# Patient Record
Sex: Female | Born: 1950 | Race: White | Hispanic: No | Smoking: Never smoker
Health system: Southern US, Community
[De-identification: ages and names within clinical notes are randomized; demographics above are authoritative.]

## PROBLEM LIST (undated history)

## (undated) DIAGNOSIS — I10 Essential (primary) hypertension: Secondary | ICD-10-CM

## (undated) DIAGNOSIS — E119 Type 2 diabetes mellitus without complications: Secondary | ICD-10-CM

## (undated) DIAGNOSIS — I639 Cerebral infarction, unspecified: Secondary | ICD-10-CM

## (undated) DIAGNOSIS — I499 Cardiac arrhythmia, unspecified: Secondary | ICD-10-CM

## (undated) DIAGNOSIS — D649 Anemia, unspecified: Secondary | ICD-10-CM

## (undated) DIAGNOSIS — N183 Chronic kidney disease, stage 3 unspecified: Secondary | ICD-10-CM

## (undated) DIAGNOSIS — F32A Depression, unspecified: Secondary | ICD-10-CM

## (undated) DIAGNOSIS — I4891 Unspecified atrial fibrillation: Secondary | ICD-10-CM

## (undated) DIAGNOSIS — K2270 Barrett's esophagus without dysplasia: Secondary | ICD-10-CM

## (undated) DIAGNOSIS — F419 Anxiety disorder, unspecified: Secondary | ICD-10-CM

## (undated) DIAGNOSIS — K7460 Unspecified cirrhosis of liver: Secondary | ICD-10-CM

## (undated) HISTORY — PX: TONSILLECTOMY: SUR1361

## (undated) HISTORY — PX: INTRAOCULAR LENS INSERTION: SHX110

## (undated) HISTORY — PX: TUBAL LIGATION: SHX77

## (undated) HISTORY — PX: EYE SURGERY: SHX253

## (undated) HISTORY — DX: Cerebral infarction, unspecified: I63.9

---

## 2008-01-08 ENCOUNTER — Other Ambulatory Visit: Admission: RE | Admit: 2008-01-08 | Discharge: 2008-01-08 | Payer: Self-pay | Admitting: Family Medicine

## 2008-05-03 ENCOUNTER — Other Ambulatory Visit: Admission: RE | Admit: 2008-05-03 | Discharge: 2008-05-03 | Payer: Self-pay | Admitting: Gynecology

## 2008-08-09 ENCOUNTER — Other Ambulatory Visit: Admission: RE | Admit: 2008-08-09 | Discharge: 2008-08-09 | Payer: Self-pay | Admitting: Gynecology

## 2008-08-09 ENCOUNTER — Encounter: Payer: Self-pay | Admitting: Gynecology

## 2008-08-09 ENCOUNTER — Ambulatory Visit: Payer: Self-pay | Admitting: Gynecology

## 2009-09-29 ENCOUNTER — Other Ambulatory Visit: Admission: RE | Admit: 2009-09-29 | Discharge: 2009-09-29 | Payer: Self-pay | Admitting: Family Medicine

## 2010-03-16 ENCOUNTER — Other Ambulatory Visit: Admission: RE | Admit: 2010-03-16 | Discharge: 2010-03-16 | Payer: Self-pay | Admitting: Family Medicine

## 2010-10-06 ENCOUNTER — Other Ambulatory Visit
Admission: RE | Admit: 2010-10-06 | Discharge: 2010-10-06 | Payer: Self-pay | Source: Home / Self Care | Admitting: Family Medicine

## 2013-05-15 ENCOUNTER — Other Ambulatory Visit (HOSPITAL_COMMUNITY)
Admission: RE | Admit: 2013-05-15 | Discharge: 2013-05-15 | Disposition: A | Discharge status: Home / Self Care | Payer: BC Managed Care – PPO | Source: Ambulatory Visit | Attending: Family Medicine | Admitting: Family Medicine

## 2013-05-15 ENCOUNTER — Other Ambulatory Visit: Payer: Self-pay | Admitting: Family Medicine

## 2013-05-15 DIAGNOSIS — Z1151 Encounter for screening for human papillomavirus (HPV): Secondary | ICD-10-CM | POA: Insufficient documentation

## 2013-05-15 DIAGNOSIS — Z124 Encounter for screening for malignant neoplasm of cervix: Secondary | ICD-10-CM | POA: Insufficient documentation

## 2014-10-11 ENCOUNTER — Other Ambulatory Visit: Payer: Self-pay | Admitting: Gastroenterology

## 2018-01-23 DIAGNOSIS — M1A9XX1 Chronic gout, unspecified, with tophus (tophi): Secondary | ICD-10-CM | POA: Insufficient documentation

## 2018-01-23 DIAGNOSIS — IMO0002 Reserved for concepts with insufficient information to code with codable children: Secondary | ICD-10-CM | POA: Insufficient documentation

## 2018-01-25 ENCOUNTER — Encounter (HOSPITAL_COMMUNITY): Payer: Self-pay | Admitting: Family Medicine

## 2018-01-25 ENCOUNTER — Inpatient Hospital Stay (HOSPITAL_COMMUNITY)
Admission: EM | Admit: 2018-01-25 | Discharge: 2018-01-28 | DRG: 065 | Disposition: A | Discharge status: Home / Self Care | Payer: Medicare Other | Attending: Family Medicine | Admitting: Family Medicine

## 2018-01-25 ENCOUNTER — Other Ambulatory Visit: Payer: Self-pay

## 2018-01-25 ENCOUNTER — Inpatient Hospital Stay (HOSPITAL_COMMUNITY): Payer: Medicare Other

## 2018-01-25 ENCOUNTER — Emergency Department (HOSPITAL_COMMUNITY): Payer: Medicare Other

## 2018-01-25 DIAGNOSIS — I639 Cerebral infarction, unspecified: Secondary | ICD-10-CM | POA: Diagnosis present

## 2018-01-25 DIAGNOSIS — E7439 Other disorders of intestinal carbohydrate absorption: Secondary | ICD-10-CM | POA: Diagnosis present

## 2018-01-25 DIAGNOSIS — H5340 Unspecified visual field defects: Secondary | ICD-10-CM | POA: Diagnosis present

## 2018-01-25 DIAGNOSIS — R29703 NIHSS score 3: Secondary | ICD-10-CM | POA: Diagnosis present

## 2018-01-25 DIAGNOSIS — H538 Other visual disturbances: Secondary | ICD-10-CM | POA: Diagnosis present

## 2018-01-25 DIAGNOSIS — R402142 Coma scale, eyes open, spontaneous, at arrival to emergency department: Secondary | ICD-10-CM | POA: Diagnosis present

## 2018-01-25 DIAGNOSIS — I4891 Unspecified atrial fibrillation: Secondary | ICD-10-CM | POA: Diagnosis not present

## 2018-01-25 DIAGNOSIS — I480 Paroxysmal atrial fibrillation: Secondary | ICD-10-CM | POA: Diagnosis present

## 2018-01-25 DIAGNOSIS — R531 Weakness: Secondary | ICD-10-CM

## 2018-01-25 DIAGNOSIS — G8192 Hemiplegia, unspecified affecting left dominant side: Secondary | ICD-10-CM | POA: Diagnosis present

## 2018-01-25 DIAGNOSIS — I119 Hypertensive heart disease without heart failure: Secondary | ICD-10-CM | POA: Diagnosis present

## 2018-01-25 DIAGNOSIS — R402362 Coma scale, best motor response, obeys commands, at arrival to emergency department: Secondary | ICD-10-CM | POA: Diagnosis present

## 2018-01-25 DIAGNOSIS — R402252 Coma scale, best verbal response, oriented, at arrival to emergency department: Secondary | ICD-10-CM | POA: Diagnosis present

## 2018-01-25 DIAGNOSIS — I10 Essential (primary) hypertension: Secondary | ICD-10-CM | POA: Diagnosis present

## 2018-01-25 DIAGNOSIS — I672 Cerebral atherosclerosis: Secondary | ICD-10-CM | POA: Diagnosis present

## 2018-01-25 DIAGNOSIS — E876 Hypokalemia: Secondary | ICD-10-CM | POA: Diagnosis not present

## 2018-01-25 DIAGNOSIS — D649 Anemia, unspecified: Secondary | ICD-10-CM | POA: Diagnosis present

## 2018-01-25 DIAGNOSIS — E785 Hyperlipidemia, unspecified: Secondary | ICD-10-CM | POA: Diagnosis present

## 2018-01-25 DIAGNOSIS — I63431 Cerebral infarction due to embolism of right posterior cerebral artery: Principal | ICD-10-CM | POA: Diagnosis present

## 2018-01-25 DIAGNOSIS — I340 Nonrheumatic mitral (valve) insufficiency: Secondary | ICD-10-CM | POA: Diagnosis not present

## 2018-01-25 DIAGNOSIS — Z6834 Body mass index (BMI) 34.0-34.9, adult: Secondary | ICD-10-CM | POA: Diagnosis not present

## 2018-01-25 HISTORY — DX: Essential (primary) hypertension: I10

## 2018-01-25 HISTORY — DX: Cerebral infarction, unspecified: I63.9

## 2018-01-25 LAB — COMPREHENSIVE METABOLIC PANEL
ALBUMIN: 3.6 g/dL (ref 3.5–5.0)
ALT: 11 U/L — ABNORMAL LOW (ref 14–54)
ANION GAP: 12 (ref 5–15)
AST: 24 U/L (ref 15–41)
Alkaline Phosphatase: 46 U/L (ref 38–126)
BILIRUBIN TOTAL: 0.8 mg/dL (ref 0.3–1.2)
BUN: 12 mg/dL (ref 6–20)
CO2: 21 mmol/L — AB (ref 22–32)
Calcium: 9.5 mg/dL (ref 8.9–10.3)
Chloride: 109 mmol/L (ref 101–111)
Creatinine, Ser: 1.03 mg/dL — ABNORMAL HIGH (ref 0.44–1.00)
GFR calc Af Amer: >60 mL/min (ref >60)
GFR calc non Af Amer: 55 mL/min — ABNORMAL LOW (ref >60)
Glucose, Bld: 114 mg/dL — ABNORMAL HIGH (ref 65–99)
POTASSIUM: 3.6 mmol/L (ref 3.5–5.1)
SODIUM: 142 mmol/L (ref 135–145)
Total Protein: 6.8 g/dL (ref 6.5–8.1)

## 2018-01-25 LAB — DIFFERENTIAL
BASOS ABS: 0 10*3/uL (ref 0.0–0.1)
Basophils Relative: 0 %
EOS ABS: 0 10*3/uL (ref 0.0–0.7)
EOS PCT: 0 %
LYMPHS ABS: 1.8 10*3/uL (ref 0.7–4.0)
LYMPHS PCT: 17 %
Monocytes Absolute: 0.5 10*3/uL (ref 0.1–1.0)
Monocytes Relative: 4 %
Neutro Abs: 8.4 10*3/uL — ABNORMAL HIGH (ref 1.7–7.7)
Neutrophils Relative %: 79 %

## 2018-01-25 LAB — URINALYSIS, ROUTINE W REFLEX MICROSCOPIC
BILIRUBIN URINE: NEGATIVE
GLUCOSE, UA: NEGATIVE mg/dL
HGB URINE DIPSTICK: NEGATIVE
KETONES UR: NEGATIVE mg/dL
NITRITE: NEGATIVE
PH: 6 (ref 5.0–8.0)
PROTEIN: NEGATIVE mg/dL
Specific Gravity, Urine: 1.01 (ref 1.005–1.030)

## 2018-01-25 LAB — PROTIME-INR
INR: 1.1
PROTHROMBIN TIME: 14.1 s (ref 11.4–15.2)

## 2018-01-25 LAB — CBC
HCT: 40 % (ref 36.0–46.0)
HEMOGLOBIN: 13 g/dL (ref 12.0–15.0)
MCH: 32.1 pg (ref 26.0–34.0)
MCHC: 32.5 g/dL (ref 30.0–36.0)
MCV: 98.8 fL (ref 78.0–100.0)
PLATELETS: 237 10*3/uL (ref 150–400)
RBC: 4.05 MIL/uL (ref 3.87–5.11)
RDW: 12.7 % (ref 11.5–15.5)
WBC: 10.7 10*3/uL — ABNORMAL HIGH (ref 4.0–10.5)

## 2018-01-25 LAB — ETHANOL: Alcohol, Ethyl (B): <10 mg/dL (ref <10)

## 2018-01-25 LAB — I-STAT TROPONIN, ED: Troponin i, poc: 0.02 ng/mL (ref 0.00–0.08)

## 2018-01-25 LAB — APTT: APTT: 29 s (ref 24–36)

## 2018-01-25 MED ORDER — ATORVASTATIN CALCIUM 40 MG PO TABS
40.0000 mg | ORAL_TABLET | Freq: Every day | ORAL | Status: DC
  Administered 2018-01-26: 40 mg via ORAL
  Filled 2018-01-25: qty 1

## 2018-01-25 MED ORDER — ASPIRIN 300 MG RE SUPP: 300.0000 mg | Freq: Every day | RECTAL | Status: DC

## 2018-01-25 MED ORDER — ENOXAPARIN SODIUM 40 MG/0.4ML ~~LOC~~ SOLN
40.0000 mg | Freq: Every day | SUBCUTANEOUS | Status: DC
  Filled 2018-01-25: qty 0.4

## 2018-01-25 MED ORDER — SODIUM CHLORIDE 0.9 % IV SOLN
INTRAVENOUS | Status: AC
  Administered 2018-01-26: 01:00:00 via INTRAVENOUS

## 2018-01-25 MED ORDER — ACETAMINOPHEN 650 MG RE SUPP: 650.0000 mg | RECTAL | Status: DC | PRN

## 2018-01-25 MED ORDER — SODIUM CHLORIDE 0.9 % IV BOLUS
1000.0000 mL | Freq: Once | INTRAVENOUS | Status: AC
  Administered 2018-01-25: 1000 mL via INTRAVENOUS

## 2018-01-25 MED ORDER — ACETAMINOPHEN 325 MG PO TABS: 650.0000 mg | ORAL_TABLET | ORAL | Status: DC | PRN

## 2018-01-25 MED ORDER — ACETAMINOPHEN 160 MG/5ML PO SOLN: 650.0000 mg | ORAL | Status: DC | PRN

## 2018-01-25 MED ORDER — DILTIAZEM HCL 25 MG/5ML IV SOLN
5.0000 mg | Freq: Once | INTRAVENOUS | Status: AC
  Administered 2018-01-25: 5 mg via INTRAVENOUS
  Filled 2018-01-25: qty 5

## 2018-01-25 MED ORDER — SENNOSIDES-DOCUSATE SODIUM 8.6-50 MG PO TABS: 1.0000 | ORAL_TABLET | Freq: Every evening | ORAL | Status: DC | PRN

## 2018-01-25 MED ORDER — ADULT MULTIVITAMIN W/MINERALS CH
1.0000 | ORAL_TABLET | Freq: Every day | ORAL | Status: DC
  Administered 2018-01-26 – 2018-01-28 (×3): 1 via ORAL
  Filled 2018-01-25 (×3): qty 1

## 2018-01-25 MED ORDER — DILTIAZEM HCL 25 MG/5ML IV SOLN: 5.0000 mg | Freq: Once | INTRAVENOUS | Status: DC

## 2018-01-25 MED ORDER — ASPIRIN 325 MG PO TABS
325.0000 mg | ORAL_TABLET | Freq: Every day | ORAL | Status: DC
  Administered 2018-01-26: 325 mg via ORAL
  Filled 2018-01-25: qty 1

## 2018-01-25 MED ORDER — DILTIAZEM HCL-DEXTROSE 100-5 MG/100ML-% IV SOLN (PREMIX)
5.0000 mg/h | INTRAVENOUS | Status: DC
  Administered 2018-01-26: 10 mg/h via INTRAVENOUS
  Administered 2018-01-26: 7.5 mg/h via INTRAVENOUS
  Administered 2018-01-26: 5 mg/h via INTRAVENOUS
  Administered 2018-01-27 (×2): 10 mg/h via INTRAVENOUS
  Filled 2018-01-25 (×7): qty 100

## 2018-01-25 MED ORDER — STROKE: EARLY STAGES OF RECOVERY BOOK
Freq: Once | Status: DC
  Filled 2018-01-25: qty 1

## 2018-01-25 NOTE — ED Notes (Signed)
Pt in MRI at this time 

## 2018-01-25 NOTE — H&P (Signed)
History and Physical    Diane Pruitt ZOX:[id number] DOB: [birth year] DOA: 01/25/2018  PCP: [name], MD   Patient coming from: Home  Chief Complaint: Left-sided weakness   HPI: Diane Pruitt is a 67 y.o. female with medical history significant for hypertension and hyperlipidemia, now presenting to the emergency department for evaluation of left-sided weakness.  Patient reports that she went to bed in her usual state of health, but woke at approximately 2 AM with a headache which is unusual for her.  She noted some mild blurring of her vision at that time as well.  Patient then noted left-sided weakness beginning at approximately 4 AM.  She reports history of intermittent palpitations going back months, but less than a year.  She denies any recent fall or trauma and denies experiencing these type of symptoms previously.  She denies history of arrhythmia.  Denies chest pain, fevers, chills, shortness of breath, or cough.  ED Course: Upon arrival to the ED, patient is found to be afebrile, saturating well on room air, tachycardic with rate 140, and blood pressure stable.  EKG features atrial fibrillation with RVR, rate 139, and right axis deviation.  Chest x-ray is notable for cardiomegaly without overt edema.  Noncontrast head CT reveals asymmetric hypoattenuation within the right PCA territory concerning for acute subacute CVA without hemorrhage or mass-effect.  Chemistry panel is notable for a creatinine of 1.03 and CBC features a slight leukocytosis.  Patient was given a liter of normal saline, 5 mg IV push of diltiazem, and neurology was consulted by the ED physician.  Neurology recommends a medical admission.  Patient heart rate remains rapid, diltiazem infusion will be started, and she will be admitted to the stepdown unit for ongoing evaluation and management of acute to subacute ischemic stroke with new onset atrial fibrillation with RVR.   Review of Systems:  All other systems  reviewed and apart from HPI, are negative.  History reviewed. No pertinent past medical history.  History reviewed. No pertinent surgical history.   has no tobacco, alcohol, and drug history on file.  No Known Allergies  History reviewed. No pertinent family history.   Prior to Admission medications   Medication Sig Start Date End Date Taking? Authorizing Provider  acetaminophen (TYLENOL) 500 MG tablet Take 1,000 mg by mouth every 6 (six) hours as needed for mild pain or headache.   Yes [provider]  atorvastatin (LIPITOR) 40 MG tablet Take 40 mg by mouth daily.   Yes [provider]  lisinopril-hydrochlorothiazide (PRINZIDE,ZESTORETIC) 20-25 MG tablet Take 1 tablet by mouth daily.   Yes [provider]  pediatric multivitamin-iron (POLY-VI-SOL WITH IRON) 15 MG chewable tablet Chew 2 tablets by mouth daily.   Yes [provider]    Physical Exam: Vitals:   01/25/18 2030 01/25/18 2100 01/25/18 2130 01/25/18 2200  BP: 120/83 (!) 125/107 (!) 127/95 (!) 120/100  Pulse: (!) 107 66 (!) 56 91  Resp: 18 (!) 26 20 (!) 28  Temp:      TempSrc:      SpO2: 97% 95% 99% 97%  Weight:      Height:          Constitutional: NAD, calm  Eyes: PERTLA, lids and conjunctivae normal ENMT: Mucous membranes are moist. Posterior pharynx clear of any exudate or lesions.   Neck: normal, supple, no masses, no thyromegaly Respiratory: clear to auscultation bilaterally, no wheezing, no crackles. Normal respiratory effort.    Cardiovascular: Rate ~120 and irregular. Trace pretibial  edema. Abdomen: No distension, no tenderness, soft. Bowel sounds normal.  Musculoskeletal: no clubbing / cyanosis. No joint deformity upper and lower extremities.   Skin: no significant rashes, lesions, ulcers. Warm, dry, well-perfused. Neurologic: No facial asymetry. PERRL, EOMI. Loss of sensation at left cheek. Pronator drift on left.  Psychiatric: Alert and oriented x 3. Pleasant,  cooperative.     Labs on Admission: I have personally reviewed following labs and imaging studies  CBC: Recent Labs  Lab 01/25/18 1938  WBC 10.7*  NEUTROABS 8.4*  HGB 13.0  HCT 40.0  MCV 98.8  PLT 468   Basic Metabolic Panel: Recent Labs  Lab 01/25/18 1938  NA 142  K 3.6  CL 109  CO2 21*  GLUCOSE 114*  BUN 12  CREATININE 1.03*  CALCIUM 9.5   GFR: Estimated Creatinine Clearance: 54.7 mL/min (A) (by C-G formula based on SCr of 1.03 mg/dL (H)). Liver Function Tests: Recent Labs  Lab 01/25/18 1938  AST 24  ALT 11*  ALKPHOS 46  BILITOT 0.8  PROT 6.8  ALBUMIN 3.6   No results for input(s): LIPASE, AMYLASE in the last 168 hours. No results for input(s): AMMONIA in the last 168 hours. Coagulation Profile: Recent Labs  Lab 01/25/18 1938  INR 1.10   Cardiac Enzymes: No results for input(s): CKTOTAL, CKMB, CKMBINDEX, TROPONINI in the last 168 hours. BNP (last 3 results) No results for input(s): PROBNP in the last 8760 hours. HbA1C: No results for input(s): HGBA1C in the last 72 hours. CBG: No results for input(s): GLUCAP in the last 168 hours. Lipid Profile: No results for input(s): CHOL, HDL, LDLCALC, TRIG, CHOLHDL, LDLDIRECT in the last 72 hours. Thyroid Function Tests: No results for input(s): TSH, T4TOTAL, FREET4, T3FREE, THYROIDAB in the last 72 hours. Anemia Panel: No results for input(s): VITAMINB12, FOLATE, FERRITIN, TIBC, IRON, RETICCTPCT in the last 72 hours. Urine analysis: No results found for: COLORURINE, APPEARANCEUR, LABSPEC, PHURINE, GLUCOSEU, HGBUR, BILIRUBINUR, KETONESUR, PROTEINUR, UROBILINOGEN, NITRITE, LEUKOCYTESUR Sepsis Labs: @LABRCNTIP (procalcitonin:4,lacticidven:4) )No results found for this or any previous visit (from the past 240 hour(s)).   Radiological Exams on Admission: Dg Chest 1 View  Result Date: 01/25/2018 CLINICAL DATA:  Headache and left-sided weakness. Atrial fibrillation. EXAM: CHEST  1 VIEW COMPARISON:  None.  FINDINGS: Cardiomegaly without overt pulmonary edema. No pleural effusion or pneumothorax. No focal airspace consolidation. IMPRESSION: Cardiomegaly without overt pulmonary edema. Electronically Signed   By: Ulyses Jarred M.D.   On: 01/25/2018 19:45   Ct Head Wo Contrast  Result Date: 01/25/2018 CLINICAL DATA:  Headache and left-sided weakness EXAM: CT HEAD WITHOUT CONTRAST TECHNIQUE: Contiguous axial images were obtained from the base of the skull through the vertex without intravenous contrast. COMPARISON:  None. FINDINGS: Brain: Asymmetric hypoattenuation within the right PCA distribution, involving the right occipital and temporal lobes. No acute hemorrhage. No midline shift or mass effect. Normal appearance of the brain parenchyma and extra axial spaces for age. Vascular: No hyperdense vessel or unexpected vascular calcification. Skull: Normal visualized skull base, calvarium and extracranial soft tissues. Sinuses/Orbits: No sinus fluid levels or advanced mucosal thickening. No mastoid effusion. Normal orbits. IMPRESSION: Asymmetric hypoattenuation within the right posterior cerebral artery territory suggest acute to subacute infarct. MRI is recommended for confirmation. No hemorrhage or mass effect. Electronically Signed   By: Ulyses Jarred M.D.   On: 01/25/2018 19:49    EKG: Independently reviewed. Atrial fibrillation with RVR (139).   Assessment/Plan   1. Ischemic CVA  - Presents after a headache at ~  2 am last night, then left-sided weakness noted ~4 am  - She is found to be in atrial fibrillation with RVR and reports months of intermittent palpitations  - Headache resolved, but continues to have some left-sided weakness and perioral numbness on left  - Head CT reveals asymmetric hypoattenuation in left PCA territory without hemorrhage or mass-effect  - Neurology is consulting and much appreciated - Check MRI brain, MRA head, carotid dopplers, echocardiogram, fasting lipids, and A1c  - She  passed swallow eval in ED   - Continue cardiac monitoring, frequent neuro checks, PT/OT/SLP consultation, high-intensity statin   2. New-onset atrial fibrillation with RVR  - Presents with acute neurologic deficits, found to be in atrial fibrillation with RVR  - CHADS-VASc at least 6 (age, gender, HTN, HLD, CVA x2)  - Rate still 110-150 following 5 mg IVP diltiazem  - Continue cardiac monitoring, start diltiazem infusion and titrate for rate 60-110  - May need to delay anticoagulation to prevent hemorrhagic conversion, will defer to neurology    3. Hypertension  - BP at goal  - Hold lisinopril-HCTZ for now in acute-phase ichemic CVA, may need to be replaced by rate-control agent     DVT prophylaxis: Lovenox  Code Status: Full  Family Communication: Discussed with patient  Consults called: Neurology Admission status: Inpatient    Vianne Bulls, MD [facility] Pager [phone]  If 7PM-7AM, please contact night-coverage [email or web address] Password [facility]  01/25/2018, 10:22 PM

## 2018-01-25 NOTE — ED Notes (Signed)
Patient transported to MRI 

## 2018-01-25 NOTE — ED Provider Notes (Addendum)
[facility] EMERGENCY DEPARTMENT Provider Note   CSN: [id number] Arrival date & time: 01/25/18  1828     History   Chief Complaint Chief Complaint  Patient presents with  . Weakness    HPI Diane Pruitt is a 67 y.o. female with a history of hypertension and hyperlipidemia who arrives to the emergency department via EMS for left-sided weakness which started at 0400 this morning.  Patient states that she went to bed normal last night.  States that she woke up around 0200 to use the restroom and noted that she had a headache with some trouble seeing in her left visual field.  She states she took some Tylenol was able to go back to bed. Headaches are atypical for her.  States she woke up again at 0400 and the headache and visual problems have resolved.  However at this time she did note some left upper and lower extremity weakness as well as some left-sided facial numbness/abnormal sensation.  She further describes the weakness on her left side as trouble ambulating as well as dropping of things I her left hand.  Patient states that the numbness and weakness have been persistent since onset.  States that they feel somewhat improved, however she is not completely sure.  She called EMS due to her symptoms.   Upon speaking with the EMS-patient negative with her stroke screen due to being outside the window, no focal deficits noted by EMS- they did however note some LLE lag with attempted ambulation. Patient noted to be in afib with RVR with heart rate ranging 110-180, she has no history of atrial fibrillation.  She was given 10 mg of metoprolol prior to arrival without significant change.  Patient states that she was unaware of that her heart rate.  She does state however she has had intermittent palpitations described as her heart racing for the past 6 months-she did not however noticed this today.  Denies chest pain or dyspnea.  HPI  History reviewed. No pertinent past medical  history.  There are no active problems to display for this patient.   History reviewed. No pertinent surgical history.   OB History   None     Home Medications    Prior to Admission medications   Medication Sig Start Date End Date Taking? Authorizing Provider  acetaminophen (TYLENOL) 500 MG tablet Take 1,000 mg by mouth every 6 (six) hours as needed for mild pain or headache.   Yes [provider]  atorvastatin (LIPITOR) 40 MG tablet Take 40 mg by mouth daily.   Yes [provider]  lisinopril-hydrochlorothiazide (PRINZIDE,ZESTORETIC) 20-25 MG tablet Take 1 tablet by mouth daily.   Yes [provider]  pediatric multivitamin-iron (POLY-VI-SOL WITH IRON) 15 MG chewable tablet Chew 2 tablets by mouth daily.   Yes [provider]    Family History History reviewed. No pertinent family history.  Social History Social History   Tobacco Use  . Smoking status: Not on file  Substance Use Topics  . Alcohol use: Not on file  . Drug use: Not on file     Allergies   Patient has no known allergies.   Review of Systems Review of Systems  Constitutional: Negative for chills and fever.  Eyes: Positive for visual disturbance (resolved at present).  Respiratory: Negative for cough and shortness of breath.   Cardiovascular: Negative for chest pain and palpitations.  Gastrointestinal: Negative for abdominal pain, diarrhea, nausea and vomiting.  Neurological: Positive for weakness, numbness  and headaches (resolved at present).  All other systems reviewed and are negative.   Physical Exam Updated Vital Signs BP (!) 118/93   Pulse (!) 137   Temp 98.2 F (36.8 C) (Oral)   Resp 17   SpO2 99%   Physical Exam  Constitutional: She appears well-developed and well-nourished. No distress.  HENT:  Head: Normocephalic and atraumatic.  Eyes: Conjunctivae are normal. Right eye exhibits no discharge. Left eye exhibits no discharge.  Cardiovascular: An  irregularly irregular rhythm present. Tachycardia present.  Pulmonary/Chest: Breath sounds normal. No respiratory distress. She has no wheezes. She has no rales.  Abdominal: Soft. She exhibits no distension. There is no tenderness.  Neurological:  Alert. Clear speech. No facial droop. CNIII-XII are grossly intact. Bilateral upper and lower extremities' sensation intact to sharp and dull touch. 5/5 grip strength bilaterally. 5/5 plantar and dorsi flexion bilaterally. Patient without obvious deficit with finger to nose however states this is more challenging with the LUE. Normal heel to shin bilaterally. Negative pronator drift.   Skin: Skin is warm and dry. No rash noted.  Psychiatric: She has a normal mood and affect. Her behavior is normal.  Nursing note and vitals reviewed.   ED Treatments / Results  Labs Results for orders placed or performed during the hospital encounter of 01/25/18  Ethanol  Result Value Ref Range   Alcohol, Ethyl (B) <10 <10 mg/dL  Protime-INR  Result Value Ref Range   Prothrombin Time 14.1 11.4 - 15.2 seconds   INR 1.10   APTT  Result Value Ref Range   aPTT 29 24 - 36 seconds  CBC  Result Value Ref Range   WBC 10.7 (H) 4.0 - 10.5 K/uL   RBC 4.05 3.87 - 5.11 MIL/uL   Hemoglobin 13.0 12.0 - 15.0 g/dL   HCT 40.0 36.0 - 46.0 %   MCV 98.8 78.0 - 100.0 fL   MCH 32.1 26.0 - 34.0 pg   MCHC 32.5 30.0 - 36.0 g/dL   RDW 12.7 11.5 - 15.5 %   Platelets 237 150 - 400 K/uL  Differential  Result Value Ref Range   Neutrophils Relative % 79 %   Neutro Abs 8.4 (H) 1.7 - 7.7 K/uL   Lymphocytes Relative 17 %   Lymphs Abs 1.8 0.7 - 4.0 K/uL   Monocytes Relative 4 %   Monocytes Absolute 0.5 0.1 - 1.0 K/uL   Eosinophils Relative 0 %   Eosinophils Absolute 0.0 0.0 - 0.7 K/uL   Basophils Relative 0 %   Basophils Absolute 0.0 0.0 - 0.1 K/uL  Comprehensive metabolic panel  Result Value Ref Range   Sodium 142 135 - 145 mmol/L   Potassium 3.6 3.5 - 5.1 mmol/L   Chloride  109 101 - 111 mmol/L   CO2 21 (L) 22 - 32 mmol/L   Glucose, Bld 114 (H) 65 - 99 mg/dL   BUN 12 6 - 20 mg/dL   Creatinine, Ser 1.03 (H) 0.44 - 1.00 mg/dL   Calcium 9.5 8.9 - 10.3 mg/dL   Total Protein 6.8 6.5 - 8.1 g/dL   Albumin 3.6 3.5 - 5.0 g/dL   AST 24 15 - 41 U/L   ALT 11 (L) 14 - 54 U/L   Alkaline Phosphatase 46 38 - 126 U/L   Total Bilirubin 0.8 0.3 - 1.2 mg/dL   GFR calc non Af Amer 55 (L) >60 mL/min   GFR calc Af Amer >60 >60 mL/min   Anion gap 12 5 - 15  I-stat troponin, ED  Result Value Ref Range   Troponin i, poc 0.02 0.00 - 0.08 ng/mL   Comment 3            EKG EKG Interpretation  Date/Time:  Saturday January 25 2018 18:46:25 EDT Ventricular Rate:  139 PR Interval:    QRS Duration: 67 QT Interval:  315 QTC Calculation: 479 R Axis:   98 Text Interpretation:  Atrial fibrillation Right axis deviation Low voltage, precordial leads Anteroseptal infarct, old Confirmed by Dene Gentry [phone]) on 01/25/2018 6:53:46 PM   Radiology Dg Chest 1 View  Result Date: 01/25/2018 CLINICAL DATA:  Headache and left-sided weakness. Atrial fibrillation. EXAM: CHEST  1 VIEW COMPARISON:  None. FINDINGS: Cardiomegaly without overt pulmonary edema. No pleural effusion or pneumothorax. No focal airspace consolidation. IMPRESSION: Cardiomegaly without overt pulmonary edema. Electronically Signed   By: Ulyses Jarred M.D.   On: 01/25/2018 19:45   Ct Head Wo Contrast  Result Date: 01/25/2018 CLINICAL DATA:  Headache and left-sided weakness EXAM: CT HEAD WITHOUT CONTRAST TECHNIQUE: Contiguous axial images were obtained from the base of the skull through the vertex without intravenous contrast. COMPARISON:  None. FINDINGS: Brain: Asymmetric hypoattenuation within the right PCA distribution, involving the right occipital and temporal lobes. No acute hemorrhage. No midline shift or mass effect. Normal appearance of the brain parenchyma and extra axial spaces for age. Vascular: No hyperdense vessel  or unexpected vascular calcification. Skull: Normal visualized skull base, calvarium and extracranial soft tissues. Sinuses/Orbits: No sinus fluid levels or advanced mucosal thickening. No mastoid effusion. Normal orbits. IMPRESSION: Asymmetric hypoattenuation within the right posterior cerebral artery territory suggest acute to subacute infarct. MRI is recommended for confirmation. No hemorrhage or mass effect. Electronically Signed   By: Ulyses Jarred M.D.   On: 01/25/2018 19:49    Procedures Procedures (including critical care time)  Medications Ordered in ED Medications  diltiazem (CARDIZEM) injection 5 mg (has no administration in time range)  diltiazem (CARDIZEM) injection 5 mg (5 mg Intravenous Given 01/25/18 2020)  sodium chloride 0.9 % bolus 1,000 mL (1,000 mLs Intravenous New Bag/Given 01/25/18 2019)    Initial Impression / Assessment and Plan / ED Course  I have reviewed the triage vital signs and the nursing notes.  Pertinent labs & imaging results that were available during my care of the patient were reviewed by me and considered in my medical decision making (see chart for details).   Patient presents with L facial numbness and LUE/LLE weakness. Patient is nontoxic appearing, noted to be tachycardic upon arrival. Patient placed on cardiac monitor appears to be in afib with RVR- this is new for the patient. On initial exam she is without obvious focal neurologic deficits, did not get patient up to ambulate during initial evaluation- EMS reports some difficulty with ambulating due to LLE weakness. Given onset of sxs > 12 hours ago Code stroke was not activated upon arrival. Stroke order set utilized to initiate evaluation. Will give 5mg  of Cardizem and fluids for rate control.   19:10: CONSULT: Discussed case with neurologist Dr. Cheral Marker, discussed medicine admission, neurology will consult.   Labs reviewed and fairly unremarkable. Nonspecific leukocytosis noted. Troponin WNL. No  significant electrolyte abnormalities. EKG consistent with afib with RVR. CXR revealed cardiomegaly without overt pulmonary edema.  CT head findings suggestive for acute to subacute infarct with recommended MRI. MRI has been ordered.   21:50: RE-EVAL: Patient HR remaining >100 while I am in the room, HR increases as high as 130,  will give additional dose of cardizem and plan for admission.   22:08: CONSULT: Discussed case with hospitalist Dr. Myna Hidalgo who accepts admission.    Findings and plan of care discussed with supervising physician Dr.Messick who is in agreement with plan.   Final Clinical Impressions(s) / ED Diagnoses   Final diagnoses:  Weakness  Atrial fibrillation, unspecified type [facility])    ED Discharge Orders    None       Leafy Kindle 01/25/18 2227    Valarie Merino, MD 01/26/18 1145  CRITICAL CARE Performed by: Kennith Maes   Total critical care time: 38 minutes  Critical care time was exclusive of separately billable procedures and treating other patients.  Critical care was necessary to treat or prevent imminent or life-threatening deterioration.  Critical care was time spent personally by me on the following activities: development of treatment plan with patient and/or surrogate as well as nursing, discussions with consultants, evaluation of patient's response to treatment, examination of patient, obtaining history from patient or surrogate, ordering and performing treatments and interventions, ordering and review of laboratory studies, ordering and review of radiographic studies, pulse oximetry and re-evaluation of patient's condition.    Leafy Kindle 02/10/18 1344    Valarie Merino, MD 02/11/18 [id number]

## 2018-01-25 NOTE — Progress Notes (Signed)
Tried to call and get RN for Patient to find out information.  Kept getting transferred to wrong RN.  Will just send for patient when scanner is available.

## 2018-01-25 NOTE — Consult Note (Signed)
Referring Physician: Dr. Francia Greaves    Chief Complaint: Left sided weakness  HPI: Diane Pruitt is an 67 y.o. female who presented to the ED via EMS for assessment of left arm and leg weakness. She states that she woke up in the middle of the night on Friday with a severe headache and noticed she was dragging her left leg when walking. She went back to sleep and woke up at about 5 PM on Saturday. She then called EMS. Per EMS their stroke screen was negative; the patient was of window for tPA. EMS noted that the patient was in atrial fibrillation with RVR. She has no prior history of MI or stroke.   CT head obtained in the ED showed asymmetric hypoattenuation within the right posterior cerebral artery territory suggestive of acute to subacute infarct; no hemorrhage or mass effect. MRI head was subsequently obtained, confirming new stroke.  MRI brain: Acute nonhemorrhagic RIGHT PCA territory infarcts (RIGHT temporooccipital lobe and RIGHT thalamus).  MRA head:  1. Acute RIGHT posterior cerebral artery occlusion. 2. Mild intracranial atherosclerosis.  No past medical history on file.  No family history on file. Social History:  has no tobacco, alcohol, and drug history on file.  Allergies: No Known Allergies  Home Medications: Tylenol  Lipitor Prinizide MVI with iron  Inpatient Medications:  Scheduled: .  stroke: mapping our early stages of recovery book   Does not apply Once  . aspirin  300 mg Rectal Daily   Or  . aspirin  325 mg Oral Daily  . atorvastatin  40 mg Oral q1800  . enoxaparin (LOVENOX) injection  40 mg Subcutaneous Daily  . multivitamin with minerals  1 tablet Oral Daily   Continuous: . sodium chloride 75 mL/hr at 01/26/18 0101  . diltiazem (CARDIZEM) infusion 7.5 mg/hr (01/26/18 0200)    ROS: Not currently with headache. No confusion, speech deficit, dysphagia, CP, N/V, abdominal pain or limb pain.   Physical Examination: Blood pressure (!) 118/93, pulse (!) 137,  temperature 98.2 F (36.8 C), temperature source Oral, resp. rate 17, SpO2 99 %.  HEENT: [place]/AT Lungs: Mild wheezing is grossly audible Ext: No edema  Neurologic Examination: Mental Status:  Alert and fully oriented, thought content appropriate. Speech fluent without evidence of aphasia.  Able to follow all commands without difficulty. Cranial Nerves: II:  Crescentic visual field cut upper and lower quadrants on the left OU. PERRL.  III,IV, VI: EOMI without nystagmus. No ptosis.  V,VII: No facial droop. Facial temp sensation equal bilaterally VIII: hearing intact to conversation IX,X: No hypophonia or hoarseness XI: Symmetric XII: midline tongue extension  Motor: Right : Upper extremity   5/5    Left:     Upper extremity   5/5  Lower extremity   5/5     Lower extremity   5/5 Normal tone throughout; no atrophy noted No pronator drift Sensory: Temp and light touch intact bilaterally. No extinction.  Deep Tendon Reflexes:  Right brachioradialis and biceps: 1+ Left brachioradialis and biceps: 2+ Patellae 1+ on right, 2+ on left Achilles 2+ on right, 0 on left Toes downgoing bilaterally Cerebellar: No ataxia with FNF bilaterally  Gait: Deferred  No results found for this or any previous visit (from the past 48 hour(s)). No results found.  Assessment: 67 y.o. female with subacute right PCA territory ischemic infarction.  1. Exam reveals crescentic left visual field cut OU.  2. MRI reveals right temporo-occipital lobe infarction and small right thalamic infarction.   Recommendations: 1.  HgbA1c, fasting lipid panel 2. TTE 3. Carotid ultrasound 4. PT consult, OT consult, Speech consult 5. Agree with starting ASA for now. Will likely need to be switched to an oral anticoagulant within the next few days. Stroke is relatively large in size, so at risk for hemorrhagic transformation over the next several days. Also at some risk for complications due to mass effect if significant edema  develops.  6. Gentle IV hydration. Encourage PO.  7. Continue atorvastatin 8. Telemetry monitoring 9. Frequent neuro checks 10. Management of HR in the setting of acute atrial fibrillation with RVR.  11. Will need driving assessment prior to resuming driving: At risk for MVA given left crescentic visual field cut. Discussed with patient.   @Electronically  signed: Dr. Kerney Elbe  01/25/2018, 7:19 PM

## 2018-01-25 NOTE — ED Notes (Signed)
This RN ambulated pt to restroom with difficulty.  Pt used side rail and had altered gait, reported difficulty with L foot.

## 2018-01-25 NOTE — ED Triage Notes (Signed)
Pt brought in by [facility] from home for left sided weakness. Pt states she woke up in the middle of the night with a headache and noticed she was dragging her left walk in order to walk. Per EMS their stroke screen was negative and pt was outside of window. EMS noted pt in afib w/ rvr, hr 110-180 with no hx. No hx of MI or stroke in the past. Pt given 10mg  metoprolol PTA with no change. EDP at bedside. Pt A+Ox4 at this time and in no apparent distress.

## 2018-01-26 ENCOUNTER — Inpatient Hospital Stay (HOSPITAL_COMMUNITY): Payer: Medicare Other

## 2018-01-26 DIAGNOSIS — I639 Cerebral infarction, unspecified: Secondary | ICD-10-CM

## 2018-01-26 DIAGNOSIS — I340 Nonrheumatic mitral (valve) insufficiency: Secondary | ICD-10-CM

## 2018-01-26 LAB — BASIC METABOLIC PANEL
Anion gap: 10 (ref 5–15)
BUN: 11 mg/dL (ref 6–20)
CALCIUM: 9 mg/dL (ref 8.9–10.3)
CO2: 19 mmol/L — ABNORMAL LOW (ref 22–32)
Chloride: 112 mmol/L — ABNORMAL HIGH (ref 101–111)
Creatinine, Ser: 0.99 mg/dL (ref 0.44–1.00)
GFR calc Af Amer: >60 mL/min (ref >60)
GFR, EST NON AFRICAN AMERICAN: 58 mL/min — AB (ref >60)
GLUCOSE: 105 mg/dL — AB (ref 65–99)
POTASSIUM: 3 mmol/L — AB (ref 3.5–5.1)
SODIUM: 141 mmol/L (ref 135–145)

## 2018-01-26 LAB — HEMOGLOBIN A1C
HEMOGLOBIN A1C: 5 % (ref 4.8–5.6)
Mean Plasma Glucose: 96.8 mg/dL

## 2018-01-26 LAB — ECHOCARDIOGRAM COMPLETE
HEIGHTINCHES: 62 in
Weight: 3040 oz

## 2018-01-26 LAB — LIPID PANEL
Cholesterol: 175 mg/dL (ref 0–200)
HDL: 36 mg/dL — ABNORMAL LOW (ref >40)
LDL Cholesterol: 97 mg/dL (ref 0–99)
TRIGLYCERIDES: 212 mg/dL — AB (ref <150)
Total CHOL/HDL Ratio: 4.9 RATIO
VLDL: 42 mg/dL — AB (ref 0–40)

## 2018-01-26 LAB — CBC
HEMATOCRIT: 36.9 % (ref 36.0–46.0)
Hemoglobin: 11.9 g/dL — ABNORMAL LOW (ref 12.0–15.0)
MCH: 31.4 pg (ref 26.0–34.0)
MCHC: 32.2 g/dL (ref 30.0–36.0)
MCV: 97.4 fL (ref 78.0–100.0)
PLATELETS: 231 10*3/uL (ref 150–400)
RBC: 3.79 MIL/uL — ABNORMAL LOW (ref 3.87–5.11)
RDW: 12.8 % (ref 11.5–15.5)
WBC: 9.4 10*3/uL (ref 4.0–10.5)

## 2018-01-26 LAB — HEPARIN LEVEL (UNFRACTIONATED): HEPARIN UNFRACTIONATED: 0.32 [IU]/mL (ref 0.30–0.70)

## 2018-01-26 LAB — MRSA PCR SCREENING: MRSA BY PCR: NEGATIVE

## 2018-01-26 MED ORDER — HEPARIN (PORCINE) IN NACL 100-0.45 UNIT/ML-% IJ SOLN
950.0000 [IU]/h | INTRAMUSCULAR | Status: AC
  Administered 2018-01-26: 950 [IU]/h via INTRAVENOUS
  Filled 2018-01-26: qty 250

## 2018-01-26 MED ORDER — POTASSIUM CHLORIDE CRYS ER 20 MEQ PO TBCR
20.0000 meq | EXTENDED_RELEASE_TABLET | Freq: Three times a day (TID) | ORAL | Status: DC
  Administered 2018-01-26 (×3): 20 meq via ORAL
  Filled 2018-01-26 (×3): qty 1

## 2018-01-26 NOTE — Progress Notes (Signed)
*  PRELIMINARY RESULTS* Echocardiogram 2D Echocardiogram has been performed.  Leavy Cella 01/26/2018, 3:34 PM

## 2018-01-26 NOTE — ED Notes (Addendum)
Patient transported to Ultrasound (ECHO)

## 2018-01-26 NOTE — ED Notes (Signed)
Attempted report x1. 

## 2018-01-26 NOTE — Consult Note (Signed)
Reason for Consult:new-onset A. Fib with RVR Referring Physician: [facility] hospitalist  Diane Pruitt is an 67 y.o. female.  HPI: patient is 67 year old female with past medical history significant for hypertension, hyperlipidemia, morbid obesity, was admitted yesterday because of left-sided facial numbness associated with left arm and leg weakness which started Friday night associated with headache did not seek any medical attention had similar symptoms yesterday afternoon with mild blurring of vision so decided to come to ED. Patient complains of palpitations off and on but did not seek any medical attention states she has been feeling tired fatigued for almost last 1 month. Patient was noted to be in A. Fib with RVR. Patient denies any cardiac workup in the past. CT and MRI of the brain showed acute right PCA infarct and was noted to have occlusion of the right posterior cerebral artery. Patient was started on Cardizem IV with control of her heart rate and low-dose heparin which she is tolerating it well. Her left arm weakness and left leg weakness has almost resolved.  History reviewed. No pertinent past medical history.  History reviewed. No pertinent surgical history.  History reviewed. No pertinent family history.  Social History:  has no tobacco, alcohol, and drug history on file.  Allergies: No Known Allergies  Medications: I have reviewed the patient's current medications.  Results for orders placed or performed during the hospital encounter of 01/25/18 (from the past 48 hour(s))  Urinalysis, Routine w reflex microscopic     Status: Abnormal   Collection Time: 01/25/18  6:56 PM  Result Value Ref Range   Color, Urine YELLOW YELLOW   APPearance CLEAR CLEAR   Specific Gravity, Urine 1.010 1.005 - 1.030   pH 6.0 5.0 - 8.0   Glucose, UA NEGATIVE NEGATIVE mg/dL   Hgb urine dipstick NEGATIVE NEGATIVE   Bilirubin Urine NEGATIVE NEGATIVE   Ketones, ur NEGATIVE NEGATIVE mg/dL   Protein,  ur NEGATIVE NEGATIVE mg/dL   Nitrite NEGATIVE NEGATIVE   Leukocytes, UA TRACE (A) NEGATIVE   RBC / HPF 0-5 0 - 5 RBC/hpf   WBC, UA 0-5 0 - 5 WBC/hpf   Bacteria, UA RARE (A) NONE SEEN   Squamous Epithelial / LPF 0-5 (A) NONE SEEN   Mucus PRESENT    Hyaline Casts, UA PRESENT     Comment: Performed at [facility], [address], [place], [place] [address]  Ethanol     Status: None   Collection Time: 01/25/18  7:38 PM  Result Value Ref Range   Alcohol, Ethyl (B) <10 <10 mg/dL    Comment:        LOWEST DETECTABLE LIMIT FOR SERUM ALCOHOL IS 10 mg/dL FOR MEDICAL PURPOSES ONLY Performed at [facility], [place] [address], [place], [place] [address]   Protime-INR     Status: None   Collection Time: 01/25/18  7:38 PM  Result Value Ref Range   Prothrombin Time 14.1 11.4 - 15.2 seconds   INR 1.10     Comment: Performed at [facility] [address] [place], [place] [address]  APTT     Status: None   Collection Time: 01/25/18  7:38 PM  Result Value Ref Range   aPTT 29 24 - 36 seconds    Comment: Performed at [facility] [address], [place], [place] [address]  CBC     Status: Abnormal   Collection Time: 01/25/18  7:38 PM  Result Value Ref Range   WBC 10.7 (  H) 4.0 - 10.5 K/uL   RBC 4.05 3.87 - 5.11 MIL/uL   Hemoglobin 13.0 12.0 - 15.0 g/dL   HCT 40.0 36.0 - 46.0 %   MCV 98.8 78.0 - 100.0 fL   MCH 32.1 26.0 - 34.0 pg   MCHC 32.5 30.0 - 36.0 g/dL   RDW 12.7 11.5 - 15.5 %   Platelets 237 150 - 400 K/uL    Comment: Performed at [facility], [place] [address], [place], [place] [address]  Differential     Status: Abnormal   Collection Time: 01/25/18  7:38 PM  Result Value Ref Range   Neutrophils Relative % 79 %   Neutro Abs 8.4 (H) 1.7 - 7.7 K/uL   Lymphocytes Relative 17 %   Lymphs Abs 1.8 0.7 - 4.0 K/uL   Monocytes Relative 4 %   Monocytes Absolute 0.5 0.1 - 1.0 K/uL   Eosinophils Relative 0 %   Eosinophils Absolute 0.0 0.0 - 0.7 K/uL    Basophils Relative 0 %   Basophils Absolute 0.0 0.0 - 0.1 K/uL    Comment: Performed at [facility] [address], [place], [place] [address]  Comprehensive metabolic panel     Status: Abnormal   Collection Time: 01/25/18  7:38 PM  Result Value Ref Range   Sodium 142 135 - 145 mmol/L   Potassium 3.6 3.5 - 5.1 mmol/L   Chloride 109 101 - 111 mmol/L   CO2 21 (L) 22 - 32 mmol/L   Glucose, Bld 114 (H) 65 - 99 mg/dL   BUN 12 6 - 20 mg/dL   Creatinine, Ser 1.03 (H) 0.44 - 1.00 mg/dL   Calcium 9.5 8.9 - 10.3 mg/dL   Total Protein 6.8 6.5 - 8.1 g/dL   Albumin 3.6 3.5 - 5.0 g/dL   AST 24 15 - 41 U/L   ALT 11 (L) 14 - 54 U/L   Alkaline Phosphatase 46 38 - 126 U/L   Total Bilirubin 0.8 0.3 - 1.2 mg/dL   GFR calc non Af Amer 55 (L) >60 mL/min   GFR calc Af Amer >60 >60 mL/min    Comment: (NOTE) The eGFR has been calculated using the CKD EPI equation. This calculation has not been validated in all clinical situations. eGFR's persistently <60 mL/min signify possible Chronic Kidney Disease.    Anion gap 12 5 - 15    Comment: Performed at [facility] [address] [place], [place] [address]  I-stat troponin, ED     Status: None   Collection Time: 01/25/18  7:51 PM  Result Value Ref Range   Troponin i, poc 0.02 0.00 - 0.08 ng/mL   Comment 3            Comment: Due to the release kinetics of cTnI, a negative result within the first hours of the onset of symptoms does not rule out myocardial infarction with certainty. If myocardial infarction is still suspected, repeat the test at appropriate intervals.   Hemoglobin A1c     Status: None   Collection Time: 01/26/18  3:12 AM  Result Value Ref Range   Hgb A1c MFr Bld 5.0 4.8 - 5.6 %    Comment: (NOTE) Pre diabetes:          5.7%-6.4% Diabetes:              >6.4% Glycemic control for   <7.0% adults with diabetes    Mean Plasma Glucose 96.8 mg/dL  Comment: Performed at [facility], [place] [address]  [place], [place] [address]  Lipid panel     Status: Abnormal   Collection Time: 01/26/18  3:12 AM  Result Value Ref Range   Cholesterol 175 0 - 200 mg/dL   Triglycerides 212 (H) <150 mg/dL   HDL 36 (L) >40 mg/dL   Total CHOL/HDL Ratio 4.9 RATIO   VLDL 42 (H) 0 - 40 mg/dL   LDL Cholesterol 97 0 - 99 mg/dL    Comment:        Total Cholesterol/HDL:CHD Risk Coronary Heart Disease Risk Table                     Men   Women  1/2 Average Risk   3.4   3.3  Average Risk       5.0   4.4  2 X Average Risk   9.6   7.1  3 X Average Risk  23.4   11.0        Use the calculated Patient Ratio above and the CHD Risk Table to determine the patient's CHD Risk.        ATP III CLASSIFICATION (LDL):  <100     mg/dL   Optimal  100-129  mg/dL   Near or Above                    Optimal  130-159  mg/dL   Borderline  160-189  mg/dL   High  >190     mg/dL   Very High Performed at [facility] [address] [place], [place] [address]   Basic metabolic panel     Status: Abnormal   Collection Time: 01/26/18  3:12 AM  Result Value Ref Range   Sodium 141 135 - 145 mmol/L   Potassium 3.0 (L) 3.5 - 5.1 mmol/L   Chloride 112 (H) 101 - 111 mmol/L   CO2 19 (L) 22 - 32 mmol/L   Glucose, Bld 105 (H) 65 - 99 mg/dL   BUN 11 6 - 20 mg/dL   Creatinine, Ser 0.99 0.44 - 1.00 mg/dL   Calcium 9.0 8.9 - 10.3 mg/dL   GFR calc non Af Amer 58 (L) >60 mL/min   GFR calc Af Amer >60 >60 mL/min    Comment: (NOTE) The eGFR has been calculated using the CKD EPI equation. This calculation has not been validated in all clinical situations. eGFR's persistently <60 mL/min signify possible Chronic Kidney Disease.    Anion gap 10 5 - 15    Comment: Performed at [facility] [address], [place],  [address]  CBC     Status: Abnormal   Collection Time: 01/26/18  3:12 AM  Result Value Ref Range   WBC 9.4 4.0 - 10.5 K/uL   RBC 3.79 (L) 3.87 - 5.11 MIL/uL   Hemoglobin 11.9 (L) 12.0 - 15.0 g/dL   HCT 36.9  36.0 - 46.0 %   MCV 97.4 78.0 - 100.0 fL   MCH 31.4 26.0 - 34.0 pg   MCHC 32.2 30.0 - 36.0 g/dL   RDW 12.8 11.5 - 15.5 %   Platelets 231 150 - 400 K/uL    Comment: Performed at [facility], [place] [address], [place],  [address]    Dg Chest 1 View  Result Date: 01/25/2018 CLINICAL DATA:  Headache and left-sided weakness. Atrial fibrillation. EXAM: CHEST  1 VIEW COMPARISON:  None. FINDINGS: Cardiomegaly  without overt pulmonary edema. No pleural effusion or pneumothorax. No focal airspace consolidation. IMPRESSION: Cardiomegaly without overt pulmonary edema. Electronically Signed   By: Ulyses Jarred M.D.   On: 01/25/2018 19:45   Ct Head Wo Contrast  Result Date: 01/25/2018 CLINICAL DATA:  Headache and left-sided weakness EXAM: CT HEAD WITHOUT CONTRAST TECHNIQUE: Contiguous axial images were obtained from the base of the skull through the vertex without intravenous contrast. COMPARISON:  None. FINDINGS: Brain: Asymmetric hypoattenuation within the right PCA distribution, involving the right occipital and temporal lobes. No acute hemorrhage. No midline shift or mass effect. Normal appearance of the brain parenchyma and extra axial spaces for age. Vascular: No hyperdense vessel or unexpected vascular calcification. Skull: Normal visualized skull base, calvarium and extracranial soft tissues. Sinuses/Orbits: No sinus fluid levels or advanced mucosal thickening. No mastoid effusion. Normal orbits. IMPRESSION: Asymmetric hypoattenuation within the right posterior cerebral artery territory suggest acute to subacute infarct. MRI is recommended for confirmation. No hemorrhage or mass effect. Electronically Signed   By: Ulyses Jarred M.D.   On: 01/25/2018 19:49   Mr Brain Wo Contrast (neuro Protocol)  Result Date: 01/26/2018 CLINICAL DATA:  Ataxia and LEFT-sided weakness since yesterday. Suspect stroke. In atrial fibrillation. EXAM: MRI HEAD WITHOUT CONTRAST MRA HEAD WITHOUT CONTRAST TECHNIQUE:  Multiplanar, multiecho pulse sequences of the brain and surrounding structures were obtained without intravenous contrast. Angiographic images of the head were obtained using MRA technique without contrast. COMPARISON:  CT HEAD January 25, 2018 FINDINGS: MRI HEAD FINDINGS INTRACRANIAL CONTENTS: Confluent juice diffusion mesial RIGHT temporooccipital lobe. Patchy faint reduced diffusion RIGHT thalamus. Areas of reduced diffusion demonstrate low ADC values. No susceptibility artifact to suggest hemorrhage. No parenchymal brain volume loss for age. No midline shift, significant mass effect or masses. A few scattered subcentimeter supratentorial white matter FLAIR T2 hyperintensities compatible with mild chronic small vessel ischemic disease, less than expected for age. No abnormal extra-axial fluid collections. VASCULAR: Normal major intracranial vascular flow voids present at skull base. SKULL AND UPPER CERVICAL SPINE: No abnormal sellar expansion. No suspicious calvarial bone marrow signal. Craniocervical junction maintained. SINUSES/ORBITS: The mastoid air-cells and included paranasal sinuses are well-aerated.The included ocular globes and orbital contents are non-suspicious. Status post bilateral ocular lens implants. OTHER: None. MRA HEAD FINDINGS ANTERIOR CIRCULATION: Normal flow related enhancement of the included cervical, petrous, cavernous and supraclinoid internal carotid arteries. Patent anterior communicating artery. Patent anterior and middle cerebral arteries, mild luminal irregularity compatible with atherosclerosis. No large vessel occlusion, flow limiting stenosis, aneurysm. POSTERIOR CIRCULATION: Codominant vertebral arteries. Basilar artery is patent, with normal flow related enhancement of the main branch vessels. Occluded RIGHT P2 segment without significant reconstitution. Mild luminal irregularity LEFT posterior cerebral artery compatible with atherosclerosis. Patent LEFT posterior cerebral  artery. Small bilateral posterior communicating arteries present. No  flow limiting stenosis,  aneurysm. ANATOMIC VARIANTS: None. Source images and MIP images were reviewed. IMPRESSION: MRI HEAD: 1. Acute nonhemorrhagic RIGHT PCA territory infarcts (RIGHT temporooccipital lobe and RIGHT thalamus). 2. Otherwise negative noncontrast MRI head for age. MRA HEAD: 1. Acute RIGHT posterior cerebral artery occlusion. 2. Mild intracranial atherosclerosis. These results will be called to the ordering clinician or representative by the Radiologist Assistant, and communication documented in the PACS or zVision Dashboard. Electronically Signed   By: Elon Alas M.D.   On: 01/26/2018 00:02   Mr Jodene Nam Head Wo Contrast  Result Date: 01/26/2018 CLINICAL DATA:  Ataxia and LEFT-sided weakness since yesterday. Suspect stroke. In atrial fibrillation. EXAM: MRI HEAD WITHOUT CONTRAST  MRA HEAD WITHOUT CONTRAST TECHNIQUE: Multiplanar, multiecho pulse sequences of the brain and surrounding structures were obtained without intravenous contrast. Angiographic images of the head were obtained using MRA technique without contrast. COMPARISON:  CT HEAD January 25, 2018 FINDINGS: MRI HEAD FINDINGS INTRACRANIAL CONTENTS: Confluent juice diffusion mesial RIGHT temporooccipital lobe. Patchy faint reduced diffusion RIGHT thalamus. Areas of reduced diffusion demonstrate low ADC values. No susceptibility artifact to suggest hemorrhage. No parenchymal brain volume loss for age. No midline shift, significant mass effect or masses. A few scattered subcentimeter supratentorial white matter FLAIR T2 hyperintensities compatible with mild chronic small vessel ischemic disease, less than expected for age. No abnormal extra-axial fluid collections. VASCULAR: Normal major intracranial vascular flow voids present at skull base. SKULL AND UPPER CERVICAL SPINE: No abnormal sellar expansion. No suspicious calvarial bone marrow signal. Craniocervical junction  maintained. SINUSES/ORBITS: The mastoid air-cells and included paranasal sinuses are well-aerated.The included ocular globes and orbital contents are non-suspicious. Status post bilateral ocular lens implants. OTHER: None. MRA HEAD FINDINGS ANTERIOR CIRCULATION: Normal flow related enhancement of the included cervical, petrous, cavernous and supraclinoid internal carotid arteries. Patent anterior communicating artery. Patent anterior and middle cerebral arteries, mild luminal irregularity compatible with atherosclerosis. No large vessel occlusion, flow limiting stenosis, aneurysm. POSTERIOR CIRCULATION: Codominant vertebral arteries. Basilar artery is patent, with normal flow related enhancement of the main branch vessels. Occluded RIGHT P2 segment without significant reconstitution. Mild luminal irregularity LEFT posterior cerebral artery compatible with atherosclerosis. Patent LEFT posterior cerebral artery. Small bilateral posterior communicating arteries present. No  flow limiting stenosis,  aneurysm. ANATOMIC VARIANTS: None. Source images and MIP images were reviewed. IMPRESSION: MRI HEAD: 1. Acute nonhemorrhagic RIGHT PCA territory infarcts (RIGHT temporooccipital lobe and RIGHT thalamus). 2. Otherwise negative noncontrast MRI head for age. MRA HEAD: 1. Acute RIGHT posterior cerebral artery occlusion. 2. Mild intracranial atherosclerosis. These results will be called to the ordering clinician or representative by the Radiologist Assistant, and communication documented in the PACS or zVision Dashboard. Electronically Signed   By: Elon Alas M.D.   On: 01/26/2018 00:02    Review of Systems  Constitutional: Positive for malaise/fatigue. Negative for chills, diaphoresis, fever and weight loss.  HENT: Negative for hearing loss.   Respiratory: Negative for hemoptysis, sputum production and shortness of breath.   Cardiovascular: Positive for palpitations. Negative for chest pain, orthopnea, claudication  and leg swelling.  Gastrointestinal: Negative for abdominal pain, nausea and vomiting.  Genitourinary: Negative for dysuria.  Neurological: Positive for weakness and headaches.   Blood pressure 106/87, pulse 64, temperature 98.2 F (36.8 C), temperature source Oral, resp. rate 19, height '5\' 2"'  (1.575 m), weight 86.2 kg (190 lb), SpO2 97 %. Physical Exam  Constitutional: She is oriented to person, place, and time.  HENT:  Head: Normocephalic and atraumatic.  Eyes: Pupils are equal, round, and reactive to light. Conjunctivae are normal. Left eye exhibits no discharge. No scleral icterus.  Neck: Normal range of motion. Neck supple. No JVD present. No tracheal deviation present. No thyromegaly present.  Cardiovascular:  Irregularly irregular heart rate between 90-110. A. Fib on the monitor S1 and S2 soft  Respiratory: Effort normal and breath sounds normal. No respiratory distress. She has no wheezes. She has no rales.  GI: Soft. Bowel sounds are normal.  Musculoskeletal: She exhibits no edema, tenderness or deformity.  Neurological: She is alert and oriented to person, place, and time.    Assessment/Plan: Probably new onset chronic A. Fib with RVR Chadsvasc score of 5 Acute right  PCA infarct (temporo-occipital lobe and right thalamic infarct) probably cardioembolic Hypertension Glucose intolerance Hyperlipidemia Morbid obesity Plan Agree with present management of rate control and chronic anticoagulation. Will not attempt cardioversion in view of recent stroke Check 2-D echo, TSH, lipid panel, hemoglobin A1c   Arieh Bogue 01/26/2018, 3:12 PM

## 2018-01-26 NOTE — Progress Notes (Addendum)
PROGRESS NOTE    Diane Pruitt  IRJ:[id number] DOB: 12-25-1950 DOA: 01/25/2018 PCP: [name], MD   Brief Narrative: Diane Pruitt is a 67 y.o. female with a history of hypertension, hyperlipidemia. She presented with left sided weakness and found to have an acute PCA territory stroke.   Assessment & Plan:   Principal Problem:   Ischemic stroke [facility]) Active Problems:   Atrial fibrillation with RVR ([place])   Hypertension   Ischemic stroke Right PCA territory with Right PCA artery occlusion. ?Related to atrial fibrillation. LDL of 97 -Neurology recommendations -Carotid dopplers, Transthoracic Echocardiogram pending -Continue aspirin and Lipitor (goal LDL <70) -PT/OT eval  Atrial fibrillation with RVR Currently rate controlled on diltiazem. CHA2DS2-VASc Score is 5. -Continue diltiazem IV -Consult cardiology -repeat EKG  Hyperlipidemia On Lipitor as an outpatient. LDL of 97. -Continue Lipitor  Essential hypertension Holding lisinopril-hydrochlorothiazide in setting of acute stroke.  Obesity Body mass index is 34.75 kg/m.   DVT prophylaxis: Lovenox Code Status:   Code Status: Full Code Family Communication: Daughter and son at bedside Disposition Plan: Discharge likely in 24-48 hours pending neuro/cardiology workup   Consultants:   Neurology  Cardiology  Procedures:   None  Antimicrobials:  None    Subjective: Feels better today with regard to weakness  Objective: Vitals:   01/26/18 0930 01/26/18 1000 01/26/18 1015 01/26/18 1045  BP: 103/75 115/69 100/72 117/90  Pulse: 81 100 (!) 54 (!) 117  Resp: 16 (!) 24 15 18   Temp:      TempSrc:      SpO2: 95% 97% 96% 96%  Weight:      Height:        Intake/Output Summary (Last 24 hours) at 01/26/2018 1226 Last data filed at 01/26/2018 0901 Gross per 24 hour  Intake 1593.75 ml  Output 550 ml  Net 1043.75 ml   Filed Weights   01/25/18 1937  Weight: 86.2 kg (190 lb)     Examination:  General exam: Appears calm and comfortable Respiratory system: Clear to auscultation. Respiratory effort normal. Cardiovascular system: S1 & S2 heard, regular rate. No irregular rhythm. Gastrointestinal system: Abdomen is nondistended, soft and nontender. No organomegaly or masses felt. Normal bowel sounds heard. Central nervous system: Alert and oriented. No focal neurological deficits. Extremities: No edema. No calf tenderness Skin: No cyanosis. No rashes Psychiatry: Judgement and insight appear normal. Mood & affect appropriate.     Data Reviewed: I have personally reviewed following labs and imaging studies  CBC: Recent Labs  Lab 01/25/18 1938 01/26/18 0312  WBC 10.7* 9.4  NEUTROABS 8.4*  --   HGB 13.0 11.9*  HCT 40.0 36.9  MCV 98.8 97.4  PLT 237 301   Basic Metabolic Panel: Recent Labs  Lab 01/25/18 1938 01/26/18 0312  NA 142 141  K 3.6 3.0*  CL 109 112*  CO2 21* 19*  GLUCOSE 114* 105*  BUN 12 11  CREATININE 1.03* 0.99  CALCIUM 9.5 9.0   GFR: Estimated Creatinine Clearance: 56.9 mL/min (by C-G formula based on SCr of 0.99 mg/dL). Liver Function Tests: Recent Labs  Lab 01/25/18 1938  AST 24  ALT 11*  ALKPHOS 46  BILITOT 0.8  PROT 6.8  ALBUMIN 3.6   No results for input(s): LIPASE, AMYLASE in the last 168 hours. No results for input(s): AMMONIA in the last 168 hours. Coagulation Profile: Recent Labs  Lab 01/25/18 1938  INR 1.10   Cardiac Enzymes: No results for input(s): CKTOTAL, CKMB, CKMBINDEX, TROPONINI in the last 168 hours.  BNP (last 3 results) No results for input(s): PROBNP in the last 8760 hours. HbA1C: Recent Labs    01/26/18 0312  HGBA1C 5.0   CBG: No results for input(s): GLUCAP in the last 168 hours. Lipid Profile: Recent Labs    01/26/18 0312  CHOL 175  HDL 36*  LDLCALC 97  TRIG 212*  CHOLHDL 4.9   Thyroid Function Tests: No results for input(s): TSH, T4TOTAL, FREET4, T3FREE, THYROIDAB in the last  72 hours. Anemia Panel: No results for input(s): VITAMINB12, FOLATE, FERRITIN, TIBC, IRON, RETICCTPCT in the last 72 hours. Sepsis Labs: No results for input(s): PROCALCITON, LATICACIDVEN in the last 168 hours.  No results found for this or any previous visit (from the past 240 hour(s)).       Radiology Studies: Dg Chest 1 View  Result Date: 01/25/2018 CLINICAL DATA:  Headache and left-sided weakness. Atrial fibrillation. EXAM: CHEST  1 VIEW COMPARISON:  None. FINDINGS: Cardiomegaly without overt pulmonary edema. No pleural effusion or pneumothorax. No focal airspace consolidation. IMPRESSION: Cardiomegaly without overt pulmonary edema. Electronically Signed   By: Ulyses Jarred M.D.   On: 01/25/2018 19:45   Ct Head Wo Contrast  Result Date: 01/25/2018 CLINICAL DATA:  Headache and left-sided weakness EXAM: CT HEAD WITHOUT CONTRAST TECHNIQUE: Contiguous axial images were obtained from the base of the skull through the vertex without intravenous contrast. COMPARISON:  None. FINDINGS: Brain: Asymmetric hypoattenuation within the right PCA distribution, involving the right occipital and temporal lobes. No acute hemorrhage. No midline shift or mass effect. Normal appearance of the brain parenchyma and extra axial spaces for age. Vascular: No hyperdense vessel or unexpected vascular calcification. Skull: Normal visualized skull base, calvarium and extracranial soft tissues. Sinuses/Orbits: No sinus fluid levels or advanced mucosal thickening. No mastoid effusion. Normal orbits. IMPRESSION: Asymmetric hypoattenuation within the right posterior cerebral artery territory suggest acute to subacute infarct. MRI is recommended for confirmation. No hemorrhage or mass effect. Electronically Signed   By: Ulyses Jarred M.D.   On: 01/25/2018 19:49   Mr Brain Wo Contrast (neuro Protocol)  Result Date: 01/26/2018 CLINICAL DATA:  Ataxia and LEFT-sided weakness since yesterday. Suspect stroke. In atrial  fibrillation. EXAM: MRI HEAD WITHOUT CONTRAST MRA HEAD WITHOUT CONTRAST TECHNIQUE: Multiplanar, multiecho pulse sequences of the brain and surrounding structures were obtained without intravenous contrast. Angiographic images of the head were obtained using MRA technique without contrast. COMPARISON:  CT HEAD January 25, 2018 FINDINGS: MRI HEAD FINDINGS INTRACRANIAL CONTENTS: Confluent juice diffusion mesial RIGHT temporooccipital lobe. Patchy faint reduced diffusion RIGHT thalamus. Areas of reduced diffusion demonstrate low ADC values. No susceptibility artifact to suggest hemorrhage. No parenchymal brain volume loss for age. No midline shift, significant mass effect or masses. A few scattered subcentimeter supratentorial white matter FLAIR T2 hyperintensities compatible with mild chronic small vessel ischemic disease, less than expected for age. No abnormal extra-axial fluid collections. VASCULAR: Normal major intracranial vascular flow voids present at skull base. SKULL AND UPPER CERVICAL SPINE: No abnormal sellar expansion. No suspicious calvarial bone marrow signal. Craniocervical junction maintained. SINUSES/ORBITS: The mastoid air-cells and included paranasal sinuses are well-aerated.The included ocular globes and orbital contents are non-suspicious. Status post bilateral ocular lens implants. OTHER: None. MRA HEAD FINDINGS ANTERIOR CIRCULATION: Normal flow related enhancement of the included cervical, petrous, cavernous and supraclinoid internal carotid arteries. Patent anterior communicating artery. Patent anterior and middle cerebral arteries, mild luminal irregularity compatible with atherosclerosis. No large vessel occlusion, flow limiting stenosis, aneurysm. POSTERIOR CIRCULATION: Codominant vertebral arteries. Basilar artery is  patent, with normal flow related enhancement of the main branch vessels. Occluded RIGHT P2 segment without significant reconstitution. Mild luminal irregularity LEFT posterior  cerebral artery compatible with atherosclerosis. Patent LEFT posterior cerebral artery. Small bilateral posterior communicating arteries present. No  flow limiting stenosis,  aneurysm. ANATOMIC VARIANTS: None. Source images and MIP images were reviewed. IMPRESSION: MRI HEAD: 1. Acute nonhemorrhagic RIGHT PCA territory infarcts (RIGHT temporooccipital lobe and RIGHT thalamus). 2. Otherwise negative noncontrast MRI head for age. MRA HEAD: 1. Acute RIGHT posterior cerebral artery occlusion. 2. Mild intracranial atherosclerosis. These results will be called to the ordering clinician or representative by the Radiologist Assistant, and communication documented in the PACS or zVision Dashboard. Electronically Signed   By: Elon Alas M.D.   On: 01/26/2018 00:02   Mr Jodene Nam Head Wo Contrast  Result Date: 01/26/2018 CLINICAL DATA:  Ataxia and LEFT-sided weakness since yesterday. Suspect stroke. In atrial fibrillation. EXAM: MRI HEAD WITHOUT CONTRAST MRA HEAD WITHOUT CONTRAST TECHNIQUE: Multiplanar, multiecho pulse sequences of the brain and surrounding structures were obtained without intravenous contrast. Angiographic images of the head were obtained using MRA technique without contrast. COMPARISON:  CT HEAD January 25, 2018 FINDINGS: MRI HEAD FINDINGS INTRACRANIAL CONTENTS: Confluent juice diffusion mesial RIGHT temporooccipital lobe. Patchy faint reduced diffusion RIGHT thalamus. Areas of reduced diffusion demonstrate low ADC values. No susceptibility artifact to suggest hemorrhage. No parenchymal brain volume loss for age. No midline shift, significant mass effect or masses. A few scattered subcentimeter supratentorial white matter FLAIR T2 hyperintensities compatible with mild chronic small vessel ischemic disease, less than expected for age. No abnormal extra-axial fluid collections. VASCULAR: Normal major intracranial vascular flow voids present at skull base. SKULL AND UPPER CERVICAL SPINE: No abnormal sellar  expansion. No suspicious calvarial bone marrow signal. Craniocervical junction maintained. SINUSES/ORBITS: The mastoid air-cells and included paranasal sinuses are well-aerated.The included ocular globes and orbital contents are non-suspicious. Status post bilateral ocular lens implants. OTHER: None. MRA HEAD FINDINGS ANTERIOR CIRCULATION: Normal flow related enhancement of the included cervical, petrous, cavernous and supraclinoid internal carotid arteries. Patent anterior communicating artery. Patent anterior and middle cerebral arteries, mild luminal irregularity compatible with atherosclerosis. No large vessel occlusion, flow limiting stenosis, aneurysm. POSTERIOR CIRCULATION: Codominant vertebral arteries. Basilar artery is patent, with normal flow related enhancement of the main branch vessels. Occluded RIGHT P2 segment without significant reconstitution. Mild luminal irregularity LEFT posterior cerebral artery compatible with atherosclerosis. Patent LEFT posterior cerebral artery. Small bilateral posterior communicating arteries present. No  flow limiting stenosis,  aneurysm. ANATOMIC VARIANTS: None. Source images and MIP images were reviewed. IMPRESSION: MRI HEAD: 1. Acute nonhemorrhagic RIGHT PCA territory infarcts (RIGHT temporooccipital lobe and RIGHT thalamus). 2. Otherwise negative noncontrast MRI head for age. MRA HEAD: 1. Acute RIGHT posterior cerebral artery occlusion. 2. Mild intracranial atherosclerosis. These results will be called to the ordering clinician or representative by the Radiologist Assistant, and communication documented in the PACS or zVision Dashboard. Electronically Signed   By: Elon Alas M.D.   On: 01/26/2018 00:02        Scheduled Meds: .  stroke: mapping our early stages of recovery book   Does not apply Once  . aspirin  300 mg Rectal Daily   Or  . aspirin  325 mg Oral Daily  . atorvastatin  40 mg Oral q1800  . enoxaparin (LOVENOX) injection  40 mg  Subcutaneous Daily  . multivitamin with minerals  1 tablet Oral Daily  . potassium chloride  20 mEq Oral TID  Continuous Infusions: . diltiazem (CARDIZEM) infusion 7.5 mg/hr (01/26/18 0200)     LOS: 1 day     Cordelia Poche, MD [facility] 01/26/2018, 12:26 PM Pager: [phone]  If 7PM-7AM, please contact night-coverage [email or web address] Password [facility] 01/26/2018, 12:26 PM

## 2018-01-26 NOTE — ED Notes (Signed)
Neuro at bedside.

## 2018-01-26 NOTE — Progress Notes (Signed)
ANTICOAGULATION CONSULT NOTE - Initial Consult  Pharmacy Consult for heparin Indication: atrial fibrillation  No Known Allergies  Patient Measurements: Height: 5\' 2"  (157.5 cm) Weight: 190 lb (86.2 kg) IBW/kg (Calculated) : 50.1 Heparin Dosing Weight: 69.7 kg  Vital Signs: BP: 100/76 (03/31 1300) Pulse Rate: 98 (03/31 1300)  Labs: Recent Labs    01/25/18 1938 01/26/18 0312  HGB 13.0 11.9*  HCT 40.0 36.9  PLT 237 231  APTT 29  --   LABPROT 14.1  --   INR 1.10  --   CREATININE 1.03* 0.99    Estimated Creatinine Clearance: 56.9 mL/min (by C-G formula based on SCr of 0.99 mg/dL).  Medical History: History reviewed. No pertinent past medical history.  Assessment: 66 yo F presents on 3/30 with left sided weakness. Had ischemic stroke  Hx of Afib but not on any anticoag. CHADsVASC = 5. Hgb 11.9, plts wnl.  Goal of Therapy:  Heparin level 0.3-0.5 units/mL Monitor platelets by anticoagulation protocol: Yes   Plan:  No heparin bolus Start heparin gtt at 950 units/hr Monitor daily heparin level, CBC, s/s of bleed  Elenor Quinones, PharmD, BCPS Clinical Pharmacist Pager [phone] 01/26/2018 1:29 PM

## 2018-01-26 NOTE — Progress Notes (Signed)
*  PRELIMINARY RESULTS* Vascular Ultrasound Carotid Duplex (Doppler) has been completed.  Findings suggest 1-39% internal carotid artery stenosis bilaterally. Vertebral arteries are patent with antegrade flow.  01/26/2018 3:29 PM Maudry Mayhew, BS, RVT, RDCS, RDMS

## 2018-01-26 NOTE — ED Notes (Signed)
Heart Healthy Meal Tray ordered.

## 2018-01-26 NOTE — Progress Notes (Signed)
ANTICOAGULATION CONSULT NOTE  Pharmacy Consult for heparin Indication: atrial fibrillation  No Known Allergies  Patient Measurements: Height: 5\' 2"  (157.5 cm) Weight: 190 lb (86.2 kg) IBW/kg (Calculated) : 50.1 Heparin Dosing Weight: 69.7 kg  Vital Signs: Temp: 98.1 F (36.7 C) (03/31 2043) Temp Source: Oral (03/31 2043) BP: 127/63 (03/31 2043) Pulse Rate: 63 (03/31 2043)  Labs: Recent Labs    01/25/18 1938 01/26/18 0312 01/26/18 2050  HGB 13.0 11.9*  --   HCT 40.0 36.9  --   PLT 237 231  --   APTT 29  --   --   LABPROT 14.1  --   --   INR 1.10  --   --   HEPARINUNFRC  --   --  0.32  CREATININE 1.03* 0.99  --     Estimated Creatinine Clearance: 56.9 mL/min (by C-G formula based on SCr of 0.99 mg/dL).  Medical History: History reviewed. No pertinent past medical history.  Assessment: 67 yo F presents on 3/30 with left sided weakness, found to have ischemic stroke. Hx of Afib but not on any anticoag. CHADsVASC = 5. Initial heparin level therapeutic. Hgb 11.9, plts wnl. No bleed documented.  Goal of Therapy:  Heparin level 0.3-0.5 units/mL, no bolus Monitor platelets by anticoagulation protocol: Yes   Plan:  Continue heparin gtt at 950 units/hr Confirmatory heparin level with AM labs Monitor daily heparin level, CBC, s/s of bleed  Elicia Lamp, PharmD, BCPS Clinical Pharmacist 01/26/2018 9:08 PM

## 2018-01-27 ENCOUNTER — Encounter (HOSPITAL_COMMUNITY): Payer: Self-pay | Admitting: *Deleted

## 2018-01-27 DIAGNOSIS — E876 Hypokalemia: Secondary | ICD-10-CM

## 2018-01-27 LAB — BASIC METABOLIC PANEL
Anion gap: 8 (ref 5–15)
BUN: 11 mg/dL (ref 6–20)
CALCIUM: 9.8 mg/dL (ref 8.9–10.3)
CO2: 21 mmol/L — AB (ref 22–32)
CREATININE: 0.9 mg/dL (ref 0.44–1.00)
Chloride: 114 mmol/L — ABNORMAL HIGH (ref 101–111)
GFR calc non Af Amer: >60 mL/min (ref >60)
GLUCOSE: 106 mg/dL — AB (ref 65–99)
Potassium: 3.5 mmol/L (ref 3.5–5.1)
Sodium: 143 mmol/L (ref 135–145)

## 2018-01-27 LAB — CBC
HCT: 37.1 % (ref 36.0–46.0)
Hemoglobin: 11.8 g/dL — ABNORMAL LOW (ref 12.0–15.0)
MCH: 31.6 pg (ref 26.0–34.0)
MCHC: 31.8 g/dL (ref 30.0–36.0)
MCV: 99.2 fL (ref 78.0–100.0)
PLATELETS: 199 10*3/uL (ref 150–400)
RBC: 3.74 MIL/uL — ABNORMAL LOW (ref 3.87–5.11)
RDW: 13.3 % (ref 11.5–15.5)
WBC: 9.7 10*3/uL (ref 4.0–10.5)

## 2018-01-27 LAB — HEPARIN LEVEL (UNFRACTIONATED): HEPARIN UNFRACTIONATED: 0.32 [IU]/mL (ref 0.30–0.70)

## 2018-01-27 MED ORDER — TRAZODONE HCL 50 MG PO TABS
50.0000 mg | ORAL_TABLET | Freq: Every evening | ORAL | Status: DC | PRN
  Administered 2018-01-27 (×2): 50 mg via ORAL
  Filled 2018-01-27 (×2): qty 1

## 2018-01-27 MED ORDER — APIXABAN 5 MG PO TABS
5.0000 mg | ORAL_TABLET | Freq: Two times a day (BID) | ORAL | Status: DC
  Administered 2018-01-27 – 2018-01-28 (×3): 5 mg via ORAL
  Filled 2018-01-27 (×3): qty 1

## 2018-01-27 MED ORDER — ATORVASTATIN CALCIUM 80 MG PO TABS
80.0000 mg | ORAL_TABLET | Freq: Every day | ORAL | Status: DC
  Administered 2018-01-27: 80 mg via ORAL
  Filled 2018-01-27: qty 1

## 2018-01-27 MED ORDER — METOPROLOL TARTRATE 25 MG PO TABS
25.0000 mg | ORAL_TABLET | Freq: Two times a day (BID) | ORAL | Status: DC
  Administered 2018-01-27 – 2018-01-28 (×3): 25 mg via ORAL
  Filled 2018-01-27 (×3): qty 1

## 2018-01-27 MED ORDER — POTASSIUM CHLORIDE CRYS ER 20 MEQ PO TBCR
20.0000 meq | EXTENDED_RELEASE_TABLET | Freq: Every day | ORAL | Status: DC
  Administered 2018-01-27 – 2018-01-28 (×2): 20 meq via ORAL
  Filled 2018-01-27 (×2): qty 1

## 2018-01-27 NOTE — Progress Notes (Signed)
PROGRESS NOTE    Diane Pruitt  PYP:[id number] DOB: 01/24/1951 DOA: 01/25/2018 PCP: [name], MD      Brief Narrative:  Ms. Homer is a 67 year old female who presented to the ED via EMS for left arm and leg weakness. She awoke to use the restroom on Friday night at 2 AM and noticed she had a headache and blurry vision which is atypical for her, she took Tylenol and went back to sleep and awoke at 4 PM on Saturday. At this time she noticed left arm and leg weakness and called EMS. Per EMS stroke screen was negative, patient was outside window for tPA. EMS noted her to have atrial fibrillation with RVR. History of HTN previously on Lisinopril-HCTZ (being held while admitted) and hyperlipidemia on atorvastatin.   Assessment & Plan: Ischemic stroke -CT, MRI and MRA of the head are consistent with PCA occlusion and infarct. Carotid dopplers show 1-39% internal carotid artery stenosis bilaterally. TTE illustrates LVEF 60-65%, moderate LVH, mild MR, moderate biatrial enlargement with dilated IVC. -Source of emboli most likely cardiogenic in origin, could consider ordering TEE for further evaluation. -Continue Eliquis tab 5 mg PO BID.  Atrial Fibrillation -Continue diltiazem 5-15 mg/hr titrated. -Continue Potassium Chloride 20 mEq tab QD PO.  Hypertension -Continue Metoprolol 25 mg PO BID.  Hyperlipidemia -Continue atorvastatin 80 mg tab q1800.  Obesity  -BMI 34.75 kg/m   .  stroke: mapping our early stages of recovery book   Does not apply Once  . apixaban  5 mg Oral BID  . atorvastatin  80 mg Oral q1800  . metoprolol tartrate  25 mg Oral BID  . multivitamin with minerals  1 tablet Oral Daily  . potassium chloride  20 mEq Oral Daily   @CPDBMP @   DVT prophylaxis: Eliquis 5mg  PO BID. Code Status: FULL Family Communication: Daughter at bedside. MDM and disposition Plan: The below labs and imaging reports were reviewed. Echocardiogram, Carotid duplex, CT head, MRI/MRA of  brain reviewed. The patient's status is clinically stable.   Consultants:   Cardiology  Neurology  Procedures:   Echocardiogram (TTE) 03/30 IMPRESSION: 1. LVEF 60-65%, moderate LVH, mild MR, moderate biatrial enlargement,  dilated IVC.  Carotid Duplex (Doppler) 1. Carotid Duplex (Doppler) has been completed.  Findings suggest 1-39% internal carotid artery stenosis bilaterally.  Vertebral arteries are patent with antegrade flow.  CT head 03/30 IMPRESSION:  1.Asymmetric hypoattenuation within the right posterior cerebral artery territory suggest acute to subacute infarct. MRI is recommended for confirmation. No hemorrhage or mass effect.  MRI brain 03/30 IMPRESSION: 1. Acute nonhemorrhagic RIGHT PCA territory infarcts (RIGHT temporooccipital lobe and RIGHT thalamus). 2. Otherwise negative noncontrast MRI head for age.  MRA head 03/30 IMPRESSION: 1. Acute RIGHT posterior cerebral artery occlusion. 2. Mild intracranial atherosclerosis.  CXR 03/30 IMPRESSION: 1.Cardiomegaly without overt pulmonary edema. No pleural effusion or pneumothorax. No focal airspace consolidation.   Subjective: Patient is awake and resting comfortably in bed, alert and oriented. Ambulating with assistance due to gait difficulty and still having blurred vision in temporal vision fields. Positive for dyspnea with exertion. Denies headache, lightheadedness, dizziness, confusion, numbness or tingling of extremities, chest pain, palpitations or orthopnea.  Objective: Vitals:   01/27/18 0752 01/27/18 0914 01/27/18 0921 01/27/18 1203  BP: 121/80   103/74  Pulse: 96 (!) 56 97 (!) 115  Resp: 20   18  Temp: 98.7 F (37.1 C)   98.6 F (37 C)  TempSrc: Oral   Oral  SpO2: 95%  99%  Weight:      Height:        Intake/Output Summary (Last 24 hours) at 01/27/2018 1357 Last data filed at 01/27/2018 1210 Gross per 24 hour  Intake 744.47 ml  Output -  Net 744.47 ml   Filed Weights   01/25/18 1937    Weight: 86.2 kg (190 lb)    Examination: General appearance: adult female, alert and in no distress.   HEENT: Anicteric, conjunctiva pink, lids and lashes normal. No nasal deformity, discharge, epistaxis.  Lips moist.  Skin: Warm and dry.  No jaundice.  No suspicious rashes or lesions. Cardiac: Irregularly irregular, nl S1-S2, no murmurs appreciated.  Capillary refill is brisk.  JVP normal and not visible.  No LE edema.  Radial pulses 2+ and symmetric. Respiratory: Normal respiratory rate and rhythm.  CTAB without rales or wheezes. Abdomen: Abdomen soft. No ascites, distension, hepatosplenomegaly.   MSK: No deformities or effusions. Neuro: Awake and alert.  EOMI, moves all extremities. Speech fluent. Cranial nerves intact. Psych: Sensorium intact and responding to questions, attention normal. Affect normal.  Judgment and insight appear normal.    Data Reviewed: I have personally reviewed following labs and imaging studies:  CBC: Recent Labs  Lab 01/25/18 1938 01/26/18 0312 01/27/18 0418  WBC 10.7* 9.4 9.7  NEUTROABS 8.4*  --   --   HGB 13.0 11.9* 11.8*  HCT 40.0 36.9 37.1  MCV 98.8 97.4 99.2  PLT 237 231 053   Basic Metabolic Panel: Recent Labs  Lab 01/25/18 1938 01/26/18 0312 01/27/18 0418  NA 142 141 143  K 3.6 3.0* 3.5  CL 109 112* 114*  CO2 21* 19* 21*  GLUCOSE 114* 105* 106*  BUN 12 11 11   CREATININE 1.03* 0.99 0.90  CALCIUM 9.5 9.0 9.8   GFR: Estimated Creatinine Clearance: 62.6 mL/min (by C-G formula based on SCr of 0.9 mg/dL). Liver Function Tests: Recent Labs  Lab 01/25/18 1938  AST 24  ALT 11*  ALKPHOS 46  BILITOT 0.8  PROT 6.8  ALBUMIN 3.6   No results for input(s): LIPASE, AMYLASE in the last 168 hours. No results for input(s): AMMONIA in the last 168 hours. Coagulation Profile: Recent Labs  Lab 01/25/18 1938  INR 1.10   Cardiac Enzymes: No results for input(s): CKTOTAL, CKMB, CKMBINDEX, TROPONINI in the last 168 hours. BNP (last 3  results) No results for input(s): PROBNP in the last 8760 hours. HbA1C: Recent Labs    01/26/18 0312  HGBA1C 5.0   CBG: No results for input(s): GLUCAP in the last 168 hours. Lipid Profile: Recent Labs    01/26/18 0312  CHOL 175  HDL 36*  LDLCALC 97  TRIG 212*  CHOLHDL 4.9   Thyroid Function Tests: No results for input(s): TSH, T4TOTAL, FREET4, T3FREE, THYROIDAB in the last 72 hours. Anemia Panel: No results for input(s): VITAMINB12, FOLATE, FERRITIN, TIBC, IRON, RETICCTPCT in the last 72 hours. Urine analysis:    Component Value Date/Time   COLORURINE YELLOW 01/25/2018 1856   APPEARANCEUR CLEAR 01/25/2018 1856   LABSPEC 1.010 01/25/2018 1856   PHURINE 6.0 01/25/2018 1856   GLUCOSEU NEGATIVE 01/25/2018 1856   HGBUR NEGATIVE 01/25/2018 1856   BILIRUBINUR NEGATIVE 01/25/2018 1856   KETONESUR NEGATIVE 01/25/2018 1856   PROTEINUR NEGATIVE 01/25/2018 1856   NITRITE NEGATIVE 01/25/2018 1856   LEUKOCYTESUR TRACE (A) 01/25/2018 1856   Sepsis Labs: @LABRCNTIP (procalcitonin:4,lacticacidven:4)  ) Recent Results (from the past 240 hour(s))  MRSA PCR Screening     Status: None  Collection Time: 01/26/18  4:53 PM  Result Value Ref Range Status   MRSA by PCR NEGATIVE NEGATIVE Final    Comment:        The GeneXpert MRSA Assay (FDA approved for NASAL specimens only), is one component of a comprehensive MRSA colonization surveillance program. It is not intended to diagnose MRSA infection nor to guide or monitor treatment for MRSA infections. Performed at [facility], [place] [address] [place], [place] [address]          Radiology Studies: Dg Chest 1 View  Result Date: 01/25/2018 CLINICAL DATA:  Headache and left-sided weakness. Atrial fibrillation. EXAM: CHEST  1 VIEW COMPARISON:  None. FINDINGS: Cardiomegaly without overt pulmonary edema. No pleural effusion or pneumothorax. No focal airspace consolidation. IMPRESSION: Cardiomegaly without overt pulmonary  edema. Electronically Signed   By: Ulyses Jarred M.D.   On: 01/25/2018 19:45   Ct Head Wo Contrast  Result Date: 01/25/2018 CLINICAL DATA:  Headache and left-sided weakness EXAM: CT HEAD WITHOUT CONTRAST TECHNIQUE: Contiguous axial images were obtained from the base of the skull through the vertex without intravenous contrast. COMPARISON:  None. FINDINGS: Brain: Asymmetric hypoattenuation within the right PCA distribution, involving the right occipital and temporal lobes. No acute hemorrhage. No midline shift or mass effect. Normal appearance of the brain parenchyma and extra axial spaces for age. Vascular: No hyperdense vessel or unexpected vascular calcification. Skull: Normal visualized skull base, calvarium and extracranial soft tissues. Sinuses/Orbits: No sinus fluid levels or advanced mucosal thickening. No mastoid effusion. Normal orbits. IMPRESSION: Asymmetric hypoattenuation within the right posterior cerebral artery territory suggest acute to subacute infarct. MRI is recommended for confirmation. No hemorrhage or mass effect. Electronically Signed   By: Ulyses Jarred M.D.   On: 01/25/2018 19:49   Mr Brain Wo Contrast (neuro Protocol)  Result Date: 01/26/2018 CLINICAL DATA:  Ataxia and LEFT-sided weakness since yesterday. Suspect stroke. In atrial fibrillation. EXAM: MRI HEAD WITHOUT CONTRAST MRA HEAD WITHOUT CONTRAST TECHNIQUE: Multiplanar, multiecho pulse sequences of the brain and surrounding structures were obtained without intravenous contrast. Angiographic images of the head were obtained using MRA technique without contrast. COMPARISON:  CT HEAD January 25, 2018 FINDINGS: MRI HEAD FINDINGS INTRACRANIAL CONTENTS: Confluent juice diffusion mesial RIGHT temporooccipital lobe. Patchy faint reduced diffusion RIGHT thalamus. Areas of reduced diffusion demonstrate low ADC values. No susceptibility artifact to suggest hemorrhage. No parenchymal brain volume loss for age. No midline shift, significant  mass effect or masses. A few scattered subcentimeter supratentorial white matter FLAIR T2 hyperintensities compatible with mild chronic small vessel ischemic disease, less than expected for age. No abnormal extra-axial fluid collections. VASCULAR: Normal major intracranial vascular flow voids present at skull base. SKULL AND UPPER CERVICAL SPINE: No abnormal sellar expansion. No suspicious calvarial bone marrow signal. Craniocervical junction maintained. SINUSES/ORBITS: The mastoid air-cells and included paranasal sinuses are well-aerated.The included ocular globes and orbital contents are non-suspicious. Status post bilateral ocular lens implants. OTHER: None. MRA HEAD FINDINGS ANTERIOR CIRCULATION: Normal flow related enhancement of the included cervical, petrous, cavernous and supraclinoid internal carotid arteries. Patent anterior communicating artery. Patent anterior and middle cerebral arteries, mild luminal irregularity compatible with atherosclerosis. No large vessel occlusion, flow limiting stenosis, aneurysm. POSTERIOR CIRCULATION: Codominant vertebral arteries. Basilar artery is patent, with normal flow related enhancement of the main branch vessels. Occluded RIGHT P2 segment without significant reconstitution. Mild luminal irregularity LEFT posterior cerebral artery compatible with atherosclerosis. Patent LEFT posterior cerebral artery. Small bilateral posterior communicating arteries present. No  flow  limiting stenosis,  aneurysm. ANATOMIC VARIANTS: None. Source images and MIP images were reviewed. IMPRESSION: MRI HEAD: 1. Acute nonhemorrhagic RIGHT PCA territory infarcts (RIGHT temporooccipital lobe and RIGHT thalamus). 2. Otherwise negative noncontrast MRI head for age. MRA HEAD: 1. Acute RIGHT posterior cerebral artery occlusion. 2. Mild intracranial atherosclerosis. These results will be called to the ordering clinician or representative by the Radiologist Assistant, and communication documented in  the PACS or zVision Dashboard. Electronically Signed   By: Elon Alas M.D.   On: 01/26/2018 00:02   Mr Jodene Nam Head Wo Contrast  Result Date: 01/26/2018 CLINICAL DATA:  Ataxia and LEFT-sided weakness since yesterday. Suspect stroke. In atrial fibrillation. EXAM: MRI HEAD WITHOUT CONTRAST MRA HEAD WITHOUT CONTRAST TECHNIQUE: Multiplanar, multiecho pulse sequences of the brain and surrounding structures were obtained without intravenous contrast. Angiographic images of the head were obtained using MRA technique without contrast. COMPARISON:  CT HEAD January 25, 2018 FINDINGS: MRI HEAD FINDINGS INTRACRANIAL CONTENTS: Confluent juice diffusion mesial RIGHT temporooccipital lobe. Patchy faint reduced diffusion RIGHT thalamus. Areas of reduced diffusion demonstrate low ADC values. No susceptibility artifact to suggest hemorrhage. No parenchymal brain volume loss for age. No midline shift, significant mass effect or masses. A few scattered subcentimeter supratentorial white matter FLAIR T2 hyperintensities compatible with mild chronic small vessel ischemic disease, less than expected for age. No abnormal extra-axial fluid collections. VASCULAR: Normal major intracranial vascular flow voids present at skull base. SKULL AND UPPER CERVICAL SPINE: No abnormal sellar expansion. No suspicious calvarial bone marrow signal. Craniocervical junction maintained. SINUSES/ORBITS: The mastoid air-cells and included paranasal sinuses are well-aerated.The included ocular globes and orbital contents are non-suspicious. Status post bilateral ocular lens implants. OTHER: None. MRA HEAD FINDINGS ANTERIOR CIRCULATION: Normal flow related enhancement of the included cervical, petrous, cavernous and supraclinoid internal carotid arteries. Patent anterior communicating artery. Patent anterior and middle cerebral arteries, mild luminal irregularity compatible with atherosclerosis. No large vessel occlusion, flow limiting stenosis, aneurysm.  POSTERIOR CIRCULATION: Codominant vertebral arteries. Basilar artery is patent, with normal flow related enhancement of the main branch vessels. Occluded RIGHT P2 segment without significant reconstitution. Mild luminal irregularity LEFT posterior cerebral artery compatible with atherosclerosis. Patent LEFT posterior cerebral artery. Small bilateral posterior communicating arteries present. No  flow limiting stenosis,  aneurysm. ANATOMIC VARIANTS: None. Source images and MIP images were reviewed. IMPRESSION: MRI HEAD: 1. Acute nonhemorrhagic RIGHT PCA territory infarcts (RIGHT temporooccipital lobe and RIGHT thalamus). 2. Otherwise negative noncontrast MRI head for age. MRA HEAD: 1. Acute RIGHT posterior cerebral artery occlusion. 2. Mild intracranial atherosclerosis. These results will be called to the ordering clinician or representative by the Radiologist Assistant, and communication documented in the PACS or zVision Dashboard. Electronically Signed   By: Elon Alas M.D.   On: 01/26/2018 00:02        Scheduled Meds: .  stroke: mapping our early stages of recovery book   Does not apply Once  . apixaban  5 mg Oral BID  . atorvastatin  80 mg Oral q1800  . metoprolol tartrate  25 mg Oral BID  . multivitamin with minerals  1 tablet Oral Daily  . potassium chloride  20 mEq Oral Daily   Continuous Infusions: . diltiazem (CARDIZEM) infusion 10 mg/hr (01/27/18 1151)     LOS: 2 days    Time spent: 25 minutes.    Eloy End PA-S [facility] 01/27/2018, 1:57 PM     Pager [phone] --- please page though AMION:  [email or web address] If 7PM-7AM, please contact night-coverage

## 2018-01-27 NOTE — Progress Notes (Signed)
Subjective:  Patient denies any chest pain or shortness of breath remains in A. Fib with moderate to rapid ventricular response.  Objective:  Vital Signs in the last 24 hours: Temp:  [98.1 F (36.7 C)-98.7 F (37.1 C)] 98.7 F (37.1 C) (04/01 0752) Pulse Rate:  [56-151] 97 (04/01 0921) Resp:  [15-31] 20 (04/01 0752) BP: (99-127)/(59-97) 121/80 (04/01 0752) SpO2:  [91 %-98 %] 95 % (04/01 0752)  Intake/Output from previous day: 03/31 0701 - 04/01 0700 In: 919.5 [I.V.:919.5] Out: 200 [Urine:200] Intake/Output from this shift: No intake/output data recorded.  Physical Exam: Neck: no adenopathy, no carotid bruit, no JVD, supple, symmetrical, trachea midline and  Lungs: clear to auscultation bilaterally Heart: irregularly irregular rhythm, S1, S2 normal and Soft systolic murmur noted Abdomen: soft, non-tender; bowel sounds normal; no masses,  no organomegaly Extremities: extremities normal, atraumatic, no cyanosis or edema  Lab Results: Recent Labs    01/26/18 0312 01/27/18 0418  WBC 9.4 9.7  HGB 11.9* 11.8*  PLT 231 199   Recent Labs    01/26/18 0312 01/27/18 0418  NA 141 143  K 3.0* 3.5  CL 112* 114*  CO2 19* 21*  GLUCOSE 105* 106*  BUN 11 11  CREATININE 0.99 0.90   No results for input(s): TROPONINI in the last 72 hours.  Invalid input(s): CK, MB Hepatic Function Panel Recent Labs    01/25/18 1938  PROT 6.8  ALBUMIN 3.6  AST 24  ALT 11*  ALKPHOS 46  BILITOT 0.8   Recent Labs    01/26/18 0312  CHOL 175   No results for input(s): PROTIME in the last 72 hours.  Imaging: Imaging results have been reviewed and Dg Chest 1 View  Result Date: 01/25/2018 CLINICAL DATA:  Headache and left-sided weakness. Atrial fibrillation. EXAM: CHEST  1 VIEW COMPARISON:  None. FINDINGS: Cardiomegaly without overt pulmonary edema. No pleural effusion or pneumothorax. No focal airspace consolidation. IMPRESSION: Cardiomegaly without overt pulmonary edema. Electronically  Signed   By: Ulyses Jarred M.D.   On: 01/25/2018 19:45   Ct Head Wo Contrast  Result Date: 01/25/2018 CLINICAL DATA:  Headache and left-sided weakness EXAM: CT HEAD WITHOUT CONTRAST TECHNIQUE: Contiguous axial images were obtained from the base of the skull through the vertex without intravenous contrast. COMPARISON:  None. FINDINGS: Brain: Asymmetric hypoattenuation within the right PCA distribution, involving the right occipital and temporal lobes. No acute hemorrhage. No midline shift or mass effect. Normal appearance of the brain parenchyma and extra axial spaces for age. Vascular: No hyperdense vessel or unexpected vascular calcification. Skull: Normal visualized skull base, calvarium and extracranial soft tissues. Sinuses/Orbits: No sinus fluid levels or advanced mucosal thickening. No mastoid effusion. Normal orbits. IMPRESSION: Asymmetric hypoattenuation within the right posterior cerebral artery territory suggest acute to subacute infarct. MRI is recommended for confirmation. No hemorrhage or mass effect. Electronically Signed   By: Ulyses Jarred M.D.   On: 01/25/2018 19:49   Mr Brain Wo Contrast (neuro Protocol)  Result Date: 01/26/2018 CLINICAL DATA:  Ataxia and LEFT-sided weakness since yesterday. Suspect stroke. In atrial fibrillation. EXAM: MRI HEAD WITHOUT CONTRAST MRA HEAD WITHOUT CONTRAST TECHNIQUE: Multiplanar, multiecho pulse sequences of the brain and surrounding structures were obtained without intravenous contrast. Angiographic images of the head were obtained using MRA technique without contrast. COMPARISON:  CT HEAD January 25, 2018 FINDINGS: MRI HEAD FINDINGS INTRACRANIAL CONTENTS: Confluent juice diffusion mesial RIGHT temporooccipital lobe. Patchy faint reduced diffusion RIGHT thalamus. Areas of reduced diffusion demonstrate low ADC values. No  susceptibility artifact to suggest hemorrhage. No parenchymal brain volume loss for age. No midline shift, significant mass effect or masses.  A few scattered subcentimeter supratentorial white matter FLAIR T2 hyperintensities compatible with mild chronic small vessel ischemic disease, less than expected for age. No abnormal extra-axial fluid collections. VASCULAR: Normal major intracranial vascular flow voids present at skull base. SKULL AND UPPER CERVICAL SPINE: No abnormal sellar expansion. No suspicious calvarial bone marrow signal. Craniocervical junction maintained. SINUSES/ORBITS: The mastoid air-cells and included paranasal sinuses are well-aerated.The included ocular globes and orbital contents are non-suspicious. Status post bilateral ocular lens implants. OTHER: None. MRA HEAD FINDINGS ANTERIOR CIRCULATION: Normal flow related enhancement of the included cervical, petrous, cavernous and supraclinoid internal carotid arteries. Patent anterior communicating artery. Patent anterior and middle cerebral arteries, mild luminal irregularity compatible with atherosclerosis. No large vessel occlusion, flow limiting stenosis, aneurysm. POSTERIOR CIRCULATION: Codominant vertebral arteries. Basilar artery is patent, with normal flow related enhancement of the main branch vessels. Occluded RIGHT P2 segment without significant reconstitution. Mild luminal irregularity LEFT posterior cerebral artery compatible with atherosclerosis. Patent LEFT posterior cerebral artery. Small bilateral posterior communicating arteries present. No  flow limiting stenosis,  aneurysm. ANATOMIC VARIANTS: None. Source images and MIP images were reviewed. IMPRESSION: MRI HEAD: 1. Acute nonhemorrhagic RIGHT PCA territory infarcts (RIGHT temporooccipital lobe and RIGHT thalamus). 2. Otherwise negative noncontrast MRI head for age. MRA HEAD: 1. Acute RIGHT posterior cerebral artery occlusion. 2. Mild intracranial atherosclerosis. These results will be called to the ordering clinician or representative by the Radiologist Assistant, and communication documented in the PACS or zVision  Dashboard. Electronically Signed   By: Elon Alas M.D.   On: 01/26/2018 00:02   Mr Jodene Nam Head Wo Contrast  Result Date: 01/26/2018 CLINICAL DATA:  Ataxia and LEFT-sided weakness since yesterday. Suspect stroke. In atrial fibrillation. EXAM: MRI HEAD WITHOUT CONTRAST MRA HEAD WITHOUT CONTRAST TECHNIQUE: Multiplanar, multiecho pulse sequences of the brain and surrounding structures were obtained without intravenous contrast. Angiographic images of the head were obtained using MRA technique without contrast. COMPARISON:  CT HEAD January 25, 2018 FINDINGS: MRI HEAD FINDINGS INTRACRANIAL CONTENTS: Confluent juice diffusion mesial RIGHT temporooccipital lobe. Patchy faint reduced diffusion RIGHT thalamus. Areas of reduced diffusion demonstrate low ADC values. No susceptibility artifact to suggest hemorrhage. No parenchymal brain volume loss for age. No midline shift, significant mass effect or masses. A few scattered subcentimeter supratentorial white matter FLAIR T2 hyperintensities compatible with mild chronic small vessel ischemic disease, less than expected for age. No abnormal extra-axial fluid collections. VASCULAR: Normal major intracranial vascular flow voids present at skull base. SKULL AND UPPER CERVICAL SPINE: No abnormal sellar expansion. No suspicious calvarial bone marrow signal. Craniocervical junction maintained. SINUSES/ORBITS: The mastoid air-cells and included paranasal sinuses are well-aerated.The included ocular globes and orbital contents are non-suspicious. Status post bilateral ocular lens implants. OTHER: None. MRA HEAD FINDINGS ANTERIOR CIRCULATION: Normal flow related enhancement of the included cervical, petrous, cavernous and supraclinoid internal carotid arteries. Patent anterior communicating artery. Patent anterior and middle cerebral arteries, mild luminal irregularity compatible with atherosclerosis. No large vessel occlusion, flow limiting stenosis, aneurysm. POSTERIOR  CIRCULATION: Codominant vertebral arteries. Basilar artery is patent, with normal flow related enhancement of the main branch vessels. Occluded RIGHT P2 segment without significant reconstitution. Mild luminal irregularity LEFT posterior cerebral artery compatible with atherosclerosis. Patent LEFT posterior cerebral artery. Small bilateral posterior communicating arteries present. No  flow limiting stenosis,  aneurysm. ANATOMIC VARIANTS: None. Source images and MIP images were reviewed. IMPRESSION: MRI HEAD:  1. Acute nonhemorrhagic RIGHT PCA territory infarcts (RIGHT temporooccipital lobe and RIGHT thalamus). 2. Otherwise negative noncontrast MRI head for age. MRA HEAD: 1. Acute RIGHT posterior cerebral artery occlusion. 2. Mild intracranial atherosclerosis. These results will be called to the ordering clinician or representative by the Radiologist Assistant, and communication documented in the PACS or zVision Dashboard. Electronically Signed   By: Elon Alas M.D.   On: 01/26/2018 00:02    Cardiac Studies:  Assessment/Plan:  Probably new onset chronic A. 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

## 2018-01-27 NOTE — Evaluation (Signed)
Physical Therapy Evaluation Patient Details Name: Diane Pruitt MRN: [id number] DOB: [birth year] Today's Date: 01/27/2018   History of Present Illness  Patient is a 67 y/o female who presents with left sided weakness and HA. Found to have A-fib with RVR. Brain MRI-Rt PCA infarcts in right temporoccipital/right thalamus. MRA- Rt PCA occlusion. PMH includes HTN.  Clinical Impression  Patient presents with DOE, A-fib with RVR, impaired balance and impaired mobility s/p above. Tolerated gait training with Min guard for safety. HR ranged from 115-161 bpm A-fib with RVR. Limited activity tolerance and endurance due to above. Reports mild weakness from not moving around. Education re: signs/symptoms of stroke. Pt independent PTA and working. Will follow acutely to maximize independence and mobility prior to return home.     Follow Up Recommendations No PT follow up;Supervision - Intermittent    Equipment Recommendations  None recommended by PT    Recommendations for Other Services       Precautions / Restrictions Precautions Precautions: Other (comment) Precaution Comments: watch HR, A-fib Restrictions Weight Bearing Restrictions: No      Mobility  Bed Mobility               General bed mobility comments: Up in chair upon PT arrival.   Transfers Overall transfer level: Needs assistance Equipment used: None Transfers: Sit to/from Stand Sit to Stand: Supervision         General transfer comment: Supervision for safety.   Ambulation/Gait Ambulation/Gait assistance: Min guard Ambulation Distance (Feet): 150 Feet Assistive device: None Gait Pattern/deviations: Step-through pattern;Decreased stride length Gait velocity: decreased   General Gait Details: Slow, mildly unsteady gait with reaching for counter at times. HR ranged from 115-161 bpm A-fib. reports some DOE.  Stairs            Wheelchair Mobility    Modified Rankin (Stroke Patients Only) Modified  Rankin (Stroke Patients Only) Pre-Morbid Rankin Score: No symptoms Modified Rankin: No significant disability     Balance Overall balance assessment: Needs assistance Sitting-balance support: Feet supported;No upper extremity supported Sitting balance-Leahy Scale: Good Sitting balance - Comments: Can reach outside BoS and adjust socks without difficulty.    Standing balance support: During functional activity Standing balance-Leahy Scale: Fair                               Pertinent Vitals/Pain Pain Assessment: No/denies pain    Home Living Family/patient expects to be discharged to:: Private residence Living Arrangements: Alone   Type of Home: House Home Access: Level entry     Home Layout: One level Home Equipment: Shower seat      Prior Function Level of Independence: Independent         Comments: Works as a Scientist, research (medical), drives.      Hand Dominance   Dominant Hand: Left    Extremity/Trunk Assessment   Upper Extremity Assessment Upper Extremity Assessment: Defer to OT evaluation    Lower Extremity Assessment Lower Extremity Assessment: Generalized weakness       Communication   Communication: No difficulties  Cognition Arousal/Alertness: Awake/alert Behavior During Therapy: WFL for tasks assessed/performed Overall Cognitive Status: Within Functional Limits for tasks assessed                                        General Comments      Exercises  Assessment/Plan    PT Assessment Patient needs continued PT services  PT Problem List Decreased strength;Cardiopulmonary status limiting activity;Decreased balance;Decreased activity tolerance       PT Treatment Interventions Balance training;Patient/family education;Gait training;Therapeutic exercise;Therapeutic activities;Neuromuscular re-education    PT Goals (Current goals can be found in the Care Plan section)  Acute Rehab PT Goals Patient Stated Goal: return to  PLOF PT Goal Formulation: With patient Time For Goal Achievement: 02/10/18 Potential to Achieve Goals: Good    Frequency Min 3X/week   Barriers to discharge Decreased caregiver support lives alone    Co-evaluation               AM-PAC PT "6 Clicks" Daily Activity  Outcome Measure Difficulty turning over in bed (including adjusting bedclothes, sheets and blankets)?: None Difficulty moving from lying on back to sitting on the side of the bed? : None Difficulty sitting down on and standing up from a chair with arms (e.g., wheelchair, bedside commode, etc,.)?: None Help needed moving to and from a bed to chair (including a wheelchair)?: None Help needed walking in hospital room?: A Little Help needed climbing 3-5 steps with a railing? : A Little 6 Click Score: 22    End of Session   Activity Tolerance: Treatment limited secondary to medical complications (Comment)(A-fib with RVR) Patient left: in chair;with call bell/phone within reach Nurse Communication: Mobility status PT Visit Diagnosis: Unsteadiness on feet (R26.81);Other abnormalities of gait and mobility (R26.89)    Time: 1751-0258 PT Time Calculation (min) (ACUTE ONLY): 18 min   Charges:   PT Evaluation $PT Eval Moderate Complexity: 1 Mod     PT G CodesWray Kearns, PT, DPT [phone]    Marguarite Arbour A Forney Kleinpeter 01/27/2018, 10:09 AM

## 2018-01-27 NOTE — Evaluation (Signed)
Occupational Therapy Evaluation Patient Details Name: Diane Pruitt MRN: [id number] DOB: 1951/02/09 Today's Date: 01/27/2018    History of Present Illness Patient is a 67 y/o female who presents with left sided weakness and HA. Found to have A-fib with RVR. Brain MRI-Rt PCA infarcts in right temporoccipital/right thalamus. MRA- Rt PCA occlusion. PMH includes HTN.   Clinical Impression   PTA, pt was living alone and was independent with ADLs, IADLs, driving, and working. Pt currently performing ADLs and functional mobility at supervision-Min guard level. Pt presenting with visual deficits in left superior visual field and decreased cognition as seen during higher functioning tasks. During activity, pt HR elevating to 137. Pt would benefit from further acute OT to facilitate safe dc. Recommend dc to home with initial 24 hour supervision and follow up at neuro outpatient for further OT to optimize independence with ADLs/IADLs and return to PLOF.      Follow Up Recommendations  Outpatient OT;Supervision/Assistance - 24 hour(Neuro OP OT)    Equipment Recommendations  None recommended by OT    Recommendations for Other Services       Precautions / Restrictions Precautions Precautions: Other (comment) Precaution Comments: watch HR, A-fib Restrictions Weight Bearing Restrictions: No      Mobility Bed Mobility Overal bed mobility: Needs Assistance Bed Mobility: Supine to Sit     Supine to sit: Supervision     General bed mobility comments: supervision for safety  Transfers Overall transfer level: Needs assistance Equipment used: None Transfers: Sit to/from Stand Sit to Stand: Supervision         General transfer comment: Supervision for safety.     Balance Overall balance assessment: Needs assistance Sitting-balance support: Feet supported;No upper extremity supported Sitting balance-Leahy Scale: Good Sitting balance - Comments: Can reach outside BoS and adjust socks  without difficulty.    Standing balance support: During functional activity Standing balance-Leahy Scale: Fair                             ADL either performed or assessed with clinical judgement   ADL Overall ADL's : Needs assistance/impaired Eating/Feeding: Set up;Sitting   Grooming: Oral care;Set up;Supervision/safety;Standing   Upper Body Bathing: Set up;Sitting;Supervision/ safety   Lower Body Bathing: Min guard;Sit to/from stand   Upper Body Dressing : Set up;Sitting Upper Body Dressing Details (indicate cue type and reason): donning gown like jacket with MIn VCs Lower Body Dressing: Min guard;Sit to/from stand Lower Body Dressing Details (indicate cue type and reason): Able to adjust socks Toilet Transfer: Min guard;Ambulation(Simulated to recliner)           Functional mobility during ADLs: Min guard;Supervision/safety General ADL Comments: Pt demonstrating decreased higher cognition during trail making task and money management task. Discussed with pt that she should not drive until cleared by MD. Pt's daughter present and in agreement. 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 Seen during testing when pt unable to see objects coming through left supervision  field till closer to midline. Also requiring cues to see objest  in left sueprvision field during trail making task. Pt reading navigational signs and not noticing sign on left until cues by OT     Perception     Praxis      Pertinent Vitals/Pain Pain Assessment: No/denies pain     Hand Dominance Left   Extremity/Trunk Assessment Upper Extremity Assessment Upper Extremity Assessment: Generalized weakness   Lower Extremity Assessment Lower Extremity Assessment: Generalized weakness   Cervical / Trunk Assessment Cervical / Trunk Assessment: Normal   Communication Communication Communication: No difficulties   Cognition Arousal/Alertness: Awake/alert Behavior During Therapy: WFL for tasks assessed/performed Overall Cognitive Status: Impaired/Different from baseline Area of Impairment: Memory;Problem solving                     Memory: Decreased short-term memory       Problem Solving: Slow processing;Requires verbal cues General Comments: Pt demonstrating decreased higher functioning for cognition. Including ST memory and problem solving. Pt requiring increased time for money management question with MIn VCs. Requiring Mod-Max cues for trail making task with pt requiring cues to locate navigational signs, correctly reading sign, and then looking for room numbers during mobility   General Comments  Daughter present durign session. HR elevating to 137 during activity    Exercises     Shoulder Instructions      Home Living Family/patient expects to be discharged to:: Private residence Living Arrangements: Alone Available Help at Discharge: Available PRN/intermittently Type of Home: House Home Access: Level entry     Home Layout: One level     Bathroom Shower/Tub: Tub/shower unit;Walk-in Psychologist, prison and probation services: Standard     Home Equipment: Shower seat      Lives With: Alone    Prior Functioning/Environment Level of Independence: Independent        Comments: ADLs, IADLs, drives and works as a Astronomer Problem List: Impaired vision/perception;Impaired balance (sitting and/or standing);Decreased cognition;Decreased knowledge of precautions      OT Treatment/Interventions: Self-care/ADL training;Therapeutic exercise;Energy conservation;DME and/or AE instruction;Therapeutic activities;Patient/family education    OT Goals(Current goals can be found in the care plan section) Acute Rehab OT Goals Patient Stated Goal: return to PLOF OT Goal Formulation: With patient/family Time For Goal Achievement: 02/10/18 Potential to Achieve Goals: Good ADL Goals Additional ADL Goal #1: Pt will perform three ADL tasks at Mod I level Additional ADL Goal #2: Pt will perform four step trail making task with 1-2 VCs Additional ADL Goal #3: Pt will verbalize understanding of three compensatory techniques for visual deficits.  OT Frequency: Min 3X/week   Barriers to D/C:            Co-evaluation              AM-PAC PT "6 Clicks" Daily Activity     Outcome Measure Help from another person eating meals?: None Help from another person taking care of personal grooming?: None Help from another person toileting, which includes using toliet, bedpan, or urinal?: A Little Help from another person bathing (including washing, rinsing, drying)?: A Little Help from another person to put on and taking off regular upper body clothing?: None Help from another person to put on and taking off regular lower body clothing?: A Little 6 Click Score: 21   End of Session Equipment Utilized During Treatment: Gait belt Nurse Communication: Mobility status  Activity Tolerance: Patient tolerated treatment  well Patient left: in bed;with call bell/phone within reach;with bed alarm set;with family/visitor present  OT Visit Diagnosis: Other abnormalities of gait and mobility (R26.89);Low vision, both eyes (H54.2);Other symptoms and signs involving cognitive function                Time: 3790-2409 OT Time Calculation  (min): 32 min Charges:  OT General Charges $OT Visit: 1 Visit OT Evaluation $OT Eval Moderate Complexity: 1 Mod OT Treatments $Self Care/Home Management : 8-22 mins G-Codes:     Jewels Langone MSOT, OTR/L Acute Rehab Pager: [phone] Office: [place] 01/27/2018, 1:37 PM

## 2018-01-27 NOTE — Progress Notes (Signed)
Met pt on iv Cardizem running at 10cc/hr with heart rate of 90s-110s, Rapid Response RN called to clarify pt's orders, advised to continue to monitor at this current rate. Pt also on iv Heparin infusing at 9.5cc, pt denies any pain but requested  for something to sleep, NP Blount (on call) paged and notified, ordered tab Trazodone 77m , same given at 0045, was reassured and will continue to monitor. Diane Pruitt, Diane Pruitt

## 2018-01-27 NOTE — Evaluation (Signed)
Speech Language Pathology Evaluation Patient Details Name: Idella Lamontagne MRN: [id number] DOB: 10-May-1951 Today's Date: 01/27/2018 Time: 1000-1025 SLP Time Calculation (min) (ACUTE ONLY): 25 min  Problem List:  Patient Active Problem List   Diagnosis Date Noted  . Atrial fibrillation with RVR ([place]) 01/25/2018  . Ischemic stroke ([place]) 01/25/2018  . Hypertension 01/25/2018   Past Medical History:  Past Medical History:  Diagnosis Date  . Hypertension    Past Surgical History: History reviewed. No pertinent surgical history. HPI:  67 yo female who lives alone adm to [facility] with HA, blurred vision, left sided weakness/ataxia.  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  Pt is LEFT handed and works part time as a Nutritional therapist.     Assessment / Plan / Recommendation Clinical Impression  MOCA 7.1 administered with pt scoring 27/30 points- WFL.  Areas of challenges included word recall - pt recalled 2 independently, 2 with category cue and did not recognize 3rd word despite choice of 3.  Pt is fluent without dysarthria/aphasia noted.  She also had no focal CN deficits impacting speech/language and reports no deficits in these areas with this event. No SLP follow up indicated - pt and daughter agreeble to plan.      SLP Assessment  SLP Recommendation/Assessment: Patient does not need any further Speech [place] [facility] SLP Visit Diagnosis: Cognitive communication deficit (R41.841)    Follow Up Recommendations  None    Frequency and Duration   n/a        SLP Evaluation Cognition  Overall Cognitive Status: Within Functional Limits for tasks assessed Arousal/Alertness: Awake/alert Orientation Level: Oriented X4 Attention: Sustained;Selective Sustained Attention: Appears intact Selective Attention: Appears intact Memory: Impaired Memory Impairment: Retrieval deficit(2/5 words I, 2/5 with category cue, 1/5 she did not recall) Awareness: Appears  intact Problem Solving: Appears intact Safety/Judgment: Appears intact       Comprehension  Auditory Comprehension Overall Auditory Comprehension: Appears within functional limits for tasks assessed Yes/No Questions: Not tested Commands: Within Functional Limits Conversation: Complex Visual Recognition/Discrimination Discrimination: Not tested Reading Comprehension Reading Status: Within funtional limits    Expression Expression Primary Mode of Expression: Verbal Verbal Expression Overall Verbal Expression: Appears within functional limits for tasks assessed Initiation: No impairment Repetition: No impairment Naming: No impairment Pragmatics: No impairment Non-Verbal Means of Communication: Not applicable Written Expression Dominant Hand: Left Written Expression: Within Functional Limits   Oral / Motor  Oral Motor/Sensory Function Overall Oral Motor/Sensory Function: Within functional limits Motor Speech Overall Motor Speech: Appears within functional limits for tasks assessed Respiration: Within functional limits Resonance: Within functional limits Articulation: Within functional limitis Intelligibility: Intelligible Motor Planning: Witnin functional limits   GO                    Macario Golds 01/27/2018, 10:58 AM  Luanna Salk, [facility] SLP [phone]

## 2018-01-27 NOTE — Progress Notes (Addendum)
STROKE TEAM PROGRESS NOTE   SUBJECTIVE (INTERVAL HISTORY) Her daughter is at the bedside.  She is up in the chair. Reports weak right. No new deficits. Patient with no formal A. Fib. Diagnosis prior to admission though has been suspicious of diagnosis.   OBJECTIVE Vitals:   01/27/18 0431 01/27/18 0752 01/27/18 0914 01/27/18 0921  BP: 119/80 121/80    Pulse: 88 96 (!) 56 97  Resp: (!) 22 20    Temp: 98.5 F (36.9 C) 98.7 F (37.1 C)    TempSrc: Oral Oral    SpO2: 96% 95%    Weight:      Height:        CBC:  Recent Labs  Lab 01/25/18 1938 01/26/18 0312 01/27/18 0418  WBC 10.7* 9.4 9.7  NEUTROABS 8.4*  --   --   HGB 13.0 11.9* 11.8*  HCT 40.0 36.9 37.1  MCV 98.8 97.4 99.2  PLT 237 231 199   CMP     Component Value Date/Time   NA 143 01/27/2018 0418   K 3.5 01/27/2018 0418   CL 114 (H) 01/27/2018 0418   CO2 21 (L) 01/27/2018 0418   GLUCOSE 106 (H) 01/27/2018 0418   BUN 11 01/27/2018 0418   CREATININE 0.90 01/27/2018 0418   CALCIUM 9.8 01/27/2018 0418   PROT 6.8 01/25/2018 1938   ALBUMIN 3.6 01/25/2018 1938   AST 24 01/25/2018 1938   ALT 11 (L) 01/25/2018 1938   ALKPHOS 46 01/25/2018 1938   BILITOT 0.8 01/25/2018 1938   GFRNONAA >60 01/27/2018 0418   GFRAA >60 01/27/2018 0418     PHYSICAL EXAM Pleasant elderly Caucasian lady not in distress. HEENT: [place]/AT Lungs: CLEAR Ext: No edema  Neurologic Examination: Mental Status:  Alert and fully oriented, thought content appropriate. Speech fluent without evidence of aphasia.  Able to follow all commands without difficulty. Cranial Nerves: II:  paritial L upper field cut. Can count fingers. Cannot see movement distal LU field. PERL  III,IV, VI: EOMI without nystagmus. No ptosis.  V,VII: No facial droop. Facial temp sensation equal bilaterally VIII: hearing intact to conversation IX,X: No hypophonia or hoarseness XI: Symmetric XII: midline tongue extension  Motor: Symmetric strength in all 4 extremities  except limited fine finger movements on the left and orbits right over left upper extremity.     Gait: Deferred   ASSESSMENT/PLAN Ms. Laurencia Roma is a 67 y.o. female with history of HTN presenting with L sided weakness.   Stroke:   R PCA infarct embolic secondary to new onset atrial fibrillation   CT head  Hypoattenuation R PCA territory. No hemorrhage  MRI head R PCA territory infarcts  MRA head R PCA occlusion. Mild intracranial atherosclerosis  Carotid Doppler  B ICA 1-39% stenosis, VAs antegrade   2D Echo  - LVEF 60-65%, moderate LVH, mild MR, moderate biatrial enlargment,  dilated IVC.  HgbA1c 5.0  IV heparin for VTE prophylaxis  Fall precautions  Diet Heart Room service appropriate? Yes; Fluid consistency: Thin  aspirin prior to admission, now on heparin IV. As patient able to swallow, will change to eliquis and stop IV heparin. [facility] consulted  Patient counseled to be compliant with her antithrombotic medications  Ongoing aggressive stroke risk factor management  Therapy recommendations:  Outpatient OT  Disposition:  Return home with outpatient OT  Sign off  Atrial Fibrillation with RVR, new  diagnosis  Home anticoagulation:  No antithrombotic   Seen by cardiology (harwani) -  now on IV heparin  and IV Cardizem.  Asked pharmacy to changed to eliquis 5 mg twice a day, discontinue heparin.   Notified Dr. Loleta Books  CHA2DS2-VASc Score = 5 Stroke risk was 7.2% per year in >90,000 patients (the [place] Atrial Fibrillation Cohort Study) and 10.0% risk of stroke/TIA/systemic embolism.  HAS-BLED Score  4    (0-1 low risk, 2 moderate risk, 3+ high risk)  Hypertension History (uncontrolled, SBP>160)   0 Renal Disease (dialysis, transplant, Cr>2.26)   0 Liver Disease (cirrhosis or bilirubin >2x normal or AST/ALT/AP >3x normal)   0 Stroke History   +1 Prior Major Bleeding or Predisposition to Bleeding   0 Labile INR (unstable/high INRs), time in  therapeutic range <60%   0 Age > 65   +1 Medication Usage Predisposing to Bleeding (antiplatelets, NSAIDs)   +1 Alcohol or Drug Usage History (>/= 8 drinks/week)   +1   Estimates risk of major bleeding for pts on anticoagulation for atrial fibrillation HAS BLED SCORE NUMBER OF PATIENTS NUMBER OF BLEEDING BLEEDS PER 100 PATIENT YEARS  4 46 4 8.70  Adapted from Pisters et al.    Hypertension  Stable . Permissive hypertension (OK if < 220/120) but gradually normalize in 5-7 days . Long-term BP goal normotensive  Hyperlipidemia  Home meds:  lipitor 40 mg daily x several years, resumed in hospital  LDL 97, goal < 70  Increased lipitor to 80 mg daily  Continue statin at discharge  Other Stroke Risk Factors  Advanced age  UDS, not performed  ETOH use, level less than 10, advised to drink no more than 1 drink(s) a day  Obesity, Body mass index is 34.75 kg/m., recommend weight loss, diet and exercise as appropriate    NOTHING FURTHER TO ADD FROM THE STROKE STANDPOINT  Patient has a 10-15% risk of having another stroke over the next year, the highest risk is within 2 weeks of the most recent stroke/TIA (risk of having a stroke following a stroke or TIA is the same).  Ongoing risk factor control by Primary Care Physician  Stroke Service will sign off. Please call should any needs arise.  Follow-up Stroke Clinic at [facility] in 4 weeks, order placed.   Hospital day # 2  Burnetta Sabin, MSN, APRN, ANVP-BC, AGPCNP-BC Advanced Practice Stroke Nurse [place] for Schedule & Pager information 01/27/2018 3:05 PM  I have personally examined this patient, reviewed notes, independently viewed imaging studies, participated in medical decision making and plan of care.ROS completed by me personally and pertinent positives fully documented  I have made any additions or clarifications directly to the above note. Agree with note above.  She  has presented with mild left face droop and hemiparesis secondary to large right basal ganglia infarct likely of embolic etiology. Recommend ongoing workup checking TEE and loop recorder. Long discussion the patient and HER-2 daughters at the bedside regarding need for stroke evaluation and answered questions. Continue antiplatelet therapy for now. Discussed with Dr. Loleta Books. Greater than 50% time during this 35 minute visit was spent on counseling and coordination of care about embolic stroke and need for evaluation and discussion about treatment options and answering questions. Stroke team will sign off. Kindly call for questions. Follow-up in the stroke clinic in 6 weeks Antony Contras, MD Medical Director [facility] Pager: [phone] 01/27/2018 5:14 PM To contact Stroke Continuity provider, please refer to [email or web address] After hours, contact General Neurology

## 2018-01-27 NOTE — Progress Notes (Addendum)
ANTICOAGULATION CONSULT NOTE  Pharmacy Consult:  Heparin Indication: atrial fibrillation  No Known Allergies  Patient Measurements: Height: 5\' 2"  (157.5 cm) Weight: 190 lb (86.2 kg) IBW/kg (Calculated) : 50.1 Heparin Dosing Weight: 70 kg  Vital Signs: Temp: 98.7 F (37.1 C) (04/01 0752) Temp Source: Oral (04/01 0752) BP: 121/80 (04/01 0752) Pulse Rate: 97 (04/01 0921)  Labs: Recent Labs    01/25/18 1938 01/26/18 0312 01/26/18 2050 01/27/18 0418  HGB 13.0 11.9*  --  11.8*  HCT 40.0 36.9  --  37.1  PLT 237 231  --  199  APTT 29  --   --   --   LABPROT 14.1  --   --   --   INR 1.10  --   --   --   HEPARINUNFRC  --   --  0.32 0.32  CREATININE 1.03* 0.99  --  0.90    Estimated Creatinine Clearance: 62.6 mL/min (by C-G formula based on SCr of 0.9 mg/dL).   Assessment: 26 YOF presents on 01/25/18 with left sided weakness, found to have ischemic stroke.  Patient has a history of Afib but not on any anticoagulation (CHADsVASC = 5).  Pharmacy has been consulted for IV heparin dosing.  Heparin level remains therapeutic.  No bleeding reported.   Goal of Therapy:  Heparin level 0.3-0.5 units/mL, no bolus Monitor platelets by anticoagulation protocol: Yes    Plan:  Continue heparin gtt at 950 units/hr Daily heparin level and CBC F/U with long-term anticoagulation plan   Diane Pruitt D. Mina Marble, PharmD, BCPS Pager:  [phone] 01/27/2018, 10:40 AM    ==========================   Addendum: Transition from IV heparin to Eliquis Age < 80, weight > 60 kg, SCr < 1.5 Eliquis 5mg  PO BID.  Stop heparin when Eliquis is administered. Pharmacy will sign off and follow peripherally.  Thank you for the consult!   Dajuan Turnley D. Mina Marble, PharmD, BCPS Pager:  [phone] 01/27/2018, 11:36 AM

## 2018-01-27 NOTE — Progress Notes (Signed)
PROGRESS NOTE    Sharmon Pruitt  KNL:[id number] DOB: 10/29/1951 DOA: 01/25/2018 PCP: [name], MD      Brief Narrative:  [name] is a 67 y.o. F with HTN who presents with acute onset left hemiparesis.  The night of admission, patient had been in her usual health until 2AM woke with headache and vision blurring, then short while later noticed left sided weakness and so presented to the ER.    On arrival, ECG showed new onset Afib rate 140s as well as subacute infarct on CT head.  She was outside the tPA window and so admitted to the hospitalist service for stroke and new AFib with Neurology and Cardiology consulting.      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   -Change to Eliquis -Continue diltiazem gtt -Add metoprolol PO  Hypertension -Hold home ACEi, HCTZ for now  Normocytic anemia Mild, appears chronic.        DVT prophylaxis: N/A Code Status: FULL Family Communication: Daughter at bedside MDM and disposition Plan: The below labs and imaging reports were reviewed.  The patient's status is clinically improving. She was admitted with Afib with RVR new and acute stroke.  We have adjusted her anticoagulation to oral format, continued this and added IV medicine for rate control.    PT and OT recommend outpatient OT therapy only.   Consultants:   Neurology  Cardiology  Procedures:   CT head  MRI brain MRA head   Echo 3/31 Study Conclusions  - Left ventricle: The cavity size was normal. There was moderate   concentric hypertrophy. Systolic function was normal. The   estimated ejection fraction was in the range of 60% to 65%. The   study is not technically sufficient to allow evaluation of LV   diastolic function. - Mitral valve: Calcified annulus. Mildly thickened leaflets .   There was mild regurgitation. - Left atrium: Moderately  dilated. - Right atrium: Moderately dilated. - Inferior vena cava: The vessel was dilated. The respirophasic   diameter changes were blunted (< 50%), consistent with elevated   central venous pressure. Impressions: - LVEF 60-65%, moderate LVH, mild MR, moderate biatrial enlargment,   dilated IVC.    Carotid US 4/1   Antimicrobials:   None    Subjective: Feels well.  Hemiparesis is resolved.  Vision deficits remain.  No chset pain, palpiations.  Mild dyspnea with exertion noted.  No orthopnea or leg swelling.  Objective: Vitals:   01/27/18 0914 01/27/18 0921 01/27/18 1203 01/27/18 1630  BP:   103/74 107/68  Pulse: (!) 56 97 (!) 115 83  Resp:   18 16  Temp:   98.6 F (37 C) 98.2 F (36.8 C)  TempSrc:   Oral Oral  SpO2:   99% 98%  Weight:      Height:        Intake/Output Summary (Last 24 hours) at 01/27/2018 1820 Last data filed at 01/27/2018 1210 Gross per 24 hour  Intake 744.47 ml  Output -  Net 744.47 ml   Filed Weights   01/25/18 1937  Weight: 86.2 kg (190 lb)    Examination: General appearance: Well-nourished adult female, alert and in noacute distress.   HEENT: Anicteric, conjunctiva pink, lids and lashes normal. No nasal deformity, discharge, epistaxis.  Lips moist.   Skin: Warm and dry.  No jaundice.  No suspicious rashes or lesions. Cardiac: Irregular, tachcyardic,  nl S1-S2, no murmurs appreciated.  Capillary refill is brisk.  No LE edema. Respiratory: Normal respiratory rate and rhythm.  CTAB without rales or wheezes. Abdomen: Abdomen soft.  No TTP. No ascites, distension, hepatosplenomegaly.   MSK: No deformities or effusions. Neuro: Awake and alert.  EOMI, moves all extremities. Speech fluent.    Psych: Sensorium intact and responding to questions, attention normal. Affect normal.  Judgment and insight appear normal.    Data Reviewed: I have personally reviewed following labs and imaging studies:  CBC: Recent Labs  Lab 01/25/18 1938  01/26/18 0312 01/27/18 0418  WBC 10.7* 9.4 9.7  NEUTROABS 8.4*  --   --   HGB 13.0 11.9* 11.8*  HCT 40.0 36.9 37.1  MCV 98.8 97.4 99.2  PLT 237 231 009   Basic Metabolic Panel: Recent Labs  Lab 01/25/18 1938 01/26/18 0312 01/27/18 0418  NA 142 141 143  K 3.6 3.0* 3.5  CL 109 112* 114*  CO2 21* 19* 21*  GLUCOSE 114* 105* 106*  BUN 12 11 11   CREATININE 1.03* 0.99 0.90  CALCIUM 9.5 9.0 9.8   GFR: Estimated Creatinine Clearance: 62.6 mL/min (by C-G formula based on SCr of 0.9 mg/dL). Liver Function Tests: Recent Labs  Lab 01/25/18 1938  AST 24  ALT 11*  ALKPHOS 46  BILITOT 0.8  PROT 6.8  ALBUMIN 3.6   No results for input(s): LIPASE, AMYLASE in the last 168 hours. No results for input(s): AMMONIA in the last 168 hours. Coagulation Profile: Recent Labs  Lab 01/25/18 1938  INR 1.10   Cardiac Enzymes: No results for input(s): CKTOTAL, CKMB, CKMBINDEX, TROPONINI in the last 168 hours. BNP (last 3 results) No results for input(s): PROBNP in the last 8760 hours. HbA1C: Recent Labs    01/26/18 0312  HGBA1C 5.0   CBG: No results for input(s): GLUCAP in the last 168 hours. Lipid Profile: Recent Labs    01/26/18 0312  CHOL 175  HDL 36*  LDLCALC 97  TRIG 212*  CHOLHDL 4.9   Thyroid Function Tests: No results for input(s): TSH, T4TOTAL, FREET4, T3FREE, THYROIDAB in the last 72 hours. Anemia Panel: No results for input(s): VITAMINB12, FOLATE, FERRITIN, TIBC, IRON, RETICCTPCT in the last 72 hours. Urine analysis:    Component Value Date/Time   COLORURINE YELLOW 01/25/2018 1856   APPEARANCEUR CLEAR 01/25/2018 1856   LABSPEC 1.010 01/25/2018 1856   PHURINE 6.0 01/25/2018 1856   GLUCOSEU NEGATIVE 01/25/2018 1856   HGBUR NEGATIVE 01/25/2018 1856   BILIRUBINUR NEGATIVE 01/25/2018 1856   KETONESUR NEGATIVE 01/25/2018 1856   PROTEINUR NEGATIVE 01/25/2018 1856   NITRITE NEGATIVE 01/25/2018 1856   LEUKOCYTESUR TRACE (A) 01/25/2018 1856   Sepsis  Labs: @LABRCNTIP (procalcitonin:4,lacticacidven:4)  ) Recent Results (from the past 240 hour(s))  MRSA PCR Screening     Status: None   Collection Time: 01/26/18  4:53 PM  Result Value Ref Range Status   MRSA by PCR NEGATIVE NEGATIVE Final    Comment:        The GeneXpert MRSA Assay (FDA approved for NASAL specimens only), is one component of a comprehensive MRSA colonization surveillance program. It is not intended to diagnose MRSA infection nor to guide or monitor treatment for MRSA infections. Performed at [facility], [place] [address], [place], [place] [address]          Radiology Studies: Dg Chest 1 View  Result Date: 01/25/2018 CLINICAL DATA:  Headache and left-sided weakness. Atrial fibrillation. EXAM: CHEST  1 VIEW COMPARISON:  None. FINDINGS: Cardiomegaly without overt pulmonary edema. No pleural effusion or pneumothorax. No focal airspace consolidation. IMPRESSION: Cardiomegaly without overt pulmonary edema. Electronically Signed   By: Ulyses Jarred M.D.   On: 01/25/2018 19:45   Ct Head Wo Contrast  Result Date: 01/25/2018 CLINICAL DATA:  Headache and left-sided weakness EXAM: CT HEAD WITHOUT CONTRAST TECHNIQUE: Contiguous axial images were obtained from the base of the skull through the vertex without intravenous contrast. COMPARISON:  None. FINDINGS: Brain: Asymmetric hypoattenuation within the right PCA distribution, involving the right occipital and temporal lobes. No acute hemorrhage. No midline shift or mass effect. Normal appearance of the brain parenchyma and extra axial spaces for age. Vascular: No hyperdense vessel or unexpected vascular calcification. Skull: Normal visualized skull base, calvarium and extracranial soft tissues. Sinuses/Orbits: No sinus fluid levels or advanced mucosal thickening. No mastoid effusion. Normal orbits. IMPRESSION: Asymmetric hypoattenuation within the right posterior cerebral artery territory suggest acute to subacute infarct.  MRI is recommended for confirmation. No hemorrhage or mass effect. Electronically Signed   By: Ulyses Jarred M.D.   On: 01/25/2018 19:49   Mr Brain Wo Contrast (neuro Protocol)  Result Date: 01/26/2018 CLINICAL DATA:  Ataxia and LEFT-sided weakness since yesterday. Suspect stroke. In atrial fibrillation. EXAM: MRI HEAD WITHOUT CONTRAST MRA HEAD WITHOUT CONTRAST TECHNIQUE: Multiplanar, multiecho pulse sequences of the brain and surrounding structures were obtained without intravenous contrast. Angiographic images of the head were obtained using MRA technique without contrast. COMPARISON:  CT HEAD January 25, 2018 FINDINGS: MRI HEAD FINDINGS INTRACRANIAL CONTENTS: Confluent juice diffusion mesial RIGHT temporooccipital lobe. Patchy faint reduced diffusion RIGHT thalamus. Areas of reduced diffusion demonstrate low ADC values. No susceptibility artifact to suggest hemorrhage. No parenchymal brain volume loss for age. No midline shift, significant mass effect or masses. A few scattered subcentimeter supratentorial white matter FLAIR T2 hyperintensities compatible with mild chronic small vessel ischemic disease, less than expected for age. No abnormal extra-axial fluid collections. VASCULAR: Normal major intracranial vascular flow voids present at skull base. SKULL AND UPPER CERVICAL SPINE: No abnormal sellar expansion. No suspicious calvarial bone marrow signal. Craniocervical junction maintained. SINUSES/ORBITS: The mastoid air-cells and included paranasal sinuses are well-aerated.The included ocular globes and orbital contents are non-suspicious. Status post bilateral ocular lens implants. OTHER: None. MRA HEAD FINDINGS ANTERIOR CIRCULATION: Normal flow related enhancement of the included cervical, petrous, cavernous and supraclinoid internal carotid arteries. Patent anterior communicating artery. Patent anterior and middle cerebral arteries, mild luminal irregularity compatible with atherosclerosis. No large vessel  occlusion, flow limiting stenosis, aneurysm. POSTERIOR CIRCULATION: Codominant vertebral arteries. Basilar artery is patent, with normal flow related enhancement of the main branch vessels. Occluded RIGHT P2 segment without significant reconstitution. Mild luminal irregularity LEFT posterior cerebral artery compatible with atherosclerosis. Patent LEFT posterior cerebral artery. Small bilateral posterior communicating arteries present. No  flow limiting stenosis,  aneurysm. ANATOMIC VARIANTS: None. Source images and MIP images were reviewed. IMPRESSION: MRI HEAD: 1. Acute nonhemorrhagic RIGHT PCA territory infarcts (RIGHT temporooccipital lobe and RIGHT thalamus). 2. Otherwise negative noncontrast MRI head for age. MRA HEAD: 1. Acute RIGHT posterior cerebral artery occlusion. 2. Mild intracranial atherosclerosis. These results will be called to the ordering clinician or representative by the Radiologist Assistant, and communication documented in the PACS or zVision Dashboard. Electronically Signed   By: Elon Alas M.D.   On: 01/26/2018 00:02   Mr Jodene Nam Head Wo Contrast  Result Date: 01/26/2018 CLINICAL DATA:  Ataxia and LEFT-sided weakness since yesterday. Suspect stroke. In atrial fibrillation. EXAM: MRI  HEAD WITHOUT CONTRAST MRA HEAD WITHOUT CONTRAST TECHNIQUE: Multiplanar, multiecho pulse sequences of the brain and surrounding structures were obtained without intravenous contrast. Angiographic images of the head were obtained using MRA technique without contrast. COMPARISON:  CT HEAD January 25, 2018 FINDINGS: MRI HEAD FINDINGS INTRACRANIAL CONTENTS: Confluent juice diffusion mesial RIGHT temporooccipital lobe. Patchy faint reduced diffusion RIGHT thalamus. Areas of reduced diffusion demonstrate low ADC values. No susceptibility artifact to suggest hemorrhage. No parenchymal brain volume loss for age. No midline shift, significant mass effect or masses. A few scattered subcentimeter supratentorial white  matter FLAIR T2 hyperintensities compatible with mild chronic small vessel ischemic disease, less than expected for age. No abnormal extra-axial fluid collections. VASCULAR: Normal major intracranial vascular flow voids present at skull base. SKULL AND UPPER CERVICAL SPINE: No abnormal sellar expansion. No suspicious calvarial bone marrow signal. Craniocervical junction maintained. SINUSES/ORBITS: The mastoid air-cells and included paranasal sinuses are well-aerated.The included ocular globes and orbital contents are non-suspicious. Status post bilateral ocular lens implants. OTHER: None. MRA HEAD FINDINGS ANTERIOR CIRCULATION: Normal flow related enhancement of the included cervical, petrous, cavernous and supraclinoid internal carotid arteries. Patent anterior communicating artery. Patent anterior and middle cerebral arteries, mild luminal irregularity compatible with atherosclerosis. No large vessel occlusion, flow limiting stenosis, aneurysm. POSTERIOR CIRCULATION: Codominant vertebral arteries. Basilar artery is patent, with normal flow related enhancement of the main branch vessels. Occluded RIGHT P2 segment without significant reconstitution. Mild luminal irregularity LEFT posterior cerebral artery compatible with atherosclerosis. Patent LEFT posterior cerebral artery. Small bilateral posterior communicating arteries present. No  flow limiting stenosis,  aneurysm. ANATOMIC VARIANTS: None. Source images and MIP images were reviewed. IMPRESSION: MRI HEAD: 1. Acute nonhemorrhagic RIGHT PCA territory infarcts (RIGHT temporooccipital lobe and RIGHT thalamus). 2. Otherwise negative noncontrast MRI head for age. MRA HEAD: 1. Acute RIGHT posterior cerebral artery occlusion. 2. Mild intracranial atherosclerosis. These results will be called to the ordering clinician or representative by the Radiologist Assistant, and communication documented in the PACS or zVision Dashboard. Electronically Signed   By: Elon Alas M.D.   On: 01/26/2018 00:02        Scheduled Meds: .  stroke: mapping our early stages of recovery book   Does not apply Once  . apixaban  5 mg Oral BID  . atorvastatin  80 mg Oral q1800  . metoprolol tartrate  25 mg Oral BID  . multivitamin with minerals  1 tablet Oral Daily  . potassium chloride  20 mEq Oral Daily   Continuous Infusions: . diltiazem (CARDIZEM) infusion 10 mg/hr (01/27/18 1151)     LOS: 2 days    Time spent: 25 minutes    Edwin Dada, MD [facility] 01/27/2018, 1:19 PM     Pager [phone] --- please page though AMION:  [email or web address] Password [email or web address] If 7PM-7AM, please contact night-coverage

## 2018-01-27 NOTE — Discharge Instructions (Signed)

## 2018-01-28 MED ORDER — METOPROLOL TARTRATE 50 MG PO TABS: 50.0000 mg | ORAL_TABLET | Freq: Two times a day (BID) | ORAL | 6 refills | Status: DC

## 2018-01-28 MED ORDER — ATORVASTATIN CALCIUM 80 MG PO TABS: 80.0000 mg | ORAL_TABLET | Freq: Every day | ORAL | 6 refills | Status: DC

## 2018-01-28 MED ORDER — METOPROLOL TARTRATE 50 MG PO TABS: 50.0000 mg | ORAL_TABLET | Freq: Two times a day (BID) | ORAL | Status: DC

## 2018-01-28 MED ORDER — APIXABAN 5 MG PO TABS: 5.0000 mg | ORAL_TABLET | Freq: Two times a day (BID) | ORAL | 6 refills | Status: DC

## 2018-01-28 NOTE — Progress Notes (Signed)
Discharge instructions reviewed with patient/family. All questions answered at this time. Transport home by family.   Natanya Holecek, RN 

## 2018-01-28 NOTE — Progress Notes (Signed)
Pt's HR observed to be in the 60s - 80s, with BP of 93/58, Cardizem drip infusing at 10cc, NP Forrest Moron (on call) paged and notified, ordered to reduce rate to 5cc/hr and observe v/s for about one to two hours, if stable, then Cardizem drip can be turned off, drip rate changed at 0539, will continue to monitor. Obasogie-Asidi, Freman Lapage Efe

## 2018-01-28 NOTE — Progress Notes (Addendum)
Pt started on Eliquis 5 mg BID. CM did a benefits check to see cost for the patient:    S/W LOLA @ [employer] # [phone]    ELIQUIS  5 MG BID  COVER: NOT COVER ( [place] )  CO-PAY- $ 140.00  TIER- 3 DRUG  PRIOR APPROVAL- NO   PREFERRED PHARMACY : [facility]   CM spoke to [employer] and asked for Tier exception ([id number]) for the medication. Will hear results within 3 days. They will notify the patient.

## 2018-01-28 NOTE — Progress Notes (Signed)
Subjective:  Inguinal denies any complaints.  No further weakness in the arms or legs.  Remains in A. Fib with moderate ventricular response  Objective:  Vital Signs in the last 24 hours: Temp:  [98 F (36.7 C)-98.5 F (36.9 C)] 98.3 F (36.8 C) (04/02 0800) Pulse Rate:  [53-109] 87 (04/02 1144) Resp:  [16-18] 18 (04/02 1144) BP: (91-113)/(58-83) 107/83 (04/02 1144) SpO2:  [94 %-99 %] 95 % (04/02 1144)  Intake/Output from previous day: 04/01 0701 - 04/02 0700 In: 807.1 [P.O.:480; I.V.:327.1] Out: -  Intake/Output from this shift: Total I/O In: 240 [P.O.:240] Out: -   Physical Exam: Neck: no adenopathy, no carotid bruit, no JVD and supple, symmetrical, trachea midline Lungs: clear to auscultation bilaterally Heart: irregularly irregular rhythm, S1, S2 normal and soft systolic murmur noted Abdomen: soft, non-tender; bowel sounds normal; no masses,  no organomegaly Extremities: extremities normal, atraumatic, no cyanosis or edema  Lab Results: Recent Labs    01/26/18 0312 01/27/18 0418  WBC 9.4 9.7  HGB 11.9* 11.8*  PLT 231 199   Recent Labs    01/26/18 0312 01/27/18 0418  NA 141 143  K 3.0* 3.5  CL 112* 114*  CO2 19* 21*  GLUCOSE 105* 106*  BUN 11 11  CREATININE 0.99 0.90   No results for input(s): TROPONINI in the last 72 hours.  Invalid input(s): CK, MB Hepatic Function Panel Recent Labs    01/25/18 1938  PROT 6.8  ALBUMIN 3.6  AST 24  ALT 11*  ALKPHOS 46  BILITOT 0.8   Recent Labs    01/26/18 0312  CHOL 175   No results for input(s): PROTIME in the last 72 hours.  Imaging: Imaging results have been reviewed and No results found.  Cardiac Studies:  Assessment/Plan:  Probably new onset chronic A. Fib with RVR Chadsvasc score of 5 Acute right PCA infarct (temporo-occipital lobe and right thalamic infarct) probably cardioembolic Hypertension Glucose intolerance Hyperlipidemia Morbid obesity Plan Increase Lopressor as per orders. Wean  off Cardizem. Okay to discharge from cardiac point of view. Follow-up with me in one to 2 weeks   LOS: 3 days    Charolette Forward 01/28/2018, 1:13 PM

## 2018-01-28 NOTE — Progress Notes (Signed)
Physical Therapy Treatment Patient Details Name: Diane Pruitt MRN: [id number] DOB: September 04, 1951 Today's Date: 01/28/2018    History of Present Illness Patient is a 67 y/o female who presents with left sided weakness and HA. Found to have A-fib with RVR. Brain MRI-Rt PCA infarcts in right temporoccipital/right thalamus. MRA- Rt PCA occlusion. PMH includes HTN.    PT Comments    Patient progressing with mobility and demonstrates low risk for falls with static balance test with score 51/56 on Berg balance assessment.  Does drift to sides with ambulation, though no LOB.  May benefit from follow up outpatient PT for dynamic gait and assisting with progressing activity safely for risk factor modification if cleared by MD due to a-fib with rate to 151 today.    Follow Up Recommendations  Outpatient PT(if doing outpatient OT, could benefit from PT for progression of activity and dynamic gait tasks)     Equipment Recommendations  None recommended by PT    Recommendations for Other Services       Precautions / Restrictions Precautions Precautions: Other (comment) Precaution Comments: watch HR, A-fib    Mobility  Bed Mobility         Supine to sit: Modified independent (Device/Increase time)        Transfers Overall transfer level: Modified independent                  Ambulation/Gait Ambulation/Gait assistance: Supervision Ambulation Distance (Feet): 300 Feet Assistive device: None Gait Pattern/deviations: Step-through pattern;Drifts right/left;Decreased stride length     General Gait Details: HR max 151   Stairs            Wheelchair Mobility    Modified Rankin (Stroke Patients Only) Modified Rankin (Stroke Patients Only) Pre-Morbid Rankin Score: No symptoms Modified Rankin: Slight disability     Balance Overall balance assessment: Needs assistance   Sitting balance-Leahy Scale: Good       Standing balance-Leahy Scale: Good                    Standardized Balance Assessment Standardized Balance Assessment : Berg Balance Test Berg Balance Test Sit to Stand: Able to stand without using hands and stabilize independently Standing Unsupported: Able to stand safely 2 minutes Sitting with Back Unsupported but Feet Supported on Floor or Stool: Able to sit safely and securely 2 minutes Stand to Sit: Sits safely with minimal use of hands Transfers: Able to transfer safely, minor use of hands Standing Unsupported with Eyes Closed: Able to stand 10 seconds safely Standing Ubsupported with Feet Together: Able to place feet together independently and stand 1 minute safely From Standing, Reach Forward with Outstretched Arm: Can reach confidently >25 cm (10") From Standing Position, Pick up Object from Floor: Able to pick up shoe safely and easily From Standing Position, Turn to Look Behind Over each Shoulder: Looks behind from both sides and weight shifts well Turn 360 Degrees: Able to turn 360 degrees safely one side only in 4 seconds or less Standing Unsupported, Alternately Place Feet on Step/Stool: Able to stand independently and safely and complete 8 steps in 20 seconds Standing Unsupported, One Foot in Front: Able to plae foot ahead of the other independently and hold 30 seconds Standing on One Leg: Tries to lift leg/unable to hold 3 seconds but remains standing independently Total Score: 51        Cognition Arousal/Alertness: Awake/alert Behavior During Therapy: WFL for tasks assessed/performed Overall Cognitive Status: Impaired/Different from baseline  Memory: Decreased short-term memory       Problem Solving: Requires verbal cues;Slow processing        Exercises      General Comments        Pertinent Vitals/Pain Pain Assessment: No/denies pain    Home Living                      Prior Function            PT Goals (current goals can now be found in the care  plan section) Progress towards PT goals: Progressing toward goals    Frequency    Min 3X/week      PT Plan Current plan remains appropriate    Co-evaluation              AM-PAC PT "6 Clicks" Daily Activity  Outcome Measure  Difficulty turning over in bed (including adjusting bedclothes, sheets and blankets)?: None Difficulty moving from lying on back to sitting on the side of the bed? : None Difficulty sitting down on and standing up from a chair with arms (e.g., wheelchair, bedside commode, etc,.)?: None Help needed moving to and from a bed to chair (including a wheelchair)?: None Help needed walking in hospital room?: A Little Help needed climbing 3-5 steps with a railing? : A Little 6 Click Score: 22    End of Session Equipment Utilized During Treatment: Gait belt Activity Tolerance: Patient tolerated treatment well Patient left: in bed;with call bell/phone within reach   PT Visit Diagnosis: Unsteadiness on feet (R26.81);Other abnormalities of gait and mobility (R26.89)     Time: 0712-1975 PT Time Calculation (min) (ACUTE ONLY): 27 min  Charges:  $Gait Training: 8-22 mins $Neuromuscular Re-education: 8-22 mins                    G CodesMagda Kiel, [place] [phone] 01/28/2018    [name] 01/28/2018, 4:53 PM

## 2018-01-28 NOTE — Progress Notes (Signed)
PROGRESS NOTE    Diane Pruitt          NFA:[id number] DOB: 18-May-1951 DOA: 01/25/2018 PCP: [name], MD   Subjective: CC: Left sided hemiparesis with blurry vision.  HPI: Diane Pruitt is a 67 year old female who presented to the ED via EMS for left arm and leg weakness. She awoke to use the restroom on Friday night at 2 AM and noticed she had a headache and blurry vision which is atypical for her, she took Tylenol and went back to sleep and awoke at 4 PM on Saturday. At this time she noticed left arm and leg weakness and called EMS. Per EMS stroke screen was negative, patient was outside window for tPA. EMS noted her to have atrial fibrillation with RVR. History of HTN previously on Lisinopril-HCTZ (being held while admitted) and hyperlipidemia on atorvastatin.  MEDS:  Diltiazem drip 100 mg in dextrose 5%, 5 mg/hr titrated  Eliquis 36m PO BID  Atorvastatin 80 mg PO QD  Metoprolol Tartrate 50 mg PO BID  Multivitamin 1 tab daily  Senna-docusate tablet 1 tab QHS PRN  Trazodone 50 mg QHS PRN  ALLERGIES: NKDA.  ROS: Constitutional: Denies fever, chills, fatigue. Head: Denies headache, dizziness, lightheadedness, change in vision and syncope. Cardiology: Denies chest pain, palpitations, orthopnea and leg edema. Pulmonology: Positive mild dyspnea with exertion. Denies cough, shortness of breath, wheezing. Abdomen: Denies diarrhea, constipation, abdominal pain, hematochezia and hematemesis. GU: Denies polyuria, polydipsia, dysuria and hematuria. MSK: Denies arthralgias or myalgias. Psych: Denies anxiety or depression.  Objective: Vital Signs in the last 24 hours: Temp:  [98 F (36.7 C)-98.5 F (36.9 C)] 98.3 F (36.8 C) (04/02 0800) Pulse Rate:  [53-109] 87 (04/02 1144) Resp:  [16-18] 18 (04/02 1144) BP: (91-113)/(58-83) 107/83 (04/02 1144) SpO2:  [94 %-99 %] 95 % (04/02 1144)  Physical Exam: General appearance: Well-appearing adult female, alert and in no  distress. No gross abnormalities. HEENT: Anicteric, conjunctiva pink, lids and lashes normal. No nasal deformity, discharge, epistaxis.  Lips moist.  Skin: Warm and dry.  No jaundice.  No suspicious rashes or lesions. Cardiac: Irregularly irregular, nl S1-S2, no murmurs appreciated.  Capillary refill is brisk.  JVP normal and not visible.  No LE edema.  Radial pulses 2+ and symmetric. Respiratory: Normal respiratory rate and rhythm.  CTAB without rales or wheezes. Abdomen: Abdomen soft and non-tender to palpation. No ascites, distension, hepatosplenomegaly.   MSK: No deformities or effusions. Neuro: Awake and alert.  EOMI, moves all extremities. Speech fluent. Cranial nerves intact. Psych: Sensorium intact and responding to questions, attention normal. Affect normal.  Judgment and insight appear normal.   Assessment: Patient is awake and resting comfortably in bed, alert and oriented. Hemiparesis resolved. Ambulating with some assistance due to gait difficulty, still having mild bilateral hemianopsia although slowly improving. Continues to be experiencing mild dyspnea with exertion. She is cleared to discharge home with self care as per PT although OT did recommend further acute OT, CM met with the patient and [facility] discharge from cardiology and neurology with follow up by PCP in one week and [facility]. I agree with the above and patient is clear for discharge.   Plan: I have encouraged the patient to follow up with her PCP in the following week for further evaluation and treatment of the conditions below.  Ischemic Stroke -Discontinue Diltiazem -Continue Eliquis 5 mg PO BID -Patient to follow up  with [facility] in 4 weeks  Atrial Fibrillation -Discontinue Diltiazem -Continue Metoprolol tartrate 50 mg PO BID -Encouraged patient to establish care with a  cardiologist  Hypertension -Advised against resuming Lisinopril-HCTZ while on Metoprolol for atrial fibrillation as metoprolol will also help with hypertension. -Patient to monitor blood pressure at home. -PCP will continue to evaluate and treat BP.  Hyperlipidemia -Continue Atorvastatin 80 mg PO QD  Eloy End PA-S 01/28/2018

## 2018-01-28 NOTE — Care Management Note (Signed)
Case Management Note  Patient Details  Name: Shagun Wordell MRN: [id number] Date of Birth: 1951-10-24  Subjective/Objective:    Pt admitted with CVA. She is from home alone. Pt had supportive daughter.                 Action/Plan: Plan is for pt to d/c home with self care. CM consulted for outpatient OT. CM met with the patient and she would like to attend [facility]. Orders in Epic and information on the AVS.  Pt discharging home on Eliquis. CM did a benefits check with patients co pay being $140/ month. CM called her RX insurance and requested a tier exception for the medication. Eliquis is a Tier 3 currently. [employer] will contact the patient within the next 3 days with the decision. Patient to call Dr Sabra Heck or Dr Terrence Dupont if they deny the Tier exception. She and daughter are aware.  Daughter to provide transportation home and supervision at home.     Expected Discharge Date:                  Expected Discharge Plan:  OP Rehab  In-House Referral:     Discharge planning Services  CM Consult  Post Acute Care Choice:    Choice offered to:     DME Arranged:    DME Agency:     HH Arranged:    [place] Agency:     Status of Service:  Completed, signed off  If discussed at [employer] of Stay Meetings, dates discussed:    Additional Comments:  Pollie Friar, RN 01/28/2018, 3:03 PM

## 2018-02-05 NOTE — Discharge Summary (Addendum)
Physician Discharge Summary  Diane Pruitt KYH:[id number] DOB: 16-Feb-1951 DOA: 01/25/2018  PCP: [name], MD  Admit date: 01/25/2018 Discharge date: 02/05/2018  Admitted From: Home  Disposition:  Home  Recommendations for Outpatient Follow-up:  1. Follow up with PCP in 1-2 weeks 2. Please follow up with Cardiology in 1 week 3. Please obtain BMP/CBC in one week   Home Health: None  Equipment/Devices: None  Discharge Condition: Good  CODE STATUS: Full Diet recommendation: Cardiac  Brief/Interim Summary: Diane Pruitt is a 67 y.o. F with HTN who presents with acute onset left hemiparesis.  The night of admission, patient had been in her usual health until 2AM woke with headache and vision blurring, then short while later noticed left sided weakness and so presented to the ER.    On arrival, ECG showed new onset Afib rate 140s as well as subacute infarct on CT head.  She was outside the tPA window and so admitted to the hospitalist service for stroke and new AFib with Neurology and Cardiology consulting.    Acute right PCA likely embolic stroke Neurology were consulted.  MR showed acute right PCA stroke.  Echo and carotids were unremarkable.  Physical therapy evaluated patient, who had no residual deficits requiring PT, but will get outpatient OT.  Her aspirin was stopped.  She was started on Eliquis for Afib.  She was scheduled for outpatient Neurology follow up.  New onset paroxysmal atrial fibrillation CHADS2Vasc 6.  Started on Eliquis.  HR initially controlled with diltiazem drip, transitioned to oral metoprolol.  HR improved at discharge, cleared by Cardiology with outpatient Cardiology follow up.  Hypertension ACEi and HCTZ held.  Can be restarted as outpaitent.  Normocytic anemia Mild, stable from previous.  Uncclear cause.   Discharge Diagnoses:  Principal Problem:   Ischemic stroke [facility]) Active Problems:   Atrial fibrillation with RVR [facility])    Hypertension    Discharge Instructions  Discharge Instructions    Ambulatory referral to Neurology   Complete by:  As directed    Follow up with stroke clinic NP (Jessica Vanschaick or Cecille Rubin, if both not available, consider Dr. Antony Contras, Dr. Bess Harvest, or Dr. Sarina Ill) at [facility] in about 4 weeks.   Ambulatory referral to Occupational Therapy   Complete by:  As directed    Ambulatory referral to Physical Therapy   Complete by:  As directed    Diet - low sodium heart healthy   Complete by:  As directed    Discharge instructions   Complete by:  As directed    From Dr. Loleta Books: You were admitted with a new stroke and also newly discovered atrial fibrillation.  For the stroke, you seem to be doing extremely well. Follow up with occupational therapy to continue rehabbing from this event. Also, your cholesterol is not quite at goal, so increase your cholesterol medicine to 80 mg nightly Have your primary care doctor check your cholesterol and liver tests in 3 months. Goal LDL is less than 70  For the atrial fibrillation or "Afib": Research this on the internet to become more familiar with the condition Start taking apixaban/Eliquis 5 mg twice daily every day Watch for signs of bleeding, particularly blood in your bowel movements or black and sticky bowel movements.  If you were to see this, call your doctor immediately.  Also, take metoprolol 50 mg twice daily Stop taking lisinopril-HCTZ for now Call Dr. Terrence Dupont with questions and schedule a follow up appointment with him  to adjust this medicine:  [phone]   Also, call your primary care doctor to schedule a follow up appointment, check blood pressure and decide whether to restart lisinopril or HCTZ.   I have sent your new prescriptions electronically to the [facility] at [place].   Increase activity slowly   Complete by:  As directed      Allergies as of  01/28/2018   No Known Allergies     Medication List    STOP taking these medications   lisinopril-hydrochlorothiazide 20-25 MG tablet Commonly known as:  PRINZIDE,ZESTORETIC     TAKE these medications   acetaminophen 500 MG tablet Commonly known as:  TYLENOL Take 1,000 mg by mouth every 6 (six) hours as needed for mild pain or headache.   apixaban 5 MG Tabs tablet Commonly known as:  ELIQUIS Take 1 tablet (5 mg total) by mouth 2 (two) times daily.   atorvastatin 80 MG tablet Commonly known as:  LIPITOR Take 1 tablet (80 mg total) by mouth daily at 6 PM. What changed:    medication strength  how much to take  when to take this   metoprolol tartrate 50 MG tablet Commonly known as:  LOPRESSOR Take 1 tablet (50 mg total) by mouth 2 (two) times daily.   pediatric multivitamin-iron 15 MG chewable tablet Chew 2 tablets by mouth daily.      Follow-up Information    [facility] Follow up in 4 week(s).   Specialty:  Neurology Why:  stroke clinic. office will call with appt date and time Contact information: [place] Follow up.   Specialty:  Rehabilitation Why:  They will contact you for the first visit.  Contact information: [address] [place] [id number] [place] [address] [phone]         No Known Allergies  Consultations:  Neurology  Cardiology, Dr. Terrence Dupont   Procedures/Studies: Dg Chest 1 View  Result Date: 01/25/2018 CLINICAL DATA:  Headache and left-sided weakness. Atrial fibrillation. EXAM: CHEST  1 VIEW COMPARISON:  None. FINDINGS: Cardiomegaly without overt pulmonary edema. No pleural effusion or pneumothorax. No focal airspace consolidation. IMPRESSION: Cardiomegaly without overt pulmonary edema. Electronically Signed   By: Ulyses Jarred M.D.   On: 01/25/2018 19:45   Ct Head Wo  Contrast  Result Date: 01/25/2018 CLINICAL DATA:  Headache and left-sided weakness EXAM: CT HEAD WITHOUT CONTRAST TECHNIQUE: Contiguous axial images were obtained from the base of the skull through the vertex without intravenous contrast. COMPARISON:  None. FINDINGS: Brain: Asymmetric hypoattenuation within the right PCA distribution, involving the right occipital and temporal lobes. No acute hemorrhage. No midline shift or mass effect. Normal appearance of the brain parenchyma and extra axial spaces for age. Vascular: No hyperdense vessel or unexpected vascular calcification. Skull: Normal visualized skull base, calvarium and extracranial soft tissues. Sinuses/Orbits: No sinus fluid levels or advanced mucosal thickening. No mastoid effusion. Normal orbits. IMPRESSION: Asymmetric hypoattenuation within the right posterior cerebral artery territory suggest acute to subacute infarct. MRI is recommended for confirmation. No hemorrhage or mass effect. Electronically Signed   By: Ulyses Jarred M.D.   On: 01/25/2018 19:49   Mr Brain Wo Contrast (neuro Protocol)  Result Date: 01/26/2018 CLINICAL DATA:  Ataxia and LEFT-sided weakness since yesterday. Suspect stroke. In atrial fibrillation. EXAM: MRI HEAD WITHOUT CONTRAST MRA HEAD WITHOUT CONTRAST TECHNIQUE: Multiplanar, multiecho pulse sequences  of the brain and surrounding structures were obtained without intravenous contrast. Angiographic images of the head were obtained using MRA technique without contrast. COMPARISON:  CT HEAD January 25, 2018 FINDINGS: MRI HEAD FINDINGS INTRACRANIAL CONTENTS: Confluent juice diffusion mesial RIGHT temporooccipital lobe. Patchy faint reduced diffusion RIGHT thalamus. Areas of reduced diffusion demonstrate low ADC values. No susceptibility artifact to suggest hemorrhage. No parenchymal brain volume loss for age. No midline shift, significant mass effect or masses. A few scattered subcentimeter supratentorial white matter FLAIR T2  hyperintensities compatible with mild chronic small vessel ischemic disease, less than expected for age. No abnormal extra-axial fluid collections. VASCULAR: Normal major intracranial vascular flow voids present at skull base. SKULL AND UPPER CERVICAL SPINE: No abnormal sellar expansion. No suspicious calvarial bone marrow signal. Craniocervical junction maintained. SINUSES/ORBITS: The mastoid air-cells and included paranasal sinuses are well-aerated.The included ocular globes and orbital contents are non-suspicious. Status post bilateral ocular lens implants. OTHER: None. MRA HEAD FINDINGS ANTERIOR CIRCULATION: Normal flow related enhancement of the included cervical, petrous, cavernous and supraclinoid internal carotid arteries. Patent anterior communicating artery. Patent anterior and middle cerebral arteries, mild luminal irregularity compatible with atherosclerosis. No large vessel occlusion, flow limiting stenosis, aneurysm. POSTERIOR CIRCULATION: Codominant vertebral arteries. Basilar artery is patent, with normal flow related enhancement of the main branch vessels. Occluded RIGHT P2 segment without significant reconstitution. Mild luminal irregularity LEFT posterior cerebral artery compatible with atherosclerosis. Patent LEFT posterior cerebral artery. Small bilateral posterior communicating arteries present. No  flow limiting stenosis,  aneurysm. ANATOMIC VARIANTS: None. Source images and MIP images were reviewed. IMPRESSION: MRI HEAD: 1. Acute nonhemorrhagic RIGHT PCA territory infarcts (RIGHT temporooccipital lobe and RIGHT thalamus). 2. Otherwise negative noncontrast MRI head for age. MRA HEAD: 1. Acute RIGHT posterior cerebral artery occlusion. 2. Mild intracranial atherosclerosis. These results will be called to the ordering clinician or representative by the Radiologist Assistant, and communication documented in the PACS or zVision Dashboard. Electronically Signed   By: Elon Alas M.D.   On:  01/26/2018 00:02   Mr Jodene Nam Head Wo Contrast  Result Date: 01/26/2018 CLINICAL DATA:  Ataxia and LEFT-sided weakness since yesterday. Suspect stroke. In atrial fibrillation. EXAM: MRI HEAD WITHOUT CONTRAST MRA HEAD WITHOUT CONTRAST TECHNIQUE: Multiplanar, multiecho pulse sequences of the brain and surrounding structures were obtained without intravenous contrast. Angiographic images of the head were obtained using MRA technique without contrast. COMPARISON:  CT HEAD January 25, 2018 FINDINGS: MRI HEAD FINDINGS INTRACRANIAL CONTENTS: Confluent juice diffusion mesial RIGHT temporooccipital lobe. Patchy faint reduced diffusion RIGHT thalamus. Areas of reduced diffusion demonstrate low ADC values. No susceptibility artifact to suggest hemorrhage. No parenchymal brain volume loss for age. No midline shift, significant mass effect or masses. A few scattered subcentimeter supratentorial white matter FLAIR T2 hyperintensities compatible with mild chronic small vessel ischemic disease, less than expected for age. No abnormal extra-axial fluid collections. VASCULAR: Normal major intracranial vascular flow voids present at skull base. SKULL AND UPPER CERVICAL SPINE: No abnormal sellar expansion. No suspicious calvarial bone marrow signal. Craniocervical junction maintained. SINUSES/ORBITS: The mastoid air-cells and included paranasal sinuses are well-aerated.The included ocular globes and orbital contents are non-suspicious. Status post bilateral ocular lens implants. OTHER: None. MRA HEAD FINDINGS ANTERIOR CIRCULATION: Normal flow related enhancement of the included cervical, petrous, cavernous and supraclinoid internal carotid arteries. Patent anterior communicating artery. Patent anterior and middle cerebral arteries, mild luminal irregularity compatible with atherosclerosis. No large vessel occlusion, flow limiting stenosis, aneurysm. POSTERIOR CIRCULATION: Codominant vertebral arteries. Basilar artery is patent, with  normal flow related enhancement of the main branch vessels. Occluded RIGHT P2 segment without significant reconstitution. Mild luminal irregularity LEFT posterior cerebral artery compatible with atherosclerosis. Patent LEFT posterior cerebral artery. Small bilateral posterior communicating arteries present. No  flow limiting stenosis,  aneurysm. ANATOMIC VARIANTS: None. Source images and MIP images were reviewed. IMPRESSION: MRI HEAD: 1. Acute nonhemorrhagic RIGHT PCA territory infarcts (RIGHT temporooccipital lobe and RIGHT thalamus). 2. Otherwise negative noncontrast MRI head for age. MRA HEAD: 1. Acute RIGHT posterior cerebral artery occlusion. 2. Mild intracranial atherosclerosis. These results will be called to the ordering clinician or representative by the Radiologist Assistant, and communication documented in the PACS or zVision Dashboard. Electronically Signed   By: Elon Alas M.D.   On: 01/26/2018 00:02  Echo Study Conclusions  - Left ventricle: The cavity size was normal. There was moderate concentric hypertrophy. Systolic function was normal. The estimated ejection fraction was in the range of 60% to 65%. The study is not technically sufficient to allow evaluation of LV diastolic function. - Mitral valve: Calcified annulus. Mildly thickened leaflets . There was mild regurgitation. - Left atrium: Moderately dilated. - Right atrium: Moderately dilated. - Inferior vena cava: The vessel was dilated. The respirophasic diameter changes were blunted (<50%), consistent with elevated central venous pressure. Impressions: - LVEF 60-65%, moderate LVH, mild MR, moderate biatrial enlargment, dilated IVC.   Ultrasound carotid Final Interpretation: Right Carotid: Velocities in the right ICA are consistent with a 1-39% stenosis.  Left Carotid: Velocities in the left ICA are consistent with a 1-39% stenosis.    Subjective: Feels a little winded with ambulation, no  chest pain, no palpitaitons, no hemiparesis.  Feels ready to go.  Discharge Exam: Vitals:   01/28/18 1144 01/28/18 1637  BP: 107/83 121/73  Pulse: 87 (!) 110  Resp: 18   Temp:  98.6 F (37 C)  SpO2: 95% 97%   Vitals:   01/28/18 0800 01/28/18 0828 01/28/18 1144 01/28/18 1637  BP: 104/73  107/83 121/73  Pulse: (!) 53 95 87 (!) 110  Resp:   18   Temp: 98.3 F (36.8 C)   98.6 F (37 C)  TempSrc: Oral   Oral  SpO2: 99%  95% 97%  Weight:      Height:        General: Pt is alert, awake, not in acute distress Cardiovascular: Regular rate, irregularly irregular, S1/S2 +, no rubs, no gallops Respiratory: CTA bilaterally, no wheezing, no rhonchi Abdominal: Soft, NT, ND, bowel sounds + Extremities: no edema, no cyanosis    The results of significant diagnostics from this hospitalization (including imaging, microbiology, ancillary and laboratory) are listed below for reference.     Microbiology: Recent Results (from the past 240 hour(s))  MRSA PCR Screening     Status: None   Collection Time: 01/26/18  4:53 PM  Result Value Ref Range Status   MRSA by PCR NEGATIVE NEGATIVE Final    Comment:        The GeneXpert MRSA Assay (FDA approved for NASAL specimens only), is one component of a comprehensive MRSA colonization surveillance program. It is not intended to diagnose MRSA infection nor to guide or monitor treatment for MRSA infections. Performed at [facility], [place] [address] [place], [place] [address]      Labs: BNP (last 3 results) No results for input(s): BNP in the last 8760 hours. Basic Metabolic Panel: No results for input(s): NA, K, CL, CO2, GLUCOSE, BUN, CREATININE, CALCIUM, MG, PHOS in the  last 168 hours. Liver Function Tests: No results for input(s): AST, ALT, ALKPHOS, BILITOT, PROT, ALBUMIN in the last 168 hours. No results for input(s): LIPASE, AMYLASE in the last 168 hours. No results for input(s): AMMONIA in the last 168 hours. CBC: No  results for input(s): WBC, NEUTROABS, HGB, HCT, MCV, PLT in the last 168 hours. Cardiac Enzymes: No results for input(s): CKTOTAL, CKMB, CKMBINDEX, TROPONINI in the last 168 hours. BNP: Invalid input(s): POCBNP CBG: No results for input(s): GLUCAP in the last 168 hours. D-Dimer No results for input(s): DDIMER in the last 72 hours. Hgb A1c No results for input(s): HGBA1C in the last 72 hours. Lipid Profile No results for input(s): CHOL, HDL, LDLCALC, TRIG, CHOLHDL, LDLDIRECT in the last 72 hours. Thyroid function studies No results for input(s): TSH, T4TOTAL, T3FREE, THYROIDAB in the last 72 hours.  Invalid input(s): FREET3 Anemia work up No results for input(s): VITAMINB12, FOLATE, FERRITIN, TIBC, IRON, RETICCTPCT in the last 72 hours. Urinalysis    Component Value Date/Time   COLORURINE YELLOW 01/25/2018 1856   APPEARANCEUR CLEAR 01/25/2018 1856   LABSPEC 1.010 01/25/2018 1856   PHURINE 6.0 01/25/2018 1856   GLUCOSEU NEGATIVE 01/25/2018 1856   HGBUR NEGATIVE 01/25/2018 1856   BILIRUBINUR NEGATIVE 01/25/2018 1856   KETONESUR NEGATIVE 01/25/2018 1856   PROTEINUR NEGATIVE 01/25/2018 1856   NITRITE NEGATIVE 01/25/2018 1856   LEUKOCYTESUR TRACE (A) 01/25/2018 1856   Sepsis Labs Invalid input(s): PROCALCITONIN,  WBC,  LACTICIDVEN Microbiology Recent Results (from the past 240 hour(s))  MRSA PCR Screening     Status: None   Collection Time: 01/26/18  4:53 PM  Result Value Ref Range Status   MRSA by PCR NEGATIVE NEGATIVE Final    Comment:        The GeneXpert MRSA Assay (FDA approved for NASAL specimens only), is one component of a comprehensive MRSA colonization surveillance program. It is not intended to diagnose MRSA infection nor to guide or monitor treatment for MRSA infections. Performed at [facility], [place] [address], [place], [place] [address]      Time coordinating discharge: 45 minutes  SIGNED:   Edwin Dada, MD  [facility] 01/28/2018, 3:00 PM      Core measures: -VTE prophylaxis ordered at time of admission -Aspirin ordered at admission, replaced with ELiquis -Atrial fibrillation: new onset -tPA not given because of outside window -Dysphagia screen ordered in ER -Lipids ordered -PT eval ordered -Nonsmoker

## 2018-02-10 ENCOUNTER — Other Ambulatory Visit: Payer: Self-pay

## 2018-02-10 NOTE — Patient Outreach (Signed)
[facility]) Care Management  02/10/2018  Diane Pruitt 1951/02/05 [id number]   EMMI- Stroke RED ON EMMI ALERT Day # 9 Date:  02/07/18 Red Alert Reason:  Sad, hopeless anxious, or empty? Yes  Outreach attempt # 1 spoke with patient.  She is able to verify HIPAA. Patient reports she is doing ok.  Addressed red alert with her.  She states that she saw her physician on Friday and discussed her feelings with her doctor and was ordered something for depression.   Asked patient about support system.  She states she has someone in the home there to assist her. Patient voices no concerns or questions.    Plan: RN CM will close case.    [name], RN, MSN [facility] Care Management Care Management Coordinator Direct Line [phone] Toll Free: [phone]  Fax: [phone]

## 2018-02-26 ENCOUNTER — Ambulatory Visit: Payer: Medicare Other | Attending: Family Medicine | Admitting: Rehabilitative and Restorative Service Providers"

## 2018-02-26 ENCOUNTER — Encounter: Payer: Self-pay | Admitting: Rehabilitative and Restorative Service Providers"

## 2018-02-26 ENCOUNTER — Ambulatory Visit: Payer: Medicare Other | Admitting: Occupational Therapy

## 2018-02-26 DIAGNOSIS — M79671 Pain in right foot: Secondary | ICD-10-CM | POA: Insufficient documentation

## 2018-02-26 NOTE — Therapy (Signed)
[facility] [address] [place], [place], [address] Phone: [phone]   Fax:  [phone]  Patient Details  Name: Diane Pruitt MRN: [id number] Date of Birth: May 02, 1951 Referring Provider:  [name], MD  Encounter Date: 02/26/2018  Subjective Assessment - 02/26/18 1237    Subjective  Patient notes "I'm not sure why I'm having therapy."  She reports that since leaving the hospital after the stroke, she has had mobility limitations.  These limitations are more connected to R foot pain/arthritis then stroke per patient report.   She presented to the hospital 01/25/18 and d/c home on 01/28/2018.  The patient hurt her foot in the hospital when she tried to use her R leg to slide a chair up and kicked it too hard.  She notes she has had x-rays of the foot since then and no fracture.     Pertinent History  HTN, arthritis.    Currently in Pain?  Yes    Pain Score  7  with walking, no pain in sitting    Pain Location  Foot    Pain Orientation  Right    Pain Descriptors / Indicators  Aching    Pain Type  Acute pain    Pain Onset  1 to 4 weeks ago    Pain Frequency  Intermittent    Aggravating Factors   walking, on her feet and shoe aggravates pain    Pain Relieving Factors  take shoe off, sitting      [facility] PT Assessment - 02/26/18 1242      Assessment   Medical Diagnosis  ischemic stroke    Referring Provider  [name], MD (referred by hospitalist Myrene Buddy, however f/u will happen with primary care MD)    Onset Date/Surgical Date  01/25/18    Prior Therapy  in acute care      Precautions   Precautions  Fall      Restrictions   Weight Bearing Restrictions  No      Balance Screen   Has the patient fallen in the past 6 months  No    Has the patient had a decrease in activity level because of a fear of falling?   Yes recently due to foot pain    Is the patient reluctant to leave their home because of a fear  of falling?   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  PT recommended she f/u with primary care MD office for assessment prior to completing full Pt evaluation.    PT  Next Visit Plan  PT did not evaluate due to red, swollen, hot MTP joint + pain.  PT recommended she f/u with MD    Consulted and Agree with Plan of Care  Patient         Rudell Cobb, [place] 02/26/2018, 1:06 PM  [facility] [address] [place], [place], [address] Phone: [phone]   Fax:  [phone]

## 2018-02-26 NOTE — Therapy (Unsigned)
[facility] [address] [place], [place], [address] Phone: [phone]   Fax:  [phone]  Patient Details  Name: Diane Pruitt MRN: [id number] Date of Birth: Jul 22, 1951 Referring Provider:  Edwin Dada,*  Encounter Date: 02/26/2018   Theone Murdoch 02/26/2018, 1:27 PM  [facility] [address] [place], [place], [address] Phone: [phone]   Fax:  [phone]

## 2018-03-03 ENCOUNTER — Ambulatory Visit: Payer: Medicare Other | Admitting: Adult Health

## 2018-03-03 ENCOUNTER — Encounter: Payer: Self-pay | Admitting: Adult Health

## 2018-03-03 VITALS — BP 121/65 | HR 80 | Ht 62.0 in | Wt 181.0 lb

## 2018-03-03 DIAGNOSIS — I63411 Cerebral infarction due to embolism of right middle cerebral artery: Secondary | ICD-10-CM | POA: Diagnosis not present

## 2018-03-03 DIAGNOSIS — E785 Hyperlipidemia, unspecified: Secondary | ICD-10-CM

## 2018-03-03 DIAGNOSIS — I10 Essential (primary) hypertension: Secondary | ICD-10-CM | POA: Diagnosis not present

## 2018-03-03 DIAGNOSIS — I480 Paroxysmal atrial fibrillation: Secondary | ICD-10-CM

## 2018-03-03 NOTE — Progress Notes (Signed)
[facility] [address] [place]. [place] [address] [phone]       OFFICE FOLLOW UP NOTE  Ms. Diane Pruitt Date of Birth:  14-Mar-1951 Medical Record Number:  [id number]   Reason for Referral:  hospital stroke follow up  CHIEF COMPLAINT:  Chief Complaint  Patient presents with  . Follow-up    follow up for hopsital in April 2019 for stroke     HPI: Diane Pruitt is being seen today for initial visit in the office for right PCA infart on 3/3/0/19. History obtained from patient and chart review. Reviewed all radiology images and labs personally.  Diane Pruitt is a 67 year old female with PMH of HTN who presented with left-sided weakness.  CT head reviewed and showed hypoattenuation of right PCA territory without hemorrhage.  MRI brain reviewed and showed right PCA territory infarcts.  MRA head showed right PCA occlusion with mild intracranial atherosclerosis.  Carotid Doppler showed bilateral ICA stenosis of 1 to 39%.  2D echo showed a EF of 60 to 65%, moderate LVH, mild MR, moderate biatrial enlargement and dilated IVC.  LDL 97 and A1c 5.0.  On 01/27/2018, EKG confirmed atrial fibrillation.  Patient was started on Eliquis for atrial fibrillation management.  PTA, patient was on Lipitor 40 mg and this was increased to 80 mg.  Therapy recommended outpatient OT.  Patient discharged in stable condition.  Since discharge, patient has been doing well with all left-sided weakness resolved and patient is back to her normal activities.  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  Patient did see ophthalmologist for peripheral vision test and he did clear her for driving.  Patient will have a follow-up appointment in a couple months with ophthalmologist.  She continues take Eliquis without side effects of bleeding or bruising.  Continues to take Lipitor without side effects or myalgias.  Blood pressure today 121/65.  She was  recently started on Cardizem and Pacerone by her cardiologist for atrial fibrillation management and complains of increased fatigue since starting these.  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  Denies new or worsening stroke/TIA symptoms.  ROS:   14 system review of systems performed and negative with exception of fatigue and diarrhea  PMH:  Past Medical History:  Diagnosis Date  . Hypertension   . Stroke [facility])     PSH:  Past Surgical History:  Procedure Laterality Date  . CESAREAN SECTION      Social History:  Social History   Socioeconomic History  . Marital status: Divorced    Spouse name: Not on file  . Number of children: Not on file  . Years of education: Not on file  . Highest education level: Not on file  Occupational History  . Not on file  Social Needs  . Financial resource strain: Not on file  . Food insecurity:    Worry: Not on file    Inability: Not on file  . Transportation needs:    Medical: Not on file    Non-medical: Not on file  Tobacco Use  . Smoking status: Never Smoker  . Smokeless tobacco: Never Used  Substance and Sexual Activity  . Alcohol use: Never    Frequency: Never  . Drug use: Never  . Sexual activity: Not on file  Lifestyle  . Physical activity:    Days per week: Not on file    Minutes per session: Not on file  .  Stress: Not on file  Relationships  . Social connections:    Talks on phone: Not on file    Gets together: Not on file    Attends religious service: Not on file    Active member of club or organization: Not on file    Attends meetings of clubs or organizations: Not on file    Relationship status: Not on file  . Intimate partner violence:    Fear of current or ex partner: Not on file    Emotionally abused: Not on file    Physically abused: Not on file    Forced sexual activity: Not on file  Other Topics Concern  . Not on file  Social History Narrative  . Not on  file    Family History: History reviewed. No pertinent family history.  Medications:   Current Outpatient Medications on File Prior to Visit  Medication Sig Dispense Refill  . acetaminophen (TYLENOL) 500 MG tablet Take 1,000 mg by mouth every 6 (six) hours as needed for mild pain or headache.    Marland Kitchen amiodarone (PACERONE) 200 MG tablet TK 1 T PO BID FOR 2 WEEKS THEN 1 T QD  3  . apixaban (ELIQUIS) 5 MG TABS tablet Take 1 tablet (5 mg total) by mouth 2 (two) times daily. 60 tablet 6  . atorvastatin (LIPITOR) 80 MG tablet Take 1 tablet (80 mg total) by mouth daily at 6 PM. 30 tablet 6  . citalopram (CELEXA) 20 MG tablet TK 1 T PO QD  0  . diltiazem (CARDIZEM CD) 180 MG 24 hr capsule TK ONE C PO  QAM  3  . pediatric multivitamin-iron (POLY-VI-SOL WITH IRON) 15 MG chewable tablet Chew 2 tablets by mouth daily.     No current facility-administered medications on file prior to visit.     Allergies:  No Known Allergies   Physical Exam  Vitals:   03/03/18 1053  BP: 121/65  Pulse: 80  Weight: 181 lb (82.1 kg)  Height: 5\' 2"  (1.575 m)   Body mass index is 33.11 kg/m. No exam data present  General: well developed, pleasant middle-aged Caucasian female, well nourished, seated, in no evident distress Head: head normocephalic and atraumatic.   Neck: supple with no carotid or supraclavicular bruits Cardiovascular: irregular rate and rhythm, no murmurs Musculoskeletal: no deformity Skin:  no rash/petichiae Vascular:  Normal pulses all extremities  Neurologic Exam Mental Status: Awake and fully alert. Oriented to place and time. Recent and remote memory intact. Attention span, concentration and fund of knowledge appropriate. Mood and affect appropriate.  Cranial Nerves: Fundoscopic exam reveals sharp disc margins. Pupils equal, briskly reactive to light. Extraocular movements full without nystagmus.  Left superior homonymous hemianopia.  Hearing intact. Facial sensation intact. Face, tongue,  palate moves normally and symmetrically.  Motor: Normal bulk and tone. Normal strength in all tested extremity muscles. Sensory.: intact to touch , pinprick , position and vibratory sensation.  Coordination: Rapid alternating movements normal in all extremities. Finger-to-nose and heel-to-shin performed accurately bilaterally. Gait and Station: Arises from chair without difficulty. Stance is normal. Gait demonstrates normal stride length and balance . Able to heel, toe and tandem walk without difficulty.  Reflexes: 1+ and symmetric. Toes downgoing.    NIHSS  1 Modified Rankin  1 HAS-BLED 2 CHA2DS2-VASc 6   Diagnostic Data (Labs, Imaging, Testing)  CT HEAD WO CONTRAST 01/25/18 IMPRESSION: Asymmetric hypoattenuation within the right posterior cerebral artery territory suggest acute to subacute infarct. MRI is recommended for confirmation.  No hemorrhage or mass effect.  MR BRAIN WO CONTRAST MR MRA HEAD WO CONTRAST IMPRESSION: MRI HEAD: 1. Acute nonhemorrhagic RIGHT PCA territory infarcts (RIGHT temporooccipital lobe and RIGHT thalamus). 2. Otherwise negative noncontrast MRI head for age. MRA HEAD: 1. Acute RIGHT posterior cerebral artery occlusion. 2. Mild intracranial atherosclerosis.  ECHOCARDIOGRAM COMPLETE 01/26/18 Impressions: - LVEF 60-65%, moderate LVH, mild MR, moderate biatrial enlargment,   dilated IVC.  US CAROTID DUPLEX BILATERAL 01/26/18 Final Interpretation: Right Carotid: Velocities in the right ICA are consistent with a 1-39% stenosis. Left Carotid: Velocities in the left ICA are consistent with a 1-39% stenosis. Vertebrals: Bilateral vertebral arteries demonstrate antegrade flow. Subclavians: Normal flow hemodynamics were seen in bilateral subclavian       arteries.    ASSESSMENT: Diane Pruitt is a 67 y.o. year old female here with right PCA infarcts on 01/25/18 secondary to atrial fibrillation. Vascular risk factors include HTN and HLD.      PLAN: -Continue Eliquis (apixaban) daily  and lipitor  for secondary stroke prevention -F/u with PCP regarding your HTN and HLD management -PCP to prescribe -F/u cardiology regarding atrial fibrillation and Eliquis management  -continue to monitor BP at home -Maintain strict control of hypertension with blood pressure goal below 130/90, diabetes with hemoglobin A1c goal below 6.5% and cholesterol with LDL cholesterol (bad cholesterol) goal below 70 mg/dL. I also advised the patient to eat a healthy diet with plenty of whole grains, cereals, fruits and vegetables, exercise regularly and maintain ideal body weight.  Follow up in 4 months or call earlier if needed   Greater than 50% time during this 25 minute consultation visit was spent on counseling and coordination of care about HLD, HTN and A. fib, discussion about risk benefit of anticoagulation and answering questions.     Venancio Poisson, AGNP-BC  [facility] [address] [place], [place] [address]  Phone [phone] Fax [phone]

## 2018-03-03 NOTE — Patient Instructions (Signed)
Continue Eliquis (apixaban) daily  and lipitor  for secondary stroke prevention  Continue to follow up with PCP regarding cholesterol and hypertension  Continue to follow up with cardiologist regarding atrial fibrillation and eliquis management  Follow up with opthalmologic regarding peripheral vision loss  Maintain strict control of hypertension with blood pressure goal below 130/90, diabetes with hemoglobin A1c goal below 6.5% and cholesterol with LDL cholesterol (bad cholesterol) goal below 70 mg/dL. I also advised the patient to eat a healthy diet with plenty of whole grains, cereals, fruits and vegetables, exercise regularly and maintain ideal body weight.  Followup in the future with me in 4 months or call earlier if needed

## 2018-03-03 NOTE — Progress Notes (Signed)
I agree with the above plan 

## 2018-03-21 ENCOUNTER — Other Ambulatory Visit: Payer: Self-pay | Admitting: Family Medicine

## 2018-03-21 ENCOUNTER — Other Ambulatory Visit (HOSPITAL_COMMUNITY)
Admission: RE | Admit: 2018-03-21 | Discharge: 2018-03-21 | Disposition: A | Discharge status: Home / Self Care | Payer: Medicare Other | Source: Ambulatory Visit | Attending: Family Medicine | Admitting: Family Medicine

## 2018-03-21 DIAGNOSIS — Z124 Encounter for screening for malignant neoplasm of cervix: Secondary | ICD-10-CM | POA: Insufficient documentation

## 2018-03-26 LAB — CYTOLOGY - PAP
Diagnosis: NEGATIVE
HPV: NOT DETECTED

## 2018-07-07 ENCOUNTER — Ambulatory Visit: Payer: Medicare Other | Admitting: Adult Health

## 2018-10-13 ENCOUNTER — Other Ambulatory Visit: Payer: Self-pay | Admitting: Cardiology

## 2018-10-13 DIAGNOSIS — R079 Chest pain, unspecified: Secondary | ICD-10-CM

## 2018-10-27 ENCOUNTER — Encounter (HOSPITAL_COMMUNITY)
Admission: RE | Admit: 2018-10-27 | Discharge: 2018-10-27 | Disposition: A | Discharge status: Home / Self Care | Payer: Medicare Other | Source: Ambulatory Visit | Attending: Cardiology | Admitting: Cardiology

## 2018-10-27 DIAGNOSIS — R079 Chest pain, unspecified: Secondary | ICD-10-CM

## 2018-10-27 MED ORDER — TECHNETIUM TC 99M TETROFOSMIN IV KIT
30.0000 | PACK | Freq: Once | INTRAVENOUS | Status: AC | PRN
  Administered 2018-10-27: 30 via INTRAVENOUS

## 2018-10-27 MED ORDER — TECHNETIUM TC 99M TETROFOSMIN IV KIT
10.0000 | PACK | Freq: Once | INTRAVENOUS | Status: AC | PRN
  Administered 2018-10-27: 10 via INTRAVENOUS

## 2018-10-27 MED ORDER — REGADENOSON 0.4 MG/5ML IV SOLN
INTRAVENOUS | Status: AC
  Filled 2018-10-27: qty 5

## 2018-10-27 MED ORDER — REGADENOSON 0.4 MG/5ML IV SOLN
0.4000 mg | Freq: Once | INTRAVENOUS | Status: AC
  Administered 2018-10-27: 0.4 mg via INTRAVENOUS

## 2019-07-31 ENCOUNTER — Ambulatory Visit: Payer: Medicare Other | Attending: Family Medicine | Admitting: Physical Therapy

## 2019-07-31 ENCOUNTER — Encounter: Payer: Self-pay | Admitting: Physical Therapy

## 2019-07-31 ENCOUNTER — Other Ambulatory Visit: Payer: Self-pay

## 2019-07-31 DIAGNOSIS — M6281 Muscle weakness (generalized): Secondary | ICD-10-CM

## 2019-07-31 DIAGNOSIS — R262 Difficulty in walking, not elsewhere classified: Secondary | ICD-10-CM | POA: Diagnosis present

## 2019-07-31 DIAGNOSIS — R2681 Unsteadiness on feet: Secondary | ICD-10-CM | POA: Diagnosis not present

## 2019-07-31 NOTE — Therapy (Signed)
[facility] [address] [place], [place], [address] Phone: [phone]   Fax:  [phone]  Physical Therapy Evaluation  Patient Details  Name: Diane Pruitt MRN: [id number] Date of Birth: 1950/11/04 Referring Provider (PT): [name], MD   Encounter Date: 07/31/2019  PT End of Session - 07/31/19 1203    Visit Number  1    Number of Visits  13    Date for PT Re-Evaluation  10/29/19   written for 60 day POC   Authorization Type  [employer]HC - Medicare    PT Start Time  1102    PT Stop Time  1144    PT Time Calculation (min)  42 min    Equipment Utilized During Treatment  Gait belt    Activity Tolerance  Patient tolerated treatment well    Behavior During Therapy  [facility] for tasks assessed/performed       Past Medical History:  Diagnosis Date  . Hypertension   . Stroke [facility])     Past Surgical History:  Procedure Laterality Date  . CESAREAN SECTION      There were no vitals filed for this visit.  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 Came to OPPT at [facility] neuro rehab for an eval, but was unable to be seen due to a flareup of gout. Thought that the weakness would get better, but it hasn't. Has weakness in the L leg and L arm (leg weaker than arm). L leg tires easily and climbing about 15 steps or more is difficult. Has had a couple of falls - got up too fast and felt dizzy. Hasn't occurred in 6-7 months.    Pertinent History  A fib, HTN, chronic gout , depression   Limitations  Walking    How long can you walk comfortably?  about a 1/2 mile.    Patient Stated Goals  to be able to climb steps without having a major trauma.    Currently in Pain?  No/denies         [facility] PT Assessment - 07/31/19 1113      Assessment   Medical Diagnosis  Hx of R CVA (01/2018)    Referring Provider (PT)  [name], MD    Onset Date/Surgical Date  01/28/18    Hand Dominance  Left    Prior  Therapy  N/A      Precautions   Precautions  Fall      Balance Screen   Has the patient fallen in the past 6 months  No    Has the patient had a decrease in activity level because of a fear of falling?   No    Is the patient reluctant to leave their home because of a fear of falling?   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 would be 4-5 hours, also sometimes has to get down on hands and knees - pt states this is getting increasingly difficult    Leisure  used to like to walk in the park, likes bird watching, likes to read      Cognition   Overall Cognitive Status  Within Functional Limits for tasks assessed      Sensation   Light Touch  Appears Intact    Hot/Cold  Appears Intact      Coordination   Gross Motor Movements are Fluid and Coordinated  Yes      ROM / Strength   AROM / PROM / Strength  Strength      Strength   Strength Assessment Site  Hip;Knee;Ankle    Right/Left Hip  Right;Left    Right Hip Flexion  4+/5    Left Hip Flexion  4/5    Right/Left Knee  Left;Right    Right Knee Flexion  5/5    Right Knee Extension  5/5    Left Knee Flexion  4+/5    Left Knee Extension  5/5    Right/Left Ankle  Right;Left    Right Ankle Dorsiflexion  5/5    Left Ankle Dorsiflexion  5/5      Transfers   Five time sit to stand comments   15.41 seconds intermittent UE support from low blue mat table    Comments  30  second chair stand: 12 sit <> stands using BUE support   age related norm: 15 sit <> stands for women 65-69     Ambulation/Gait   Ambulation/Gait  Yes    Ambulation/Gait Assistance  5: Supervision    Ambulation Distance (Feet)  115 Feet    Assistive device  None    Gait Pattern  Step-through pattern;Decreased arm swing - left;Decreased trunk rotation;Wide base of support   B genu valgum   Ambulation Surface  Level;Indoor    Gait velocity  11.91 seconds = 2.75 ft/sec    Stairs  Yes    Stairs Assistance  5: Supervision    Stairs Assistance Details (indicate cue type and reason)  performed 1st 4 steps with BUE assist on handrails, 2nd rep only single UE on right railing. Pt with increased fear and took increased time descending stairs    Stair Management Technique  One rail Right;Two rails;Alternating pattern;Forwards    Number of Stairs  8    Height of Stairs  4      High Level Balance   High Level Balance Comments  mCTSIB: condition 3: 30 seconds, condition 4: 10 seconds (indicates decreased vestibular system for balance)      Functional Gait  Assessment   Gait assessed   Yes    Gait Level Surface  Walks 20 ft, slow speed, abnormal gait pattern, evidence for imbalance or deviates 10-15 in outside of the 12 in walkway width. Requires more than 7 sec to ambulate 20 ft.   8.37   Change in Gait Speed  Able to change speed, demonstrates mild gait deviations, deviates 6-10 in outside of the 12 in walkway width, or no gait deviations, unable to achieve a major change in velocity, or uses a change in velocity, or uses an assistive device.    Gait with Horizontal Head Turns  Performs head turns with moderate changes in gait  velocity, slows down, deviates 10-15 in outside 12 in walkway width but recovers, can continue to walk.    Gait with Vertical Head Turns  Performs task with moderate change in gait velocity, slows down, deviates 10-15 in outside 12 in walkway width but recovers, can continue to  walk.    Gait and Pivot Turn  Pivot turns safely within 3 sec and stops quickly with no loss of balance.    Step Over Obstacle  Is able to step over one shoe box (4.5 in total height) without changing gait speed. No evidence of imbalance.    Gait with Narrow Base of Support  Ambulates less than 4 steps heel to toe or cannot perform without assistance.    Gait with Eyes Closed  Walks 20 ft, slow speed, abnormal gait pattern, evidence for imbalance, deviates 10-15 in outside 12 in walkway width. Requires more than 9 sec to ambulate 20 ft.   18.76 seconds   Ambulating Backwards  Walks 20 ft, slow speed, abnormal gait pattern, evidence for imbalance, deviates 10-15 in outside 12 in walkway width.   18.75 seconds   Steps  Alternating feet, must use rail.    Total Score  14    FGA comment:  14/30                           PT Education - 07/31/19 1203    Education Details  clinical findings, POC    Person(s) Educated  Patient    Methods  Explanation    Comprehension  Verbalized understanding       PT Short Term Goals - 08/01/19 1313      PT SHORT TERM GOAL #1   Title  Patient will be independent with initial HEP in order to build upon functional gains in therapy. ALL STGS DUE 08/31/19    Time  4    Period  Weeks    Status  New    Target Date  08/31/19      PT SHORT TERM GOAL #2   Title  Patient will improve FGA score to at least 18/30 in order to demonstrate decreased fall risk.    Baseline  14/30    Time  4    Period  Weeks    Status  New      PT SHORT TERM GOAL #3   Title  Patient will perform condition 4 of modified CTSIB for at least 20 seconds in order to demonstrate improved vestibular input for balance.    Baseline  10 seconds with supervision    Time  4    Period  Weeks    Status  New      PT SHORT TERM GOAL #4   Title  Patient will improve gait speed to at least 3.2 ft/sec in order to demonstrate improved community mobility.    Baseline  2.75 ft/sec     Time  4    Period  Weeks    Status  New      PT SHORT TERM GOAL #5   Title  Patient will perform at least 8 steps with supervision and single hand rail with step to vs step through pattern in order to safely perform stairs at patient's daughter and son's house.    Time  4    Period  Weeks    Status  New        PT Long Term Goals - 08/01/19 1316  PT LONG TERM GOAL #1   Title  Patient will be independent with final HEP in order to build upon functional gains in therapy. ALL LTGS DUE 09/30/19    Time  8    Period  Weeks    Status  New    Target Date  09/30/19      PT LONG TERM GOAL #2   Title  Patient will improve FGA score to at least 23/30 in order to demonstrate decreased fall risk.    Baseline  14/30 on 08/01/19    Time  8    Period  Weeks    Status  New      PT LONG TERM GOAL #3   Title  Patient will perform floor <> tall kneeling/quadraped transfer with mod I in order to feel more confident performing duties at work.    Time  8    Period  Weeks    Status  New      PT LONG TERM GOAL #4   Title  Patient will perform 15 steps with mod I and step through vs. step to pattern with single handrail and reports of improved confidence in order to safely perform stairs at her son and daughters house.    Time  8    Period  Weeks    Status  New      PT LONG TERM GOAL #5   Title  Patient will perform at least 15 sit <> stands during 30 second chair stand in order to demonstrate improved LE strength and perform within age related norms.    Baseline  12 sit <> stands with BUE support from low blue mat table.    Time  8    Period  Weeks    Status  New            Plan - 07/31/19 1612    Clinical Impression Statement  Patient is a 68 year old female referred to Neuro OPPT for evaluation with primary concern of L LE weakness with hx of CVA 01/2018.  Pt's PMH is significant for: A fib, HTN, chronic gout , depression. The following deficits were present during the exam:  impaired balance with dynamic gait, decreased vestibular input for balance on compliant surfaces. Pt's FGA scores indicate pt is at a high risk for falls. Pt would benefit from skilled PT to address these impairments and functional limitations to maximize functional mobility independence and decrease fall risk.    Personal Factors and Comorbidities  Comorbidity 3+;Time since onset of injury/illness/exacerbation;Past/Current Experience    Comorbidities  hx of CVA (01/2018) A fib, HTN, chronic gout , depression    Examination-Activity Limitations  Stairs;Locomotion Level;Stand    Examination-Participation Restrictions  --   Work   [employer]  Stable/Uncomplicated    Clinical Decision Making  Low    Rehab Potential  Good    PT Frequency  2x / week   followed by 1x week for 4 weeks   PT Duration  4 weeks   followed by 1x week for 4 weeks   PT Treatment/Interventions  ADLs/Self Care Home Management;Gait training;Stair training;Functional mobility training;Therapeutic activities;Neuromuscular re-education;Balance training;Therapeutic exercise;Patient/family education    PT Next Visit Plan  initial HEP - LE strength, corner balance (esp for use of vestibular system), standing dynamic balance at countertop. Stair training, practice getting up/down off of the floor (pt states that she has to do this for work and it has gotten increasingly difficult)    Consulted  and Agree with Plan of Care  Patient       Patient will benefit from skilled therapeutic intervention in order to improve the following deficits and impairments:  Abnormal gait, Decreased strength, Difficulty walking, Decreased balance, Decreased activity tolerance  Visit Diagnosis: Unsteadiness on feet  Difficulty in walking, not elsewhere classified  Muscle weakness (generalized)     Problem List Patient Active Problem List   Diagnosis Date Noted  . Atrial fibrillation with RVR ([place]) 01/25/2018  . Ischemic  stroke ([place]) 01/25/2018  . Hypertension 01/25/2018    Arliss Journey, PT, DPT 08/01/2019, 1:21 PM  [facility] [address] [place], [place], [address] Phone: [phone]   Fax:  [phone]  Name: Diane Pruitt MRN: [id number] Date of Birth: 1951/08/18

## 2019-08-05 ENCOUNTER — Encounter: Payer: Self-pay | Admitting: Physical Therapy

## 2019-08-05 ENCOUNTER — Other Ambulatory Visit: Payer: Self-pay

## 2019-08-05 ENCOUNTER — Ambulatory Visit: Payer: Medicare Other | Admitting: Physical Therapy

## 2019-08-05 DIAGNOSIS — R262 Difficulty in walking, not elsewhere classified: Secondary | ICD-10-CM

## 2019-08-05 DIAGNOSIS — M6281 Muscle weakness (generalized): Secondary | ICD-10-CM

## 2019-08-05 DIAGNOSIS — R2681 Unsteadiness on feet: Secondary | ICD-10-CM

## 2019-08-05 NOTE — Patient Instructions (Signed)
Access Code: [email or web address]  URL: [email or web address]  Date: 08/05/2019  Prepared by: Janann August   Exercises Toe Walking - 3 sets - 2x daily - 7x weekly Heel Walking - 3 sets - 2x daily - 7x weekly Tandem Walking with Counter Support - 3 sets - 3x daily - 7x weekly Tandem Stance - 3 sets - 20 hold - 2x daily - 7x weekly Sit to Stand - 10 reps - 2 sets - 2x daily - 7x weekly Supine Bridge - 5 reps - 2 sets - 3 hold - 2x daily - 7x weekly Romberg Stance on Foam Pad - 10 reps - 2 sets - 2x daily - 7x weekly Wide Stance with Eyes Closed on Foam Pad - 3 sets - 20 hold - 3x daily - 7x weekly

## 2019-08-05 NOTE — Therapy (Signed)
[facility] [address] [place], [place], [address] Phone: [phone]   Fax:  [phone]  Physical Therapy Treatment  Patient Details  Name: Diane Pruitt MRN: [id number] Date of Birth: 07/14/1951 Referring Provider (PT): [name], MD   Encounter Date: 08/05/2019  PT End of Session - 08/06/19 0814    Visit Number  2    Number of Visits  13    Date for PT Re-Evaluation  10/29/19   written for 60 day POC   Authorization Type  [employer]HC - Medicare    PT Start Time  1446    PT Stop Time  1531    PT Time Calculation (min)  45 min    Equipment Utilized During Treatment  Gait belt    Activity Tolerance  Patient tolerated treatment well    Behavior During Therapy  [facility] for tasks assessed/performed       Past Medical History:  Diagnosis Date  . Hypertension   . Stroke [facility])     Past Surgical History:  Procedure Laterality Date  . CESAREAN SECTION      There were no vitals filed for this visit.  Subjective Assessment - 08/05/19 1452    Subjective  Feeling a little bit sore today - had to get down on the floor on her hands and knees for work yesterday. Has to do this about every week. Has 1/10 pain in her right foot when she is walking - feels like something is stuck in her foot.    Pertinent History  A fib, HTN, chronic gout , major depressive disorder    Limitations  Walking    How long can you walk comfortably?  about a 1/2 mile.    Patient Stated Goals  to be able to climb steps without having a major trauma.    Currently in Pain?  No/denies                       [facility] Adult PT Treatment/Exercise - 08/06/19 0816      Transfers   Comments  1 x 10 reps of mini squats with BUE support at counter - pt reporting increased L knee pain after performing, not added to HEP       Self-Care   Self-Care  Other Self-Care Comments    Other Self-Care Comments   At beginning of session pt reported she  has had increased R foot pain and feels like something is stuck in her foot - therapist assesed and noticed a small bump/raised surface - advised pt to make an appointment with her PCP to get it assessed, pt verbalized understanding           Access Code: [email or web address]  URL: [email or web address]  Date: 08/05/2019  Prepared by: Janann August   Initiated pt's HEP for LE strength and balance:   Exercises Toe Walking - 3 sets - 2x daily - 7x weekly - forwards and backwards  Heel Walking - 3 sets - 2x daily - 7x weekly Tandem Walking with Counter Support - 3 sets - 3x daily - 7x weekly - forwards and backwards  Tandem Stance - 3 sets - 20 hold - 2x daily - 7x weekly Sit to Stand - 10 reps - 2 sets - 2x daily - 7x weekly - with LLE staggered behind R Supine Bridge - 5 reps - 2 sets - 3 hold - 2x daily - 7x weekly Romberg Stance on Foam Pad - 10 reps -  2 sets - 2x daily - 7x weekly Wide Stance with Eyes Closed on Foam Pad - 3 sets - 20 hold - 3x daily - 7x weekly - with head turns/nods     PT Education - 08/06/19 0814    Education Details  initial HEP for LE strengthening and balance    Person(s) Educated  Patient    Methods  Explanation;Demonstration;Handout    Comprehension  Verbalized understanding;Returned demonstration       PT Short Term Goals - 08/01/19 1313      PT SHORT TERM GOAL #1   Title  Patient will be independent with initial HEP in order to build upon functional gains in therapy. ALL STGS DUE 08/31/19    Time  4    Period  Weeks    Status  New    Target Date  08/31/19      PT SHORT TERM GOAL #2   Title  Patient will improve FGA score to at least 18/30 in order to demonstrate decreased fall risk.    Baseline  14/30    Time  4    Period  Weeks    Status  New      PT SHORT TERM GOAL #3   Title  Patient will perform condition 4 of modified CTSIB for at least 20 seconds in order to demonstrate improved vestibular input for balance.    Baseline  10  seconds with supervision    Time  4    Period  Weeks    Status  New      PT SHORT TERM GOAL #4   Title  Patient will improve gait speed to at least 3.2 ft/sec in order to demonstrate improved community mobility.    Baseline  2.75 ft/sec    Time  4    Period  Weeks    Status  New      PT SHORT TERM GOAL #5   Title  Patient will perform at least 8 steps with supervision and single hand rail with step to vs step through pattern in order to safely perform stairs at patient's daughter and son's house.    Time  4    Period  Weeks    Status  New        PT Long Term Goals - 08/01/19 1316      PT LONG TERM GOAL #1   Title  Patient will be independent with final HEP in order to build upon functional gains in therapy. ALL LTGS DUE 09/30/19    Time  8    Period  Weeks    Status  New    Target Date  09/30/19      PT LONG TERM GOAL #2   Title  Patient will improve FGA score to at least 23/30 in order to demonstrate decreased fall risk.    Baseline  14/30 on 08/01/19    Time  8    Period  Weeks    Status  New      PT LONG TERM GOAL #3   Title  Patient will perform floor <> tall kneeling/quadraped transfer with mod I in order to feel more confident performing duties at work.    Time  8    Period  Weeks    Status  New      PT LONG TERM GOAL #4   Title  Patient will perform 15 steps with mod I and step through vs. step to pattern with single handrail and  reports of improved confidence in order to safely perform stairs at her son and daughters house.    Time  8    Period  Weeks    Status  New      PT LONG TERM GOAL #5   Title  Patient will perform at least 15 sit <> stands during 30 second chair stand in order to demonstrate improved LE strength and perform within age related norms.    Baseline  12 sit <> stands with BUE support from low blue mat table.    Time  8    Period  Weeks    Status  New            Plan - 08/06/19 0818    Clinical Impression Statement  Focus of  today's session was initating pt's HEP for LE strengthening and dynamic/static balance. Pt with increased difficulty performing heel walking at countertop with forwards walking. Corner balance exercises adminstered on pillow with eyes closed and wider BOS to target vestibular system for balance - pt initially fearful of eyes closed balance, but was able to improve with multiple repetitions throughout today's session. Will continue to progress towards LTGs.    Personal Factors and Comorbidities  Comorbidity 3+;Time since onset of injury/illness/exacerbation;Past/Current Experience    Comorbidities  hx of CVA (01/2018) A fib, HTN, chronic gout , depression    Examination-Activity Limitations  Stairs;Locomotion Level;Stand    Examination-Participation Restrictions  --   Work   Stability/Clinical Decision Making  Stable/Uncomplicated    Rehab Potential  Good    PT Frequency  2x / week   followed by 1x week for 4 weeks   PT Duration  4 weeks   followed by 1x week for 4 weeks   PT Treatment/Interventions  ADLs/Self Care Home Management;Gait training;Stair training;Functional mobility training;Therapeutic activities;Neuromuscular re-education;Balance training;Therapeutic exercise;Patient/family education    PT Next Visit Plan  review HEP if pt has any questions? corner balance (esp for use of vestibular system), standing dynamic balance at countertop. Stair training, proximal hip strengthening - either in tall kneeling/quadraped. practice getting up/down off of the floor (pt states that she has to do this for work and it has gotten increasingly difficult)    PT Home Exercise Plan  [email or web address]    Consulted and Agree with Plan of Care  Patient       Patient will benefit from skilled therapeutic intervention in order to improve the following deficits and impairments:  Abnormal gait, Decreased strength, Difficulty walking, Decreased balance, Decreased activity tolerance  Visit Diagnosis: Unsteadiness on  feet  Difficulty in walking, not elsewhere classified  Muscle weakness (generalized)     Problem List Patient Active Problem List   Diagnosis Date Noted  . Atrial fibrillation with RVR ([place]) 01/25/2018  . Ischemic stroke ([place]) 01/25/2018  . Hypertension 01/25/2018    Lillia Pauls, DPT  08/06/2019, 8:21 AM  [facility] [address] [place], [place], [address] Phone: [phone]   Fax:  [phone]  Name: Diane Pruitt MRN: [id number] Date of Birth: 1950/12/28

## 2019-08-07 ENCOUNTER — Ambulatory Visit: Payer: Medicare Other

## 2019-08-07 ENCOUNTER — Other Ambulatory Visit: Payer: Self-pay

## 2019-08-07 DIAGNOSIS — R2681 Unsteadiness on feet: Secondary | ICD-10-CM

## 2019-08-07 DIAGNOSIS — R262 Difficulty in walking, not elsewhere classified: Secondary | ICD-10-CM

## 2019-08-07 DIAGNOSIS — M6281 Muscle weakness (generalized): Secondary | ICD-10-CM

## 2019-08-07 NOTE — Therapy (Signed)
[facility] [address] [place], [place], [address] Phone: [phone]   Fax:  [phone]  Physical Therapy Treatment  Patient Details  Name: Diane Pruitt MRN: [id number] Date of Birth: 12/28/1950 Referring Provider (PT): [name], MD   Encounter Date: 08/07/2019  PT End of Session - 08/07/19 1037    Visit Number  3    Number of Visits  13    Date for PT Re-Evaluation  10/29/19   written for 60 day POC   Authorization Type  [employer] - Medicare    PT Start Time  1015    PT Stop Time  1055    PT Time Calculation (min)  40 min    Equipment Utilized During Treatment  Gait belt    Activity Tolerance  Patient tolerated treatment well    Behavior During Therapy  [facility] for tasks assessed/performed       Past Medical History:  Diagnosis Date  . Hypertension   . Stroke [facility])     Past Surgical History:  Procedure Laterality Date  . CESAREAN SECTION      There were no vitals filed for this visit.  Subjective Assessment - 08/07/19 1017    Subjective  Pt reports that she is still having some soreness mostly in bottom/hips.    Pertinent History  A fib, HTN, chronic gout , major depressive disorder    Limitations  Walking    How long can you walk comfortably?  about a 1/2 mile.    Patient Stated Goals  to be able to climb steps without having a major trauma.    Currently in Pain?  Yes   just soreness from doing more   Pain Score  0-No pain    Pain Location  Buttocks    Pain Descriptors / Indicators  Sore    Pain Type  Acute pain                       [facility] Adult PT Treatment/Exercise - 08/07/19 0001      Transfers   Transfers  Floor to Transfer    Floor to Transfer  6: Modified independent (Device/Increase time)    Comments  Pt performed floor to stand transfer from mat on floor holding to varied surfaces. Pt prefers to lead with left LE up. Did attempt on right and was more difficult. Pt able  to perform holding to mat table mod I. Performed holding to 16" platform mod I and then performed holding to 13" step mod I. Pt reports she has small step stool that is lower than both and PT advised to try to find something somewhere closer to the 13" to 16" height to help as trying to count on a shelf to for support may not work.      Neuro Re-ed    Neuro Re-ed Details   Standing in corner on pillow: feet apart eyes open and eyes closed x 30 sec, standing feet together eyes open x 30 sec then eyes closed 30 sec x 2, standing feet together with head turns up/down and left/right x 10, marching on pillow  x 10 with verbal cues to move slow and controlled. Along counter: marching gait 8' x 4, tandem gait 8' x 4, toe walking/heel walking 8' x 4, stepping over 4 cones with tapping prior for SLS time x 4 laps with 1 UE support on counter, side stepping over cones with fingertip support on counter with verbal  cues to keep feet straight and step all the way over cone. Supervision with activities for safety.      Exercises   Exercises  Other Exercises    Other Exercises   Tall kneeling hip flexion/ext  10 on mat on floor holding to edge of mat table with verbal cues to go slow and controlled             PT Education - 08/07/19 1216    Education Details  Pt was instructed to try to obtain larger step/bench to help her at work when has to get on floor to get up. She was going to look in to. PT reviewed HEP from prior visit as had not been able to try yet. Instructed to try over weekend. Pt denied any questions.    Person(s) Educated  Patient    Methods  Explanation    Comprehension  Verbalized understanding       PT Short Term Goals - 08/01/19 1313      PT SHORT TERM GOAL #1   Title  Patient will be independent with initial HEP in order to build upon functional gains in therapy. ALL STGS DUE 08/31/19    Time  4    Period  Weeks    Status  New    Target Date  08/31/19      PT SHORT TERM GOAL #2    Title  Patient will improve FGA score to at least 18/30 in order to demonstrate decreased fall risk.    Baseline  14/30    Time  4    Period  Weeks    Status  New      PT SHORT TERM GOAL #3   Title  Patient will perform condition 4 of modified CTSIB for at least 20 seconds in order to demonstrate improved vestibular input for balance.    Baseline  10 seconds with supervision    Time  4    Period  Weeks    Status  New      PT SHORT TERM GOAL #4   Title  Patient will improve gait speed to at least 3.2 ft/sec in order to demonstrate improved community mobility.    Baseline  2.75 ft/sec    Time  4    Period  Weeks    Status  New      PT SHORT TERM GOAL #5   Title  Patient will perform at least 8 steps with supervision and single hand rail with step to vs step through pattern in order to safely perform stairs at patient's daughter and son's house.    Time  4    Period  Weeks    Status  New        PT Long Term Goals - 08/01/19 1316      PT LONG TERM GOAL #1   Title  Patient will be independent with final HEP in order to build upon functional gains in therapy. ALL LTGS DUE 09/30/19    Time  8    Period  Weeks    Status  New    Target Date  09/30/19      PT LONG TERM GOAL #2   Title  Patient will improve FGA score to at least 23/30 in order to demonstrate decreased fall risk.    Baseline  14/30 on 08/01/19    Time  8    Period  Weeks    Status  New  PT LONG TERM GOAL #3   Title  Patient will perform floor <> tall kneeling/quadraped transfer with mod I in order to feel more confident performing duties at work.    Time  8    Period  Weeks    Status  New      PT LONG TERM GOAL #4   Title  Patient will perform 15 steps with mod I and step through vs. step to pattern with single handrail and reports of improved confidence in order to safely perform stairs at her son and daughters house.    Time  8    Period  Weeks    Status  New      PT LONG TERM GOAL #5   Title   Patient will perform at least 15 sit <> stands during 30 second chair stand in order to demonstrate improved LE strength and perform within age related norms.    Baseline  12 sit <> stands with BUE support from low blue mat table.    Time  8    Period  Weeks    Status  New            Plan - 08/07/19 1217    Clinical Impression Statement  Pt had not been able to try HEP yet as was sore from new exercises last session. She did seem more confident with improving stability with balance exercises targeting vestibular system today compared to last visit. Pt was able to perform floor transfer but needs support surface to hold too. She is currently bringing small step stool in which is too low so suggested trying to get one a little taller. Will benefit from continued strengthening as well as unable to perform floor to stand without support.    Personal Factors and Comorbidities  Comorbidity 3+;Time since onset of injury/illness/exacerbation;Past/Current Experience    Comorbidities  hx of CVA (01/2018) A fib, HTN, chronic gout , depression    Examination-Activity Limitations  Stairs;Locomotion Level;Stand    Examination-Participation Restrictions  --   Work   Stability/Clinical Decision Making  Stable/Uncomplicated    Rehab Potential  Good    PT Frequency  2x / week   followed by 1x week for 4 weeks   PT Duration  4 weeks   followed by 1x week for 4 weeks   PT Treatment/Interventions  ADLs/Self Care Home Management;Gait training;Stair training;Functional mobility training;Therapeutic activities;Neuromuscular re-education;Balance training;Therapeutic exercise;Patient/family education    PT Next Visit Plan  See how HEP starting out as had not been able to try prior to last session. Check on if she was able to find higher bench/step to help with floor transfers at work. Continue with standing dynamic balance and gait activities and hip strengthening.    PT Home Exercise Plan  [id number]    Consulted  and Agree with Plan of Care  Patient       Patient will benefit from skilled therapeutic intervention in order to improve the following deficits and impairments:  Abnormal gait, Decreased strength, Difficulty walking, Decreased balance, Decreased activity tolerance  Visit Diagnosis: Unsteadiness on feet  Difficulty in walking, not elsewhere classified  Muscle weakness (generalized)     Problem List Patient Active Problem List   Diagnosis Date Noted  . Atrial fibrillation with RVR ([place]) 01/25/2018  . Ischemic stroke ([place]) 01/25/2018  . Hypertension 01/25/2018    Cherly Anderson, PT, DPT, NCS  08/07/2019, 12:24 PM  [facility] [address] [place], [place],  [id number] Phone: [phone]   Fax:  [phone]  Name: Graice Raggio MRN: [id number] Date of Birth: June 17, 1951

## 2019-08-12 ENCOUNTER — Ambulatory Visit: Payer: Medicare Other

## 2019-08-14 ENCOUNTER — Ambulatory Visit: Payer: Medicare Other | Admitting: Physical Therapy

## 2019-08-14 ENCOUNTER — Encounter: Payer: Self-pay | Admitting: Physical Therapy

## 2019-08-14 ENCOUNTER — Other Ambulatory Visit: Payer: Self-pay

## 2019-08-14 DIAGNOSIS — R262 Difficulty in walking, not elsewhere classified: Secondary | ICD-10-CM

## 2019-08-14 DIAGNOSIS — R2681 Unsteadiness on feet: Secondary | ICD-10-CM | POA: Diagnosis not present

## 2019-08-14 DIAGNOSIS — M6281 Muscle weakness (generalized): Secondary | ICD-10-CM

## 2019-08-14 NOTE — Therapy (Signed)
[facility] [address] [place], [place], [address] Phone: [phone]   Fax:  [phone]  Physical Therapy Treatment  Patient Details  Name: Diane Pruitt MRN: [id number] Date of Birth: Feb 07, 1951 Referring Provider (PT): [name], MD   Encounter Date: 08/14/2019  PT End of Session - 08/14/19 0849    Visit Number  4    Number of Visits  13    Date for PT Re-Evaluation  10/29/19   written for 60 day POC   Authorization Type  [employer] - Medicare    PT Start Time  [phone]    PT Stop Time  0928    PT Time Calculation (min)  41 min    Equipment Utilized During Treatment  Gait belt    Activity Tolerance  Patient tolerated treatment well    Behavior During Therapy  [facility] for tasks assessed/performed       Past Medical History:  Diagnosis Date  . Hypertension   . Stroke [facility])     Past Surgical History:  Procedure Laterality Date  . CESAREAN SECTION      There were no vitals filed for this visit.  Subjective Assessment - 08/14/19 0849    Subjective  No soreness, no pain. Still looking for a higher bench. Exercises at home are going well.    Pertinent History  A fib, HTN, chronic gout , major depressive disorder    Limitations  Walking    How long can you walk comfortably?  about a 1/2 mile.    Patient Stated Goals  to be able to climb steps without having a major trauma.    Currently in Pain?  No/denies                       [facility] Adult PT Treatment/Exercise - 08/14/19 0853      Self-Care   Other Self-Care Comments   Pt stating that she would like to join a gym nearby and was asking what would be beneficial for her at the gym - therapist stating exercise equipment like the NuStep or recumbent bike for aerobic activity and that we would continue to use here. Pt would also be willing to try treadmill for a walking program        Neuro Re-ed    Neuro Re-ed Details   In // bars - On blue foam  balance beam:  tandem walking forwards, 3 reps down and back - side stepping 3 reps down and back with cues for increased step height. On rockerboard: 2 x 10 reps A/P slow weight shifting with intermittent UE support, with feet together and eyes open 3 x 5 reps vertical head nods, 3 x 5 reps horizontal head turns.       Exercises   Exercises  Other Exercises;Lumbar    Other Exercises   NuStep BLE only for LE strengthening and endurance, 5 minutes at gear 4. Onto 4" step with intermittent UE support 2 x 10 reps B of each: forward step ups, lateral step ups           Balance Exercises - 08/14/19 0926      Balance Exercises: Standing   Standing Eyes Closed  Foam/compliant surface;Narrow base of support (BOS);3 reps;30 secs    Other Standing Exercises  Standing marching on foam with eyes open 2 x 10 reps. Feet hip width apart on foam with eyes closed alternating UE lifts  PT Short Term Goals - 08/01/19 1313      PT SHORT TERM GOAL #1   Title  Patient will be independent with initial HEP in order to build upon functional gains in therapy. ALL STGS DUE 08/31/19    Time  4    Period  Weeks    Status  New    Target Date  08/31/19      PT SHORT TERM GOAL #2   Title  Patient will improve FGA score to at least 18/30 in order to demonstrate decreased fall risk.    Baseline  14/30    Time  4    Period  Weeks    Status  New      PT SHORT TERM GOAL #3   Title  Patient will perform condition 4 of modified CTSIB for at least 20 seconds in order to demonstrate improved vestibular input for balance.    Baseline  10 seconds with supervision    Time  4    Period  Weeks    Status  New      PT SHORT TERM GOAL #4   Title  Patient will improve gait speed to at least 3.2 ft/sec in order to demonstrate improved community mobility.    Baseline  2.75 ft/sec    Time  4    Period  Weeks    Status  New      PT SHORT TERM GOAL #5   Title  Patient will perform at least 8 steps with  supervision and single hand rail with step to vs step through pattern in order to safely perform stairs at patient's daughter and son's house.    Time  4    Period  Weeks    Status  New        PT Long Term Goals - 08/01/19 1316      PT LONG TERM GOAL #1   Title  Patient will be independent with final HEP in order to build upon functional gains in therapy. ALL LTGS DUE 09/30/19    Time  8    Period  Weeks    Status  New    Target Date  09/30/19      PT LONG TERM GOAL #2   Title  Patient will improve FGA score to at least 23/30 in order to demonstrate decreased fall risk.    Baseline  14/30 on 08/01/19    Time  8    Period  Weeks    Status  New      PT LONG TERM GOAL #3   Title  Patient will perform floor <> tall kneeling/quadraped transfer with mod I in order to feel more confident performing duties at work.    Time  8    Period  Weeks    Status  New      PT LONG TERM GOAL #4   Title  Patient will perform 15 steps with mod I and step through vs. step to pattern with single handrail and reports of improved confidence in order to safely perform stairs at her son and daughters house.    Time  8    Period  Weeks    Status  New      PT LONG TERM GOAL #5   Title  Patient will perform at least 15 sit <> stands during 30 second chair stand in order to demonstrate improved LE strength and perform within age related norms.    Baseline  12  sit <> stands with BUE support from low blue mat table.    Time  8    Period  Weeks    Status  New            Plan - 08/14/19 [id number]    Clinical Impression Statement  Focus of today's session was LE strengthening and endurance, and standing dynamic balance on compliant surfaces. Pt fatigues easily with LLE during step ups - intermittent brief standing rest breaks taken throughout session today. With multiple reps, pt demonstrating improved ability to perform head turns/nods on rocker board without UE support and LOB. Pt also with improvement of  vestibular system for balance - able to stand with a narrow BOS and eyes closed on foam for 30 seconds today. Will continue to progress towards LTGs.    Personal Factors and Comorbidities  Comorbidity 3+;Time since onset of injury/illness/exacerbation;Past/Current Experience    Comorbidities  hx of CVA (01/2018) A fib, HTN, chronic gout , depression    Examination-Activity Limitations  Stairs;Locomotion Level;Stand    Examination-Participation Restrictions  --   Work   Stability/Clinical Decision Making  Stable/Uncomplicated    Rehab Potential  Good    PT Frequency  2x / week   followed by 1x week for 4 weeks   PT Duration  4 weeks   followed by 1x week for 4 weeks   PT Treatment/Interventions  ADLs/Self Care Home Management;Gait training;Stair training;Functional mobility training;Therapeutic activities;Neuromuscular re-education;Balance training;Therapeutic exercise;Patient/family education    PT Next Visit Plan  Tall kneeling/quadraped/half kneeling to simulate work activities. NuStep/Treadmill for aerobic exercise if pt decides to join gym - leg press machine. Check on if she was able to find higher bench/step to help with floor transfers at work. Continue with standing dynamic balance and gait activities and hip strengthening - try side stepping with t band.    PT Home Exercise Plan  [id number]    Consulted and Agree with Plan of Care  Patient       Patient will benefit from skilled therapeutic intervention in order to improve the following deficits and impairments:  Abnormal gait, Decreased strength, Difficulty walking, Decreased balance, Decreased activity tolerance  Visit Diagnosis: Unsteadiness on feet  Difficulty in walking, not elsewhere classified  Muscle weakness (generalized)     Problem List Patient Active Problem List   Diagnosis Date Noted  . Atrial fibrillation with RVR ([place]) 01/25/2018  . Ischemic stroke ([place]) 01/25/2018  . Hypertension 01/25/2018    Arliss Journey, PT, DPT  08/14/2019, 9:43 AM  [facility] [address] [place], [place], [address] Phone: [phone]   Fax:  [phone]  Name: Diane Pruitt MRN: [id number] Date of Birth: 12/20/1950

## 2019-08-19 ENCOUNTER — Other Ambulatory Visit: Payer: Self-pay

## 2019-08-19 ENCOUNTER — Ambulatory Visit: Payer: Medicare Other | Admitting: Physical Therapy

## 2019-08-19 DIAGNOSIS — R262 Difficulty in walking, not elsewhere classified: Secondary | ICD-10-CM

## 2019-08-19 DIAGNOSIS — M6281 Muscle weakness (generalized): Secondary | ICD-10-CM

## 2019-08-19 DIAGNOSIS — R2681 Unsteadiness on feet: Secondary | ICD-10-CM | POA: Diagnosis not present

## 2019-08-19 NOTE — Therapy (Signed)
[facility] [address] [place], [place], [address] Phone: [phone]   Fax:  [phone]  Physical Therapy Treatment  Patient Details  Name: Diane Pruitt MRN: [id number] Date of Birth: October 09, 1951 Referring Provider (PT): [name], MD   Encounter Date: 08/19/2019  PT End of Session - 08/19/19 1035    Visit Number  5    Number of Visits  13    Date for PT Re-Evaluation  10/29/19   written for 60 day POC   Authorization Type  [employer] - Medicare    PT Start Time  0935    PT Stop Time  1014    PT Time Calculation (min)  39 min    Equipment Utilized During Treatment  Gait belt    Activity Tolerance  Patient tolerated treatment well    Behavior During Therapy  [facility] for tasks assessed/performed       Past Medical History:  Diagnosis Date  . Hypertension   . Stroke [facility])     Past Surgical History:  Procedure Laterality Date  . CESAREAN SECTION      There were no vitals filed for this visit.  Subjective Assessment - 08/19/19 0938    Subjective  Felt pretty good after last session. No pain. Exercises are still going well.    Pertinent History  A fib, HTN, chronic gout , major depressive disorder    Limitations  Walking    How long can you walk comfortably?  about a 1/2 mile.    Patient Stated Goals  to be able to climb steps without having a major trauma.    Currently in Pain?  No/denies                       [facility] Adult PT Treatment/Exercise - 08/19/19 1041      High Level Balance   High Level Balance Comments  On blue floor mat with foam under B knees for increased comfort: 2 x 10 reps tall kneeling squats with cues for glute activation upon upright and without BUE support, cues for eccentric control. 2 x 10 reps lateral side stepping on knees in tall kneeling position to L, 1 x 10 reps to R. Seated at edge of mat 1 x 10 reps sit <> stands with wider BOS on blue foam, cues for eccentric  control.  In // bars with no UE support on blue foam lateral stepping strategy 1 x 10 reps with cues for higher step height and larger steps.       Therapeutic Activites    Therapeutic Activities  Other Therapeutic Activities    Other Therapeutic Activities  Attempted using a garden bench for pt to try to stand up from the floor to assist her at work because it is light weight and would be easier to carry around, tried getting into half kneeling position with both R and LLE forwards, pt with increased difficulty performing, however was able to come to stand using LLE anteriorly and min guard from therapist. PT looked up some higher stools/benches, pt could purchase - one was $15 from [facility] and was a 13" step that could be easily folded up to carry to work. Pt verbalized understanding and would want to go pick one up to try to use and work in order to come to stand easier.       Exercises   Exercises  Other Exercises    Other Exercises    NuStep BLE  only for LE strengthening and endurance, 5 minutes at gear 4.          Balance Exercises - 08/19/19 1043      Balance Exercises: Standing   Standing Eyes Closed  Foam/compliant surface;Narrow base of support (BOS);3 reps;20 secs    Other Standing Exercises  On blue foam: wider BOS and eyes closed 2 x 5 reps vertical head nods, 2 x 5 reps horizontal head turns        PT Education - 08/19/19 1036    Education Details  buying a 13" lightweight step from [employer] (therapist showed pt picture of one online) to purchase to help her at work when she is on the floor and has to get up    [employer]) Educated  Patient    Methods  Explanation;Demonstration    Comprehension  Verbalized understanding;Returned demonstration       PT Short Term Goals - 08/01/19 1313      PT SHORT TERM GOAL #1   Title  Patient will be independent with initial HEP in order to build upon functional gains in therapy. ALL STGS DUE 08/31/19    Time  4    Period  Weeks     Status  New    Target Date  08/31/19      PT SHORT TERM GOAL #2   Title  Patient will improve FGA score to at least 18/30 in order to demonstrate decreased fall risk.    Baseline  14/30    Time  4    Period  Weeks    Status  New      PT SHORT TERM GOAL #3   Title  Patient will perform condition 4 of modified CTSIB for at least 20 seconds in order to demonstrate improved vestibular input for balance.    Baseline  10 seconds with supervision    Time  4    Period  Weeks    Status  New      PT SHORT TERM GOAL #4   Title  Patient will improve gait speed to at least 3.2 ft/sec in order to demonstrate improved community mobility.    Baseline  2.75 ft/sec    Time  4    Period  Weeks    Status  New      PT SHORT TERM GOAL #5   Title  Patient will perform at least 8 steps with supervision and single hand rail with step to vs step through pattern in order to safely perform stairs at patient's daughter and son's house.    Time  4    Period  Weeks    Status  New        PT Long Term Goals - 08/01/19 1316      PT LONG TERM GOAL #1   Title  Patient will be independent with final HEP in order to build upon functional gains in therapy. ALL LTGS DUE 09/30/19    Time  8    Period  Weeks    Status  New    Target Date  09/30/19      PT LONG TERM GOAL #2   Title  Patient will improve FGA score to at least 23/30 in order to demonstrate decreased fall risk.    Baseline  14/30 on 08/01/19    Time  8    Period  Weeks    Status  New      PT LONG TERM GOAL #3   Title  Patient will perform floor <> tall kneeling/quadraped transfer with mod I in order to feel more confident performing duties at work.    Time  8    Period  Weeks    Status  New      PT LONG TERM GOAL #4   Title  Patient will perform 15 steps with mod I and step through vs. step to pattern with single handrail and reports of improved confidence in order to safely perform stairs at her son and daughters house.    Time  8     Period  Weeks    Status  New      PT LONG TERM GOAL #5   Title  Patient will perform at least 15 sit <> stands during 30 second chair stand in order to demonstrate improved LE strength and perform within age related norms.    Baseline  12 sit <> stands with BUE support from low blue mat table.    Time  8    Period  Weeks    Status  New            Plan - 08/19/19 1047    Clinical Impression Statement  Focus of today's session was LE strengthening, and NMR for vestibular input for balance. Pt fatigues easily and needed intermittent rest breaks throughout session. Pt reported feeling out of breath after performing lateral stepping strategy, checked pt's O2 sats - was at 94%. Instructed pt on pursed lip breathing exerises, within 20 seconds, O2 able to increase back up to 96-97%. Pt with increased difficulty coming to stand from half kneeling position with garden bench, instructed pt on what kind of higher step/bench she could look into purchasing to make this task at work easier, pt verbalized understanding. Will continue to progress towards LTGs.    Personal Factors and Comorbidities  Comorbidity 3+;Time since onset of injury/illness/exacerbation;Past/Current Experience    Comorbidities  hx of CVA (01/2018) A fib, HTN, chronic gout , depression    Examination-Activity Limitations  Stairs;Locomotion Level;Stand    Examination-Participation Restrictions  --   Work   Stability/Clinical Decision Making  Stable/Uncomplicated    Rehab Potential  Good    PT Frequency  2x / week   followed by 1x week for 4 weeks   PT Duration  4 weeks   followed by 1x week for 4 weeks   PT Treatment/Interventions  ADLs/Self Care Home Management;Gait training;Stair training;Functional mobility training;Therapeutic activities;Neuromuscular re-education;Balance training;Therapeutic exercise;Patient/family education    PT Next Visit Plan  Tall kneeling/quadraped/half kneeling to simulate work activities.  NuStep/Treadmill for aerobic exercise if pt decides to join gym - leg press machine. Check on if she was able to find higher bench/step to help with floor transfers at work. Continue with standing dynamic balance and gait activities and hip strengthening - try side stepping with t band.    PT Home Exercise Plan  [email or web address]    Consulted and Agree with Plan of Care  Patient       Patient will benefit from skilled therapeutic intervention in order to improve the following deficits and impairments:  Abnormal gait, Decreased strength, Difficulty walking, Decreased balance, Decreased activity tolerance  Visit Diagnosis: Unsteadiness on feet  Difficulty in walking, not elsewhere classified  Muscle weakness (generalized)     Problem List Patient Active Problem List   Diagnosis Date Noted  . Atrial fibrillation with RVR ([place]) 01/25/2018  . Ischemic stroke ([place]) 01/25/2018  . Hypertension 01/25/2018    Arliss Journey,  PT, DPT 08/19/2019, 10:50 AM  [facility] [address] [place], [place], [address] Phone: [phone]   Fax:  [phone]  Name: Diane Pruitt MRN: [id number] Date of Birth: 03/12/1951

## 2019-08-21 ENCOUNTER — Ambulatory Visit: Payer: Medicare Other

## 2019-08-21 ENCOUNTER — Other Ambulatory Visit: Payer: Self-pay

## 2019-08-21 DIAGNOSIS — M6281 Muscle weakness (generalized): Secondary | ICD-10-CM

## 2019-08-21 DIAGNOSIS — R2681 Unsteadiness on feet: Secondary | ICD-10-CM | POA: Diagnosis not present

## 2019-08-21 NOTE — Patient Instructions (Signed)
Access Code: [email or web address]  URL: [email or web address]  Date: 08/21/2019  Prepared by: Cherly Anderson   Exercises Toe Walking - 3 sets - 2x daily - 7x weekly Heel Walking - 3 sets - 2x daily - 7x weekly Tandem Walking with Counter Support - 3 sets - 3x daily - 7x weekly Tandem Stance - 3 sets - 20 hold - 2x daily - 7x weekly Sit to Stand - 10 reps - 2 sets - 2x daily - 7x weekly Supine Bridge - 5 reps - 2 sets - 3 hold - 2x daily - 7x weekly Romberg Stance on Foam Pad - 10 reps - 2 sets - 2x daily - 7x weekly Wide Stance with Eyes Closed on Foam Pad - 3 sets - 20 hold - 3x daily - 7x weekly Side Stepping with Resistance at Feet - 4 reps - 1 sets - 1x daily - 7x weekly

## 2019-08-21 NOTE — Therapy (Signed)
[facility] [address] [place], [place], [address] Phone: [phone]   Fax:  [phone]  Physical Therapy Treatment  Patient Details  Name: Diane Pruitt MRN: [id number] Date of Birth: [birth year] Referring Provider (PT): [name], MD   Encounter Date: 08/21/2019  PT End of Session - 08/21/19 1019    Visit Number  6    Number of Visits  13    Date for PT Re-Evaluation  10/29/19   written for 60 day POC   Authorization Type  [employer]HC - Medicare    PT Start Time  1017    PT Stop Time  1058    PT Time Calculation (min)  41 min    Equipment Utilized During Treatment  Gait belt    Activity Tolerance  Patient tolerated treatment well    Behavior During Therapy  [facility] for tasks assessed/performed       Past Medical History:  Diagnosis Date  . Hypertension   . Stroke [facility])     Past Surgical History:  Procedure Laterality Date  . CESAREAN SECTION      There were no vitals filed for this visit.  Subjective Assessment - 08/21/19 1018    Subjective  Pt reports she is doing well. Has not had a chance to look for stool but knows what she needs as has been busy this week.    Pertinent History  A fib, HTN, chronic gout , major depressive disorder    Limitations  Walking    How long can you walk comfortably?  about a 1/2 mile.    Patient Stated Goals  to be able to climb steps without having a major trauma.    Currently in Pain?  No/denies                       [facility] Adult PT Treatment/Exercise - 08/21/19 1021      Neuro Re-ed    Neuro Re-ed Details   Standing on rockerboard maintaining level x 30 sec eyes open and x 30 sec eyes closed 30 sec x 2 , tandem gait 6' x 6, Marching gait  20' x 2, Large gait forwards and backwards 20' x 2 each direction, reciprocal stepping over 4 cones with tapping first for increased SLS time x 2 laps with occasional UE support close SBA/CGA for safety.      Exercises    Exercises  Other Exercises    Other Exercises   NuStep x 5 min level 5. Side stepping with red theraband along counter 6' x 6, mini-squats x 10 on mat then x 10 on airex. Leg press 60# 15x1 and 10 x 1. Cuing to control at knees and not let lock out.             PT Education - 08/21/19 1103    Education Details  PT added hip abd sidestepping with red theraband    Person(s) Educated  Patient    Methods  Explanation;Handout;Demonstration    Comprehension  Verbalized understanding       PT Short Term Goals - 08/01/19 1313      PT SHORT TERM GOAL #1   Title  Patient will be independent with initial HEP in order to build upon functional gains in therapy. ALL STGS DUE 08/31/19    Time  4    Period  Weeks    Status  New    Target Date  08/31/19  PT SHORT TERM GOAL #2   Title  Patient will improve FGA score to at least 18/30 in order to demonstrate decreased fall risk.    Baseline  14/30    Time  4    Period  Weeks    Status  New      PT SHORT TERM GOAL #3   Title  Patient will perform condition 4 of modified CTSIB for at least 20 seconds in order to demonstrate improved vestibular input for balance.    Baseline  10 seconds with supervision    Time  4    Period  Weeks    Status  New      PT SHORT TERM GOAL #4   Title  Patient will improve gait speed to at least 3.2 ft/sec in order to demonstrate improved community mobility.    Baseline  2.75 ft/sec    Time  4    Period  Weeks    Status  New      PT SHORT TERM GOAL #5   Title  Patient will perform at least 8 steps with supervision and single hand rail with step to vs step through pattern in order to safely perform stairs at patient's daughter and son's house.    Time  4    Period  Weeks    Status  New        PT Long Term Goals - 08/01/19 1316      PT LONG TERM GOAL #1   Title  Patient will be independent with final HEP in order to build upon functional gains in therapy. ALL LTGS DUE 09/30/19    Time  8     Period  Weeks    Status  New    Target Date  09/30/19      PT LONG TERM GOAL #2   Title  Patient will improve FGA score to at least 23/30 in order to demonstrate decreased fall risk.    Baseline  14/30 on 08/01/19    Time  8    Period  Weeks    Status  New      PT LONG TERM GOAL #3   Title  Patient will perform floor <> tall kneeling/quadraped transfer with mod I in order to feel more confident performing duties at work.    Time  8    Period  Weeks    Status  New      PT LONG TERM GOAL #4   Title  Patient will perform 15 steps with mod I and step through vs. step to pattern with single handrail and reports of improved confidence in order to safely perform stairs at her son and daughters house.    Time  8    Period  Weeks    Status  New      PT LONG TERM GOAL #5   Title  Patient will perform at least 15 sit <> stands during 30 second chair stand in order to demonstrate improved LE strength and perform within age related norms.    Baseline  12 sit <> stands with BUE support from low blue mat table.    Time  8    Period  Weeks    Status  New            Plan - 08/21/19 1104    Clinical Impression Statement  Pt able to tolerate longer bouts of activity with only minimal breaks today. Challenged with vestibular balance activities and dynamic  gait with increasing SLS time.    Personal Factors and Comorbidities  Comorbidity 3+;Time since onset of injury/illness/exacerbation;Past/Current Experience    Comorbidities  hx of CVA (01/2018) A fib, HTN, chronic gout , depression    Examination-Activity Limitations  Stairs;Locomotion Level;Stand    Examination-Participation Restrictions  --   Work   Stability/Clinical Decision Making  Stable/Uncomplicated    Rehab Potential  Good    PT Frequency  2x / week   followed by 1x week for 4 weeks   PT Duration  4 weeks   followed by 1x week for 4 weeks   PT Treatment/Interventions  ADLs/Self Care Home Management;Gait training;Stair  training;Functional mobility training;Therapeutic activities;Neuromuscular re-education;Balance training;Therapeutic exercise;Patient/family education    PT Next Visit Plan  NuStep/Treadmill for aerobic exercise if pt decides to join gym - leg press machine. Check on if she was able to find higher bench/step to help with floor transfers at work as had not had chance to try yet. Continue with standing dynamic balance and gait activities and hip strengthening    PT Home Exercise Plan  [email or web address]    Consulted and Agree with Plan of Care  Patient       Patient will benefit from skilled therapeutic intervention in order to improve the following deficits and impairments:  Abnormal gait, Decreased strength, Difficulty walking, Decreased balance, Decreased activity tolerance  Visit Diagnosis: Unsteadiness on feet  Muscle weakness (generalized)     Problem List Patient Active Problem List   Diagnosis Date Noted  . Atrial fibrillation with RVR ([place]) 01/25/2018  . Ischemic stroke ([place]) 01/25/2018  . Hypertension 01/25/2018    Electa Sniff, PT, DPT, NCS 08/21/2019, 12:17 PM  [facility] [address] [place], [place], [address] Phone: [phone]   Fax:  [phone]  Name: Diane Pruitt MRN: [id number] Date of Birth: May 31, 1951

## 2019-08-26 ENCOUNTER — Ambulatory Visit: Payer: Medicare Other | Admitting: Physical Therapy

## 2019-08-26 ENCOUNTER — Other Ambulatory Visit: Payer: Self-pay

## 2019-08-26 ENCOUNTER — Encounter: Payer: Self-pay | Admitting: Physical Therapy

## 2019-08-26 DIAGNOSIS — R2681 Unsteadiness on feet: Secondary | ICD-10-CM | POA: Diagnosis not present

## 2019-08-26 DIAGNOSIS — R262 Difficulty in walking, not elsewhere classified: Secondary | ICD-10-CM

## 2019-08-26 DIAGNOSIS — M6281 Muscle weakness (generalized): Secondary | ICD-10-CM

## 2019-08-26 NOTE — Therapy (Signed)
[facility] [address] [place], [place], [address] Phone: [phone]   Fax:  [phone]  Physical Therapy Treatment  Patient Details  Name: Diane Pruitt MRN: [id number] Date of Birth: 1951-08-06 Referring Provider (PT): [name], MD   Encounter Date: 08/26/2019  PT End of Session - 08/26/19 1143    Visit Number  7    Number of Visits  13    Date for PT Re-Evaluation  10/29/19   written for 60 day POC   Authorization Type  [employer] - Medicare    PT Start Time  0935    PT Stop Time  1014    PT Time Calculation (min)  39 min    Equipment Utilized During Treatment  Gait belt    Activity Tolerance  Patient tolerated treatment well    Behavior During Therapy  [facility] for tasks assessed/performed       Past Medical History:  Diagnosis Date  . Hypertension   . Stroke [facility])     Past Surgical History:  Procedure Laterality Date  . CESAREAN SECTION      There were no vitals filed for this visit.  Subjective Assessment - 08/26/19 0937    Subjective  She is doing well, no falls. Not as motivated to do exercises at home. Going to go out this afternoon to buy a stool.    Pertinent History  A fib, HTN, chronic gout , major depressive disorder    Limitations  Walking    How long can you walk comfortably?  about a 1/2 mile.    Patient Stated Goals  to be able to climb steps without having a major trauma.    Currently in Pain?  No/denies                       [facility] Adult PT Treatment/Exercise - 08/26/19 1151      Therapeutic Activites    Other Therapeutic Activities  Trialed getting on and off floor using garden bench to simulate work activities (per pt request), therapist teaching pt half tall kneeling position with LLE placed anteriorly and using BUE to help push on garden bench to come to stand. Pt needing cues for correct technique as pt has tendency to externally rotate L leg and when coming to  stand is putting excess force on L knee, described this to pt, however pt unable to get in proper half kneeling position on L and is more difficult for her to stand when she does and needs min A from therapist. Hanley Seamen pt a picture of step stool from [employer] that she can use that is taller to help her come to stand, pt verbalized understanding.        Neuro Re-ed    Neuro Re-ed Details   Standing on blue mat: SLS taps 2 x 10 reps B with double taps on colorful floor bubbles, intermittent UE support.  Standing on double folded blue floor mat: with LLE staggered behind right 1 x 5, and 1 x 10 reps sit <> stands with focus on eccentric control and glute activation in standing.       Exercises   Other Exercises   2 x 10 reps L clamshells with red theraband, cues for technique.           Balance Exercises - 08/26/19 0956      Balance Exercises: Standing   Standing Eyes Closed  Narrow base of support (BOS);3 reps;30 secs;Foam/compliant surface  double folded blue floor mat   Tandem Stance  Intermittent upper extremity support;Eyes open;3 reps;20 secs   B on blue floor mat   Other Standing Exercises  On double folded blue floor mat feet hip width distance 1 x 10 reps horizontal head turns then 1 x 10 reps vertical head nods        PT Education - 08/26/19 1142    Education Details  provided pt with printout of step stool to look for at [employer]) Educated  Patient    Methods  Explanation;Handout    Comprehension  Verbalized understanding       PT Short Term Goals - 08/01/19 1313      PT SHORT TERM GOAL #1   Title  Patient will be independent with initial HEP in order to build upon functional gains in therapy. ALL STGS DUE 08/31/19    Time  4    Period  Weeks    Status  New    Target Date  08/31/19      PT SHORT TERM GOAL #2   Title  Patient will improve FGA score to at least 18/30 in order to demonstrate decreased fall risk.    Baseline  14/30    Time  4    Period  Weeks     Status  New      PT SHORT TERM GOAL #3   Title  Patient will perform condition 4 of modified CTSIB for at least 20 seconds in order to demonstrate improved vestibular input for balance.    Baseline  10 seconds with supervision    Time  4    Period  Weeks    Status  New      PT SHORT TERM GOAL #4   Title  Patient will improve gait speed to at least 3.2 ft/sec in order to demonstrate improved community mobility.    Baseline  2.75 ft/sec    Time  4    Period  Weeks    Status  New      PT SHORT TERM GOAL #5   Title  Patient will perform at least 8 steps with supervision and single hand rail with step to vs step through pattern in order to safely perform stairs at patient's daughter and son's house.    Time  4    Period  Weeks    Status  New        PT Long Term Goals - 08/01/19 1316      PT LONG TERM GOAL #1   Title  Patient will be independent with final HEP in order to build upon functional gains in therapy. ALL LTGS DUE 09/30/19    Time  8    Period  Weeks    Status  New    Target Date  09/30/19      PT LONG TERM GOAL #2   Title  Patient will improve FGA score to at least 23/30 in order to demonstrate decreased fall risk.    Baseline  14/30 on 08/01/19    Time  8    Period  Weeks    Status  New      PT LONG TERM GOAL #3   Title  Patient will perform floor <> tall kneeling/quadraped transfer with mod I in order to feel more confident performing duties at work.    Time  8    Period  Weeks    Status  New  PT LONG TERM GOAL #4   Title  Patient will perform 15 steps with mod I and step through vs. step to pattern with single handrail and reports of improved confidence in order to safely perform stairs at her son and daughters house.    Time  8    Period  Weeks    Status  New      PT LONG TERM GOAL #5   Title  Patient will perform at least 15 sit <> stands during 30 second chair stand in order to demonstrate improved LE strength and perform within age related norms.     Baseline  12 sit <> stands with BUE support from low blue mat table.    Time  8    Period  Weeks    Status  New            Plan - 08/26/19 1143    Clinical Impression Statement  Focus of today's skilled session was floor > stand transfers with garden bench to assist with work activities, SLS activities, and standing balance on compliant surfaces. Pt needing min guard for all balance activities and ocassional UE support on chair. Will continue to progress towards LTGs.    Personal Factors and Comorbidities  Comorbidity 3+;Time since onset of injury/illness/exacerbation;Past/Current Experience    Comorbidities  hx of CVA (01/2018) A fib, HTN, chronic gout , depression    Examination-Activity Limitations  Stairs;Locomotion Level;Stand    Examination-Participation Restrictions  --   Work   Stability/Clinical Decision Making  Stable/Uncomplicated    Rehab Potential  Good    PT Frequency  2x / week   followed by 1x week for 4 weeks   PT Duration  4 weeks   followed by 1x week for 4 weeks   PT Treatment/Interventions  ADLs/Self Care Home Management;Gait training;Stair training;Functional mobility training;Therapeutic activities;Neuromuscular re-education;Balance training;Therapeutic exercise;Patient/family education    PT Next Visit Plan  check STGs. Step ups/stair training. NuStep/Treadmill for aerobic exercise if pt decides to join gym - leg press machine. Check on if she was able to find higher bench/step to help with floor transfers at work as had not had chance to try yet. Continue with standing dynamic balance and gait activities and hip strengthening    PT Home Exercise Plan  [email or web address]    Consulted and Agree with Plan of Care  Patient       Patient will benefit from skilled therapeutic intervention in order to improve the following deficits and impairments:  Abnormal gait, Decreased strength, Difficulty walking, Decreased balance, Decreased activity tolerance  Visit  Diagnosis: Unsteadiness on feet  Muscle weakness (generalized)  Difficulty in walking, not elsewhere classified     Problem List Patient Active Problem List   Diagnosis Date Noted  . Atrial fibrillation with RVR ([place]) 01/25/2018  . Ischemic stroke ([place]) 01/25/2018  . Hypertension 01/25/2018    Arliss Journey, PT, DPT  08/26/2019, 11:55 AM  [facility] [address] [place], [place], [address] Phone: [phone]   Fax:  [phone]  Name: Diane Pruitt MRN: [id number] Date of Birth: 1951-04-07

## 2019-08-28 ENCOUNTER — Other Ambulatory Visit: Payer: Self-pay

## 2019-08-28 ENCOUNTER — Ambulatory Visit: Payer: Medicare Other | Admitting: Physical Therapy

## 2019-08-28 ENCOUNTER — Encounter: Payer: Self-pay | Admitting: Physical Therapy

## 2019-08-28 DIAGNOSIS — R262 Difficulty in walking, not elsewhere classified: Secondary | ICD-10-CM

## 2019-08-28 DIAGNOSIS — R2681 Unsteadiness on feet: Secondary | ICD-10-CM

## 2019-08-28 DIAGNOSIS — M6281 Muscle weakness (generalized): Secondary | ICD-10-CM

## 2019-08-28 NOTE — Patient Instructions (Signed)
Access Code: [email or web address]  URL: [email or web address]  Date: 08/28/2019  Prepared by: Janann August   Exercises Toe Walking - 3 sets - 2x daily - 7x weekly Heel Walking - 3 sets - 2x daily - 7x weekly Tandem Walking with Counter Support - 3 sets - 3x daily - 7x weekly Tandem Stance - 3 sets - 30 hold - 2x daily - 7x weekly Wide Stance with Eyes Closed on Foam Pad - 2 sets - 5 reps - 2x daily - 7x weekly Standing Balance with Eyes Closed on Foam - 3 sets - 20 hold - 2x daily - 7x weekly Sit to Stand - 10 reps - 2 sets - 2x daily - 7x weekly Side Stepping with Resistance at Feet - 4 reps - 1 sets - 1x daily - 7x weekly Standing Marching - 10 reps - 2 sets - 2x daily - 7x weekly

## 2019-08-28 NOTE — Therapy (Signed)
[facility] [address] [place], [place], [address] Phone: [phone]   Fax:  [phone]  Physical Therapy Treatment  Patient Details  Name: Diane Pruitt MRN: [id number] Date of Birth: 01-13-1951 Referring Provider (PT): [name], MD   Encounter Date: 08/28/2019  PT End of Session - 08/28/19 1013    Visit Number  8    Number of Visits  13    Date for PT Re-Evaluation  10/29/19   written for 60 day POC   Authorization Type  [employer]HC - Medicare    PT Start Time  0930    PT Stop Time  1013    PT Time Calculation (min)  43 min    Equipment Utilized During Treatment  Gait belt    Activity Tolerance  Patient tolerated treatment well    Behavior During Therapy  [facility] for tasks assessed/performed       Past Medical History:  Diagnosis Date  . Hypertension   . Stroke [facility])     Past Surgical History:  Procedure Laterality Date  . CESAREAN SECTION      There were no vitals filed for this visit.  Subjective Assessment - 08/28/19 0932    Subjective  Bought a stool the other day, has not had a chance to use it yet. Also ordering knee pads to help assist in getting on and off the floor.    Pertinent History  A fib, HTN, chronic gout , major depressive disorder    Limitations  Walking    How long can you walk comfortably?  about a 1/2 mile.    Patient Stated Goals  to be able to climb steps without having a major trauma.    Currently in Pain?  No/denies         [facility] PT Assessment - 08/28/19 0939      Ambulation/Gait   Ambulation/Gait  Yes    Gait velocity  12.1 seconds = 2.71 ft/sec      Functional Gait  Assessment   Gait assessed   Yes    Gait Level Surface  Walks 20 ft in less than 7 sec but greater than 5.5 sec, uses assistive device, slower speed, mild gait deviations, or deviates 6-10 in outside of the 12 in walkway width.   5.97   Change in Gait Speed  Able to change speed, demonstrates mild gait  deviations, deviates 6-10 in outside of the 12 in walkway width, or no gait deviations, unable to achieve a major change in velocity, or uses a change in velocity, or uses an assistive device.    Gait with Horizontal Head Turns  Performs head turns smoothly with slight change in gait velocity (eg, minor disruption to smooth gait path), deviates 6-10 in outside 12 in walkway width, or uses an assistive device.    Gait with Vertical Head Turns  Performs task with slight change in gait velocity (eg, minor disruption to smooth gait path), deviates 6 - 10 in outside 12 in walkway width or uses assistive device    Gait and Pivot Turn  Pivot turns safely within 3 sec and stops quickly with no loss of balance.    Step Over Obstacle  Is able to step over one shoe box (4.5 in total height) without changing gait speed. No evidence of imbalance.    Gait with Narrow Base of Support  Ambulates less than 4 steps heel to toe or cannot perform without assistance.    Gait with Eyes  Closed  Walks 20 ft, slow speed, abnormal gait pattern, evidence for imbalance, deviates 10-15 in outside 12 in walkway width. Requires more than 9 sec to ambulate 20 ft.   9.59 seconds   Ambulating Backwards  Walks 20 ft, uses assistive device, slower speed, mild gait deviations, deviates 6-10 in outside 12 in walkway width.    Steps  Alternating feet, must use rail.    Total Score  18    FGA comment:  18/30                   [facility] Adult PT Treatment/Exercise - 08/28/19 0939      Ambulation/Gait   Ambulation/Gait Assistance  6: Modified independent (Device/Increase time)    Ambulation Distance (Feet)  200 Feet   approx clinic distances   Assistive device  None    Gait Pattern  Step-through pattern;Decreased arm swing - left;Decreased trunk rotation;Wide base of support    Ambulation Surface  Level;Indoor      Neuro Re-ed    Neuro Re-ed Details   Standing at countertop on blue floor mat for compliant surface and single  UE support: forward stepping over smaller hurdle 1 x 5 reps B, with cues for increased hip/knee flexion on L to avoid compensation. Lateral stepping with single UE support 1 x 5 reps B. Pt fatigues easily with this exercise and towards end had increased difficulty clearing LE.           Balance Exercises - 08/28/19 0939      Balance Exercises: Standing   Standing Eyes Closed  Narrow base of support (BOS);Foam/compliant surface;3 reps;30 secs       Access Code: [email or web address]  URL: [email or web address]  Date: 08/28/2019  Prepared by: Janann August   Upgraded/reviewed HEP as appropriate.   Exercises Toe Walking - 3 sets - 2x daily - 7x weekly Heel Walking - 3 sets - 2x daily - 7x weekly Tandem Walking with Counter Support - 3 sets - 3x daily - 7x weekly -reviewed at countertop, pt to perform with heel > toe pattern and practice without UE support  Tandem Stance - 3 sets - 30 hold - 2x daily - 7x weekly - pt had not been doing this at home, tandem stance holds at countertop 3 x 30 seconds, cues for technique  Wide Stance with Eyes Closed on Foam Pad - 2 sets - 5 reps - 2x daily - 7x weekly -with head nods 2 x 5 reps, with head turns 2 x 5 reps, new addition to HEP Standing Balance with Eyes Closed on Foam - 3 sets - 20 hold - 2x daily - 7x weekly - upgraded to narrower BOS Sit to Stand - 10 reps - 2 sets - 2x daily - 7x weekly Side Stepping with Resistance at Feet - 4 reps - 1 sets - 1x daily - 7x weekly Standing Marching - 10 reps - 2 sets - 2x daily - 7x weekly -new addition, 2 x 10 reps with focus on weight shifting and slow controlled movement     PT Short Term Goals - 08/28/19 0934      PT SHORT TERM GOAL #1   Title  Patient will be independent with initial HEP in order to build upon functional gains in therapy. ALL STGS DUE 08/31/19    Baseline  partially met, has not been doing tandem stance at home.    Time  4    Period  Weeks    Status  Partially  Met    Target  Date  08/31/19      PT SHORT TERM GOAL #2   Title  Patient will improve FGA score to at least 18/30 in order to demonstrate decreased fall risk.    Baseline  18/30    Time  4    Period  Weeks    Status  Achieved      PT SHORT TERM GOAL #3   Title  Patient will perform condition 4 of modified CTSIB for at least 20 seconds in order to demonstrate improved vestibular input for balance.    Baseline  30 seconds with supervision.    Time  4    Period  Weeks    Status  Achieved      PT SHORT TERM GOAL #4   Title  Patient will improve gait speed to at least 3.2 ft/sec in order to demonstrate improved community mobility.    Baseline  2.71 ft/sec on 08/28/19    Time  4    Period  Weeks    Status  Not Met      PT SHORT TERM GOAL #5   Title  Patient will perform at least 8 steps with supervision and single hand rail with step to vs step through pattern in order to safely perform stairs at patient's daughter and son's house.    Baseline  8 steps with supervision and single hand rail with step through pattern on 08/28/19    Time  4    Period  Weeks    Status  Achieved        PT Long Term Goals - 08/01/19 1316      PT LONG TERM GOAL #1   Title  Patient will be independent with final HEP in order to build upon functional gains in therapy. ALL LTGS DUE 09/30/19    Time  8    Period  Weeks    Status  New    Target Date  09/30/19      PT LONG TERM GOAL #2   Title  Patient will improve FGA score to at least 23/30 in order to demonstrate decreased fall risk.    Baseline  14/30 on 08/01/19    Time  8    Period  Weeks    Status  New      PT LONG TERM GOAL #3   Title  Patient will perform floor <> tall kneeling/quadraped transfer with mod I in order to feel more confident performing duties at work.    Time  8    Period  Weeks    Status  New      PT LONG TERM GOAL #4   Title  Patient will perform 15 steps with mod I and step through vs. step to pattern with single handrail and reports of  improved confidence in order to safely perform stairs at her son and daughters house.    Time  8    Period  Weeks    Status  New      PT LONG TERM GOAL #5   Title  Patient will perform at least 15 sit <> stands during 30 second chair stand in order to demonstrate improved LE strength and perform within age related norms.    Baseline  12 sit <> stands with BUE support from low blue mat table.    Time  8    Period  Weeks    Status  New  Plan - 08/28/19 1114    Clinical Impression Statement  Focus of today's skilled session was assessing pt's STGs. Pt had met 3 of 5 STGs. Pt has improved her mCTSIB time on condition 4 to 30 seconds, demonstrating improved vestibular input for balance, however did have some postural sway but no LOB. Pt has improved her FGA score by 4 to 18/30 - but still is at an increased risk for falls. Pt's gait speed demonstrated little change since last visit, per pt this is her normal walking speed. Remainder of session focused on NMR for single leg stance and stepping over obstacles on compliant surfaces, with pt needing single UE support on counter. Pt is progressing well, will continue to progress towards LTGs.    Personal Factors and Comorbidities  Comorbidity 3+;Time since onset of injury/illness/exacerbation;Past/Current Experience    Comorbidities  hx of CVA (01/2018) A fib, HTN, chronic gout , depression    Examination-Activity Limitations  Stairs;Locomotion Level;Stand    Examination-Participation Restrictions  --   Work   Stability/Clinical Decision Making  Stable/Uncomplicated    Rehab Potential  Good    PT Frequency  2x / week   followed by 1x week for 4 weeks   PT Duration  4 weeks   followed by 1x week for 4 weeks   PT Treatment/Interventions  ADLs/Self Care Home Management;Gait training;Stair training;Functional mobility training;Therapeutic activities;Neuromuscular re-education;Balance training;Therapeutic exercise;Patient/family education     PT Next Visit Plan  How was HEP? Continue balance for vestibular system with eyes closed, tandem stance/narrow BOS, stepping over obstacles. Step ups/stair training.  Check on if she was able to find higher bench/step to help with floor transfers at work as had not had chance to try yet. Continue with standing dynamic balance and gait activities and hip strengthening    PT Home Exercise Plan  [email or web address]    Consulted and Agree with Plan of Care  Patient       Patient will benefit from skilled therapeutic intervention in order to improve the following deficits and impairments:  Abnormal gait, Decreased strength, Difficulty walking, Decreased balance, Decreased activity tolerance  Visit Diagnosis: Muscle weakness (generalized)  Unsteadiness on feet  Difficulty in walking, not elsewhere classified     Problem List Patient Active Problem List   Diagnosis Date Noted  . Atrial fibrillation with RVR ([place]) 01/25/2018  . Ischemic stroke ([place]) 01/25/2018  . Hypertension 01/25/2018    Arliss Journey, PT, DPT  08/28/2019, 11:20 AM  [facility] [address] [place], [place], [address] Phone: [phone]   Fax:  [phone]  Name: Diane Pruitt MRN: [id number] Date of Birth: 1951/04/28

## 2019-09-04 ENCOUNTER — Ambulatory Visit: Payer: Medicare Other | Attending: Family Medicine | Admitting: Physical Therapy

## 2019-09-04 ENCOUNTER — Other Ambulatory Visit: Payer: Self-pay

## 2019-09-04 ENCOUNTER — Encounter: Payer: Self-pay | Admitting: Physical Therapy

## 2019-09-04 DIAGNOSIS — R2681 Unsteadiness on feet: Secondary | ICD-10-CM | POA: Insufficient documentation

## 2019-09-04 DIAGNOSIS — R262 Difficulty in walking, not elsewhere classified: Secondary | ICD-10-CM

## 2019-09-04 DIAGNOSIS — M6281 Muscle weakness (generalized): Secondary | ICD-10-CM | POA: Diagnosis present

## 2019-09-04 NOTE — Therapy (Signed)
[facility] [address] [place], [place], [address] Phone: [phone]   Fax:  [phone]  Physical Therapy Treatment  Patient Details  Name: Diane Pruitt MRN: [id number] Date of Birth: 06/25/1951 Referring Provider (PT): [name], MD   Encounter Date: 09/04/2019  PT End of Session - 09/04/19 1147    Visit Number  9    Number of Visits  13    Date for PT Re-Evaluation  10/29/19   written for 60 day POC   Authorization Type  [employer] - Medicare    PT Start Time  1102    PT Stop Time  1142    PT Time Calculation (min)  40 min    Equipment Utilized During Treatment  Gait belt    Activity Tolerance  Patient tolerated treatment well    Behavior During Therapy  [facility] for tasks assessed/performed       Past Medical History:  Diagnosis Date  . Hypertension   . Stroke [facility])     Past Surgical History:  Procedure Laterality Date  . CESAREAN SECTION      There were no vitals filed for this visit.  Subjective Assessment - 09/04/19 1103    Subjective  States that the stool is working pretty well. Her knee pads didn't fit. Has not had a chance to try the new exercises.    Pertinent History  A fib, HTN, chronic gout , major depressive disorder    Limitations  Walking    How long can you walk comfortably?  about a 1/2 mile.    Patient Stated Goals  to be able to climb steps without having a major trauma.    Currently in Pain?  No/denies                       [facility] Adult PT Treatment/Exercise - 09/04/19 1149      Ambulation/Gait   Ambulation/Gait  Yes    Ambulation/Gait Assistance  5: Supervision    Ambulation/Gait Assistance Details  Around therapy gym with dynamic gait activities - speeding up gait speed, retro walking, asking pt to stop walking, walking with head turns/nods - no LOB, one episode of veering when performing head turns    Ambulation Distance (Feet)  350 Feet    Assistive device   None    Gait Pattern  Step-through pattern;Decreased arm swing - left;Wide base of support    Ambulation Surface  Level;Indoor      Neuro Re-ed    Neuro Re-ed Details   Standing in // bars with intermittent UE support: On rocker board: static hold with 2 x 10 reps horizontal head turns, 2 x 10 reps vertical head nods, 1 x 10 reps alternating LEs stepping on and off board, 1 x 10 reps standing on board multi-directional reaching towards cone on L. On blue foam balance beam tandem walking down and back 2 reps. SLS cone taps 1 x 10 reps alternating LEs with bilateral fingertip support.       Exercises   Other Exercises   With single UE support at stairs LLE forward step ups 1 x 10 reps, cues for technique and eccentric control. Side stepping down and back 2 times with red theraband at distal thighs, cues for technique and performing with mini squat. Static holding a slight mini squat with LLE stepping out with RLE 2 x 10 reps for L hip ABD strengthening. Side stepping down and back 2 times with red  theraband at ankles.           Balance Exercises - 09/04/19 1152      Balance Exercises: Standing   Standing Eyes Closed  Narrow base of support (BOS);Foam/compliant surface;3 reps;30 secs        PT Education - 09/04/19 1147    Education Details  importance of performing HEP 5x a week, pt has not been performing at home.    Person(s) Educated  Patient    Methods  Explanation    Comprehension  Verbalized understanding       PT Short Term Goals - 08/28/19 0934      PT SHORT TERM GOAL #1   Title  Patient will be independent with initial HEP in order to build upon functional gains in therapy. ALL STGS DUE 08/31/19    Baseline  partially met, has not been doing tandem stance at home.    Time  4    Period  Weeks    Status  Partially Met    Target Date  08/31/19      PT SHORT TERM GOAL #2   Title  Patient will improve FGA score to at least 18/30 in order to demonstrate decreased fall risk.     Baseline  18/30    Time  4    Period  Weeks    Status  Achieved      PT SHORT TERM GOAL #3   Title  Patient will perform condition 4 of modified CTSIB for at least 20 seconds in order to demonstrate improved vestibular input for balance.    Baseline  30 seconds with supervision.    Time  4    Period  Weeks    Status  Achieved      PT SHORT TERM GOAL #4   Title  Patient will improve gait speed to at least 3.2 ft/sec in order to demonstrate improved community mobility.    Baseline  2.71 ft/sec on 08/28/19    Time  4    Period  Weeks    Status  Not Met      PT SHORT TERM GOAL #5   Title  Patient will perform at least 8 steps with supervision and single hand rail with step to vs step through pattern in order to safely perform stairs at patient's daughter and son's house.    Baseline  8 steps with supervision and single hand rail with step through pattern on 08/28/19    Time  4    Period  Weeks    Status  Achieved        PT Long Term Goals - 08/01/19 1316      PT LONG TERM GOAL #1   Title  Patient will be independent with final HEP in order to build upon functional gains in therapy. ALL LTGS DUE 09/30/19    Time  8    Period  Weeks    Status  New    Target Date  09/30/19      PT LONG TERM GOAL #2   Title  Patient will improve FGA score to at least 23/30 in order to demonstrate decreased fall risk.    Baseline  14/30 on 08/01/19    Time  8    Period  Weeks    Status  New      PT LONG TERM GOAL #3   Title  Patient will perform floor <> tall kneeling/quadraped transfer with mod I in order to feel more confident  performing duties at work.    Time  8    Period  Weeks    Status  New      PT LONG TERM GOAL #4   Title  Patient will perform 15 steps with mod I and step through vs. step to pattern with single handrail and reports of improved confidence in order to safely perform stairs at her son and daughters house.    Time  8    Period  Weeks    Status  New      PT LONG  TERM GOAL #5   Title  Patient will perform at least 15 sit <> stands during 30 second chair stand in order to demonstrate improved LE strength and perform within age related norms.    Baseline  12 sit <> stands with BUE support from low blue mat table.    Time  8    Period  Weeks    Status  New            Plan - 09/04/19 1153    Clinical Impression Statement  Focus of today's skilled session was LE strengthening and balance strategies on compliant surfaces. Pt with no LOB during dynamic gait activities today. Pt continues to need intermittent UE/fingertip support in // bars for balance exercises - especially when standing on rocker board and reaching outside of BOS to L. Educated pt on importance of performing HEP at home in order to continue building on strength and balance gains made in therapy, pt verbalized understanding. Will continue to progress towards LTGs.    Personal Factors and Comorbidities  Comorbidity 3+;Time since onset of injury/illness/exacerbation;Past/Current Experience    Comorbidities  hx of CVA (01/2018) A fib, HTN, chronic gout , depression    Examination-Activity Limitations  Stairs;Locomotion Level;Stand    Examination-Participation Restrictions  --   Work   Stability/Clinical Decision Making  Stable/Uncomplicated    Rehab Potential  Good    PT Frequency  2x / week   followed by 1x week for 4 weeks   PT Duration  4 weeks   followed by 1x week for 4 weeks   PT Treatment/Interventions  ADLs/Self Care Home Management;Gait training;Stair training;Functional mobility training;Therapeutic activities;Neuromuscular re-education;Balance training;Therapeutic exercise;Patient/family education    PT Next Visit Plan  needs 10th visit progress note. Continue balance for vestibular system with eyes closed, tandem stance/narrow BOS, stepping over obstacles. Step ups/stair training.  Check on if she was able to find higher bench/step to help with floor transfers at work as had not  had chance to try yet. Continue with standing dynamic balance and gait activities and hip strengthening    PT Home Exercise Plan  [id number]    Consulted and Agree with Plan of Care  Patient       Patient will benefit from skilled therapeutic intervention in order to improve the following deficits and impairments:  Abnormal gait, Decreased strength, Difficulty walking, Decreased balance, Decreased activity tolerance  Visit Diagnosis: Unsteadiness on feet  Difficulty in walking, not elsewhere classified  Muscle weakness (generalized)     Problem List Patient Active Problem List   Diagnosis Date Noted  . Atrial fibrillation with RVR ([place]) 01/25/2018  . Ischemic stroke ([place]) 01/25/2018  . Hypertension 01/25/2018    Lillia Pauls, DPT 09/04/2019, 11:55 AM  [facility] [address] [place], [place], [address] Phone: [phone]   Fax:  [phone]  Name: Diane Pruitt MRN: [id number] Date of Birth: 1951/02/15

## 2019-09-11 ENCOUNTER — Ambulatory Visit: Payer: Medicare Other | Admitting: Physical Therapy

## 2019-09-18 ENCOUNTER — Ambulatory Visit: Payer: Medicare Other

## 2019-09-23 ENCOUNTER — Ambulatory Visit: Payer: Medicare Other | Admitting: Physical Therapy

## 2019-12-04 ENCOUNTER — Encounter: Payer: Self-pay | Admitting: Physical Therapy

## 2019-12-04 NOTE — Therapy (Signed)
[facility] [address] [place], [place], [address] Phone: [phone]   Fax:  [phone]  Patient Details  Name: [name] MRN: [id number] Date of Birth: [birth year] Referring Provider:  No ref. provider found  Encounter Date: 12/04/2019   Pt no showed/cancelled last 3 remaining PT visits.   PHYSICAL THERAPY DISCHARGE SUMMARY  Visits from Start of Care: 9  Current functional level related to goals / functional outcomes: Unable to check LTGs due to pt not returning.    Remaining deficits: Balance impairments (primarily vestibular, LLE weakness, impaired dynamic balance.   Education / Equipment: HEP   Plan: Patient agrees to discharge.  Patient goals were partially met. Patient is being discharged due to not returning since the last visit.  ?????        Arliss Journey, PT, DPT  12/04/2019, 8:03 AM  [facility] [address] [place], [place], [address] Phone: [phone]   Fax:  [phone]

## 2019-12-21 ENCOUNTER — Ambulatory Visit: Payer: Medicare Other | Attending: Internal Medicine

## 2019-12-21 DIAGNOSIS — Z23 Encounter for immunization: Secondary | ICD-10-CM | POA: Insufficient documentation

## 2019-12-21 NOTE — Progress Notes (Signed)
   Covid-19 Vaccination Clinic  Name:  Diane Pruitt    MRN: [id number] DOB: Sep 10, 1951  12/21/2019  [name] was observed post Covid-19 immunization for 15 minutes without incidence. She was provided with Vaccine Information Sheet and instruction to access the V-Safe system.   [name] was instructed to call 911 with any severe reactions post vaccine: Marland Kitchen Difficulty breathing  . Swelling of your face and throat  . A fast heartbeat  . A bad rash all over your body  . Dizziness and weakness    Immunizations Administered    Name Date Dose VIS Date Route   Pfizer COVID-19 Vaccine 12/21/2019 11:30 AM 0.3 mL 10/09/2019 Intramuscular   Manufacturer: [place]   Lot: [id number]   [place]: [id number]

## 2019-12-27 ENCOUNTER — Ambulatory Visit: Payer: Medicare Other

## 2020-01-13 ENCOUNTER — Ambulatory Visit: Payer: Medicare Other | Attending: Internal Medicine

## 2020-01-13 DIAGNOSIS — Z23 Encounter for immunization: Secondary | ICD-10-CM

## 2020-01-13 NOTE — Progress Notes (Signed)
   Covid-19 Vaccination Clinic  Name:  Diane Pruitt    MRN: [id number] DOB: [birth year]  01/13/2020  Diane Pruitt was observed post Covid-19 immunization for 15 minutes without incident. She was provided with Vaccine Information Sheet and instruction to access the V-Safe system.   Diane Pruitt was instructed to call 911 with any severe reactions post vaccine: Marland Kitchen Difficulty breathing  . Swelling of face and throat  . A fast heartbeat  . A bad rash all over body  . Dizziness and weakness   Immunizations Administered    Name Date Dose VIS Date Route   Pfizer COVID-19 Vaccine 01/13/2020  1:10 PM 0.3 mL 10/09/2019 Intramuscular   Manufacturer: [place]   Lot: [id number]   [place]: [id number]

## 2020-01-18 DIAGNOSIS — M25551 Pain in right hip: Secondary | ICD-10-CM | POA: Insufficient documentation

## 2020-01-18 DIAGNOSIS — M545 Low back pain, unspecified: Secondary | ICD-10-CM | POA: Insufficient documentation

## 2020-02-01 DIAGNOSIS — S32020A Wedge compression fracture of second lumbar vertebra, initial encounter for closed fracture: Secondary | ICD-10-CM | POA: Insufficient documentation

## 2020-02-08 ENCOUNTER — Ambulatory Visit: Payer: Self-pay | Admitting: Orthopedic Surgery

## 2020-02-12 ENCOUNTER — Ambulatory Visit: Payer: Self-pay | Admitting: Orthopedic Surgery

## 2020-02-12 DIAGNOSIS — E7800 Pure hypercholesterolemia, unspecified: Secondary | ICD-10-CM | POA: Insufficient documentation

## 2020-02-12 DIAGNOSIS — N1831 Chronic kidney disease, stage 3a: Secondary | ICD-10-CM | POA: Insufficient documentation

## 2020-02-12 DIAGNOSIS — E782 Mixed hyperlipidemia: Secondary | ICD-10-CM | POA: Insufficient documentation

## 2020-02-12 DIAGNOSIS — E119 Type 2 diabetes mellitus without complications: Secondary | ICD-10-CM | POA: Insufficient documentation

## 2020-02-12 DIAGNOSIS — E039 Hypothyroidism, unspecified: Secondary | ICD-10-CM | POA: Insufficient documentation

## 2020-02-12 NOTE — Progress Notes (Signed)
[facility] [place], [place] AT [address] [place] [address] Phone: [phone] Fax: [phone]      Your procedure is scheduled on Thursday, February 18, 2020.  Report to [facility] Main Entrance "A" at 8:20 A.M., and check in at the Admitting office.  Call this number if you have problems the morning of surgery:  [phone]  Call [phone] if you have any questions prior to your surgery date Monday-Friday 8am-4pm    Remember:  Do not eat or drink after midnight the night before your surgery     Take these medicines the morning of surgery with A SIP OF WATER:  allopurinol (ZYLOPRIM) amiodarone (PACERONE) citalopram (CELEXA) levothyroxine (SYNTHROID) metoprolol tartrate (LOPRESSOR)  As of today, STOP taking any Aspirin (unless otherwise instructed by your surgeon) and Aspirin containing products, Aleve, Naproxen, Ibuprofen, Motrin, Advil, Goody's, BC's, all herbal medications, fish oil, and all vitamins.                      Do not wear jewelry, make up, or nail polish            Do not wear lotions, powders, perfumes, or deodorant.            Do not shave 48 hours prior to surgery.            Do not bring valuables to the hospital.            [facility] is not responsible for any belongings or valuables.  Do NOT Smoke (Tobacco/Vapping) or drink Alcohol 24 hours prior to your procedure If you use a CPAP at night, you may bring all equipment for your overnight stay.   Contacts, glasses, dentures or bridgework may not be worn into surgery.      For patients admitted to the hospital, discharge time will be determined by your treatment team.   Patients discharged the day of surgery will not be allowed to drive home, and someone needs to stay with them for 24 hours.    Special instructions:   [facility]- Preparing For Surgery  Before surgery, you can play an important role. Because  skin is not sterile, your skin needs to be as free of germs as possible. You can reduce the number of germs on your skin by washing with CHG (chlorahexidine gluconate) Soap before surgery.  CHG is an antiseptic cleaner which kills germs and bonds with the skin to continue killing germs even after washing.    Oral Hygiene is also important to reduce your risk of infection.  Remember - BRUSH YOUR TEETH THE MORNING OF SURGERY WITH YOUR REGULAR TOOTHPASTE  Please do not use if you have an allergy to CHG or antibacterial soaps. If your skin becomes reddened/irritated stop using the CHG.  Do not shave (including legs and underarms) for at least 48 hours prior to first CHG shower. It is OK to shave your face.  Please follow these instructions carefully.   1. Shower the NIGHT BEFORE SURGERY and the MORNING OF SURGERY with CHG Soap.   2. If you chose to wash your hair, wash your hair first as usual with your normal shampoo.  3. After you shampoo, rinse your hair and body thoroughly to remove the shampoo.  4. Use CHG as you would any other liquid soap. You can apply CHG directly to the skin and wash gently with  a scrungie or a clean washcloth.   5. Apply the CHG Soap to your body ONLY FROM THE NECK DOWN.  Do not use on open wounds or open sores. Avoid contact with your eyes, ears, mouth and genitals (private parts). Wash Face and genitals (private parts)  with your normal soap.   6. Wash thoroughly, paying special attention to the area where your surgery will be performed.  7. Thoroughly rinse your body with warm water from the neck down.  8. DO NOT shower/wash with your normal soap after using and rinsing off the CHG Soap.  9. Pat yourself dry with a CLEAN TOWEL.  10. Wear CLEAN PAJAMAS to bed the night before surgery, wear comfortable clothes the morning of surgery  11. Place CLEAN SHEETS on your bed the night of your first shower and DO NOT SLEEP WITH PETS.   Day of Surgery:   Do not apply  any deodorants/lotions.  Please wear clean clothes to the hospital/surgery center.   Remember to brush your teeth WITH YOUR REGULAR TOOTHPASTE.   Please read over the following fact sheets that you were given.
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
## 2020-02-15 ENCOUNTER — Encounter (HOSPITAL_COMMUNITY): Payer: Self-pay

## 2020-02-15 ENCOUNTER — Other Ambulatory Visit: Payer: Self-pay

## 2020-02-15 ENCOUNTER — Encounter (HOSPITAL_COMMUNITY)
Admission: RE | Admit: 2020-02-15 | Discharge: 2020-02-15 | Disposition: A | Discharge status: Home / Self Care | Payer: Medicare Other | Source: Ambulatory Visit | Attending: Orthopedic Surgery | Admitting: Orthopedic Surgery

## 2020-02-15 ENCOUNTER — Other Ambulatory Visit (HOSPITAL_COMMUNITY)
Admission: RE | Admit: 2020-02-15 | Discharge: 2020-02-15 | Disposition: A | Discharge status: Home / Self Care | Payer: Medicare Other | Source: Ambulatory Visit | Attending: Orthopedic Surgery | Admitting: Orthopedic Surgery

## 2020-02-15 ENCOUNTER — Ambulatory Visit (HOSPITAL_COMMUNITY)
Admission: RE | Admit: 2020-02-15 | Discharge: 2020-02-15 | Disposition: A | Discharge status: Home / Self Care | Payer: Medicare Other | Source: Ambulatory Visit | Attending: Orthopedic Surgery | Admitting: Orthopedic Surgery

## 2020-02-15 DIAGNOSIS — Z01818 Encounter for other preprocedural examination: Secondary | ICD-10-CM | POA: Diagnosis not present

## 2020-02-15 DIAGNOSIS — I4891 Unspecified atrial fibrillation: Secondary | ICD-10-CM | POA: Diagnosis not present

## 2020-02-15 DIAGNOSIS — Z20822 Contact with and (suspected) exposure to covid-19: Secondary | ICD-10-CM | POA: Insufficient documentation

## 2020-02-15 DIAGNOSIS — Z87891 Personal history of nicotine dependence: Secondary | ICD-10-CM | POA: Diagnosis not present

## 2020-02-15 DIAGNOSIS — I700 Atherosclerosis of aorta: Secondary | ICD-10-CM | POA: Insufficient documentation

## 2020-02-15 HISTORY — DX: Cardiac arrhythmia, unspecified: I49.9

## 2020-02-15 LAB — SURGICAL PCR SCREEN
MRSA, PCR: NEGATIVE
Staphylococcus aureus: NEGATIVE

## 2020-02-15 LAB — BASIC METABOLIC PANEL
Anion gap: 11 (ref 5–15)
BUN: 14 mg/dL (ref 8–23)
CO2: 26 mmol/L (ref 22–32)
Calcium: 8.8 mg/dL — ABNORMAL LOW (ref 8.9–10.3)
Chloride: 102 mmol/L (ref 98–111)
Creatinine, Ser: 0.92 mg/dL (ref 0.44–1.00)
GFR calc Af Amer: >60 mL/min (ref >60)
GFR calc non Af Amer: >60 mL/min (ref >60)
Glucose, Bld: 98 mg/dL (ref 70–99)
Potassium: 4 mmol/L (ref 3.5–5.1)
Sodium: 139 mmol/L (ref 135–145)

## 2020-02-15 LAB — CBC
HCT: 38.6 % (ref 36.0–46.0)
Hemoglobin: 12.6 g/dL (ref 12.0–15.0)
MCH: 33.4 pg (ref 26.0–34.0)
MCHC: 32.6 g/dL (ref 30.0–36.0)
MCV: 102.4 fL — ABNORMAL HIGH (ref 80.0–100.0)
Platelets: 236 10*3/uL (ref 150–400)
RBC: 3.77 MIL/uL — ABNORMAL LOW (ref 3.87–5.11)
RDW: 14 % (ref 11.5–15.5)
WBC: 8.6 10*3/uL (ref 4.0–10.5)
nRBC: 0 % (ref 0.0–0.2)

## 2020-02-15 LAB — URINALYSIS, COMPLETE (UACMP) WITH MICROSCOPIC
Bacteria, UA: NONE SEEN
Bilirubin Urine: NEGATIVE
Glucose, UA: NEGATIVE mg/dL
Hgb urine dipstick: NEGATIVE
Ketones, ur: NEGATIVE mg/dL
Leukocytes,Ua: NEGATIVE
Nitrite: NEGATIVE
Protein, ur: NEGATIVE mg/dL
Specific Gravity, Urine: 1.014 (ref 1.005–1.030)
pH: 6 (ref 5.0–8.0)

## 2020-02-15 LAB — SARS CORONAVIRUS 2 (TAT 6-24 HRS): SARS Coronavirus 2: NEGATIVE

## 2020-02-15 LAB — PROTIME-INR
INR: 1.1 (ref 0.8–1.2)
Prothrombin Time: 14.4 seconds (ref 11.4–15.2)

## 2020-02-15 LAB — APTT: aPTT: 29 seconds (ref 24–36)

## 2020-02-15 NOTE — Progress Notes (Signed)
PCP - Dr. [name] Cardiologist - Dr. Charolette Forward  PPM/ICD - Denies  Chest x-ray - 02/15/20 EKG - 02/15/20 Stress Test - 10/28/18 ECHO - 01/26/18 Cardiac Cath - Denies  Sleep Study - Denies  Pt denies being diabetic.  Blood Thinner Instructions: LD of Eliquis: 02/14/20 Aspirin Instructions: N/A  ERAS Protcol - No  COVID TEST- 02/15/20   Coronavirus Screening  Have you experienced the following symptoms:  Cough yes/no: No Fever (>100.45F)  yes/no: No Runny nose yes/no: No Sore throat yes/no: No Difficulty breathing/shortness of breath  yes/no: No  Have you or a family member traveled in the last 14 days and where? yes/no: No   If the patient indicates "YES" to the above questions, their PAT will be rescheduled to limit the exposure to others and, the surgeon will be notified. THE PATIENT WILL NEED TO BE ASYMPTOMATIC FOR 14 DAYS.   If the patient is not experiencing any of these symptoms, the PAT nurse will instruct them to NOT bring anyone with them to their appointment since they may have these symptoms or traveled as well.   Please remind your patients and families that hospital visitation restrictions are in effect and the importance of the restrictions.     Anesthesia review: Yes, per MD order  Patient denies shortness of breath, fever, cough and chest pain at PAT appointment   All instructions explained to the patient, with a verbal understanding of the material. Patient agrees to go over the instructions while at home for a better understanding. Patient also instructed to self quarantine after being tested for COVID-19. The opportunity to ask questions was provided.

## 2020-02-16 NOTE — Anesthesia Preprocedure Evaluation (Addendum)
Anesthesia Evaluation  Patient identified by MRN, date of birth, ID band Patient awake    Reviewed: Allergy & Precautions, NPO status , Patient's Chart, lab work & pertinent test results, reviewed documented beta blocker date and time   Airway Mallampati: III  TM Distance: >3 FB Neck ROM: Full    Dental no notable dental hx. (+) Teeth Intact, Dental Advisory Given   Pulmonary neg pulmonary ROS,    Pulmonary exam normal breath sounds clear to auscultation       Cardiovascular hypertension, Pt. on medications and Pt. on home beta blockers Normal cardiovascular exam+ dysrhythmias Atrial Fibrillation + Valvular Problems/Murmurs (mild MR) MR  Rhythm:Regular Rate:Normal  TTE 01/26/18: - Left ventricle: The cavity size was normal. There was moderate  concentric hypertrophy. Systolic function was normal. The  estimated ejection fraction was in the range of 60% to 65%. The  study is not technically sufficient to allow evaluation of LV  diastolic function.  - Mitral valve: Calcified annulus. Mildly thickened leaflets .  There was mild regurgitation.  - Left atrium: Moderately dilated.  - Right atrium: Moderately dilated.  - Inferior vena cava: The vessel was dilated. The respirophasic  diameter changes were blunted (< 50%), consistent with elevated  central venous pressure.      PAF- on dilt, metoprolol, amio, eliquis (last dose- 4/18)   Neuro/Psych PSYCHIATRIC DISORDERS Depression CVA, No Residual Symptoms    GI/Hepatic negative GI ROS, Neg liver ROS,   Endo/Other  diabetes, Well Controlled, Type 2, Oral Hypoglycemic AgentsHypothyroidism Obesity BMI 36 Last a1c 5.0  Renal/GU negative Renal ROS  negative genitourinary   Musculoskeletal L2 compression fx   Abdominal (+) + obese,   Peds negative pediatric ROS (+)  Hematology negative hematology ROS (+)   Anesthesia Other Findings    Reproductive/Obstetrics negative OB ROS                           Anesthesia Physical Anesthesia Plan  ASA: III  Anesthesia Plan: MAC   Post-op Pain Management:    Induction:   PONV Risk Score and Plan: 2 and Propofol infusion, TIVA and Treatment may vary due to age or medical condition  Airway Management Planned: Natural Airway and Simple Face Mask  Additional Equipment: None  Intra-op Plan:   Post-operative Plan: Extubation in OR  Informed Consent: I have reviewed the patients History and Physical, chart, labs and discussed the procedure including the risks, benefits and alternatives for the proposed anesthesia with the patient or authorized representative who has indicated his/her understanding and acceptance.     Dental advisory given  Plan Discussed with: CRNA  Anesthesia Plan Comments:        Anesthesia Quick Evaluation

## 2020-02-16 NOTE — Progress Notes (Signed)
Anesthesia Chart Review:  Pt follows with cardiology for hx of afib on Eliquis. Cardiac clearance 02/05/20 states pt can hold Eliquis 3d preop. Copy on pt chart.  PCP clearance dated 02/11/20 also on pt chart.   Preop labs reviewed, unremarkable.   EKG 02/15/20: Sinus rhythm with 1st degree A-V block. Rate 62. Septal infarct , age undetermined  CHEST - 2 VIEW 02/15/20:  COMPARISON:  Chest x-ray 02/03/2018.  FINDINGS: Lung volumes are normal. No consolidative airspace disease. No pleural effusions. No pneumothorax. No pulmonary nodule or mass noted. Pulmonary vasculature and the cardiomediastinal silhouette are within normal limits. Atherosclerosis in the thoracic aorta. Severe mitral annular calcifications.  IMPRESSION: 1.  No radiographic evidence of acute cardiopulmonary disease. 2. Aortic atherosclerosis. 3. Severe mitral annular calcifications.  TTE 01/26/18: - Left ventricle: The cavity size was normal. There was moderate  concentric hypertrophy. Systolic function was normal. The  estimated ejection fraction was in the range of 60% to 65%. The  study is not technically sufficient to allow evaluation of LV  diastolic function.  - Mitral valve: Calcified annulus. Mildly thickened leaflets .  There was mild regurgitation.  - Left atrium: Moderately dilated.  - Right atrium: Moderately dilated.  - Inferior vena cava: The vessel was dilated. The respirophasic  diameter changes were blunted (< 50%), consistent with elevated  central venous pressure.   Impressions:   - LVEF 60-65%, moderate LVH, mild MR, moderate biatrial enlargment,  dilated IVC.   Carotid US 01/26/18: Final Interpretation:  Right Carotid: Velocities in the right ICA are consistent with a 1-39%  stenosis.   Left Carotid: Velocities in the left ICA are consistent with a 1-39%  stenosis.   Vertebrals: Bilateral vertebral arteries demonstrate antegrade flow.  Subclavians: Normal flow  hemodynamics were seen in bilateral subclavian        arteries.    Wynonia Musty [facility] Stay [facility]/Anesthesiology Phone [phone] 02/16/2020 4:40 PM

## 2020-02-18 ENCOUNTER — Ambulatory Visit (HOSPITAL_COMMUNITY): Payer: Medicare Other | Admitting: Physician Assistant

## 2020-02-18 ENCOUNTER — Other Ambulatory Visit: Payer: Self-pay

## 2020-02-18 ENCOUNTER — Ambulatory Visit (HOSPITAL_COMMUNITY): Payer: Medicare Other | Admitting: Certified Registered Nurse Anesthetist

## 2020-02-18 ENCOUNTER — Ambulatory Visit (HOSPITAL_COMMUNITY): Payer: Medicare Other

## 2020-02-18 ENCOUNTER — Ambulatory Visit (HOSPITAL_COMMUNITY)
Admission: RE | Admit: 2020-02-18 | Discharge: 2020-02-18 | Disposition: A | Discharge status: Home / Self Care | Payer: Medicare Other | Attending: Orthopedic Surgery | Admitting: Orthopedic Surgery

## 2020-02-18 ENCOUNTER — Encounter (HOSPITAL_COMMUNITY)
Admission: RE | Disposition: A | Discharge status: Home / Self Care | Payer: Self-pay | Source: Home / Self Care | Attending: Orthopedic Surgery

## 2020-02-18 DIAGNOSIS — Z8673 Personal history of transient ischemic attack (TIA), and cerebral infarction without residual deficits: Secondary | ICD-10-CM | POA: Diagnosis not present

## 2020-02-18 DIAGNOSIS — I4891 Unspecified atrial fibrillation: Secondary | ICD-10-CM | POA: Insufficient documentation

## 2020-02-18 DIAGNOSIS — E119 Type 2 diabetes mellitus without complications: Secondary | ICD-10-CM | POA: Insufficient documentation

## 2020-02-18 DIAGNOSIS — I10 Essential (primary) hypertension: Secondary | ICD-10-CM | POA: Insufficient documentation

## 2020-02-18 DIAGNOSIS — Z6836 Body mass index (BMI) 36.0-36.9, adult: Secondary | ICD-10-CM | POA: Insufficient documentation

## 2020-02-18 DIAGNOSIS — E669 Obesity, unspecified: Secondary | ICD-10-CM | POA: Insufficient documentation

## 2020-02-18 DIAGNOSIS — W19XXXA Unspecified fall, initial encounter: Secondary | ICD-10-CM

## 2020-02-18 DIAGNOSIS — S32020A Wedge compression fracture of second lumbar vertebra, initial encounter for closed fracture: Secondary | ICD-10-CM | POA: Diagnosis present

## 2020-02-18 DIAGNOSIS — Z79899 Other long term (current) drug therapy: Secondary | ICD-10-CM | POA: Insufficient documentation

## 2020-02-18 DIAGNOSIS — E039 Hypothyroidism, unspecified: Secondary | ICD-10-CM | POA: Insufficient documentation

## 2020-02-18 DIAGNOSIS — M545 Low back pain: Secondary | ICD-10-CM | POA: Insufficient documentation

## 2020-02-18 DIAGNOSIS — Z419 Encounter for procedure for purposes other than remedying health state, unspecified: Secondary | ICD-10-CM

## 2020-02-18 DIAGNOSIS — Z7901 Long term (current) use of anticoagulants: Secondary | ICD-10-CM | POA: Diagnosis not present

## 2020-02-18 DIAGNOSIS — Z7984 Long term (current) use of oral hypoglycemic drugs: Secondary | ICD-10-CM | POA: Insufficient documentation

## 2020-02-18 DIAGNOSIS — M4856XA Collapsed vertebra, not elsewhere classified, lumbar region, initial encounter for fracture: Secondary | ICD-10-CM | POA: Diagnosis not present

## 2020-02-18 HISTORY — PX: KYPHOPLASTY: SHX5884

## 2020-02-18 SURGERY — KYPHOPLASTY
Anesthesia: Monitor Anesthesia Care

## 2020-02-18 MED ORDER — BUPIVACAINE HCL (PF) 0.25 % IJ SOLN
INTRAMUSCULAR | Status: AC
  Filled 2020-02-18: qty 30

## 2020-02-18 MED ORDER — ONDANSETRON HCL 4 MG/2ML IJ SOLN
INTRAMUSCULAR | Status: AC
  Filled 2020-02-18: qty 4

## 2020-02-18 MED ORDER — ACETAMINOPHEN 500 MG PO TABS
1000.0000 mg | ORAL_TABLET | Freq: Once | ORAL | Status: AC
  Administered 2020-02-18: 1000 mg via ORAL
  Filled 2020-02-18: qty 2

## 2020-02-18 MED ORDER — OXYCODONE HCL 5 MG/5ML PO SOLN: 5.0000 mg | Freq: Once | ORAL | Status: DC | PRN

## 2020-02-18 MED ORDER — MIDAZOLAM HCL 5 MG/5ML IJ SOLN
INTRAMUSCULAR | Status: DC | PRN
  Administered 2020-02-18: 2 mg via INTRAVENOUS

## 2020-02-18 MED ORDER — IOPAMIDOL (ISOVUE-300) INJECTION 61%
INTRAVENOUS | Status: DC | PRN
  Administered 2020-02-18: 50 mL

## 2020-02-18 MED ORDER — PROPOFOL 500 MG/50ML IV EMUL
INTRAVENOUS | Status: DC | PRN
  Administered 2020-02-18: 75 ug/kg/min via INTRAVENOUS

## 2020-02-18 MED ORDER — 0.9 % SODIUM CHLORIDE (POUR BTL) OPTIME
TOPICAL | Status: DC | PRN
  Administered 2020-02-18: 10:00:00 1000 mL

## 2020-02-18 MED ORDER — LACTATED RINGERS IV SOLN: INTRAVENOUS | Status: DC

## 2020-02-18 MED ORDER — ROCURONIUM BROMIDE 10 MG/ML (PF) SYRINGE
PREFILLED_SYRINGE | INTRAVENOUS | Status: AC
  Filled 2020-02-18: qty 20

## 2020-02-18 MED ORDER — EPHEDRINE SULFATE 50 MG/ML IJ SOLN
INTRAMUSCULAR | Status: DC | PRN
  Administered 2020-02-18: 5 mg via INTRAVENOUS

## 2020-02-18 MED ORDER — KETOROLAC TROMETHAMINE 30 MG/ML IJ SOLN: 15.0000 mg | Freq: Once | INTRAMUSCULAR | Status: DC | PRN

## 2020-02-18 MED ORDER — ONDANSETRON HCL 4 MG/2ML IJ SOLN
INTRAMUSCULAR | Status: DC | PRN
  Administered 2020-02-18: 4 mg via INTRAVENOUS

## 2020-02-18 MED ORDER — LACTATED RINGERS IV SOLN: INTRAVENOUS | Status: DC | PRN

## 2020-02-18 MED ORDER — PHENYLEPHRINE HCL-NACL 10-0.9 MG/250ML-% IV SOLN
INTRAVENOUS | Status: DC | PRN
  Administered 2020-02-18: 25 ug/min via INTRAVENOUS

## 2020-02-18 MED ORDER — PROMETHAZINE HCL 25 MG/ML IJ SOLN: 6.2500 mg | INTRAMUSCULAR | Status: DC | PRN

## 2020-02-18 MED ORDER — MIDAZOLAM HCL 2 MG/2ML IJ SOLN
INTRAMUSCULAR | Status: AC
  Filled 2020-02-18: qty 2

## 2020-02-18 MED ORDER — EPINEPHRINE PF 1 MG/ML IJ SOLN
INTRAMUSCULAR | Status: AC
  Filled 2020-02-18: qty 1

## 2020-02-18 MED ORDER — BUPIVACAINE-EPINEPHRINE 0.25% -1:200000 IJ SOLN
INTRAMUSCULAR | Status: DC | PRN
  Administered 2020-02-18: 10 mL
  Administered 2020-02-18: 20 mL

## 2020-02-18 MED ORDER — HYDROMORPHONE HCL 1 MG/ML IJ SOLN: 0.2500 mg | INTRAMUSCULAR | Status: DC | PRN

## 2020-02-18 MED ORDER — CEFAZOLIN SODIUM-DEXTROSE 2-4 GM/100ML-% IV SOLN
INTRAVENOUS | Status: AC
  Filled 2020-02-18: qty 100

## 2020-02-18 MED ORDER — OXYCODONE HCL 5 MG PO TABS: 5.0000 mg | ORAL_TABLET | Freq: Once | ORAL | Status: DC | PRN

## 2020-02-18 MED ORDER — PROPOFOL 10 MG/ML IV BOLUS
INTRAVENOUS | Status: AC
  Filled 2020-02-18: qty 20

## 2020-02-18 MED ORDER — ONDANSETRON HCL 4 MG PO TABS: 4.0000 mg | ORAL_TABLET | Freq: Three times a day (TID) | ORAL | 0 refills | Status: AC | PRN

## 2020-02-18 MED ORDER — PHENYLEPHRINE 40 MCG/ML (10ML) SYRINGE FOR IV PUSH (FOR BLOOD PRESSURE SUPPORT)
PREFILLED_SYRINGE | INTRAVENOUS | Status: AC
  Filled 2020-02-18: qty 10

## 2020-02-18 MED ORDER — LIDOCAINE 2% (20 MG/ML) 5 ML SYRINGE
INTRAMUSCULAR | Status: AC
  Filled 2020-02-18: qty 10

## 2020-02-18 MED ORDER — BUPIVACAINE LIPOSOME 1.3 % IJ SUSP
INTRAMUSCULAR | Status: DC | PRN
  Administered 2020-02-18: 20 mL

## 2020-02-18 MED ORDER — DEXAMETHASONE SODIUM PHOSPHATE 10 MG/ML IJ SOLN
INTRAMUSCULAR | Status: AC
  Filled 2020-02-18: qty 2

## 2020-02-18 MED ORDER — SODIUM CHLORIDE 0.9% FLUSH: 3.0000 mL | Freq: Two times a day (BID) | INTRAVENOUS | Status: DC

## 2020-02-18 MED ORDER — FENTANYL CITRATE (PF) 250 MCG/5ML IJ SOLN
INTRAMUSCULAR | Status: DC | PRN
  Administered 2020-02-18: 25 ug via INTRAVENOUS

## 2020-02-18 MED ORDER — CEFAZOLIN SODIUM-DEXTROSE 2-4 GM/100ML-% IV SOLN
2.0000 g | INTRAVENOUS | Status: AC
  Administered 2020-02-18: 2 g via INTRAVENOUS

## 2020-02-18 MED ORDER — TRAMADOL HCL 50 MG PO TABS: 50.0000 mg | ORAL_TABLET | Freq: Four times a day (QID) | ORAL | 0 refills | Status: AC | PRN

## 2020-02-18 MED ORDER — FENTANYL CITRATE (PF) 250 MCG/5ML IJ SOLN
INTRAMUSCULAR | Status: AC
  Filled 2020-02-18: qty 5

## 2020-02-18 SURGICAL SUPPLY — 48 items
ADH SKN CLS APL DERMABOND .7 (GAUZE/BANDAGES/DRESSINGS) ×1
ADH SKN CLS LQ APL DERMABOND (GAUZE/BANDAGES/DRESSINGS) ×1
BLADE SURG 15 STRL LF DISP TIS (BLADE) ×1 IMPLANT
BLADE SURG 15 STRL SS (BLADE) ×2
BNDG ADH 1X3 SHEER STRL LF (GAUZE/BANDAGES/DRESSINGS) ×4 IMPLANT
BNDG ADH THN 3X1 STRL LF (GAUZE/BANDAGES/DRESSINGS) ×2
CEMENT KYPHON CX01A KIT/MIXER (Cement) ×2 IMPLANT
COVER MAYO STAND STRL (DRAPES) ×1 IMPLANT
COVER SURGICAL LIGHT HANDLE (MISCELLANEOUS) ×2 IMPLANT
COVER WAND RF STERILE (DRAPES) ×2 IMPLANT
CURETTE EXPRESS SZ2 7MM (INSTRUMENTS) IMPLANT
CURETTE WEDGE 8.5MM KYPHX (MISCELLANEOUS) IMPLANT
CURRETTE EXPRESS SZ2 7MM (INSTRUMENTS) ×2
DERMABOND ADHESIVE PROPEN (GAUZE/BANDAGES/DRESSINGS) ×1
DERMABOND ADVANCED (GAUZE/BANDAGES/DRESSINGS) ×1
DERMABOND ADVANCED .7 DNX12 (GAUZE/BANDAGES/DRESSINGS) ×1 IMPLANT
DERMABOND ADVANCED .7 DNX6 (GAUZE/BANDAGES/DRESSINGS) IMPLANT
DRAPE C-ARM 42X72 X-RAY (DRAPES) ×4 IMPLANT
DRAPE C-ARMOR (DRAPES) IMPLANT
DRAPE INCISE IOBAN 66X45 STRL (DRAPES) ×2 IMPLANT
DRAPE LAPAROTOMY T 102X78X121 (DRAPES) ×2 IMPLANT
DRAPE WARM FLUID 44X44 (DRAPES) ×2 IMPLANT
DURAPREP 26ML APPLICATOR (WOUND CARE) ×2 IMPLANT
GLOVE BIO SURGEON STRL SZ 6.5 (GLOVE) ×2 IMPLANT
GLOVE BIOGEL PI IND STRL 6.5 (GLOVE) ×1 IMPLANT
GLOVE BIOGEL PI IND STRL 8.5 (GLOVE) IMPLANT
GLOVE BIOGEL PI INDICATOR 6.5 (GLOVE) ×1
GLOVE BIOGEL PI INDICATOR 8.5 (GLOVE)
GLOVE SS BIOGEL STRL SZ 8.5 (GLOVE) ×1 IMPLANT
GLOVE SUPERSENSE BIOGEL SZ 8.5 (GLOVE) ×1
GOWN STRL REUS W/ TWL LRG LVL3 (GOWN DISPOSABLE) ×2 IMPLANT
GOWN STRL REUS W/TWL 2XL LVL3 (GOWN DISPOSABLE) ×2 IMPLANT
GOWN STRL REUS W/TWL LRG LVL3 (GOWN DISPOSABLE) ×4
KIT BASIN OR (CUSTOM PROCEDURE TRAY) ×2 IMPLANT
KIT TURNOVER KIT B (KITS) ×2 IMPLANT
NDL SPNL 22GX3.5 QUINCKE BK (NEEDLE) ×1 IMPLANT
NEEDLE HYPO 22GX1.5 SAFETY (NEEDLE) ×2 IMPLANT
NEEDLE SPNL 22GX3.5 QUINCKE BK (NEEDLE) ×2 IMPLANT
NS IRRIG 1000ML POUR BTL (IV SOLUTION) ×4 IMPLANT
PACK SURGICAL SETUP 50X90 (CUSTOM PROCEDURE TRAY) ×2 IMPLANT
PAD ARMBOARD 7.5X6 YLW CONV (MISCELLANEOUS) ×4 IMPLANT
SPONGE LAP 4X18 RFD (DISPOSABLE) ×2 IMPLANT
SUT MNCRL AB 3-0 PS2 18 (SUTURE) ×2 IMPLANT
SYR CONTROL 10ML LL (SYRINGE) ×2 IMPLANT
TOWEL GREEN STERILE (TOWEL DISPOSABLE) ×2 IMPLANT
TRAY KYPHOPAK 15/3 ONESTEP 1ST (MISCELLANEOUS) IMPLANT
TRAY KYPHOPAK 20/3 ONESTEP 1ST (MISCELLANEOUS) ×1 IMPLANT
WATER STERILE IRR 1000ML POUR (IV SOLUTION) ×2 IMPLANT

## 2020-02-18 NOTE — Progress Notes (Signed)
   02/18/20 0800  What Happened  Was fall witnessed? Yes  Who witnessed fall? Hosie Spangle, secretary  Patients activity before fall ambulating-unassisted (ambulating from lobby to surgical short stay)  Point of contact other (comment) (knee and wrist)  Follow Up  MD notified Dr. Rolena Infante  Time MD notified [phone]  Family notified No - patient refusal  Additional tests Yes-comment  Simple treatment Ice  Oxygen Therapy  SpO2 100 %  O2 Device Room Air  Pain Assessment  Pain Scale 0-10  Pain Score 0  Neurological  Neuro (WDL) WDL  Level of Consciousness Alert  Orientation Level Oriented X4

## 2020-02-18 NOTE — Op Note (Signed)
Operative report  Preoperative diagnosis: L2 osteoporotic compression fracture  Postoperative diagnosis: Same  Operative procedure: L2 kyphoplasty  Complications: None  Indications: Diane Pruitt is a very pleasant 69 year old man who presented to my office with significant upper lumbar pain.  Imaging studies confirmed an L2 compression fracture.  As result of the severe pain and failure to improve with conservative measures she elected to move forward with L2 kyphoplasty.  All appropriate risks benefits and alternatives were discussed with the patient and consent was obtained.  Operative report: Patient was brought the operating room placed prone on the operative table.  Once the patient was comfortable IV sedation was provided and the back was prepped and draped in a standard fashion.  A timeout was taken to confirm patient procedure and all other important data.  Using AP fluoroscopy identified the lateral borders of the L2 pedicle and marked amount on the skin.  I infiltrated the area with quarter percent Marcaine with epinephrine.  I then used a long spinal needle and then began anesthetizing the periosteum on the lateral aspect of the facet joint and transverse process.  This was done with quarter percent Marcaine with Exparel.  Once had adequate local sedation I proceeded with the surgery.  Small incision was made and the Jamshidi needle was advanced to the lateral aspect of the L 2 pedicle.  I then advanced the Jamshidi needle into the L2 pedicle.  I used the AP and lateral fluoroscopy to confirm position and trajectory.  I confirmed that as I was nearing the medial wall of the pedicle I was just beyond the posterior wall of the vertebral body on the lateral view.  Once this was confirmed I advanced into the vertebral body.  Once both cannulas were placed I then used the drill and sounded the bony canal to ensure it was solid.  I then used a curette to prep the area and then inserted the inflatable  bone tamps.  I then inflated the bone tamps in order to create my a void.  A single inflatable bone tamp was removed and I inserted the cement.  I then deflated the contralateral balloon and inserted the cement.  I again monitored cement insertion with AP and lateral fluoroscopy.  Once had the appropriate amount of cement which on the left side was approximately 2 cc and on the right side 1.5 cc I allowed the cement to harden.  Once the cement was hardened I remove the Jamshidi needle and took my final x-rays.  I had satisfactory filling of the vertebral body with the cement mantle.  There is no significant leak posterior inferior anterior or laterally.  There is a small amount of cement that had leak superiorly but not significant.  The wounds were cleaned and closed with 3-0 Monocryl simple stitch.  Dermabond and Band-Aids were applied and the patient was transferred to the PACU without incident.  The end of the case all needle sponge counts were correct.  There were no adverse intraoperative events.

## 2020-02-18 NOTE — Progress Notes (Signed)
   02/18/20 0836  Adult Fall Risk Assessment  Risk Factor Category (scoring not indicated) Not Applicable  Age 69  Fall History: Fall within 6 months prior to admission 5  Elimination; Bowel and/or Urine Incontinence 0  Elimination; Bowel and/or Urine Urgency/Frequency 0  Medications: includes PCA/Opiates, Anti-convulsants, Anti-hypertensives, Diuretics, Hypnotics, Laxatives, Sedatives, and Psychotropics 3  Patient Care Equipment 0  Mobility-Assistance 0  Mobility-Gait 0  Mobility-Sensory Deficit 0  Altered awareness of immediate physical environment 0  Impulsiveness 0  Lack of understanding of one's physical/cognitive limitations 0  Total Score 9  Adult Fall Risk Interventions  Required Bundle Interventions *See Row Information* Moderate fall risk - low and moderate requirements implemented  Oxygen Therapy  O2 Device Room Air  Pain Assessment  Pain Scale 0-10  Pain Score 0  Neurological  Neuro (WDL) WDL  Level of Consciousness Alert  Orientation Level Oriented X4  Integumentary  Integumentary (WDL) WDL

## 2020-02-18 NOTE — Progress Notes (Signed)
Patient was ambulating from lobby to short stay with secretary without difficulty.  Patient reported that she tripped over shoe and fell.  Patient states that she did not hit her head but that she did land on her right knee and right wrist.   Patient assisted to wheelchair by 2 nurses.  Patient taken to short stay bay and assisted to bed by nurse.   Nurse tech helped patient change from clothes to gown and obtained vitals.  Patient reports swelling to right wrist, denies pain to knee but has bruise on right knee.  Dr. Rolena Infante notified of the fall and gave verbal order for portable xray of right wrist and right knee.  Ice was placed on patient's right wrist.   Post fall flowsheet in process.

## 2020-02-18 NOTE — Anesthesia Postprocedure Evaluation (Signed)
Anesthesia Post Note  Patient: Diane Pruitt  Procedure(s) Performed: KYPHOPLASTY L2 (N/A )     Patient location during evaluation: PACU Anesthesia Type: MAC Level of consciousness: awake and alert Pain management: pain level controlled Vital Signs Assessment: post-procedure vital signs reviewed and stable Respiratory status: spontaneous breathing, nonlabored ventilation and respiratory function stable Cardiovascular status: blood pressure returned to baseline and stable Postop Assessment: no apparent nausea or vomiting Anesthetic complications: no    Last Vitals:  Vitals:   02/18/20 1115 02/18/20 1123  BP: (!) 104/50 (!) 116/59  Pulse: (!) 48 (!) 49  Resp: 10 16  Temp:  36.6 C  SpO2: 99% 99%    Last Pain:  Vitals:   02/18/20 1123  TempSrc:   PainSc: 0-No pain                 Pervis Hocking

## 2020-02-18 NOTE — Progress Notes (Signed)
Patient assessed and A & O x 4. RAFA has slight bruising/tender and swelling ice applied.  R & L knee has some redness but no swelling or tenderness per patient. Patient stated no need to make family aware of fall.

## 2020-02-18 NOTE — H&P (Signed)
Addendum H&P: There has been no change in her clinical exam since her last office visit of 02/12/2020.  The patient did unfortunately suffer a fall this morning in the hospital.  She initially complained of some right wrist and knee pain.  Imaging studies demonstrated no fracture and her pain has subsided.  On exam she has excellent range of motion of the upper and lower extremity with no significant pain.  She continues to have significant low back pain with no focal neurological deficits.  Plan is to move forward with the L2 kyphoplasty.  All appropriate risks benefits and alternatives to surgery were discussed with the patient and consent was obtained.

## 2020-02-18 NOTE — Transfer of Care (Signed)
Immediate Anesthesia Transfer of Care Note  Patient: Diane Pruitt  Procedure(s) Performed: KYPHOPLASTY L2 (N/A )  Patient Location: PACU  Anesthesia Type:MAC  Level of Consciousness: awake, alert , oriented, patient cooperative and responds to stimulation  Airway & Oxygen Therapy: Patient Spontanous Breathing  Post-op Assessment: Report given to RN, Post -op Vital signs reviewed and stable and Patient moving all extremities X 4  Post vital signs: Reviewed and stable  Last Vitals:  Vitals Value Taken Time  BP    Temp    Pulse 49 02/18/20 1056  Resp 20 02/18/20 1056  SpO2 95 % 02/18/20 1056  Vitals shown include unvalidated device data.  Last Pain:  Vitals:   02/18/20 0836  TempSrc:   PainSc: 0-No pain         Complications: No apparent anesthesia complications

## 2020-02-18 NOTE — Brief Op Note (Signed)
02/18/2020  11:06 AM  PATIENT:  Diane Pruitt  69 y.o. female  PRE-OPERATIVE DIAGNOSIS:  L2 Compression Fracture  POST-OPERATIVE DIAGNOSIS:  L2 Compression Fracture  PROCEDURE:  Procedure(s) with comments: KYPHOPLASTY L2 (N/A) - 60 mins  SURGEON:  Surgeon(s) and Role:    Melina Schools, MD - Primary  PHYSICIAN ASSISTANT:   ASSISTANTS: none   ANESTHESIA:   local and IV sedation  EBL:  none   BLOOD ADMINISTERED:none  DRAINS: none   LOCAL MEDICATIONS USED:  Marcaine with exaperl  SPECIMEN:  No Specimen  DISPOSITION OF SPECIMEN:  N/A  COUNTS:  YES  TOURNIQUET:  * No tourniquets in log *  DICTATION: .Dragon Dictation  PLAN OF CARE: Discharge to home after PACU  PATIENT DISPOSITION:  PACU - hemodynamically stable.

## 2020-10-02 IMAGING — RF DG C-ARM 1-60 MIN
2 series · 2 of 2 positions shown · non-contrast
Comparison: None.

CLINICAL DATA: Surgery, kyphoplasty.

EXAM:
THORACOLUMBAR SPINE 1V

[Series 1: run · 1 of 1 slices shown (1 of 2)]
[im 1/1]
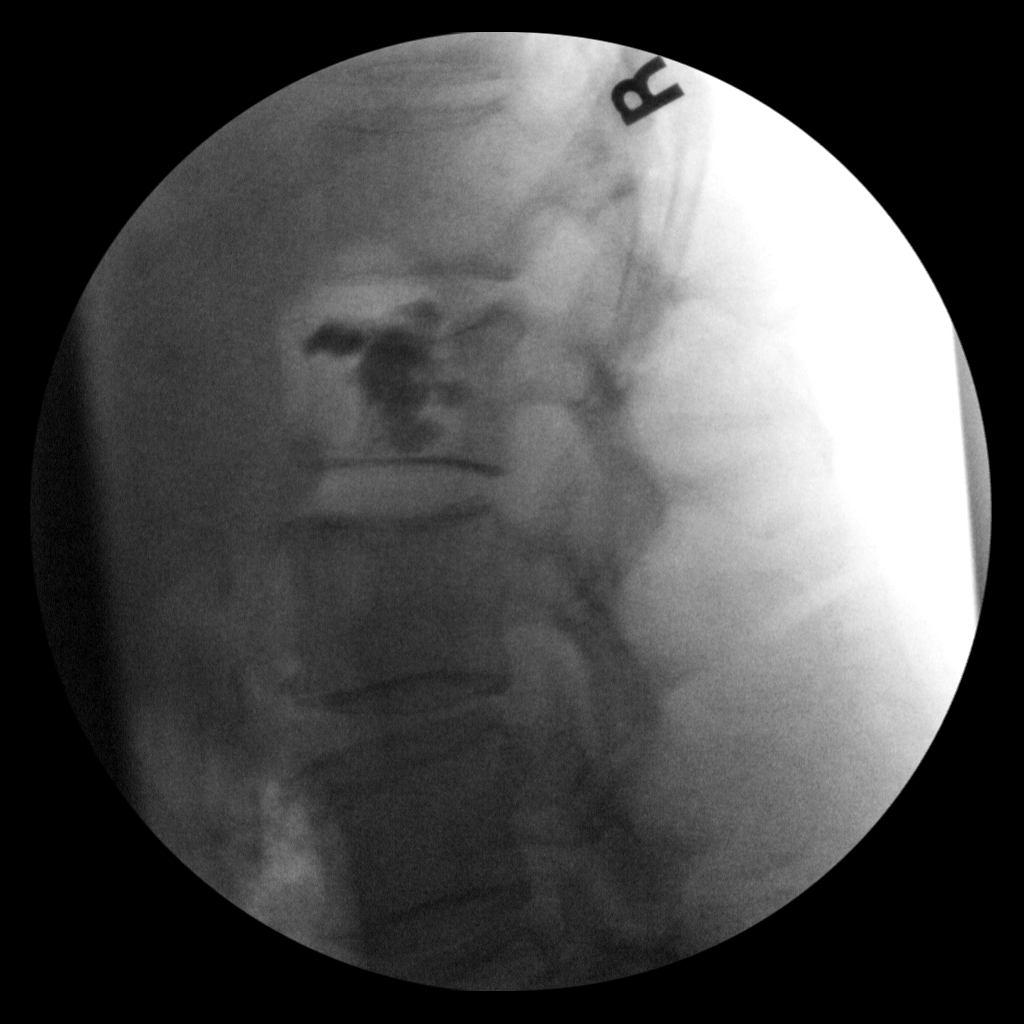

[Series 1: run · 1 of 1 slices shown (2 of 2)]
[im 1/1]
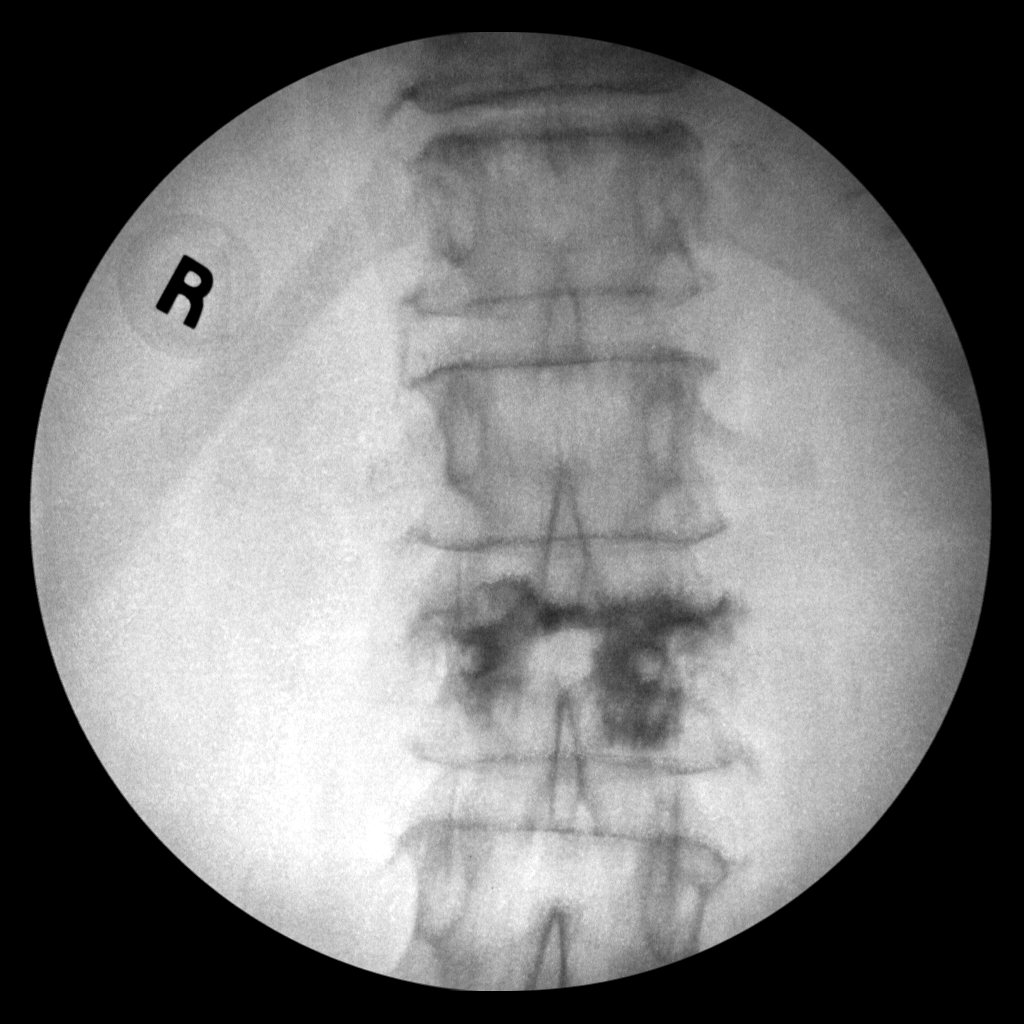

[2 of 2 positions shown; findings below may reference images not displayed]

FINDINGS: Intraoperative fluoroscopic images of the thoracolumbar spine are
provided demonstrating changes of kyphoplasty at the L2 vertebral
body level. Fluoroscopy provided for 3 minutes and 10 seconds.
IMPRESSION: Intraoperative fluoroscopic images demonstrating expected changes of
kyphoplasty at the L2 vertebral body level.

## 2020-10-20 ENCOUNTER — Other Ambulatory Visit: Payer: Self-pay | Admitting: Family Medicine

## 2020-10-20 DIAGNOSIS — R7989 Other specified abnormal findings of blood chemistry: Secondary | ICD-10-CM

## 2020-11-16 ENCOUNTER — Ambulatory Visit
Admission: RE | Admit: 2020-11-16 | Discharge: 2020-11-16 | Disposition: A | Discharge status: Home / Self Care | Payer: Medicare Other | Source: Ambulatory Visit | Attending: Family Medicine | Admitting: Family Medicine

## 2020-11-16 DIAGNOSIS — R7989 Other specified abnormal findings of blood chemistry: Secondary | ICD-10-CM

## 2021-01-16 DIAGNOSIS — E6609 Other obesity due to excess calories: Secondary | ICD-10-CM | POA: Diagnosis not present

## 2021-01-16 DIAGNOSIS — E039 Hypothyroidism, unspecified: Secondary | ICD-10-CM | POA: Diagnosis not present

## 2021-02-14 DIAGNOSIS — M109 Gout, unspecified: Secondary | ICD-10-CM | POA: Diagnosis not present

## 2021-02-14 DIAGNOSIS — I480 Paroxysmal atrial fibrillation: Secondary | ICD-10-CM | POA: Diagnosis not present

## 2021-02-14 DIAGNOSIS — I639 Cerebral infarction, unspecified: Secondary | ICD-10-CM | POA: Diagnosis not present

## 2021-02-14 DIAGNOSIS — I25118 Atherosclerotic heart disease of native coronary artery with other forms of angina pectoris: Secondary | ICD-10-CM | POA: Diagnosis not present

## 2021-02-16 DIAGNOSIS — H26493 Other secondary cataract, bilateral: Secondary | ICD-10-CM | POA: Diagnosis not present

## 2021-02-16 DIAGNOSIS — H5340 Unspecified visual field defects: Secondary | ICD-10-CM | POA: Diagnosis not present

## 2021-02-21 DIAGNOSIS — C4441 Basal cell carcinoma of skin of scalp and neck: Secondary | ICD-10-CM | POA: Diagnosis not present

## 2021-02-21 DIAGNOSIS — D485 Neoplasm of uncertain behavior of skin: Secondary | ICD-10-CM | POA: Diagnosis not present

## 2021-03-06 DIAGNOSIS — H26491 Other secondary cataract, right eye: Secondary | ICD-10-CM | POA: Diagnosis not present

## 2021-03-13 DIAGNOSIS — H26492 Other secondary cataract, left eye: Secondary | ICD-10-CM | POA: Diagnosis not present

## 2021-04-20 DIAGNOSIS — M810 Age-related osteoporosis without current pathological fracture: Secondary | ICD-10-CM | POA: Diagnosis not present

## 2021-04-20 DIAGNOSIS — E039 Hypothyroidism, unspecified: Secondary | ICD-10-CM | POA: Diagnosis not present

## 2021-04-20 DIAGNOSIS — N183 Chronic kidney disease, stage 3 unspecified: Secondary | ICD-10-CM | POA: Diagnosis not present

## 2021-04-20 DIAGNOSIS — R69 Illness, unspecified: Secondary | ICD-10-CM | POA: Diagnosis not present

## 2021-04-20 DIAGNOSIS — E782 Mixed hyperlipidemia: Secondary | ICD-10-CM | POA: Diagnosis not present

## 2021-04-20 DIAGNOSIS — I4891 Unspecified atrial fibrillation: Secondary | ICD-10-CM | POA: Diagnosis not present

## 2021-04-20 DIAGNOSIS — I129 Hypertensive chronic kidney disease with stage 1 through stage 4 chronic kidney disease, or unspecified chronic kidney disease: Secondary | ICD-10-CM | POA: Diagnosis not present

## 2021-04-26 DIAGNOSIS — D6869 Other thrombophilia: Secondary | ICD-10-CM | POA: Diagnosis not present

## 2021-04-26 DIAGNOSIS — E039 Hypothyroidism, unspecified: Secondary | ICD-10-CM | POA: Diagnosis not present

## 2021-04-26 DIAGNOSIS — E6609 Other obesity due to excess calories: Secondary | ICD-10-CM | POA: Diagnosis not present

## 2021-04-26 DIAGNOSIS — I129 Hypertensive chronic kidney disease with stage 1 through stage 4 chronic kidney disease, or unspecified chronic kidney disease: Secondary | ICD-10-CM | POA: Diagnosis not present

## 2021-04-26 DIAGNOSIS — K635 Polyp of colon: Secondary | ICD-10-CM | POA: Diagnosis not present

## 2021-04-26 DIAGNOSIS — R69 Illness, unspecified: Secondary | ICD-10-CM | POA: Diagnosis not present

## 2021-04-26 DIAGNOSIS — D649 Anemia, unspecified: Secondary | ICD-10-CM | POA: Diagnosis not present

## 2021-04-26 DIAGNOSIS — M1A09X0 Idiopathic chronic gout, multiple sites, without tophus (tophi): Secondary | ICD-10-CM | POA: Diagnosis not present

## 2021-04-26 DIAGNOSIS — N183 Chronic kidney disease, stage 3 unspecified: Secondary | ICD-10-CM | POA: Diagnosis not present

## 2021-04-26 DIAGNOSIS — E782 Mixed hyperlipidemia: Secondary | ICD-10-CM | POA: Diagnosis not present

## 2021-04-26 DIAGNOSIS — S32020D Wedge compression fracture of second lumbar vertebra, subsequent encounter for fracture with routine healing: Secondary | ICD-10-CM | POA: Diagnosis not present

## 2021-04-27 DIAGNOSIS — C4441 Basal cell carcinoma of skin of scalp and neck: Secondary | ICD-10-CM | POA: Diagnosis not present

## 2021-05-11 DIAGNOSIS — R69 Illness, unspecified: Secondary | ICD-10-CM | POA: Diagnosis not present

## 2021-05-11 DIAGNOSIS — I4891 Unspecified atrial fibrillation: Secondary | ICD-10-CM | POA: Diagnosis not present

## 2021-05-11 DIAGNOSIS — N183 Chronic kidney disease, stage 3 unspecified: Secondary | ICD-10-CM | POA: Diagnosis not present

## 2021-05-11 DIAGNOSIS — E782 Mixed hyperlipidemia: Secondary | ICD-10-CM | POA: Diagnosis not present

## 2021-05-11 DIAGNOSIS — M810 Age-related osteoporosis without current pathological fracture: Secondary | ICD-10-CM | POA: Diagnosis not present

## 2021-05-11 DIAGNOSIS — I129 Hypertensive chronic kidney disease with stage 1 through stage 4 chronic kidney disease, or unspecified chronic kidney disease: Secondary | ICD-10-CM | POA: Diagnosis not present

## 2021-05-11 DIAGNOSIS — E039 Hypothyroidism, unspecified: Secondary | ICD-10-CM | POA: Diagnosis not present

## 2021-05-12 DIAGNOSIS — M109 Gout, unspecified: Secondary | ICD-10-CM | POA: Diagnosis not present

## 2021-05-12 DIAGNOSIS — E039 Hypothyroidism, unspecified: Secondary | ICD-10-CM | POA: Diagnosis not present

## 2021-05-12 DIAGNOSIS — I480 Paroxysmal atrial fibrillation: Secondary | ICD-10-CM | POA: Diagnosis not present

## 2021-05-12 DIAGNOSIS — I10 Essential (primary) hypertension: Secondary | ICD-10-CM | POA: Diagnosis not present

## 2021-05-12 DIAGNOSIS — E785 Hyperlipidemia, unspecified: Secondary | ICD-10-CM | POA: Diagnosis not present

## 2021-05-16 DIAGNOSIS — I480 Paroxysmal atrial fibrillation: Secondary | ICD-10-CM | POA: Diagnosis not present

## 2021-05-16 DIAGNOSIS — E119 Type 2 diabetes mellitus without complications: Secondary | ICD-10-CM | POA: Diagnosis not present

## 2021-05-16 DIAGNOSIS — M109 Gout, unspecified: Secondary | ICD-10-CM | POA: Diagnosis not present

## 2021-05-16 DIAGNOSIS — I25118 Atherosclerotic heart disease of native coronary artery with other forms of angina pectoris: Secondary | ICD-10-CM | POA: Diagnosis not present

## 2021-06-13 DIAGNOSIS — Z86010 Personal history of colonic polyps: Secondary | ICD-10-CM | POA: Diagnosis not present

## 2021-06-13 DIAGNOSIS — K921 Melena: Secondary | ICD-10-CM | POA: Diagnosis not present

## 2021-06-13 DIAGNOSIS — D509 Iron deficiency anemia, unspecified: Secondary | ICD-10-CM | POA: Diagnosis not present

## 2021-06-15 DIAGNOSIS — R195 Other fecal abnormalities: Secondary | ICD-10-CM | POA: Diagnosis not present

## 2021-06-15 DIAGNOSIS — D124 Benign neoplasm of descending colon: Secondary | ICD-10-CM | POA: Diagnosis not present

## 2021-06-15 DIAGNOSIS — D123 Benign neoplasm of transverse colon: Secondary | ICD-10-CM | POA: Diagnosis not present

## 2021-06-15 DIAGNOSIS — K2270 Barrett's esophagus without dysplasia: Secondary | ICD-10-CM | POA: Diagnosis not present

## 2021-06-15 DIAGNOSIS — D122 Benign neoplasm of ascending colon: Secondary | ICD-10-CM | POA: Diagnosis not present

## 2021-06-21 DIAGNOSIS — K2270 Barrett's esophagus without dysplasia: Secondary | ICD-10-CM | POA: Diagnosis not present

## 2021-06-21 DIAGNOSIS — D124 Benign neoplasm of descending colon: Secondary | ICD-10-CM | POA: Diagnosis not present

## 2021-06-21 DIAGNOSIS — D123 Benign neoplasm of transverse colon: Secondary | ICD-10-CM | POA: Diagnosis not present

## 2021-06-21 DIAGNOSIS — D122 Benign neoplasm of ascending colon: Secondary | ICD-10-CM | POA: Diagnosis not present

## 2021-06-23 DIAGNOSIS — E782 Mixed hyperlipidemia: Secondary | ICD-10-CM | POA: Diagnosis not present

## 2021-06-23 DIAGNOSIS — N183 Chronic kidney disease, stage 3 unspecified: Secondary | ICD-10-CM | POA: Diagnosis not present

## 2021-06-23 DIAGNOSIS — I129 Hypertensive chronic kidney disease with stage 1 through stage 4 chronic kidney disease, or unspecified chronic kidney disease: Secondary | ICD-10-CM | POA: Diagnosis not present

## 2021-06-23 DIAGNOSIS — R69 Illness, unspecified: Secondary | ICD-10-CM | POA: Diagnosis not present

## 2021-06-23 DIAGNOSIS — E039 Hypothyroidism, unspecified: Secondary | ICD-10-CM | POA: Diagnosis not present

## 2021-06-23 DIAGNOSIS — I4891 Unspecified atrial fibrillation: Secondary | ICD-10-CM | POA: Diagnosis not present

## 2021-07-05 ENCOUNTER — Ambulatory Visit (INDEPENDENT_AMBULATORY_CARE_PROVIDER_SITE_OTHER): Payer: Medicare HMO

## 2021-07-05 ENCOUNTER — Ambulatory Visit: Payer: Medicare HMO | Admitting: Podiatry

## 2021-07-05 ENCOUNTER — Other Ambulatory Visit: Payer: Self-pay

## 2021-07-05 ENCOUNTER — Encounter: Payer: Self-pay | Admitting: Podiatry

## 2021-07-05 DIAGNOSIS — M2042 Other hammer toe(s) (acquired), left foot: Secondary | ICD-10-CM

## 2021-07-05 DIAGNOSIS — M2041 Other hammer toe(s) (acquired), right foot: Secondary | ICD-10-CM | POA: Diagnosis not present

## 2021-07-05 DIAGNOSIS — M216X9 Other acquired deformities of unspecified foot: Secondary | ICD-10-CM

## 2021-07-05 NOTE — Progress Notes (Signed)
Subjective:   Patient ID: Diane Pruitt, female   DOB: 70 y.o.   MRN: [id number]   HPI Patient presents stating she has severe hammertoe deformity second toe left foot with elevated toe that is rigidly contracted with structural deformity also underlying toes and pain underneath her foot.  States scratching become more of an issue over these couple years and she knows she needs correction but she like to wait until she is no longer working.  Patient does not smoke likes to be active   Review of Systems  All other systems reviewed and are negative.      Objective:  Physical Exam Vitals and nursing note reviewed.  Constitutional:      Appearance: She is well-developed.  Pulmonary:     Effort: Pulmonary effort is normal.  Musculoskeletal:        General: Normal range of motion.  Skin:    General: Skin is warm.  Neurological:     Mental Status: She is alert.    Neurovascular status was found to be intact muscle strength found to be within normal limits with severely rigid elevated second digit left foot that is no longer contacting the surface with discomfort plantarly and slight redness on the top of the toe.  Moderate structural bunion and digital deformity digit 3 left     Assessment:  Severe malalignment digit to left over right foot with rigid contracture of the joint probability for complete flexor plate tear and plantar discomfort with third digit pathology     Plan:  H&P reviewed condition.  I do think we can make an attempt at digital stabilization with shortening osteotomy of digits 2 and 3 left and it may hold or ultimately may require amputation of the second toe.  I reviewed both conditions and discussed and at this point we are going to hold off until she stops work and I think she is can for reconstruction even though today I did go over at great length there is no guarantee that this toe will hold in a satisfactory position  X-rays indicate severe malalignment digit  to left with no holding against the surface of the ground and deformity of the hallux and third digit with right foot showing moderate to

## 2021-07-27 DIAGNOSIS — I129 Hypertensive chronic kidney disease with stage 1 through stage 4 chronic kidney disease, or unspecified chronic kidney disease: Secondary | ICD-10-CM | POA: Diagnosis not present

## 2021-07-27 DIAGNOSIS — M810 Age-related osteoporosis without current pathological fracture: Secondary | ICD-10-CM | POA: Diagnosis not present

## 2021-07-27 DIAGNOSIS — E782 Mixed hyperlipidemia: Secondary | ICD-10-CM | POA: Diagnosis not present

## 2021-07-27 DIAGNOSIS — E039 Hypothyroidism, unspecified: Secondary | ICD-10-CM | POA: Diagnosis not present

## 2021-07-27 DIAGNOSIS — D509 Iron deficiency anemia, unspecified: Secondary | ICD-10-CM | POA: Diagnosis not present

## 2021-07-27 DIAGNOSIS — I4891 Unspecified atrial fibrillation: Secondary | ICD-10-CM | POA: Diagnosis not present

## 2021-07-27 DIAGNOSIS — N183 Chronic kidney disease, stage 3 unspecified: Secondary | ICD-10-CM | POA: Diagnosis not present

## 2021-07-27 DIAGNOSIS — R69 Illness, unspecified: Secondary | ICD-10-CM | POA: Diagnosis not present

## 2021-08-15 DIAGNOSIS — I480 Paroxysmal atrial fibrillation: Secondary | ICD-10-CM | POA: Diagnosis not present

## 2021-08-15 DIAGNOSIS — E785 Hyperlipidemia, unspecified: Secondary | ICD-10-CM | POA: Diagnosis not present

## 2021-08-15 DIAGNOSIS — I25118 Atherosclerotic heart disease of native coronary artery with other forms of angina pectoris: Secondary | ICD-10-CM | POA: Diagnosis not present

## 2021-08-15 DIAGNOSIS — I10 Essential (primary) hypertension: Secondary | ICD-10-CM | POA: Diagnosis not present

## 2021-08-25 DIAGNOSIS — I129 Hypertensive chronic kidney disease with stage 1 through stage 4 chronic kidney disease, or unspecified chronic kidney disease: Secondary | ICD-10-CM | POA: Diagnosis not present

## 2021-08-25 DIAGNOSIS — N183 Chronic kidney disease, stage 3 unspecified: Secondary | ICD-10-CM | POA: Diagnosis not present

## 2021-08-25 DIAGNOSIS — E039 Hypothyroidism, unspecified: Secondary | ICD-10-CM | POA: Diagnosis not present

## 2021-08-25 DIAGNOSIS — D509 Iron deficiency anemia, unspecified: Secondary | ICD-10-CM | POA: Diagnosis not present

## 2021-08-25 DIAGNOSIS — I4891 Unspecified atrial fibrillation: Secondary | ICD-10-CM | POA: Diagnosis not present

## 2021-08-25 DIAGNOSIS — M810 Age-related osteoporosis without current pathological fracture: Secondary | ICD-10-CM | POA: Diagnosis not present

## 2021-08-25 DIAGNOSIS — E782 Mixed hyperlipidemia: Secondary | ICD-10-CM | POA: Diagnosis not present

## 2021-08-25 DIAGNOSIS — R69 Illness, unspecified: Secondary | ICD-10-CM | POA: Diagnosis not present

## 2021-09-04 DIAGNOSIS — Z789 Other specified health status: Secondary | ICD-10-CM | POA: Diagnosis not present

## 2021-09-04 DIAGNOSIS — L814 Other melanin hyperpigmentation: Secondary | ICD-10-CM | POA: Diagnosis not present

## 2021-09-04 DIAGNOSIS — L538 Other specified erythematous conditions: Secondary | ICD-10-CM | POA: Diagnosis not present

## 2021-09-04 DIAGNOSIS — L820 Inflamed seborrheic keratosis: Secondary | ICD-10-CM | POA: Diagnosis not present

## 2021-09-04 DIAGNOSIS — R208 Other disturbances of skin sensation: Secondary | ICD-10-CM | POA: Diagnosis not present

## 2021-09-04 DIAGNOSIS — L298 Other pruritus: Secondary | ICD-10-CM | POA: Diagnosis not present

## 2021-09-04 DIAGNOSIS — D1801 Hemangioma of skin and subcutaneous tissue: Secondary | ICD-10-CM | POA: Diagnosis not present

## 2021-09-04 DIAGNOSIS — Z85828 Personal history of other malignant neoplasm of skin: Secondary | ICD-10-CM | POA: Diagnosis not present

## 2021-09-04 DIAGNOSIS — L821 Other seborrheic keratosis: Secondary | ICD-10-CM | POA: Diagnosis not present

## 2021-09-04 DIAGNOSIS — Z08 Encounter for follow-up examination after completed treatment for malignant neoplasm: Secondary | ICD-10-CM | POA: Diagnosis not present

## 2021-09-11 ENCOUNTER — Other Ambulatory Visit: Payer: Self-pay

## 2021-09-11 ENCOUNTER — Encounter: Payer: Self-pay | Admitting: Podiatry

## 2021-09-11 ENCOUNTER — Ambulatory Visit: Payer: Medicare HMO | Admitting: Podiatry

## 2021-09-11 DIAGNOSIS — M7752 Other enthesopathy of left foot: Secondary | ICD-10-CM | POA: Diagnosis not present

## 2021-09-11 DIAGNOSIS — M216X9 Other acquired deformities of unspecified foot: Secondary | ICD-10-CM | POA: Diagnosis not present

## 2021-09-11 DIAGNOSIS — M2042 Other hammer toe(s) (acquired), left foot: Secondary | ICD-10-CM | POA: Diagnosis not present

## 2021-09-11 DIAGNOSIS — M2041 Other hammer toe(s) (acquired), right foot: Secondary | ICD-10-CM | POA: Diagnosis not present

## 2021-09-12 NOTE — Progress Notes (Signed)
Subjective:   Patient ID: Diane Pruitt, female   DOB: 70 y.o.   MRN: [id number]   HPI Patient presents stating these toes are really bothering me and I would like as best as possible to get them corrected.  She states that she has trouble wearing shoe gear and gets a lot of pain in general in her foot   ROS      Objective:  Physical Exam  Vascular status intact with severe digital deformities digits 2 3 left with the second digit not contacting the surface at all with inflammation pain also of the second metatarsal phalangeal joint     Assessment:  Severe hammertoe deformity with rigid contracture digits 2 3 left with structural change also the second MPJ left     Plan:  H&P reviewed condition at great length.  At this point I discussed surgical intervention I did discuss amputation which she does not want and I discussed digital fusion digits 2 through shortening osteotomy and absolutely discussed that skin to be very difficult to get this toe completely down second toe I will do what I can do but very good chance of will elevate some but hopefully not be as tender and may ultimately require amputation.  She understands this completely and wants to take this approach and does not want amputation if she can avoid it and at this point I did allow her to read and then signed consent form understanding all risk and the fact that total recovery can take approximately 6 months.  Patient had air fracture walker dispensed that I would like to get her used to prior to the procedure and find a shoe on the other foot that balances her well and she is scheduled for her surgery the first week of January and is encouraged to call questions concerns  X-rays indicate there is severe deformity of the second and third digits left foot with rigid contracture and complete dislocation of the second MPJ

## 2021-09-27 DIAGNOSIS — I129 Hypertensive chronic kidney disease with stage 1 through stage 4 chronic kidney disease, or unspecified chronic kidney disease: Secondary | ICD-10-CM | POA: Diagnosis not present

## 2021-09-27 DIAGNOSIS — M810 Age-related osteoporosis without current pathological fracture: Secondary | ICD-10-CM | POA: Diagnosis not present

## 2021-09-27 DIAGNOSIS — R69 Illness, unspecified: Secondary | ICD-10-CM | POA: Diagnosis not present

## 2021-09-27 DIAGNOSIS — E782 Mixed hyperlipidemia: Secondary | ICD-10-CM | POA: Diagnosis not present

## 2021-09-27 DIAGNOSIS — D509 Iron deficiency anemia, unspecified: Secondary | ICD-10-CM | POA: Diagnosis not present

## 2021-09-27 DIAGNOSIS — N183 Chronic kidney disease, stage 3 unspecified: Secondary | ICD-10-CM | POA: Diagnosis not present

## 2021-09-27 DIAGNOSIS — I4891 Unspecified atrial fibrillation: Secondary | ICD-10-CM | POA: Diagnosis not present

## 2021-09-27 DIAGNOSIS — E039 Hypothyroidism, unspecified: Secondary | ICD-10-CM | POA: Diagnosis not present

## 2021-10-16 ENCOUNTER — Telehealth: Payer: Self-pay | Admitting: Urology

## 2021-10-16 NOTE — Telephone Encounter (Signed)
DOS - 11/07/21  METATARSAL OSTEOTOMY 2ND LEFT --- 85694 HAMMERTOE REPAIR 2,3 LEFT --- 37005  [employer] EFFECTIVE DATE - 11/29/20  SPOKE WITH GEL WITH [employer] AND HER STATED THAT CPT CODES 25910 AND 28902 NO PRIOR AUTH IS REQUIRED BUT INSURANCE TERMS ON 10/28/21  REF # [id number]  SPOKE WITH JOY WITH WITH [employer] AND SHE STATS THAT SHE WILL HAVE INSURANCE NEXT YEAR WITH [employer] AND WITH THAT PLAN NO PRIOR AUTH IS REQUIRED FOR CPT CODES 14830 AND 73543.  REF # [id number]

## 2021-11-01 DIAGNOSIS — G4710 Hypersomnia, unspecified: Secondary | ICD-10-CM | POA: Insufficient documentation

## 2021-11-01 DIAGNOSIS — M1A00X0 Idiopathic chronic gout, unspecified site, without tophus (tophi): Secondary | ICD-10-CM | POA: Insufficient documentation

## 2021-11-01 DIAGNOSIS — N183 Chronic kidney disease, stage 3 unspecified: Secondary | ICD-10-CM | POA: Insufficient documentation

## 2021-11-01 DIAGNOSIS — Z03818 Encounter for observation for suspected exposure to other biological agents ruled out: Secondary | ICD-10-CM | POA: Diagnosis not present

## 2021-11-01 DIAGNOSIS — M810 Age-related osteoporosis without current pathological fracture: Secondary | ICD-10-CM | POA: Insufficient documentation

## 2021-11-01 DIAGNOSIS — J0100 Acute maxillary sinusitis, unspecified: Secondary | ICD-10-CM | POA: Diagnosis not present

## 2021-11-01 DIAGNOSIS — F322 Major depressive disorder, single episode, severe without psychotic features: Secondary | ICD-10-CM | POA: Insufficient documentation

## 2021-11-01 DIAGNOSIS — D509 Iron deficiency anemia, unspecified: Secondary | ICD-10-CM | POA: Insufficient documentation

## 2021-11-01 DIAGNOSIS — Z860100 Personal history of colon polyps, unspecified: Secondary | ICD-10-CM | POA: Insufficient documentation

## 2021-11-01 DIAGNOSIS — Z20822 Contact with and (suspected) exposure to covid-19: Secondary | ICD-10-CM | POA: Diagnosis not present

## 2021-11-01 DIAGNOSIS — K635 Polyp of colon: Secondary | ICD-10-CM | POA: Insufficient documentation

## 2021-11-01 DIAGNOSIS — K2270 Barrett's esophagus without dysplasia: Secondary | ICD-10-CM | POA: Insufficient documentation

## 2021-11-01 DIAGNOSIS — I10 Essential (primary) hypertension: Secondary | ICD-10-CM | POA: Diagnosis not present

## 2021-11-01 DIAGNOSIS — E6609 Other obesity due to excess calories: Secondary | ICD-10-CM | POA: Insufficient documentation

## 2021-11-01 DIAGNOSIS — Z87891 Personal history of nicotine dependence: Secondary | ICD-10-CM | POA: Insufficient documentation

## 2021-11-01 DIAGNOSIS — D6859 Other primary thrombophilia: Secondary | ICD-10-CM | POA: Insufficient documentation

## 2021-11-01 DIAGNOSIS — Z8673 Personal history of transient ischemic attack (TIA), and cerebral infarction without residual deficits: Secondary | ICD-10-CM | POA: Insufficient documentation

## 2021-11-06 MED ORDER — HYDROCODONE-ACETAMINOPHEN 10-325 MG PO TABS: 1.0000 | ORAL_TABLET | Freq: Three times a day (TID) | ORAL | 0 refills | Status: AC | PRN

## 2021-11-06 NOTE — Addendum Note (Signed)
Addended by: Wallene Huh on: 11/06/2021 12:04 PM   Modules accepted: Orders

## 2021-11-07 ENCOUNTER — Telehealth: Payer: Self-pay | Admitting: *Deleted

## 2021-11-07 ENCOUNTER — Encounter: Payer: Self-pay | Admitting: Podiatry

## 2021-11-07 DIAGNOSIS — M205X2 Other deformities of toe(s) (acquired), left foot: Secondary | ICD-10-CM | POA: Diagnosis not present

## 2021-11-07 DIAGNOSIS — M2042 Other hammer toe(s) (acquired), left foot: Secondary | ICD-10-CM | POA: Diagnosis not present

## 2021-11-07 DIAGNOSIS — M21542 Acquired clubfoot, left foot: Secondary | ICD-10-CM | POA: Diagnosis not present

## 2021-11-07 NOTE — Telephone Encounter (Signed)
Patient calling for status of medication that was supposed to be at pharmacy.Called pharmacy and the medication has been picked up today per pharmacist, tried calling patient to inform, no answer, left vmessage.

## 2021-11-13 ENCOUNTER — Ambulatory Visit (INDEPENDENT_AMBULATORY_CARE_PROVIDER_SITE_OTHER): Payer: Medicare HMO | Admitting: Podiatry

## 2021-11-13 ENCOUNTER — Ambulatory Visit (INDEPENDENT_AMBULATORY_CARE_PROVIDER_SITE_OTHER): Payer: Medicare HMO

## 2021-11-13 ENCOUNTER — Other Ambulatory Visit: Payer: Self-pay

## 2021-11-13 DIAGNOSIS — Z9889 Other specified postprocedural states: Secondary | ICD-10-CM

## 2021-11-13 MED ORDER — PROMETHAZINE HCL 12.5 MG PO TABS: 12.5000 mg | ORAL_TABLET | Freq: Three times a day (TID) | ORAL | 0 refills | Status: DC | PRN

## 2021-11-13 MED ORDER — DOXYCYCLINE HYCLATE 100 MG PO TABS: 100.0000 mg | ORAL_TABLET | Freq: Two times a day (BID) | ORAL | 0 refills | Status: DC

## 2021-11-13 NOTE — Progress Notes (Signed)
° °  Subjective:  Patient presents today status post hammertoe repair digits 2, 3 of the left foot. DOS: 11/07/2021.  Patient states that she has been having nausea ever since the surgery.  Patient also states that she had to take the walking cam boot off and she never put it back on.  She has been walking without the boot.  She has kept the dressings clean dry and intact since surgery.  She is no longer taking the pain medication.  She presents for further treatment evaluation  Past Medical History:  Diagnosis Date   Dysrhythmia    Atrial Fibrillation 2 years ago   Hypertension    Stroke [facility])    Past Surgical History:  Procedure Laterality Date   CESAREAN SECTION     EYE SURGERY     INTRAOCULAR LENS INSERTION Bilateral About 3 years ago   KYPHOPLASTY N/A 02/18/2020   Procedure: KYPHOPLASTY L2;  Surgeon: Melina Schools, MD;  Location: [place];  Service: Orthopedics;  Laterality: N/A;  60 mins   TONSILLECTOMY     TUBAL LIGATION     No Known Allergies     Objective/Physical Exam Neurovascular status intact.  Skin incisions appear to be well coapted with sutures intact.  No dehiscence. No active bleeding noted.  There is some ecchymosis with edema noted throughout the forefoot postsurgically. The percutaneous fixation pin to the second digit of the left foot has slightly backed out as noted in the picture above.  Radiographic Exam:  Orthopedic screw noted to the second metatarsal of the surgical foot.  Percutaneous fixation pins are intact crossing the PIPJ.  There is backing out of the percutaneous fixation pin to the second digit.  Osteotomy sites are clean margins with routine healing  Assessment: 1. s/p hammertoe repair digits 2, 3 left foot. DOS: 11/07/2021   Plan of Care:  1. Patient was evaluated. X-rays reviewed 2.  The percutaneous fixation pin to the second digit was cleansed with Betadine and pushed back into normal positioning. 3.  Dry sterile dressings were applied.  Keep  clean dry and intact x1 week 4.  Prescription for doxycycline 100 mg 2 times daily #20 due to the increased erythema around the surgical forefoot 5.  Prescription for Phenergan 12.5 mg as needed postop nausea 6.  Surgical shoe was dispensed.  Patient states that she cannot tolerate in the cam boot.  Minimal weightbearing 7.  Return to clinic in in 1 week at neck scheduled appointment   Edrick Kins, DPM [facility]  Dr. Edrick Kins, DPM    [address], [place] [address]                Office [phone]  Fax [phone]

## 2021-11-20 ENCOUNTER — Other Ambulatory Visit: Payer: Self-pay

## 2021-11-20 ENCOUNTER — Ambulatory Visit (INDEPENDENT_AMBULATORY_CARE_PROVIDER_SITE_OTHER): Payer: Medicare HMO | Admitting: Podiatry

## 2021-11-20 DIAGNOSIS — Z9889 Other specified postprocedural states: Secondary | ICD-10-CM

## 2021-11-20 DIAGNOSIS — R899 Unspecified abnormal finding in specimens from other organs, systems and tissues: Secondary | ICD-10-CM | POA: Insufficient documentation

## 2021-11-20 DIAGNOSIS — I129 Hypertensive chronic kidney disease with stage 1 through stage 4 chronic kidney disease, or unspecified chronic kidney disease: Secondary | ICD-10-CM | POA: Insufficient documentation

## 2021-11-20 MED ORDER — PROMETHAZINE HCL 12.5 MG PO TABS: 12.5000 mg | ORAL_TABLET | Freq: Three times a day (TID) | ORAL | 0 refills | Status: DC | PRN

## 2021-11-20 NOTE — Progress Notes (Signed)
° °  Subjective:  Patient presents today status post hammertoe repair digits 2, 3 of the left foot. DOS: 11/07/2021.  Patient continues to have nausea ever since the surgery.  She says that it has improved but she is requesting more nausea medicine.  She is no longer taking any opioid pain medication.  She is back to only taking the regular medications that she took preoperatively, with exception of the oral doxycycline that was prescribed last week.  She presents for further treatment and evaluation  Past Medical History:  Diagnosis Date   Dysrhythmia    Atrial Fibrillation 2 years ago   Hypertension    Stroke [facility])    Past Surgical History:  Procedure Laterality Date   CESAREAN SECTION     EYE SURGERY     INTRAOCULAR LENS INSERTION Bilateral About 3 years ago   KYPHOPLASTY N/A 02/18/2020   Procedure: KYPHOPLASTY L2;  Surgeon: Melina Schools, MD;  Location: [place];  Service: Orthopedics;  Laterality: N/A;  60 mins   TONSILLECTOMY     TUBAL LIGATION     Allergies  Allergen Reactions   Colchicine     Other reaction(s): Other (See Comments), stomach upset     Objective/Physical Exam Neurovascular status intact.  Skin incisions continue to be well coapted with the sutures intact.  There is no drainage.  No active bleeding.  There continues to be some erythema around the incision site which has improved since last visit.  Slight elevation also of the second digit at the level of the MTP joint.  Unchanged from last week  Assessment: 1. s/p hammertoe repair digits 2, 3 left foot. DOS: 11/07/2021 2.  Cellulitis left surgical foot; improving  Plan of Care:  1. Patient was evaluated.  The percutaneous fixation pins are still in good alignment and not backing out.  2.  Continue minimal weightbearing in the postsurgical shoe 3.  Today dressings were changed.  Clean dry and intact x1 week 4.  Continue the oral doxycycline that was prescribed last visit until completed 5.  Refill prescription for  Phenergan 12.5 mg as needed nausea or vomiting  6.  Return to clinic in 1 week for follow-up   Edrick Kins, DPM [facility]  Dr. Edrick Kins, DPM    [address], [place] [address]                Office [phone]  Fax [phone]

## 2021-11-29 ENCOUNTER — Ambulatory Visit (INDEPENDENT_AMBULATORY_CARE_PROVIDER_SITE_OTHER): Payer: Medicare HMO | Admitting: Podiatry

## 2021-11-29 ENCOUNTER — Other Ambulatory Visit: Payer: Self-pay

## 2021-11-29 ENCOUNTER — Encounter: Payer: Self-pay | Admitting: Podiatry

## 2021-11-29 ENCOUNTER — Ambulatory Visit (INDEPENDENT_AMBULATORY_CARE_PROVIDER_SITE_OTHER): Payer: Medicare HMO

## 2021-11-29 DIAGNOSIS — Z9889 Other specified postprocedural states: Secondary | ICD-10-CM

## 2021-11-29 NOTE — Progress Notes (Signed)
Subjective:   Patient ID: Diane Pruitt, female   DOB: 71 y.o.   MRN: [id number]   HPI Patient presents doing well after surgery states that she is very pleased so far   ROS      Objective:  Physical Exam  Neurovascular status intact negative Bevelyn Buckles' sign noted wound edges well coapted digits in satisfactory position moderately elevated secondary to the severe deformity that we dealt with preoperatively but overall and significant improvement patient is very pleased     Assessment:  Severe deformity preoperatively with digital fusion shortening osteotomy      Plan:  H&P reviewed condition and recommended digital bracing and I dispensed a fascial brace to both lower the toes and provide compression across the arch and also instructed on gradual increase in activities but continue with open toed shoes surgical and reappoint 2 weeks pin removal  X-rays indicate the pins are in his good position is possible and she had severe preoperative deformity and we have accomplished the goal of lowering the toes and hopefully preventing amputation of digit

## 2021-12-12 DIAGNOSIS — E1169 Type 2 diabetes mellitus with other specified complication: Secondary | ICD-10-CM | POA: Diagnosis not present

## 2021-12-12 DIAGNOSIS — I25118 Atherosclerotic heart disease of native coronary artery with other forms of angina pectoris: Secondary | ICD-10-CM | POA: Diagnosis not present

## 2021-12-12 DIAGNOSIS — I480 Paroxysmal atrial fibrillation: Secondary | ICD-10-CM | POA: Diagnosis not present

## 2021-12-12 DIAGNOSIS — I10 Essential (primary) hypertension: Secondary | ICD-10-CM | POA: Diagnosis not present

## 2021-12-13 ENCOUNTER — Other Ambulatory Visit: Payer: Self-pay

## 2021-12-13 ENCOUNTER — Encounter: Payer: Self-pay | Admitting: Podiatry

## 2021-12-13 ENCOUNTER — Ambulatory Visit (INDEPENDENT_AMBULATORY_CARE_PROVIDER_SITE_OTHER): Payer: Medicare HMO | Admitting: Podiatry

## 2021-12-13 ENCOUNTER — Ambulatory Visit (INDEPENDENT_AMBULATORY_CARE_PROVIDER_SITE_OTHER): Payer: Medicare HMO

## 2021-12-13 DIAGNOSIS — Z9889 Other specified postprocedural states: Secondary | ICD-10-CM

## 2021-12-13 NOTE — Progress Notes (Signed)
Subjective:   Patient ID: Diane Pruitt, female   DOB: 71 y.o.   MRN: [id number]   HPI Patient states she is doing well with surgery very happy that her toe is straighter than it was even though it still is over the big toe it is much better than it was previously with pins intact second and third toes neuro   ROS      Objective:  Physical Exam  Vascular status intact negative Bevelyn Buckles' sign noted wound edges coapted well pins have come out somewhat distally but intact second and third toes with elevation still of the second toe but straight and no longer creating the stress she had previously     Assessment:  Overall doing well did have severe preoperative deformity and we have offered her the option of amputation that she did not want so I do think from a structural position this is the best position we can hope for     Plan:  Pins removed second and third digits sterile dressings applied discussed continued elevation compression and gradual return to soft shoe gear and encouraged her to try to gradually increase her shoe gear usage over the next few weeks  X-rays indicate osteotomy healing well and pin position good slight elevation second toe but straight

## 2021-12-14 DIAGNOSIS — E039 Hypothyroidism, unspecified: Secondary | ICD-10-CM | POA: Diagnosis not present

## 2021-12-14 DIAGNOSIS — R69 Illness, unspecified: Secondary | ICD-10-CM | POA: Diagnosis not present

## 2021-12-14 DIAGNOSIS — I129 Hypertensive chronic kidney disease with stage 1 through stage 4 chronic kidney disease, or unspecified chronic kidney disease: Secondary | ICD-10-CM | POA: Diagnosis not present

## 2021-12-14 DIAGNOSIS — E782 Mixed hyperlipidemia: Secondary | ICD-10-CM | POA: Diagnosis not present

## 2021-12-14 DIAGNOSIS — I4891 Unspecified atrial fibrillation: Secondary | ICD-10-CM | POA: Diagnosis not present

## 2021-12-14 DIAGNOSIS — D649 Anemia, unspecified: Secondary | ICD-10-CM | POA: Diagnosis not present

## 2021-12-14 DIAGNOSIS — Z79899 Other long term (current) drug therapy: Secondary | ICD-10-CM | POA: Diagnosis not present

## 2021-12-14 DIAGNOSIS — M810 Age-related osteoporosis without current pathological fracture: Secondary | ICD-10-CM | POA: Diagnosis not present

## 2021-12-14 DIAGNOSIS — E6609 Other obesity due to excess calories: Secondary | ICD-10-CM | POA: Diagnosis not present

## 2021-12-14 DIAGNOSIS — N183 Chronic kidney disease, stage 3 unspecified: Secondary | ICD-10-CM | POA: Diagnosis not present

## 2021-12-14 DIAGNOSIS — M1A09X0 Idiopathic chronic gout, multiple sites, without tophus (tophi): Secondary | ICD-10-CM | POA: Diagnosis not present

## 2022-01-03 ENCOUNTER — Other Ambulatory Visit: Payer: Self-pay

## 2022-01-03 ENCOUNTER — Inpatient Hospital Stay (HOSPITAL_BASED_OUTPATIENT_CLINIC_OR_DEPARTMENT_OTHER)
Admission: EM | Admit: 2022-01-03 | Discharge: 2022-01-06 | DRG: 378 | Disposition: A | Discharge status: Home / Self Care | Payer: Medicare HMO | Source: Ambulatory Visit | Attending: Internal Medicine | Admitting: Internal Medicine

## 2022-01-03 ENCOUNTER — Encounter (HOSPITAL_BASED_OUTPATIENT_CLINIC_OR_DEPARTMENT_OTHER): Payer: Self-pay | Admitting: Emergency Medicine

## 2022-01-03 ENCOUNTER — Emergency Department (HOSPITAL_BASED_OUTPATIENT_CLINIC_OR_DEPARTMENT_OTHER): Payer: Medicare HMO

## 2022-01-03 DIAGNOSIS — N179 Acute kidney failure, unspecified: Secondary | ICD-10-CM | POA: Diagnosis not present

## 2022-01-03 DIAGNOSIS — R161 Splenomegaly, not elsewhere classified: Secondary | ICD-10-CM | POA: Diagnosis not present

## 2022-01-03 DIAGNOSIS — I10 Essential (primary) hypertension: Secondary | ICD-10-CM

## 2022-01-03 DIAGNOSIS — K2270 Barrett's esophagus without dysplasia: Secondary | ICD-10-CM | POA: Diagnosis not present

## 2022-01-03 DIAGNOSIS — E039 Hypothyroidism, unspecified: Secondary | ICD-10-CM | POA: Diagnosis present

## 2022-01-03 DIAGNOSIS — K922 Gastrointestinal hemorrhage, unspecified: Principal | ICD-10-CM | POA: Diagnosis present

## 2022-01-03 DIAGNOSIS — Z7984 Long term (current) use of oral hypoglycemic drugs: Secondary | ICD-10-CM

## 2022-01-03 DIAGNOSIS — R7989 Other specified abnormal findings of blood chemistry: Secondary | ICD-10-CM

## 2022-01-03 DIAGNOSIS — E782 Mixed hyperlipidemia: Secondary | ICD-10-CM | POA: Diagnosis present

## 2022-01-03 DIAGNOSIS — M109 Gout, unspecified: Secondary | ICD-10-CM

## 2022-01-03 DIAGNOSIS — Z7901 Long term (current) use of anticoagulants: Secondary | ICD-10-CM

## 2022-01-03 DIAGNOSIS — K766 Portal hypertension: Secondary | ICD-10-CM | POA: Diagnosis present

## 2022-01-03 DIAGNOSIS — E119 Type 2 diabetes mellitus without complications: Secondary | ICD-10-CM

## 2022-01-03 DIAGNOSIS — Z20822 Contact with and (suspected) exposure to covid-19: Secondary | ICD-10-CM | POA: Diagnosis not present

## 2022-01-03 DIAGNOSIS — Z7141 Alcohol abuse counseling and surveillance of alcoholic: Secondary | ICD-10-CM

## 2022-01-03 DIAGNOSIS — Z961 Presence of intraocular lens: Secondary | ICD-10-CM | POA: Diagnosis present

## 2022-01-03 DIAGNOSIS — F32A Depression, unspecified: Secondary | ICD-10-CM | POA: Diagnosis present

## 2022-01-03 DIAGNOSIS — R188 Other ascites: Secondary | ICD-10-CM | POA: Diagnosis present

## 2022-01-03 DIAGNOSIS — K7460 Unspecified cirrhosis of liver: Secondary | ICD-10-CM | POA: Diagnosis present

## 2022-01-03 DIAGNOSIS — E1122 Type 2 diabetes mellitus with diabetic chronic kidney disease: Secondary | ICD-10-CM | POA: Diagnosis not present

## 2022-01-03 DIAGNOSIS — E669 Obesity, unspecified: Secondary | ICD-10-CM | POA: Diagnosis present

## 2022-01-03 DIAGNOSIS — I4891 Unspecified atrial fibrillation: Secondary | ICD-10-CM | POA: Diagnosis present

## 2022-01-03 DIAGNOSIS — R109 Unspecified abdominal pain: Secondary | ICD-10-CM | POA: Diagnosis not present

## 2022-01-03 DIAGNOSIS — F1090 Alcohol use, unspecified, uncomplicated: Secondary | ICD-10-CM | POA: Diagnosis not present

## 2022-01-03 DIAGNOSIS — M1A9XX1 Chronic gout, unspecified, with tophus (tophi): Secondary | ICD-10-CM | POA: Diagnosis present

## 2022-01-03 DIAGNOSIS — N1831 Chronic kidney disease, stage 3a: Secondary | ICD-10-CM | POA: Diagnosis not present

## 2022-01-03 DIAGNOSIS — K317 Polyp of stomach and duodenum: Secondary | ICD-10-CM | POA: Diagnosis present

## 2022-01-03 DIAGNOSIS — I129 Hypertensive chronic kidney disease with stage 1 through stage 4 chronic kidney disease, or unspecified chronic kidney disease: Secondary | ICD-10-CM | POA: Diagnosis present

## 2022-01-03 DIAGNOSIS — D509 Iron deficiency anemia, unspecified: Secondary | ICD-10-CM | POA: Diagnosis present

## 2022-01-03 DIAGNOSIS — D649 Anemia, unspecified: Secondary | ICD-10-CM | POA: Diagnosis present

## 2022-01-03 DIAGNOSIS — K3189 Other diseases of stomach and duodenum: Secondary | ICD-10-CM | POA: Diagnosis not present

## 2022-01-03 DIAGNOSIS — D62 Acute posthemorrhagic anemia: Secondary | ICD-10-CM | POA: Diagnosis present

## 2022-01-03 DIAGNOSIS — N183 Chronic kidney disease, stage 3 unspecified: Secondary | ICD-10-CM | POA: Diagnosis not present

## 2022-01-03 DIAGNOSIS — Z888 Allergy status to other drugs, medicaments and biological substances status: Secondary | ICD-10-CM

## 2022-01-03 DIAGNOSIS — Z79899 Other long term (current) drug therapy: Secondary | ICD-10-CM

## 2022-01-03 DIAGNOSIS — D500 Iron deficiency anemia secondary to blood loss (chronic): Secondary | ICD-10-CM | POA: Diagnosis present

## 2022-01-03 DIAGNOSIS — I480 Paroxysmal atrial fibrillation: Secondary | ICD-10-CM | POA: Diagnosis present

## 2022-01-03 DIAGNOSIS — Z8673 Personal history of transient ischemic attack (TIA), and cerebral infarction without residual deficits: Secondary | ICD-10-CM | POA: Diagnosis not present

## 2022-01-03 DIAGNOSIS — I639 Cerebral infarction, unspecified: Secondary | ICD-10-CM | POA: Diagnosis present

## 2022-01-03 DIAGNOSIS — Z6833 Body mass index (BMI) 33.0-33.9, adult: Secondary | ICD-10-CM

## 2022-01-03 DIAGNOSIS — Z79890 Hormone replacement therapy (postmenopausal): Secondary | ICD-10-CM

## 2022-01-03 DIAGNOSIS — E876 Hypokalemia: Secondary | ICD-10-CM | POA: Diagnosis not present

## 2022-01-03 DIAGNOSIS — I700 Atherosclerosis of aorta: Secondary | ICD-10-CM | POA: Diagnosis not present

## 2022-01-03 HISTORY — DX: Unspecified atrial fibrillation: I48.91

## 2022-01-03 LAB — CBC WITH DIFFERENTIAL/PLATELET
Abs Immature Granulocytes: 0.02 10*3/uL (ref 0.00–0.07)
Basophils Absolute: 0.1 10*3/uL (ref 0.0–0.1)
Basophils Relative: 1 %
Eosinophils Absolute: 0.2 10*3/uL (ref 0.0–0.5)
Eosinophils Relative: 2 %
HCT: 26.6 % — ABNORMAL LOW (ref 36.0–46.0)
Hemoglobin: 7.9 g/dL — ABNORMAL LOW (ref 12.0–15.0)
Immature Granulocytes: 0 %
Lymphocytes Relative: 16 %
Lymphs Abs: 1.1 10*3/uL (ref 0.7–4.0)
MCH: 26 pg (ref 26.0–34.0)
MCHC: 29.7 g/dL — ABNORMAL LOW (ref 30.0–36.0)
MCV: 87.5 fL (ref 80.0–100.0)
Monocytes Absolute: 0.6 10*3/uL (ref 0.1–1.0)
Monocytes Relative: 9 %
Neutro Abs: 5.3 10*3/uL (ref 1.7–7.7)
Neutrophils Relative %: 72 %
Platelets: 186 10*3/uL (ref 150–400)
RBC: 3.04 MIL/uL — ABNORMAL LOW (ref 3.87–5.11)
RDW: 17.5 % — ABNORMAL HIGH (ref 11.5–15.5)
WBC: 7.3 10*3/uL (ref 4.0–10.5)
nRBC: 0 % (ref 0.0–0.2)

## 2022-01-03 LAB — HEPATITIS PANEL, ACUTE
HCV Ab: NONREACTIVE
Hep A IgM: NONREACTIVE
Hep B C IgM: NONREACTIVE
Hepatitis B Surface Ag: NONREACTIVE

## 2022-01-03 LAB — IRON AND TIBC
Iron: 18 ug/dL — ABNORMAL LOW (ref 28–170)
Saturation Ratios: 4 % — ABNORMAL LOW (ref 10.4–31.8)
TIBC: 400 ug/dL (ref 250–450)
UIBC: 382 ug/dL

## 2022-01-03 LAB — RETICULOCYTES
Immature Retic Fract: 32.3 % — ABNORMAL HIGH (ref 2.3–15.9)
RBC.: 2.9 MIL/uL — ABNORMAL LOW (ref 3.87–5.11)
Retic Count, Absolute: 86.4 10*3/uL (ref 19.0–186.0)
Retic Ct Pct: 3 % (ref 0.4–3.1)

## 2022-01-03 LAB — COMPREHENSIVE METABOLIC PANEL
ALT: 41 U/L (ref 0–44)
AST: 82 U/L — ABNORMAL HIGH (ref 15–41)
Albumin: 3.4 g/dL — ABNORMAL LOW (ref 3.5–5.0)
Alkaline Phosphatase: 68 U/L (ref 38–126)
Anion gap: 11 (ref 5–15)
BUN: 15 mg/dL (ref 8–23)
CO2: 22 mmol/L (ref 22–32)
Calcium: 8.8 mg/dL — ABNORMAL LOW (ref 8.9–10.3)
Chloride: 104 mmol/L (ref 98–111)
Creatinine, Ser: 1.32 mg/dL — ABNORMAL HIGH (ref 0.44–1.00)
GFR, Estimated: 43 mL/min — ABNORMAL LOW (ref >60)
Glucose, Bld: 99 mg/dL (ref 70–99)
Potassium: 3.8 mmol/L (ref 3.5–5.1)
Sodium: 137 mmol/L (ref 135–145)
Total Bilirubin: 0.7 mg/dL (ref 0.3–1.2)
Total Protein: 6.5 g/dL (ref 6.5–8.1)

## 2022-01-03 LAB — FERRITIN: Ferritin: 81 ng/mL (ref 11–307)

## 2022-01-03 LAB — RESP PANEL BY RT-PCR (FLU A&B, COVID) ARPGX2
Influenza A by PCR: NEGATIVE
Influenza B by PCR: NEGATIVE
SARS Coronavirus 2 by RT PCR: NEGATIVE

## 2022-01-03 LAB — VITAMIN B12: Vitamin B-12: 303 pg/mL (ref 180–914)

## 2022-01-03 LAB — PROTIME-INR
INR: 1.8 — ABNORMAL HIGH (ref 0.8–1.2)
Prothrombin Time: 20.5 seconds — ABNORMAL HIGH (ref 11.4–15.2)

## 2022-01-03 LAB — CBC
HCT: 24.6 % — ABNORMAL LOW (ref 36.0–46.0)
Hemoglobin: 7.4 g/dL — ABNORMAL LOW (ref 12.0–15.0)
MCH: 26.4 pg (ref 26.0–34.0)
MCHC: 30.1 g/dL (ref 30.0–36.0)
MCV: 87.9 fL (ref 80.0–100.0)
Platelets: 167 10*3/uL (ref 150–400)
RBC: 2.8 MIL/uL — ABNORMAL LOW (ref 3.87–5.11)
RDW: 17.3 % — ABNORMAL HIGH (ref 11.5–15.5)
WBC: 6 10*3/uL (ref 4.0–10.5)
nRBC: 0 % (ref 0.0–0.2)

## 2022-01-03 LAB — PREPARE RBC (CROSSMATCH)

## 2022-01-03 LAB — LIPASE, BLOOD: Lipase: 44 U/L (ref 11–51)

## 2022-01-03 LAB — FOLATE: Folate: 10.2 ng/mL (ref >5.9)

## 2022-01-03 MED ORDER — SODIUM CHLORIDE 0.9 % IV SOLN
Freq: Once | INTRAVENOUS | Status: AC
Start: 2022-01-03 — End: 2022-01-04

## 2022-01-03 MED ORDER — ACETAMINOPHEN 650 MG RE SUPP: 650.0000 mg | Freq: Four times a day (QID) | RECTAL | Status: DC | PRN

## 2022-01-03 MED ORDER — AMIODARONE HCL 200 MG PO TABS
200.0000 mg | ORAL_TABLET | Freq: Two times a day (BID) | ORAL | Status: DC
  Administered 2022-01-03 – 2022-01-06 (×6): 200 mg via ORAL
  Filled 2022-01-03 (×6): qty 1

## 2022-01-03 MED ORDER — PANTOPRAZOLE SODIUM 40 MG IV SOLR
40.0000 mg | Freq: Once | INTRAVENOUS | Status: AC
  Administered 2022-01-03: 40 mg via INTRAVENOUS
  Filled 2022-01-03: qty 10

## 2022-01-03 MED ORDER — SODIUM CHLORIDE 0.9 % IV SOLN
2.0000 g | INTRAVENOUS | Status: AC
  Administered 2022-01-04 – 2022-01-05 (×2): 2 g via INTRAVENOUS
  Filled 2022-01-03 (×2): qty 20

## 2022-01-03 MED ORDER — DILTIAZEM HCL ER COATED BEADS 120 MG PO CP24
120.0000 mg | ORAL_CAPSULE | Freq: Every day | ORAL | Status: DC
  Administered 2022-01-04 – 2022-01-06 (×3): 120 mg via ORAL
  Filled 2022-01-03 (×3): qty 1

## 2022-01-03 MED ORDER — PANTOPRAZOLE SODIUM 40 MG IV SOLR
40.0000 mg | INTRAVENOUS | Status: DC
  Administered 2022-01-04: 10:00:00 40 mg via INTRAVENOUS
  Filled 2022-01-03: qty 10

## 2022-01-03 MED ORDER — SODIUM CHLORIDE 0.9% IV SOLUTION: Freq: Once | INTRAVENOUS | Status: DC

## 2022-01-03 MED ORDER — SODIUM CHLORIDE 0.9% FLUSH
3.0000 mL | Freq: Two times a day (BID) | INTRAVENOUS | Status: DC
  Administered 2022-01-04 – 2022-01-06 (×5): 3 mL via INTRAVENOUS

## 2022-01-03 MED ORDER — ACETAMINOPHEN 325 MG PO TABS: 650.0000 mg | ORAL_TABLET | Freq: Four times a day (QID) | ORAL | Status: DC | PRN

## 2022-01-03 MED ORDER — VITAMIN K1 10 MG/ML IJ SOLN
10.0000 mg | Freq: Every day | INTRAVENOUS | Status: AC
  Administered 2022-01-04 – 2022-01-05 (×2): 10 mg via INTRAVENOUS
  Filled 2022-01-03 (×2): qty 1

## 2022-01-03 MED ORDER — LEVOTHYROXINE SODIUM 100 MCG PO TABS
200.0000 ug | ORAL_TABLET | Freq: Every day | ORAL | Status: DC
  Administered 2022-01-04 – 2022-01-06 (×2): 200 ug via ORAL
  Filled 2022-01-03 (×3): qty 2

## 2022-01-03 MED ORDER — VITAMIN K1 10 MG/ML IJ SOLN
10.0000 mg | Freq: Once | INTRAMUSCULAR | Status: AC
  Administered 2022-01-03: 10 mg via INTRAVENOUS
  Filled 2022-01-03 (×2): qty 1

## 2022-01-03 MED ORDER — EZETIMIBE 10 MG PO TABS
10.0000 mg | ORAL_TABLET | Freq: Every day | ORAL | Status: DC
  Administered 2022-01-03 – 2022-01-05 (×3): 10 mg via ORAL
  Filled 2022-01-03 (×3): qty 1

## 2022-01-03 MED ORDER — ALLOPURINOL 100 MG PO TABS
200.0000 mg | ORAL_TABLET | Freq: Every day | ORAL | Status: DC
  Administered 2022-01-04 – 2022-01-06 (×3): 200 mg via ORAL
  Filled 2022-01-03 (×3): qty 2

## 2022-01-03 MED ORDER — CEFTRIAXONE SODIUM 1 G IJ SOLR
1.0000 g | Freq: Once | INTRAMUSCULAR | Status: AC
Start: 2022-01-03 — End: 2022-01-04
  Administered 2022-01-03: 1 g via INTRAVENOUS
  Filled 2022-01-03: qty 10

## 2022-01-03 MED ORDER — SERTRALINE HCL 50 MG PO TABS
50.0000 mg | ORAL_TABLET | Freq: Every day | ORAL | Status: DC
  Administered 2022-01-04 – 2022-01-06 (×3): 50 mg via ORAL
  Filled 2022-01-03 (×3): qty 1

## 2022-01-03 MED ORDER — IOHEXOL 300 MG/ML  SOLN
100.0000 mL | Freq: Once | INTRAMUSCULAR | Status: AC | PRN
  Administered 2022-01-03: 80 mL via INTRAVENOUS

## 2022-01-03 MED ORDER — ROSUVASTATIN CALCIUM 20 MG PO TABS
40.0000 mg | ORAL_TABLET | Freq: Every day | ORAL | Status: DC
  Administered 2022-01-04 – 2022-01-06 (×3): 40 mg via ORAL
  Filled 2022-01-03 (×3): qty 2

## 2022-01-03 NOTE — ED Triage Notes (Addendum)
Dizzy since January after foot surgery just has gotten worse ,   states went to see her pcp and was found to be anemic now 9.8 a drop since  12.8 8/22 she has notes from dr ?

## 2022-01-03 NOTE — ED Provider Notes (Signed)
?[facility] EMERGENCY DEPT ?Provider Note ? ? ?CSN: [id number] ?Arrival date & time: 01/03/22  1231 ? ?  ? ?History ? ?Chief Complaint  ?Patient presents with  ? Dizziness  ? ? ?Diane Pruitt is a 71 y.o. female. ? ?HPI ?71 year old female presents with anemia, dizziness, and abdominal distention.  She states she had hammertoe surgery in January and since then has been dizzy/lightheaded.  Mostly when she stands or exerts herself.  She is also been getting short of breath.  Her hemoglobin has dropped from 12.8-9.8 last month.  She did home occult blood testing and it was positive.  She was started on iron and since then her stools have been darker but otherwise she has not seen any black or bloody stools.  She is on Eliquis for A-fib.  She also has a history of hypertension and thyroid problems.  For the last 2 weeks she is noticed abdominal distention though no pain.  PCP evaluated her today and sent her here for evaluation of CBC and CT abdomen pelvis. ? ?Home Medications ?Prior to Admission medications   ?Medication Sig Start Date End Date Taking? Authorizing Provider  ?alendronate (FOSAMAX) 70 MG tablet alendronate 70 mg tablet ? TAKE 1 TABLET BY MOUTH 1 TIME A WEEK 30 MINUTES BEFORE FIRST FOOD OR BEVERAGE OR MEDICINE OF THE DAY WITH WATER    [provider]  ?allopurinol (ZYLOPRIM) 100 MG tablet Take 200 mg by mouth daily. 01/20/20   [provider]  ?allopurinol (ZYLOPRIM) 100 MG tablet Take 2 tablets by mouth daily.    [provider]  ?amiodarone (PACERONE) 200 MG tablet Take 200 mg by mouth in the morning and at bedtime.  02/27/18   [provider]  ?amiodarone (PACERONE) 200 MG tablet 1 tablet    [provider]  ?amoxicillin-clavulanate (AUGMENTIN) 875-125 MG tablet Take 1 tablet by mouth 2 (two) times daily. 11/01/21   [provider]  ?atorvastatin (LIPITOR) 80 MG tablet Take 1 tablet (80 mg total) by mouth daily at 6 PM. ?Patient taking  differently: Take 80 mg by mouth at bedtime.  01/28/18   Danford, Suann Larry, MD  ?Cholecalciferol (VITAMIN D) 50 MCG (2000 UT) tablet 1 tablet 05/03/21   [provider]  ?DILT-XR 180 MG 24 hr capsule Take 180 mg by mouth daily. 11/01/21   [provider]  ?diltiazem (CARDIZEM CD) 180 MG 24 hr capsule Take 180 mg by mouth daily.  02/12/18   [provider]  ?diltiazem (TIAZAC) 180 MG 24 hr capsule diltiazem CD 180 mg capsule,extended release 24 hr ? TAKE 1 CAPSULE BY MOUTH EVERY MORNING 02/12/18   [provider]  ?doxycycline (VIBRA-TABS) 100 MG tablet Take 1 tablet (100 mg total) by mouth 2 (two) times daily. 11/13/21   Edrick Kins, DPM  ?ELIQUIS 5 MG TABS tablet Take 5 mg by mouth 2 (two) times daily. 11/06/21   [provider]  ?ezetimibe (ZETIA) 10 MG tablet Take 10 mg by mouth at bedtime. 01/25/20   [provider]  ?ferrous sulfate 325 (65 FE) MG tablet 1 tablet 05/03/21   [provider]  ?FLUZONE HIGH-DOSE QUADRIVALENT 0.7 ML SUSY  07/19/21   [provider]  ?levothyroxine (SYNTHROID) 100 MCG tablet Take 100 mcg by mouth daily before breakfast. 09/23/19   [provider]  ?levothyroxine (SYNTHROID) 175 MCG tablet TAKE ONE TABLET BY MOUTH EVERY MORNING ON AN EMPTY STOMACH 11/01/21   [provider]  ?metFORMIN (GLUCOPHAGE) 500 MG  tablet Take 500 mg by mouth 2 (two) times daily. 01/21/20   [provider]  ?PFIZER COVID-19 VAC BIVALENT injection  07/19/21   [provider]  ?promethazine (PHENERGAN) 12.5 MG tablet Take 1 tablet (12.5 mg total) by mouth every 8 (eight) hours as needed for nausea or vomiting. 11/20/21   Edrick Kins, DPM  ?rosuvastatin (CRESTOR) 40 MG tablet Take 1 tablet by mouth daily. 11/01/21   [provider]  ?sertraline (ZOLOFT) 50 MG tablet 1 tablet 11/01/21   [provider]  ?   ? ?Allergies    ?Colchicine   ? ?Review of Systems   ?Review of Systems  ?Constitutional:   Negative for fever.  ?Respiratory:  Positive for shortness of breath.   ?Gastrointestinal:  Positive for abdominal distention. Negative for abdominal pain, blood in stool and rectal pain.  ?Neurological:  Positive for light-headedness.  ? ?Physical Exam ?Updated Vital Signs ?BP 105/67 (BP Location: Right Arm)   Pulse 68   Temp 98.2 ?F (36.8 ?C)   Resp 14   SpO2 98%  ?Physical Exam ?Vitals and nursing note reviewed.  ?Constitutional:   ?   General: She is not in acute distress. ?   Appearance: She is well-developed. She is not ill-appearing or diaphoretic.  ?HENT:  ?   Head: Normocephalic and atraumatic.  ?Eyes:  ?   Comments: Pale conjunctiva  ?Cardiovascular:  ?   Rate and Rhythm: Normal rate and regular rhythm.  ?   Heart sounds: Normal heart sounds.  ?Pulmonary:  ?   Effort: Pulmonary effort is normal.  ?   Breath sounds: Normal breath sounds.  ?Abdominal:  ?   Palpations: Abdomen is soft.  ?   Tenderness: There is no abdominal tenderness.  ?Skin: ?   General: Skin is warm and dry.  ?Neurological:  ?   Mental Status: She is alert.  ? ? ?ED Results / Procedures / Treatments   ?Labs ?(all labs ordered are listed, but only abnormal results are displayed) ?Labs Reviewed  ?COMPREHENSIVE METABOLIC PANEL - Abnormal; Notable for the following components:  ?    Result Value  ? Creatinine, Ser 1.32 (*)   ? Calcium 8.8 (*)   ? Albumin 3.4 (*)   ? AST 82 (*)   ? GFR, Estimated 43 (*)   ? All other components within normal limits  ?CBC WITH DIFFERENTIAL/PLATELET - Abnormal; Notable for the following components:  ? RBC 3.04 (*)   ? Hemoglobin 7.9 (*)   ? HCT 26.6 (*)   ? MCHC 29.7 (*)   ? RDW 17.5 (*)   ? All other components within normal limits  ?PROTIME-INR - Abnormal; Notable for the following components:  ? Prothrombin Time 20.5 (*)   ? INR 1.8 (*)   ? All other components within normal limits  ?RETICULOCYTES - Abnormal; Notable for the following components:  ? RBC. 2.90 (*)   ? Immature Retic Fract 32.3 (*)   ? All  other components within normal limits  ?LIPASE, BLOOD  ?VITAMIN B12  ?FOLATE  ?IRON AND TIBC  ?FERRITIN  ? ? ?EKG ?EKG Interpretation ? ?Date/Time:  Wednesday January 03 2022 12:39:14 EST ?Ventricular Rate:  71 ?PR Interval:  232 ?QRS Duration: 106 ?QT Interval:  416 ?QTC Calculation: 452 ?R Axis:   46 ?Text Interpretation: Sinus rhythm with 1st degree A-V block Incomplete left bundle branch block Nonspecific ST and T wave abnormality  overall similar to April 2021 Confirmed by Sherwood Gambler [phone])  on 01/03/2022 12:44:14 PM ? ?Radiology ?No results found. ? ?Procedures ?Procedures  ? ? ?Medications Ordered in ED ?Medications  ?iohexol (OMNIPAQUE) 300 MG/ML solution 100 mL (80 mLs Intravenous Contrast Given 01/03/22 1456)  ? ? ?ED Course/ Medical Decision Making/ A&P ?  ?                        ?Medical Decision Making ?Amount and/or Complexity of Data Reviewed ?Labs: ordered. ?Radiology: ordered. ? ?Risk ?Prescription drug management. ? ? ?Patient has normal vital signs though she did come in with a soft blood pressure at first.  I have reviewed the paperwork sent from her PCP office which indicates she has what appears to be iron deficiency anemia and her most recent hemoglobin was 9.8 in February.  Today on lab review her hemoglobin is down to 7.9.  She clearly is symptomatic though what ever has been going on has been a slow process.  However at this point given she is still downtrending I think she will need admission.  Given her report of abdominal distention (without abdominal pain currently), CT will be obtained.  This is currently pending.  She will ultimately need admission.  It seems that there is concerned she might be have GI bleeding as she had 3 positive FOBT as an outpatient recently.  No bloody or melanotic stools per her report. Care transferred to Dr. Dina Rich ? ? ? ? ? ? ? ?Final Clinical Impression(s) / ED Diagnoses ?Final diagnoses:  ?None  ? ? ?Rx / DC Orders ?ED Discharge Orders   ? ? None  ? ?   ? ? ?  ?Sherwood Gambler, MD ?01/03/22 1508 ? ?

## 2022-01-03 NOTE — H&P (Signed)
History and Physical   Diane Pruitt VOH:[id number] DOB: [birth year] DOA: 01/03/2022  PCP: [name], MD   Patient coming from: PCP office  Chief Complaint: Lightheadedness, abdominal distention, anemia  HPI: Diane Pruitt is a 71 y.o. female with medical history significant of depression, colonic polyps, anemia, CVA, hyperlipidemia, gout, diabetes, CKD 3, hypertension, atrial fibrillation, hypothyroidism, Barrett's esophagus presenting with ongoing anemia, dizziness, abdominal distention.  Patient noted to have symptoms of lightheadedness/dizziness since hammertoe surgery in January.  Also having some dyspnea on exertion.  Last month her hemoglobin was noted to drop from around 12.8-9.8.  Has had subsequent positive FOBT as a part of her work-up.  Has been started on iron replacement and has had darker stool since.  Has not had any change in this dark stools nor any bloody stools in this time.  He is chronically on Eliquis.  Evaluated by her PCP today and noted to have further drop in her hemoglobin to 7.9 with development of abdominal distention for the past 2 weeks.  Was sent to the ED for further evaluation.  Denies fevers, chills, chest pain, shortness of breath, abdominal pain, constipation, diarrhea, nausea, vomiting.  ED Course: Vital signs in the ED for blood pressure in the 69S to 854O systolic.  Lab work-up included CMP with creatinine elevated 1.32 from a baseline of 0.9, calcium 8.8 which improved considering albumin 3.4, AST 82.  Hemoglobin 7.9 as above with drop in the past month from 12.8-9.8 as above.  PT 20.5 and INR 1.8.  Hepatitis panel negative for all items, lipase normal, respiratory panel for flu and COVID-negative.  B12, folate normal.  Iron low at 18, ferritin borderline low at 81, reticulocyte count inappropriately normal.  CT of the abdomen pelvis showed hepatic cirrhosis with mild splenomegaly and moderate ascites with diverticular low cyst noted.  Patient states  ceftriaxone, IV PPI, IV fluids, vitamin K in the ED.  GI was consulted who recommended vitamin K and ceftriaxone for SBP prophylaxis.  Also recommended paracentesis with evaluation.  [place] was on-call and left initial recommendations however they saw a recent endoscopy on their review and noted patient previously followed with the goal will likely be continuing consult.  Review of Systems: As per HPI otherwise all other systems reviewed and are negative.  Past Medical History:  Diagnosis Date   Atrial fibrillation ([place])    Dysrhythmia    Atrial Fibrillation 2 years ago   Hypertension    Ischemic stroke ([place]) 01/25/2018   Stroke [facility])     Past Surgical History:  Procedure Laterality Date   CESAREAN SECTION     EYE SURGERY     INTRAOCULAR LENS INSERTION Bilateral About 3 years ago   KYPHOPLASTY N/A 02/18/2020   Procedure: KYPHOPLASTY L2;  Surgeon: Melina Schools, MD;  Location: [place];  Service: Orthopedics;  Laterality: N/A;  60 mins   TONSILLECTOMY     TUBAL LIGATION      Social History  reports that she has never smoked. She has never used smokeless tobacco. She reports that she does not drink alcohol and does not use drugs.  Allergies  Allergen Reactions   Colchicine     Other reaction(s): Other (See Comments), stomach upset    Family History  Problem Relation Age of Onset   Hypertension Mother    Hyperlipidemia Mother    Atrial fibrillation Mother    Hyperlipidemia Father    Atrial fibrillation Father   Reviewed on admission  Prior to Admission medications  Medication Sig Start Date End Date Taking? Authorizing Provider  alendronate (FOSAMAX) 70 MG tablet alendronate 70 mg tablet  TAKE 1 TABLET BY MOUTH 1 TIME A WEEK 30 MINUTES BEFORE FIRST FOOD OR BEVERAGE OR MEDICINE OF THE DAY WITH WATER    [provider]  allopurinol (ZYLOPRIM) 100 MG tablet Take 200 mg by mouth daily. 01/20/20   [provider]  amiodarone (PACERONE) 200 MG tablet Take 200 mg  by mouth in the morning and at bedtime.  02/27/18   [provider]  Cholecalciferol (VITAMIN D) 50 MCG (2000 UT) tablet 1 tablet 05/03/21   [provider]  diltiazem (CARDIZEM CD) 180 MG 24 hr capsule Take 180 mg by mouth daily.  02/12/18   [provider]  ELIQUIS 5 MG TABS tablet Take 5 mg by mouth 2 (two) times daily. 11/06/21   [provider]  ezetimibe (ZETIA) 10 MG tablet Take 10 mg by mouth at bedtime. 01/25/20   [provider]  ferrous sulfate 325 (65 FE) MG tablet 1 tablet 05/03/21   [provider]  FLUZONE HIGH-DOSE QUADRIVALENT 0.7 ML SUSY  07/19/21   [provider]  levothyroxine (SYNTHROID) 100 MCG tablet Take 100 mcg by mouth daily before breakfast. 09/23/19   [provider]  metFORMIN (GLUCOPHAGE) 500 MG tablet Take 500 mg by mouth 2 (two) times daily. 01/21/20   [provider]  PFIZER COVID-19 VAC BIVALENT injection  07/19/21   [provider]  promethazine (PHENERGAN) 12.5 MG tablet Take 1 tablet (12.5 mg total) by mouth every 8 (eight) hours as needed for nausea or vomiting. 11/20/21   Edrick Kins, DPM  rosuvastatin (CRESTOR) 40 MG tablet Take 1 tablet by mouth daily. 11/01/21   [provider]  sertraline (ZOLOFT) 50 MG tablet 1 tablet 11/01/21   [provider]    Physical Exam: Vitals:   01/03/22 1900 01/03/22 1953 01/03/22 2059 01/03/22 2318  BP: (!) 105/55 (!) 116/52 111/60   Pulse: 74 73 71   Resp: 18 (!) 28 20   Temp:   97.9 F (36.6 C)   TempSrc:   Oral   SpO2: 98% 98% 100%   Weight:    84.5 kg  Height:    '5\' 2"'$  (1.575 m)    Physical Exam Constitutional:      General: She is not in acute distress.    Appearance: Normal appearance.  HENT:     Head: Normocephalic and atraumatic.     Mouth/Throat:     Mouth: Mucous membranes are moist.     Pharynx: Oropharynx is clear.  Eyes:     Extraocular Movements: Extraocular movements intact.     Pupils: Pupils are  equal, round, and reactive to light.  Cardiovascular:     Rate and Rhythm: Normal rate and regular rhythm.     Pulses: Normal pulses.     Heart sounds: Normal heart sounds.  Pulmonary:     Effort: Pulmonary effort is normal. No respiratory distress.     Breath sounds: Normal breath sounds.  Abdominal:     General: Bowel sounds are normal. There is distension.     Palpations: Abdomen is soft.     Tenderness: There is no abdominal tenderness.  Musculoskeletal:        General: No swelling or deformity.  Skin:    General: Skin is warm and dry.  Neurological:     General: No focal deficit present.     Mental Status:  Mental status is at baseline.   Labs on Admission: I have personally reviewed following labs and imaging studies  CBC: Recent Labs  Lab 01/03/22 1355 01/03/22 2131  WBC 7.3 6.0  NEUTROABS 5.3  --   HGB 7.9* 7.4*  HCT 26.6* 24.6*  MCV 87.5 87.9  PLT 186 989    Basic Metabolic Panel: Recent Labs  Lab 01/03/22 1355  NA 137  K 3.8  CL 104  CO2 22  GLUCOSE 99  BUN 15  CREATININE 1.32*  CALCIUM 8.8*    GFR: Estimated Creatinine Clearance: 40 mL/min (A) (by C-G formula based on SCr of 1.32 mg/dL (H)).  Liver Function Tests: Recent Labs  Lab 01/03/22 1355  AST 82*  ALT 41  ALKPHOS 68  BILITOT 0.7  PROT 6.5  ALBUMIN 3.4*    Urine analysis:    Component Value Date/Time   COLORURINE YELLOW 02/15/2020 1129   APPEARANCEUR CLEAR 02/15/2020 1129   LABSPEC 1.014 02/15/2020 1129   PHURINE 6.0 02/15/2020 1129   GLUCOSEU NEGATIVE 02/15/2020 1129   HGBUR NEGATIVE 02/15/2020 1129   BILIRUBINUR NEGATIVE 02/15/2020 1129   KETONESUR NEGATIVE 02/15/2020 1129   PROTEINUR NEGATIVE 02/15/2020 1129   NITRITE NEGATIVE 02/15/2020 1129   LEUKOCYTESUR NEGATIVE 02/15/2020 1129    Radiological Exams on Admission: CT ABDOMEN PELVIS W CONTRAST  Result Date: 01/03/2022 CLINICAL DATA:  Lower abdominal pain. EXAM: CT ABDOMEN AND PELVIS WITH CONTRAST TECHNIQUE:  Multidetector CT imaging of the abdomen and pelvis was performed using the standard protocol following bolus administration of intravenous contrast. RADIATION DOSE REDUCTION: This exam was performed according to the departmental dose-optimization program which includes automated exposure control, adjustment of the mA and/or kV according to patient size and/or use of iterative reconstruction technique. CONTRAST:  13m OMNIPAQUE IOHEXOL 300 MG/ML  SOLN COMPARISON:  None. FINDINGS: Lower chest: No acute abnormality. Hepatobiliary: No gallstones or biliary dilatation is noted. Nodular hepatic contours are noted consistent with hepatic cirrhosis. No focal hepatic abnormality is noted. Pancreas: Unremarkable. No pancreatic ductal dilatation or surrounding inflammatory changes. Spleen: Mild splenomegaly is noted. Adrenals/Urinary Tract: Adrenal glands are unremarkable. Kidneys are normal, without renal calculi, focal lesion, or hydronephrosis. Bladder is unremarkable. Stomach/Bowel: Stomach is within normal limits. Appendix appears normal. No evidence of bowel wall thickening, distention, or inflammatory changes. Sigmoid diverticulosis is noted without inflammation. Vascular/Lymphatic: Aortic atherosclerosis. No enlarged abdominal or pelvic lymph nodes. Reproductive: Uterus and bilateral adnexa are unremarkable. Other: Moderate ascites is noted.  No hernia is noted. Musculoskeletal: Status post L2 kyphoplasty. No acute osseous abnormality is noted. IMPRESSION: Hepatic cirrhosis is noted with mild splenomegaly and moderate ascites. Sigmoid diverticulosis without inflammation. Aortic Atherosclerosis (ICD10-I70.0). Electronically Signed   By: JMarijo ConceptionM.D.   On: 01/03/2022 15:17    EKG: Independently reviewed.  Sinus rhythm at 79 bpm.  First-degree AV block noted.  Low voltage in multiple leads.  Nonspecific T wave flattening.  Similar to previous in April.  Assessment/Plan Principal Problem:   Symptomatic  anemia Active Problems:   Atrial fibrillation (HCC)   Hypertensive disorder   Acquired hypothyroidism   Barrett esophagus   Chronic kidney disease, stage 3 unspecified (HCC)   Gouty tophus of digit   History of embolic stroke   Mixed hyperlipidemia   Iron deficiency anemia   Symptomatic anemia > Patient presenting with ongoing anemia and in the setting of known drop in hemoglobin. > Past month hemoglobin drop from 12-9.8 and is found to be 7.9 today at PCP evaluation  was sent to the ED for further evaluation. > Also with abdominal distention/cirrhosis as below. > Symptoms reportedly started after hammertoe surgery in January however is noted to have low iron and folate and was started on iron supplementation outpatient which she has been taking. > Has had dark stools since starting iron but no change in his dark stools recently, also no bloody stools.  But does have history of positive FOBT during her work-up. > Symptomatic anemia with dizziness/lightheadedness in the ED.  Will benefit from transfusion. - GI consulted in ED, appreciate recommendations, will need to ensure that she is being followed tomorrow as [place] was initially consulted and is to be switched to [place] considering previous evaluations. - Monitor on telemetry - Continue to trend CBC - Type and screen - Transfuse 1 unit PRBC - Full liquid diet - Continue vitamin K x3 days as per GI recommendations - Continue IV PPI  Cirrhosis Ascites > Patient noted to have evidence of cirrhosis on CT well as moderate ascites. > Has reported abdominal distention for 2 weeks. > No abdominal pain but has been started on SBP prophylaxis per GI recommendations > GI left initial recommendations including SBP prophylaxis and recommendation for paracentesis.  As above [facility] initially consulted and plan for [facility] to continue evaluation considering she has seen them in the past. - Appreciate GI recommendations - Continue with  ceftriaxone daily - Paracentesis, IR consult with labs - Trend CMP - Trend PT and INR  AKI? CKD 3 > Creatinine elevated to 1.32 in the ED. unclear baseline.  Creatinine was 1.3 in February of this month, but was 1.0 in June of last year.  This is per chart review from [facility] provider records. - Continue to monitor creatinine response for worsening or improvement  Diabetes - SSI  History of CVA - Continue home Eliquis and rosuvastatin  Gout - Continue home allopurinol  Atrial fibrillation - Continue home amiodarone and diltiazem  Hypertension - Continue home diltiazem  Hypothyroidism - Continue home Synthroid  Depression - Continue home sertraline  DVT prophylaxis: SCDs Code Status:   Full Family Communication:  None on admission.  Patient states family has been informed. Disposition Plan:   Patient is from:  Home  Anticipated DC to:  Home  Anticipated DC date:  1 to 5 days  Anticipated DC barriers: None  Consults called:  GI, consulted in the ED.  Initially [place], plan to transition to [facility], please ensure someone is seeing her in the morning. Admission status:  Observation, telemetry  Severity of Illness: The appropriate patient status for this patient is OBSERVATION. Observation status is judged to be reasonable and necessary in order to provide the required intensity of service to ensure the patient's safety. The patient's presenting symptoms, physical exam findings, and initial radiographic and laboratory data in the context of their medical condition is felt to place them at decreased risk for further clinical deterioration. Furthermore, it is anticipated that the patient will be medically stable for discharge from the hospital within 2 midnights of admission.    Marcelyn Bruins MD [facility]  How to contact the [facility] Attending or Consulting provider [place] or covering provider during after hours [place], for this patient?   Check the care team in [facility]  and look for a) attending/consulting [facility] provider listed and b) the [facility] team listed Log into [email or web address] and use [place]'s universal password to access. If you do not have the password, please contact the  hospital operator. Locate the [facility] provider you are looking for under [facility] and page to a number that you can be directly reached. If you still have difficulty reaching the provider, please page the [facility] (Director on Call) for the Hospitalists listed on amion for assistance.  01/04/2022, 12:35 AM

## 2022-01-03 NOTE — ED Provider Notes (Addendum)
Patient signed out to me by previous provider. Please refer to their note for full HPI.  Briefly this is a 71 year old female who presented to the emergency department with concern for worsening anemia, dizziness, abdominal distention.  Noted to have downtrending anemia over the past month.  Baseline around 12, down to 7.9 today.  Known to be Hemoccult positive from outpatient work-up, is on Eliquis for atrial fibrillation.  Patient is anemic here but hemodynamically stable, no bright red bleeding noted.  We are pending CT scan and most likely admission. ?Physical Exam  ?BP (!) 95/55 (BP Location: Right Arm)   Pulse 70   Temp 98.2 ?F (36.8 ?C)   Resp 15   SpO2 98%  ? ?Physical Exam ?Vitals and nursing note reviewed.  ?Constitutional:   ?   General: She is not in acute distress. ?   Appearance: Normal appearance.  ?HENT:  ?   Head: Normocephalic.  ?   Mouth/Throat:  ?   Mouth: Mucous membranes are moist.  ?Cardiovascular:  ?   Rate and Rhythm: Normal rate.  ?Pulmonary:  ?   Effort: Pulmonary effort is normal. No respiratory distress.  ?Abdominal:  ?   General: There is distension.  ?   Palpations: Abdomen is soft.  ?   Tenderness: There is no abdominal tenderness. There is no guarding or rebound.  ?Skin: ?   General: Skin is warm.  ?Neurological:  ?   Mental Status: She is alert and oriented to person, place, and time. Mental status is at baseline.  ?Psychiatric:     ?   Mood and Affect: Mood normal.  ? ? ?Procedures  ?Procedures ? ?ED Course / MDM  ?  ?Medical Decision Making ?Amount and/or Complexity of Data Reviewed ?Labs: ordered. ?Radiology: ordered. ? ?Risk ?Prescription drug management. ?Decision regarding hospitalization. ? ? ?CT scan shows finding of cirrhosis with mild ascites.  INR slightly elevated at 1.8, otherwise LFTs and bilirubin are normal.  Patient states that she recently had an endoscopy however we are not able to see the results of that study.  She is unsure who/which group did this  procedure.  Spoke to admitting hospitalist, we will treat with IV fluids, prophylactic Protonix and transfer for admission.  I will bring this to the attention of the GI team at [facility].  Patients evaluation and results requires admission for further treatment and care.  Spoke with hospitalist Dr. Starla Link, reviewed patient's ED course and they accept admission.  Patient agrees with admission plan, offers no new complaints and is stable/unchanged at time of admit. ? ?Spoke with on-call for [facility], Dr. Lorenso Courier.  Mentions 10 mg of vitamin K IV for the next 3 days.  They also recommend prophylactic SBP coverage with Rocephin 1 g.  Planning for paracentesis/ascitic fluid testing and potential endoscopy.  They will evaluate the patient once she arrives at the admission facility.  Patient continues to be hemodynamically stable. ? ?Dr. Lorenso Courier was able to find old endoscopy, patient's care was with [facility].  I will reach out to the [place] team for continued GI care in consultation while she is at [facility]. ? ? ?  ?Lorelle Gibbs, DO ?01/03/22 1744 ? ?  ?Lorelle Gibbs, DO ?01/03/22 1932 ? ?  ?Lorelle Gibbs, DO ?01/03/22 2024 ? ?

## 2022-01-03 NOTE — ED Notes (Signed)
Patient requesting staff to retrieve cell phone from personal vehicle. Accompanied medic, Josh, to obtain patient phone. No other items retrieved and keys were given back to patient @ 1800.   ?

## 2022-01-04 ENCOUNTER — Observation Stay (HOSPITAL_COMMUNITY): Payer: Medicare HMO

## 2022-01-04 ENCOUNTER — Encounter (HOSPITAL_COMMUNITY): Payer: Self-pay | Admitting: Internal Medicine

## 2022-01-04 DIAGNOSIS — R188 Other ascites: Secondary | ICD-10-CM

## 2022-01-04 DIAGNOSIS — E876 Hypokalemia: Secondary | ICD-10-CM | POA: Diagnosis not present

## 2022-01-04 DIAGNOSIS — K7460 Unspecified cirrhosis of liver: Secondary | ICD-10-CM

## 2022-01-04 DIAGNOSIS — Z79890 Hormone replacement therapy (postmenopausal): Secondary | ICD-10-CM | POA: Diagnosis not present

## 2022-01-04 DIAGNOSIS — D509 Iron deficiency anemia, unspecified: Secondary | ICD-10-CM | POA: Diagnosis not present

## 2022-01-04 DIAGNOSIS — M1A9XX1 Chronic gout, unspecified, with tophus (tophi): Secondary | ICD-10-CM | POA: Diagnosis present

## 2022-01-04 DIAGNOSIS — K2270 Barrett's esophagus without dysplasia: Secondary | ICD-10-CM | POA: Diagnosis present

## 2022-01-04 DIAGNOSIS — K922 Gastrointestinal hemorrhage, unspecified: Secondary | ICD-10-CM | POA: Diagnosis present

## 2022-01-04 DIAGNOSIS — E669 Obesity, unspecified: Secondary | ICD-10-CM | POA: Diagnosis present

## 2022-01-04 DIAGNOSIS — F32A Depression, unspecified: Secondary | ICD-10-CM

## 2022-01-04 DIAGNOSIS — E039 Hypothyroidism, unspecified: Secondary | ICD-10-CM | POA: Diagnosis not present

## 2022-01-04 DIAGNOSIS — R69 Illness, unspecified: Secondary | ICD-10-CM | POA: Diagnosis not present

## 2022-01-04 DIAGNOSIS — Z20822 Contact with and (suspected) exposure to covid-19: Secondary | ICD-10-CM | POA: Diagnosis not present

## 2022-01-04 DIAGNOSIS — N1831 Chronic kidney disease, stage 3a: Secondary | ICD-10-CM

## 2022-01-04 DIAGNOSIS — E782 Mixed hyperlipidemia: Secondary | ICD-10-CM | POA: Diagnosis present

## 2022-01-04 DIAGNOSIS — I480 Paroxysmal atrial fibrillation: Secondary | ICD-10-CM | POA: Diagnosis not present

## 2022-01-04 DIAGNOSIS — R7989 Other specified abnormal findings of blood chemistry: Secondary | ICD-10-CM | POA: Diagnosis not present

## 2022-01-04 DIAGNOSIS — N179 Acute kidney failure, unspecified: Secondary | ICD-10-CM

## 2022-01-04 DIAGNOSIS — M109 Gout, unspecified: Secondary | ICD-10-CM

## 2022-01-04 DIAGNOSIS — K3189 Other diseases of stomach and duodenum: Secondary | ICD-10-CM | POA: Diagnosis not present

## 2022-01-04 DIAGNOSIS — R161 Splenomegaly, not elsewhere classified: Secondary | ICD-10-CM | POA: Diagnosis present

## 2022-01-04 DIAGNOSIS — I129 Hypertensive chronic kidney disease with stage 1 through stage 4 chronic kidney disease, or unspecified chronic kidney disease: Secondary | ICD-10-CM | POA: Diagnosis present

## 2022-01-04 DIAGNOSIS — Z7901 Long term (current) use of anticoagulants: Secondary | ICD-10-CM | POA: Diagnosis not present

## 2022-01-04 DIAGNOSIS — K317 Polyp of stomach and duodenum: Secondary | ICD-10-CM | POA: Diagnosis not present

## 2022-01-04 DIAGNOSIS — K766 Portal hypertension: Secondary | ICD-10-CM | POA: Diagnosis not present

## 2022-01-04 DIAGNOSIS — D649 Anemia, unspecified: Secondary | ICD-10-CM | POA: Diagnosis not present

## 2022-01-04 DIAGNOSIS — E1122 Type 2 diabetes mellitus with diabetic chronic kidney disease: Secondary | ICD-10-CM | POA: Diagnosis not present

## 2022-01-04 DIAGNOSIS — D62 Acute posthemorrhagic anemia: Secondary | ICD-10-CM | POA: Diagnosis not present

## 2022-01-04 DIAGNOSIS — Z8673 Personal history of transient ischemic attack (TIA), and cerebral infarction without residual deficits: Secondary | ICD-10-CM | POA: Diagnosis not present

## 2022-01-04 DIAGNOSIS — I10 Essential (primary) hypertension: Secondary | ICD-10-CM

## 2022-01-04 DIAGNOSIS — K2900 Acute gastritis without bleeding: Secondary | ICD-10-CM | POA: Diagnosis not present

## 2022-01-04 DIAGNOSIS — F1090 Alcohol use, unspecified, uncomplicated: Secondary | ICD-10-CM | POA: Diagnosis present

## 2022-01-04 DIAGNOSIS — K2980 Duodenitis without bleeding: Secondary | ICD-10-CM | POA: Diagnosis not present

## 2022-01-04 DIAGNOSIS — K319 Disease of stomach and duodenum, unspecified: Secondary | ICD-10-CM | POA: Diagnosis not present

## 2022-01-04 LAB — GLUCOSE, CAPILLARY
Glucose-Capillary: 83 mg/dL (ref 70–99)
Glucose-Capillary: 95 mg/dL (ref 70–99)
Glucose-Capillary: 105 mg/dL — ABNORMAL HIGH (ref 70–99)
Glucose-Capillary: 102 mg/dL — ABNORMAL HIGH (ref 70–99)
Glucose-Capillary: 96 mg/dL (ref 70–99)
Glucose-Capillary: 94 mg/dL (ref 70–99)

## 2022-01-04 LAB — BODY FLUID CELL COUNT WITH DIFFERENTIAL
Lymphs, Fluid: 46 %
Monocyte-Macrophage-Serous Fluid: 49 % — ABNORMAL LOW (ref 50–90)
Neutrophil Count, Fluid: 5 % (ref 0–25)
Total Nucleated Cell Count, Fluid: 146 cu mm (ref 0–1000)

## 2022-01-04 LAB — GLUCOSE, PLEURAL OR PERITONEAL FLUID: Glucose, Fluid: 95 mg/dL

## 2022-01-04 LAB — GRAM STAIN

## 2022-01-04 LAB — COMPREHENSIVE METABOLIC PANEL
ALT: 38 U/L (ref 0–44)
AST: 75 U/L — ABNORMAL HIGH (ref 15–41)
Albumin: 2.5 g/dL — ABNORMAL LOW (ref 3.5–5.0)
Alkaline Phosphatase: 61 U/L (ref 38–126)
Anion gap: 7 (ref 5–15)
BUN: 15 mg/dL (ref 8–23)
CO2: 22 mmol/L (ref 22–32)
Calcium: 7.9 mg/dL — ABNORMAL LOW (ref 8.9–10.3)
Chloride: 105 mmol/L (ref 98–111)
Creatinine, Ser: 1.06 mg/dL — ABNORMAL HIGH (ref 0.44–1.00)
GFR, Estimated: 57 mL/min — ABNORMAL LOW (ref >60)
Glucose, Bld: 86 mg/dL (ref 70–99)
Potassium: 3.7 mmol/L (ref 3.5–5.1)
Sodium: 134 mmol/L — ABNORMAL LOW (ref 135–145)
Total Bilirubin: 1.3 mg/dL — ABNORMAL HIGH (ref 0.3–1.2)
Total Protein: 5.8 g/dL — ABNORMAL LOW (ref 6.5–8.1)

## 2022-01-04 LAB — CBC
HCT: 26.5 % — ABNORMAL LOW (ref 36.0–46.0)
Hemoglobin: 8.2 g/dL — ABNORMAL LOW (ref 12.0–15.0)
MCH: 26.6 pg (ref 26.0–34.0)
MCHC: 30.9 g/dL (ref 30.0–36.0)
MCV: 86 fL (ref 80.0–100.0)
Platelets: 148 10*3/uL — ABNORMAL LOW (ref 150–400)
RBC: 3.08 MIL/uL — ABNORMAL LOW (ref 3.87–5.11)
RDW: 17.5 % — ABNORMAL HIGH (ref 11.5–15.5)
WBC: 6.1 10*3/uL (ref 4.0–10.5)
nRBC: 0 % (ref 0.0–0.2)

## 2022-01-04 LAB — HIV ANTIBODY (ROUTINE TESTING W REFLEX): HIV Screen 4th Generation wRfx: NONREACTIVE

## 2022-01-04 LAB — ALBUMIN, PLEURAL OR PERITONEAL FLUID: Albumin, Fluid: <1.5 g/dL

## 2022-01-04 LAB — LACTATE DEHYDROGENASE, PLEURAL OR PERITONEAL FLUID: LD, Fluid: 35 U/L — ABNORMAL HIGH (ref 3–23)

## 2022-01-04 LAB — PROTIME-INR
INR: 1.6 — ABNORMAL HIGH (ref 0.8–1.2)
Prothrombin Time: 18.9 seconds — ABNORMAL HIGH (ref 11.4–15.2)

## 2022-01-04 LAB — ABO/RH: ABO/RH(D): A POS

## 2022-01-04 MED ORDER — LIDOCAINE HCL 1 % IJ SOLN
INTRAMUSCULAR | Status: AC
  Administered 2022-01-04: 09:00:00 10 mL
  Filled 2022-01-04: qty 20

## 2022-01-04 MED ORDER — INSULIN ASPART 100 UNIT/ML IJ SOLN: 0.0000 [IU] | Freq: Three times a day (TID) | INTRAMUSCULAR | Status: DC

## 2022-01-04 MED ORDER — SODIUM CHLORIDE 0.9 % IV SOLN: INTRAVENOUS | Status: DC

## 2022-01-04 MED ORDER — PANTOPRAZOLE SODIUM 40 MG IV SOLR
40.0000 mg | Freq: Two times a day (BID) | INTRAVENOUS | Status: DC
  Administered 2022-01-04 – 2022-01-05 (×2): 40 mg via INTRAVENOUS
  Filled 2022-01-04 (×2): qty 10

## 2022-01-04 NOTE — Assessment & Plan Note (Addendum)
Acute blood loss anemia ?Possible acute GI bleeding ?-Presented with hemoglobin of 7.9.  Her hemoglobin recently dropped from 12-9.8 ?-Subsequent hemoglobin was 7.4.  Status post 1 unit packed red cell transfusion.  Hemoglobin 8 today ?-Currently on IV Protonix ?-Status post EGD on 01/05/2022 which showed portal hypertensive gastropathy; 3 duodenal polyps which were resected and retrieved.   ?-Subsequently, hemoglobin has remained stable and she has tolerated diet.  GI has cleared the patient for discharge.  She will be discharged home today on oral diuretics with outpatient follow-up with PCP and GI.  GI recommends to restart Eliquis from 01/07/2022 onwards. ?

## 2022-01-04 NOTE — Procedures (Signed)
PROCEDURE SUMMARY: ? ?Successful US guided paracentesis from RLQ.  ?Yielded 2.6L of yellow peritoneal fluid.  ?No immediate complications.  ?Pt tolerated well.  ? ?Specimen was sent for labs. ? ?EBL < 53m ? ?Katelee Schupp PA-C ?01/04/2022 ?9:12 AM ? ? ? ?

## 2022-01-04 NOTE — Assessment & Plan Note (Addendum)
-   Currently stable.  Continue statin.  Eliquis plan as above. ?

## 2022-01-04 NOTE — Assessment & Plan Note (Signed)
Stable Continue sertraline 

## 2022-01-04 NOTE — H&P (View-Only) (Signed)
Referring Provider: [facility] Primary Care Physician:  [name], MD Primary Gastroenterologist:  [facility] (Former patient of Dr. Cristina Gong)  Reason for Consultation:  Symptomatic anemia, new cirrhosis  HPI: Diane Pruitt is a 71 y.o. female medical history significant of depression, colonic polyps, anemia, CVA, hyperlipidemia, gout, diabetes, CKD 3, hypertension, atrial fibrillation (anticoagulated on Eliquis), hypothyroidism, Barrett's esophagus presents for evaluation of symptomatic anemia and new onset cirrhosis with ascites.  Patient states she has been having dizziness since her hammertoe surgery 10/2021. States she has also had shortness of breath on exertion since surgery. She was seen by PCP 11/2021, hgb was 9.8 (dropped from 12.8 05/2021). Patient was then put on iron supplementation. After being put in iron she noticed dark stools. Denies dark stools prior to iron supplementation.  Patient states over the last two week she has noticed her belly became distended. No previous history of abdominal distention. Denies abdominal pain. States she has been told she had elevated LFTs in early 2022. RUQ Ultrasound 10/2020 was normal. She states she has never been diagnosed with cirrhosis in the past. States over the last 15 years she drinks 1/2 bottle of liquid daily, she has cut this down recently to only a few drinks. Denies family history of liver disease or colon cancer. Denies nausea/vomiting. Denies unintentional weight loss. Denies NSAID use. Denies tobacco use.  EGD/colonoscopy 05/2021 done for heme positive stool with Dr. Cristina Gong EGD: Barrett's esophagus, normal stomach, normal duodenum, 2 cm hiatal hernia. repeat 3 years. Colonoscopy: 11 tubular adenomas, diverticulosis in sigmoid colon repeat 3 years.  Past Medical History:  Diagnosis Date   Atrial fibrillation ([place])    Dysrhythmia    Atrial Fibrillation 2 years ago   Hypertension    Ischemic stroke ([place]) 01/25/2018   Stroke [facility])      Past Surgical History:  Procedure Laterality Date   CESAREAN SECTION     EYE SURGERY     INTRAOCULAR LENS INSERTION Bilateral About 3 years ago   KYPHOPLASTY N/A 02/18/2020   Procedure: KYPHOPLASTY L2;  Surgeon: Melina Schools, MD;  Location: [place];  Service: Orthopedics;  Laterality: N/A;  60 mins   TONSILLECTOMY     TUBAL LIGATION      Prior to Admission medications   Medication Sig Start Date End Date Taking? Authorizing Provider  alendronate (FOSAMAX) 70 MG tablet alendronate 70 mg tablet  TAKE 1 TABLET BY MOUTH 1 TIME A WEEK 30 MINUTES BEFORE FIRST FOOD OR BEVERAGE OR MEDICINE OF THE DAY WITH WATER    [provider]  allopurinol (ZYLOPRIM) 100 MG tablet Take 200 mg by mouth daily. 01/20/20   [provider]  amiodarone (PACERONE) 200 MG tablet Take 200 mg by mouth in the morning and at bedtime.  02/27/18   [provider]  Cholecalciferol (VITAMIN D) 50 MCG (2000 UT) tablet 1 tablet 05/03/21   [provider]  diltiazem (CARDIZEM CD) 180 MG 24 hr capsule Take 180 mg by mouth daily.  02/12/18   [provider]  ELIQUIS 5 MG TABS tablet Take 5 mg by mouth 2 (two) times daily. 11/06/21   [provider]  ezetimibe (ZETIA) 10 MG tablet Take 10 mg by mouth at bedtime. 01/25/20   [provider]  ferrous sulfate 325 (65 FE) MG tablet 1 tablet 05/03/21   [provider]  FLUZONE HIGH-DOSE QUADRIVALENT 0.7 ML SUSY  07/19/21   [provider]  levothyroxine (SYNTHROID) 100 MCG tablet Take 100 mcg by mouth daily  before breakfast. 09/23/19   [provider]  metFORMIN (GLUCOPHAGE) 500 MG tablet Take 500 mg by mouth 2 (two) times daily. 01/21/20   [provider]  PFIZER COVID-19 VAC BIVALENT injection  07/19/21   [provider]  promethazine (PHENERGAN) 12.5 MG tablet Take 1 tablet (12.5 mg total) by mouth every 8 (eight) hours as needed for nausea or vomiting. 11/20/21   Edrick Kins, DPM   rosuvastatin (CRESTOR) 40 MG tablet Take 1 tablet by mouth daily. 11/01/21   [provider]  sertraline (ZOLOFT) 50 MG tablet 1 tablet 11/01/21   [provider]    Scheduled Meds:  sodium chloride   Intravenous Once   allopurinol  200 mg Oral Daily   amiodarone  200 mg Oral BID   diltiazem  120 mg Oral Daily   ezetimibe  10 mg Oral QHS   insulin aspart  0-9 Units Subcutaneous TID WC   levothyroxine  200 mcg Oral QAC breakfast   pantoprazole (PROTONIX) IV  40 mg Intravenous Q24H   rosuvastatin  40 mg Oral Daily   sertraline  50 mg Oral Daily   sodium chloride flush  3 mL Intravenous Q12H   Continuous Infusions:  cefTRIAXone (ROCEPHIN)  IV     phytonadione (VITAMIN K) IV     PRN Meds:.acetaminophen **OR** acetaminophen  Allergies as of 01/03/2022 - Review Complete 01/03/2022  Allergen Reaction Noted   Colchicine  07/17/2021    Family History  Problem Relation Age of Onset   Hypertension Mother    Hyperlipidemia Mother    Atrial fibrillation Mother    Hyperlipidemia Father    Atrial fibrillation Father     Social History   Socioeconomic History   Marital status: Divorced    Spouse name: Not on file   Number of children: Not on file   Years of education: Not on file   Highest education level: Not on file  Occupational History   Not on file  Tobacco Use   Smoking status: Never   Smokeless tobacco: Never  Vaping Use   Vaping Use: Never used  Substance and Sexual Activity   Alcohol use: Never   Drug use: Never   Sexual activity: Not on file  Other Topics Concern   Not on file  Social History Narrative   Not on file   Social Determinants of Health   Financial Resource Strain: Not on file  Food Insecurity: Not on file  Transportation Needs: Not on file  Physical Activity: Not on file  Stress: Not on file  Social Connections: Not on file  Intimate Partner Violence: Not on file    Review of Systems: Review of Systems  Constitutional:   Negative for chills and fever.       Dizziness  HENT:  Negative for hearing loss and tinnitus.   Eyes:  Negative for blurred vision and double vision.  Respiratory:  Positive for shortness of breath. Negative for cough and hemoptysis.   Cardiovascular:  Negative for chest pain and palpitations.  Gastrointestinal:  Positive for melena. Negative for abdominal pain, blood in stool, constipation, diarrhea, heartburn, nausea and vomiting.  Genitourinary:  Negative for dysuria and urgency.  Musculoskeletal:  Negative for myalgias and neck pain.  Skin:  Negative for itching and rash.  Neurological:  Negative for seizures and loss of consciousness.  Psychiatric/Behavioral:  Negative for memory loss. The patient is not nervous/anxious.     Physical Exam:Physical Exam Constitutional:      Appearance:  She is obese.  HENT:     Head: Normocephalic and atraumatic.     Nose: Nose normal. No congestion.  Eyes:     Extraocular Movements: Extraocular movements intact.     Comments: Conjunctival pallor  Cardiovascular:     Rate and Rhythm: Normal rate and regular rhythm.  Pulmonary:     Effort: Pulmonary effort is normal. No respiratory distress.  Abdominal:     General: Bowel sounds are normal. There is distension.     Palpations: Abdomen is soft. There is no mass.     Tenderness: There is no abdominal tenderness. There is no guarding or rebound.     Hernia: No hernia is present.     Comments: Negative asterisix  Musculoskeletal:        General: No swelling. Normal range of motion.     Cervical back: Normal range of motion and neck supple.  Skin:    General: Skin is warm and dry.  Neurological:     General: No focal deficit present.     Mental Status: She is alert and oriented to person, place, and time.  Psychiatric:        Mood and Affect: Mood normal.        Behavior: Behavior normal.        Thought Content: Thought content normal.        Judgment: Judgment normal.    Vital  signs: Vitals:   01/04/22 0135 01/04/22 0425  BP: 109/62 (!) 97/51  Pulse: 71 66  Resp: 20 20  Temp: 98.4 F (36.9 C) 98.4 F (36.9 C)  SpO2: 99% 97%   Last BM Date : 01/03/22    GI:  Lab Results: Recent Labs    01/03/22 1355 01/03/22 2131 01/04/22 0732  WBC 7.3 6.0 6.1  HGB 7.9* 7.4* 8.2*  HCT 26.6* 24.6* 26.5*  PLT 186 167 148*   BMET Recent Labs    01/03/22 1355  NA 137  K 3.8  CL 104  CO2 22  GLUCOSE 99  BUN 15  CREATININE 1.32*  CALCIUM 8.8*   LFT Recent Labs    01/03/22 1355  PROT 6.5  ALBUMIN 3.4*  AST 82*  ALT 41  ALKPHOS 68  BILITOT 0.7   PT/INR Recent Labs    01/03/22 1355 01/04/22 0732  LABPROT 20.5* 18.9*  INR 1.8* 1.6*     Studies/Results: CT ABDOMEN PELVIS W CONTRAST  Result Date: 01/03/2022 CLINICAL DATA:  Lower abdominal pain. EXAM: CT ABDOMEN AND PELVIS WITH CONTRAST TECHNIQUE: Multidetector CT imaging of the abdomen and pelvis was performed using the standard protocol following bolus administration of intravenous contrast. RADIATION DOSE REDUCTION: This exam was performed according to the departmental dose-optimization program which includes automated exposure control, adjustment of the mA and/or kV according to patient size and/or use of iterative reconstruction technique. CONTRAST:  17m OMNIPAQUE IOHEXOL 300 MG/ML  SOLN COMPARISON:  None. FINDINGS: Lower chest: No acute abnormality. Hepatobiliary: No gallstones or biliary dilatation is noted. Nodular hepatic contours are noted consistent with hepatic cirrhosis. No focal hepatic abnormality is noted. Pancreas: Unremarkable. No pancreatic ductal dilatation or surrounding inflammatory changes. Spleen: Mild splenomegaly is noted. Adrenals/Urinary Tract: Adrenal glands are unremarkable. Kidneys are normal, without renal calculi, focal lesion, or hydronephrosis. Bladder is unremarkable. Stomach/Bowel: Stomach is within normal limits. Appendix appears normal. No evidence of bowel wall  thickening, distention, or inflammatory changes. Sigmoid diverticulosis is noted without inflammation. Vascular/Lymphatic: Aortic atherosclerosis. No enlarged abdominal or pelvic lymph nodes. Reproductive:  Uterus and bilateral adnexa are unremarkable. Other: Moderate ascites is noted.  No hernia is noted. Musculoskeletal: Status post L2 kyphoplasty. No acute osseous abnormality is noted. IMPRESSION: Hepatic cirrhosis is noted with mild splenomegaly and moderate ascites. Sigmoid diverticulosis without inflammation. Aortic Atherosclerosis (ICD10-I70.0). Electronically Signed   By: Marijo Conception M.D.   On: 01/03/2022 15:17    Impression: Symptomatic Anemia - hgb 8.4 -iron 18 - ferritin 81  New Cirrhosis - hepatitis panel negative - Normal LFTs -CT abdomen pelvis with contrast 3/20: Hepatic cirrhosis, mild splenomegaly and moderate ascites.  Diverticulosis without inflammation. -  BUN 15, Cr. 1.06 - MELD Na: 17 HGB 8.2 Platelets 148 AST 82 ALT 41  Alkphos 68 TBili 0.7 GFR 43  INR 01/04/2022 1.6  - Paracentesis 3/9: 2.6 L yielded. Culture and cytology pending. - SAAG 1.0  Afib - last dose Eliquis Tuesday (3/7)  Plan: Plan for EGD tomorrow to evaluate anemia and screen for varices. I thoroughly discussed the procedures to include nature, alternatives, benefits, and risks including but not limited to bleeding, perforation, infection, anesthesia/cardiac and pulmonary complications. Patient provides understanding and gave verbal consent to proceed. Continue Protonix IV '40mg'$  BID Continue clear liquid diet NPO at midnight Negative hepatitis panel. Will order ceruloplasmin, alpha 1 anti-trypsin, ASMA/ANA, AFP. SAAG 1.0, suggests ascites if not caused by portal hypertension. Remainder of paracentesis fluid pending. Will hold off on dieretics today due to recent paracentesis and start low dose tomorrow. [facility] will follow.      LOS: 0 days   Diane Pruitt Radford Pax  PA-C 01/04/2022, 7:52  AM  Contact #  [phone]

## 2022-01-04 NOTE — Assessment & Plan Note (Addendum)
Ascites ?Elevated LFTs ?-Presented with CT evidence of cirrhosis with moderate ascites.  Possibly alcohol induced. ?-Status post ultrasound-guided paracentesis today with removal of 2.6 L fluid.  Currently on IV Rocephin for prophylaxis. ?-Continue diet and fluid restriction. ?-Monitor LFTs as an outpatient ?-Continue Lasix and spironolactone upon discharge: Were started on 01/05/2022 by GI.  Outpatient follow-up with GI. ?

## 2022-01-04 NOTE — Hospital Course (Addendum)
71 y.o. female with medical history significant of depression, unspecified anemia, unspecified CVA, hyperlipidemia, gout, diabetes mellitus type 2, CKD stage III, hypertension, paroxysmal atrial fibrillation on Eliquis, hypothyroidism, Barrett's esophagus presented with lightheadedness, abdominal distention and anemia along with intermittent dark stools.  She was seen at her PCPs office and was found to have hemoglobin of 7.9, dropped from her prior hemoglobin of 12 and subsequently 9.8.  On presentation, CT of the abdomen and pelvis showed hepatic cirrhosis with mild splenomegaly and moderate ascites.  She was started on IV PPI, Rocephin and vitamin K.  GI was consulted.  She underwent paracentesis and removal of 2.6 L fluid on 01/04/2022 by IR.  She underwent EGD on 01/05/2022 which showed portal hypertensive gastropathy; few duodenal polyps which were resected and retrieved.  Subsequently, hemoglobin has remained stable and she has tolerated diet.  GI has cleared the patient for discharge.  She will be discharged home today on oral diuretics with outpatient follow-up with PCP and GI. ?

## 2022-01-04 NOTE — Assessment & Plan Note (Addendum)
Paroxysmal A-fib ?-Continue Cardizem.  Currently rate controlled.  Eliquis plan as above ?

## 2022-01-04 NOTE — Consult Note (Signed)
Referring Provider: [facility] Primary Care Physician:  [name], MD Primary Gastroenterologist:  [facility] (Former patient of Dr. Cristina Gong)  Reason for Consultation:  Symptomatic anemia, new cirrhosis  HPI: Diane Pruitt is a 70 y.o. female medical history significant of depression, colonic polyps, anemia, CVA, hyperlipidemia, gout, diabetes, CKD 3, hypertension, atrial fibrillation (anticoagulated on Eliquis), hypothyroidism, Barrett's esophagus presents for evaluation of symptomatic anemia and new onset cirrhosis with ascites.  Patient states she has been having dizziness since her hammertoe surgery 10/2021. States she has also had shortness of breath on exertion since surgery. She was seen by PCP 11/2021, hgb was 9.8 (dropped from 12.8 05/2021). Patient was then put on iron supplementation. After being put in iron she noticed dark stools. Denies dark stools prior to iron supplementation.  Patient states over the last two week she has noticed her belly became distended. No previous history of abdominal distention. Denies abdominal pain. States she has been told she had elevated LFTs in early 2022. RUQ Ultrasound 10/2020 was normal. She states she has never been diagnosed with cirrhosis in the past. States over the last 15 years she drinks 1/2 bottle of liquid daily, she has cut this down recently to only a few drinks. Denies family history of liver disease or colon cancer. Denies nausea/vomiting. Denies unintentional weight loss. Denies NSAID use. Denies tobacco use.  EGD/colonoscopy 05/2021 done for heme positive stool with Dr. Cristina Gong EGD: Barrett's esophagus, normal stomach, normal duodenum, 2 cm hiatal hernia. repeat 3 years. Colonoscopy: 11 tubular adenomas, diverticulosis in sigmoid colon repeat 3 years.  Past Medical History:  Diagnosis Date   Atrial fibrillation ([place])    Dysrhythmia    Atrial Fibrillation 2 years ago   Hypertension    Ischemic stroke ([place]) 01/25/2018   Stroke [facility])      Past Surgical History:  Procedure Laterality Date   CESAREAN SECTION     EYE SURGERY     INTRAOCULAR LENS INSERTION Bilateral About 3 years ago   KYPHOPLASTY N/A 02/18/2020   Procedure: KYPHOPLASTY L2;  Surgeon: Melina Schools, MD;  Location: [place];  Service: Orthopedics;  Laterality: N/A;  60 mins   TONSILLECTOMY     TUBAL LIGATION      Prior to Admission medications   Medication Sig Start Date End Date Taking? Authorizing Provider  alendronate (FOSAMAX) 70 MG tablet alendronate 70 mg tablet  TAKE 1 TABLET BY MOUTH 1 TIME A WEEK 30 MINUTES BEFORE FIRST FOOD OR BEVERAGE OR MEDICINE OF THE DAY WITH WATER    [provider]  allopurinol (ZYLOPRIM) 100 MG tablet Take 200 mg by mouth daily. 01/20/20   [provider]  amiodarone (PACERONE) 200 MG tablet Take 200 mg by mouth in the morning and at bedtime.  02/27/18   [provider]  Cholecalciferol (VITAMIN D) 50 MCG (2000 UT) tablet 1 tablet 05/03/21   [provider]  diltiazem (CARDIZEM CD) 180 MG 24 hr capsule Take 180 mg by mouth daily.  02/12/18   [provider]  ELIQUIS 5 MG TABS tablet Take 5 mg by mouth 2 (two) times daily. 11/06/21   [provider]  ezetimibe (ZETIA) 10 MG tablet Take 10 mg by mouth at bedtime. 01/25/20   [provider]  ferrous sulfate 325 (65 FE) MG tablet 1 tablet 05/03/21   [provider]  FLUZONE HIGH-DOSE QUADRIVALENT 0.7 ML SUSY  07/19/21   [provider]  levothyroxine (SYNTHROID) 100 MCG tablet Take 100 mcg by mouth daily  before breakfast. 09/23/19   [provider]  metFORMIN (GLUCOPHAGE) 500 MG tablet Take 500 mg by mouth 2 (two) times daily. 01/21/20   [provider]  PFIZER COVID-19 VAC BIVALENT injection  07/19/21   [provider]  promethazine (PHENERGAN) 12.5 MG tablet Take 1 tablet (12.5 mg total) by mouth every 8 (eight) hours as needed for nausea or vomiting. 11/20/21   Edrick Kins, DPM   rosuvastatin (CRESTOR) 40 MG tablet Take 1 tablet by mouth daily. 11/01/21   [provider]  sertraline (ZOLOFT) 50 MG tablet 1 tablet 11/01/21   [provider]    Scheduled Meds:  sodium chloride   Intravenous Once   allopurinol  200 mg Oral Daily   amiodarone  200 mg Oral BID   diltiazem  120 mg Oral Daily   ezetimibe  10 mg Oral QHS   insulin aspart  0-9 Units Subcutaneous TID WC   levothyroxine  200 mcg Oral QAC breakfast   pantoprazole (PROTONIX) IV  40 mg Intravenous Q24H   rosuvastatin  40 mg Oral Daily   sertraline  50 mg Oral Daily   sodium chloride flush  3 mL Intravenous Q12H   Continuous Infusions:  cefTRIAXone (ROCEPHIN)  IV     phytonadione (VITAMIN K) IV     PRN Meds:.acetaminophen **OR** acetaminophen  Allergies as of 01/03/2022 - Review Complete 01/03/2022  Allergen Reaction Noted   Colchicine  07/17/2021    Family History  Problem Relation Age of Onset   Hypertension Mother    Hyperlipidemia Mother    Atrial fibrillation Mother    Hyperlipidemia Father    Atrial fibrillation Father     Social History   Socioeconomic History   Marital status: Divorced    Spouse name: Not on file   Number of children: Not on file   Years of education: Not on file   Highest education level: Not on file  Occupational History   Not on file  Tobacco Use   Smoking status: Never   Smokeless tobacco: Never  Vaping Use   Vaping Use: Never used  Substance and Sexual Activity   Alcohol use: Never   Drug use: Never   Sexual activity: Not on file  Other Topics Concern   Not on file  Social History Narrative   Not on file   Social Determinants of Health   Financial Resource Strain: Not on file  Food Insecurity: Not on file  Transportation Needs: Not on file  Physical Activity: Not on file  Stress: Not on file  Social Connections: Not on file  Intimate Partner Violence: Not on file    Review of Systems: Review of Systems  Constitutional:   Negative for chills and fever.       Dizziness  HENT:  Negative for hearing loss and tinnitus.   Eyes:  Negative for blurred vision and double vision.  Respiratory:  Positive for shortness of breath. Negative for cough and hemoptysis.   Cardiovascular:  Negative for chest pain and palpitations.  Gastrointestinal:  Positive for melena. Negative for abdominal pain, blood in stool, constipation, diarrhea, heartburn, nausea and vomiting.  Genitourinary:  Negative for dysuria and urgency.  Musculoskeletal:  Negative for myalgias and neck pain.  Skin:  Negative for itching and rash.  Neurological:  Negative for seizures and loss of consciousness.  Psychiatric/Behavioral:  Negative for memory loss. The patient is not nervous/anxious.     Physical Exam:Physical Exam Constitutional:      Appearance:  She is obese.  HENT:     Head: Normocephalic and atraumatic.     Nose: Nose normal. No congestion.  Eyes:     Extraocular Movements: Extraocular movements intact.     Comments: Conjunctival pallor  Cardiovascular:     Rate and Rhythm: Normal rate and regular rhythm.  Pulmonary:     Effort: Pulmonary effort is normal. No respiratory distress.  Abdominal:     General: Bowel sounds are normal. There is distension.     Palpations: Abdomen is soft. There is no mass.     Tenderness: There is no abdominal tenderness. There is no guarding or rebound.     Hernia: No hernia is present.     Comments: Negative asterisix  Musculoskeletal:        General: No swelling. Normal range of motion.     Cervical back: Normal range of motion and neck supple.  Skin:    General: Skin is warm and dry.  Neurological:     General: No focal deficit present.     Mental Status: She is alert and oriented to person, place, and time.  Psychiatric:        Mood and Affect: Mood normal.        Behavior: Behavior normal.        Thought Content: Thought content normal.        Judgment: Judgment normal.    Vital  signs: Vitals:   01/04/22 0135 01/04/22 0425  BP: 109/62 (!) 97/51  Pulse: 71 66  Resp: 20 20  Temp: 98.4 F (36.9 C) 98.4 F (36.9 C)  SpO2: 99% 97%   Last BM Date : 01/03/22    GI:  Lab Results: Recent Labs    01/03/22 1355 01/03/22 2131 01/04/22 0732  WBC 7.3 6.0 6.1  HGB 7.9* 7.4* 8.2*  HCT 26.6* 24.6* 26.5*  PLT 186 167 148*   BMET Recent Labs    01/03/22 1355  NA 137  K 3.8  CL 104  CO2 22  GLUCOSE 99  BUN 15  CREATININE 1.32*  CALCIUM 8.8*   LFT Recent Labs    01/03/22 1355  PROT 6.5  ALBUMIN 3.4*  AST 82*  ALT 41  ALKPHOS 68  BILITOT 0.7   PT/INR Recent Labs    01/03/22 1355 01/04/22 0732  LABPROT 20.5* 18.9*  INR 1.8* 1.6*     Studies/Results: CT ABDOMEN PELVIS W CONTRAST  Result Date: 01/03/2022 CLINICAL DATA:  Lower abdominal pain. EXAM: CT ABDOMEN AND PELVIS WITH CONTRAST TECHNIQUE: Multidetector CT imaging of the abdomen and pelvis was performed using the standard protocol following bolus administration of intravenous contrast. RADIATION DOSE REDUCTION: This exam was performed according to the departmental dose-optimization program which includes automated exposure control, adjustment of the mA and/or kV according to patient size and/or use of iterative reconstruction technique. CONTRAST:  48m OMNIPAQUE IOHEXOL 300 MG/ML  SOLN COMPARISON:  None. FINDINGS: Lower chest: No acute abnormality. Hepatobiliary: No gallstones or biliary dilatation is noted. Nodular hepatic contours are noted consistent with hepatic cirrhosis. No focal hepatic abnormality is noted. Pancreas: Unremarkable. No pancreatic ductal dilatation or surrounding inflammatory changes. Spleen: Mild splenomegaly is noted. Adrenals/Urinary Tract: Adrenal glands are unremarkable. Kidneys are normal, without renal calculi, focal lesion, or hydronephrosis. Bladder is unremarkable. Stomach/Bowel: Stomach is within normal limits. Appendix appears normal. No evidence of bowel wall  thickening, distention, or inflammatory changes. Sigmoid diverticulosis is noted without inflammation. Vascular/Lymphatic: Aortic atherosclerosis. No enlarged abdominal or pelvic lymph nodes. Reproductive:  Uterus and bilateral adnexa are unremarkable. Other: Moderate ascites is noted.  No hernia is noted. Musculoskeletal: Status post L2 kyphoplasty. No acute osseous abnormality is noted. IMPRESSION: Hepatic cirrhosis is noted with mild splenomegaly and moderate ascites. Sigmoid diverticulosis without inflammation. Aortic Atherosclerosis (ICD10-I70.0). Electronically Signed   By: Marijo Conception M.D.   On: 01/03/2022 15:17    Impression: Symptomatic Anemia - hgb 8.4 -iron 18 - ferritin 81  New Cirrhosis - hepatitis panel negative - Normal LFTs -CT abdomen pelvis with contrast 3/20: Hepatic cirrhosis, mild splenomegaly and moderate ascites.  Diverticulosis without inflammation. -  BUN 15, Cr. 1.06 - MELD Na: 17 HGB 8.2 Platelets 148 AST 82 ALT 41  Alkphos 68 TBili 0.7 GFR 43  INR 01/04/2022 1.6  - Paracentesis 3/9: 2.6 L yielded. Culture and cytology pending. - SAAG 1.0  Afib - last dose Eliquis Tuesday (3/7)  Plan: Plan for EGD tomorrow to evaluate anemia and screen for varices. I thoroughly discussed the procedures to include nature, alternatives, benefits, and risks including but not limited to bleeding, perforation, infection, anesthesia/cardiac and pulmonary complications. Patient provides understanding and gave verbal consent to proceed. Continue Protonix IV '40mg'$  BID Continue clear liquid diet NPO at midnight Negative hepatitis panel. Will order ceruloplasmin, alpha 1 anti-trypsin, ASMA/ANA, AFP. SAAG 1.0, suggests ascites if not caused by portal hypertension. Remainder of paracentesis fluid pending. Will hold off on dieretics today due to recent paracentesis and start low dose tomorrow. [facility] will follow.      LOS: 0 days   Paislie Tessler Radford Pax  PA-C 01/04/2022, 7:52  AM  Contact #  [phone]

## 2022-01-04 NOTE — Assessment & Plan Note (Signed)
Continue statin. 

## 2022-01-04 NOTE — Progress Notes (Signed)
?Progress Note ? ? ?Patient: Diane Pruitt HYQ:[id number] DOB: 1951/06/15 DOA: 01/03/2022     0 ?DOS: the patient was seen and examined on 01/04/2022 ?  ?Brief hospital course: ?71 y.o. female with medical history significant of depression, unspecified anemia, unspecified CVA, hyperlipidemia, gout, diabetes mellitus type 2, CKD stage III, hypertension, paroxysmal atrial fibrillation on Eliquis, hypothyroidism, Barrett's esophagus presented with lightheadedness, abdominal distention and anemia along with intermittent dark stools.  She was seen at her PCPs office and was found to have hemoglobin of 7.9, dropped from her prior hemoglobin of 12.8 and subsequently 9.8.  On presentation, CT of the abdomen and pelvis showed hepatic cirrhosis with mild splenomegaly and moderate ascites.  She was started on IV PPI, Rocephin and vitamin K.  GI was consulted. ? ?Assessment and Plan: ?* Symptomatic anemia ?Acute blood loss anemia ?Possible acute GI bleeding ?-Presented with hemoglobin of 7.9.  Her hemoglobin recently dropped from 12-9.8 ?-Subsequent hemoglobin was 7.4.  Status post 1 unit packed red cell transfusion.  Hemoglobin 8.2 today.  Monitor H&H. ?-Continue IV Protonix ?-GI has been consulted.  Follow recommendations. ? ?Cirrhosis of liver ([place]) ?Ascites ?Elevated LFTs ?-Presented with CT evidence of cirrhosis with moderate ascites.  Possibly alcohol induced. ?-Status post ultrasound-guided paracentesis today with removal of 2.6 L fluid.  Currently on IV Rocephin for prophylaxis. ?-Strict input and output.  Daily weights.  Fluid restriction.  Might need to start on diuretics soon. ?-LFTs improving.  More ? ?AKI (acute kidney injury) ([place]) ?CKD stage IIIa ?-Presented with creatinine 1.32.  Improving.  Creatinine 1.06 today.  Monitor. ? ?History of embolic stroke ?- Currently stable.  Continue statin.  Eliquis on hold. ? ?HTN (hypertension) ?- Monitor blood pressure.  Continue Cardizem ? ?Atrial fibrillation  ([place]) ?Paroxysmal A-fib ?-Continue Cardizem.  Currently rate controlled.  Eliquis on hold. ? ?Mixed hyperlipidemia ?- Continue statin ? ?Gout ?- Stable.  Continue allopurinol ? ?Depression ?- Stable.  Continue sertraline ? ?Acquired hypothyroidism ?- Continue Synthroid ? ? ? ? ?  ? ?Subjective:  ?Patient seen and examined at bedside.  Denies any current worsening shortness breath, nausea or vomiting or chest pain. ? ?Physical Exam: ?Vitals:  ? 01/04/22 0500 01/04/22 0812 01/04/22 0829 01/04/22 0909  ?BP:  118/60 (!) 122/59 (!) 112/57  ?Pulse:  69    ?Resp:  20    ?Temp:  98.1 ?F (36.7 ?C)    ?TempSrc:  Oral    ?SpO2:  100%    ?Weight: 84.7 kg     ?Height:      ? ?General: No acute distress, currently on room air ?ENT/neck: No elevated JVD.  No obvious masses  ?respiratory: Bilateral decreased breath sounds at bases with some scattered crackles ?CVS: S1-S2 heard, rate controlled ?Abdominal: Soft, obese, nontender, distended, no organomegaly, bowel sounds heard ?Extremities: No cyanosis, clubbing; trace lower extremity edema present  ?CNS: Alert, awake and oriented.  No focal neurologic deficit.  Moving extremities. ?Lymph: No cervical lymphadenopathy ?Skin: No rashes, lesions, ulcers ?Psych: Normal mood, affect and judgment ?Musculoskeletal: No obvious joint deformity/tenderness/swelling ? ?Data Reviewed: ?I have reviewed patient's investigations including blood work and imaging during this hospitalization myself.  Today, sodium is 134, potassium 3.7, creatinine 1.06, AST 75, ALT 38, total bili 1.3, hemoglobin 8.2, platelets 148 ? ?Family Communication: None at bedside ? ?Disposition: ?Status is: Observation ?The patient will require care spanning > 2 midnights and should be moved to inpatient because: Need for GI procedure ? Planned Discharge Destination: Home ? ? ? ?  Author: ?Aline August, MD ?01/04/2022 11:40 AM ? ?For on call review [email or web address].  ?

## 2022-01-04 NOTE — Assessment & Plan Note (Signed)
-   Stable.  Continue allopurinol ?

## 2022-01-04 NOTE — Assessment & Plan Note (Addendum)
-   Outpatient follow-up.  Continue Cardizem, Lasix and spironolactone ?

## 2022-01-04 NOTE — Assessment & Plan Note (Signed)
Continue Synthroid °

## 2022-01-04 NOTE — Assessment & Plan Note (Addendum)
CKD stage IIIa ?-Presented with creatinine 1.32.  Improving.  Creatinine 1.3 today.  Outpatient follow-up. ?

## 2022-01-05 ENCOUNTER — Inpatient Hospital Stay (HOSPITAL_COMMUNITY): Payer: Medicare HMO | Admitting: Certified Registered Nurse Anesthetist

## 2022-01-05 ENCOUNTER — Encounter (HOSPITAL_COMMUNITY): Payer: Self-pay | Admitting: Internal Medicine

## 2022-01-05 ENCOUNTER — Encounter (HOSPITAL_COMMUNITY)
Admission: EM | Disposition: A | Discharge status: Home / Self Care | Payer: Self-pay | Source: Ambulatory Visit | Attending: Internal Medicine

## 2022-01-05 DIAGNOSIS — F32A Depression, unspecified: Secondary | ICD-10-CM

## 2022-01-05 DIAGNOSIS — K317 Polyp of stomach and duodenum: Secondary | ICD-10-CM

## 2022-01-05 DIAGNOSIS — N1831 Chronic kidney disease, stage 3a: Secondary | ICD-10-CM | POA: Diagnosis not present

## 2022-01-05 DIAGNOSIS — K922 Gastrointestinal hemorrhage, unspecified: Secondary | ICD-10-CM

## 2022-01-05 DIAGNOSIS — N179 Acute kidney failure, unspecified: Secondary | ICD-10-CM | POA: Diagnosis not present

## 2022-01-05 DIAGNOSIS — D649 Anemia, unspecified: Secondary | ICD-10-CM | POA: Diagnosis not present

## 2022-01-05 DIAGNOSIS — D62 Acute posthemorrhagic anemia: Secondary | ICD-10-CM | POA: Diagnosis not present

## 2022-01-05 DIAGNOSIS — K3189 Other diseases of stomach and duodenum: Secondary | ICD-10-CM

## 2022-01-05 DIAGNOSIS — D509 Iron deficiency anemia, unspecified: Secondary | ICD-10-CM

## 2022-01-05 DIAGNOSIS — K766 Portal hypertension: Secondary | ICD-10-CM

## 2022-01-05 HISTORY — PX: ESOPHAGOGASTRODUODENOSCOPY (EGD) WITH PROPOFOL: SHX5813

## 2022-01-05 LAB — CBC WITH DIFFERENTIAL/PLATELET
Abs Immature Granulocytes: 0.02 10*3/uL (ref 0.00–0.07)
Basophils Absolute: 0.1 10*3/uL (ref 0.0–0.1)
Basophils Relative: 1 %
Eosinophils Absolute: 0.2 10*3/uL (ref 0.0–0.5)
Eosinophils Relative: 3 %
HCT: 27.7 % — ABNORMAL LOW (ref 36.0–46.0)
Hemoglobin: 8.8 g/dL — ABNORMAL LOW (ref 12.0–15.0)
Immature Granulocytes: 0 %
Lymphocytes Relative: 20 %
Lymphs Abs: 1.2 10*3/uL (ref 0.7–4.0)
MCH: 27.3 pg (ref 26.0–34.0)
MCHC: 31.8 g/dL (ref 30.0–36.0)
MCV: 86 fL (ref 80.0–100.0)
Monocytes Absolute: 0.5 10*3/uL (ref 0.1–1.0)
Monocytes Relative: 8 %
Neutro Abs: 4.1 10*3/uL (ref 1.7–7.7)
Neutrophils Relative %: 68 %
Platelets: 174 10*3/uL (ref 150–400)
RBC: 3.22 MIL/uL — ABNORMAL LOW (ref 3.87–5.11)
RDW: 17.3 % — ABNORMAL HIGH (ref 11.5–15.5)
WBC: 6.1 10*3/uL (ref 4.0–10.5)
nRBC: 0 % (ref 0.0–0.2)

## 2022-01-05 LAB — COMPREHENSIVE METABOLIC PANEL
ALT: 40 U/L (ref 0–44)
AST: 83 U/L — ABNORMAL HIGH (ref 15–41)
Albumin: 2.6 g/dL — ABNORMAL LOW (ref 3.5–5.0)
Alkaline Phosphatase: 63 U/L (ref 38–126)
Anion gap: 8 (ref 5–15)
BUN: 13 mg/dL (ref 8–23)
CO2: 24 mmol/L (ref 22–32)
Calcium: 8.2 mg/dL — ABNORMAL LOW (ref 8.9–10.3)
Chloride: 106 mmol/L (ref 98–111)
Creatinine, Ser: 1.26 mg/dL — ABNORMAL HIGH (ref 0.44–1.00)
GFR, Estimated: 46 mL/min — ABNORMAL LOW (ref >60)
Glucose, Bld: 96 mg/dL (ref 70–99)
Potassium: 4 mmol/L (ref 3.5–5.1)
Sodium: 138 mmol/L (ref 135–145)
Total Bilirubin: 0.4 mg/dL (ref 0.3–1.2)
Total Protein: 6.1 g/dL — ABNORMAL LOW (ref 6.5–8.1)

## 2022-01-05 LAB — GLUCOSE, CAPILLARY
Glucose-Capillary: 115 mg/dL — ABNORMAL HIGH (ref 70–99)
Glucose-Capillary: 96 mg/dL (ref 70–99)

## 2022-01-05 LAB — BPAM RBC
Blood Product Expiration Date: 202303302359
ISSUE DATE / TIME: 202303090116
Unit Type and Rh: 6200

## 2022-01-05 LAB — TYPE AND SCREEN
ABO/RH(D): A POS
Antibody Screen: NEGATIVE
Unit division: 0

## 2022-01-05 LAB — CYTOLOGY - NON PAP

## 2022-01-05 LAB — MAGNESIUM: Magnesium: 2.1 mg/dL (ref 1.7–2.4)

## 2022-01-05 SURGERY — ESOPHAGOGASTRODUODENOSCOPY (EGD) WITH PROPOFOL
Anesthesia: Monitor Anesthesia Care

## 2022-01-05 MED ORDER — FUROSEMIDE 20 MG PO TABS
20.0000 mg | ORAL_TABLET | Freq: Every day | ORAL | Status: DC
  Administered 2022-01-05 – 2022-01-06 (×2): 20 mg via ORAL
  Filled 2022-01-05 (×2): qty 1

## 2022-01-05 MED ORDER — PROSIGHT PO TABS
1.0000 | ORAL_TABLET | Freq: Every day | ORAL | Status: DC
  Administered 2022-01-06: 1 via ORAL
  Filled 2022-01-05: qty 1

## 2022-01-05 MED ORDER — PROPOFOL 500 MG/50ML IV EMUL
INTRAVENOUS | Status: DC | PRN
  Administered 2022-01-05: 175 ug/kg/min via INTRAVENOUS

## 2022-01-05 MED ORDER — LACTATED RINGERS IV SOLN
INTRAVENOUS | Status: AC | PRN
  Administered 2022-01-05: 20 mL/h via INTRAVENOUS

## 2022-01-05 MED ORDER — ALBUMIN HUMAN 25 % IV SOLN
25.0000 g | Freq: Four times a day (QID) | INTRAVENOUS | Status: DC
  Administered 2022-01-05 – 2022-01-06 (×3): 25 g via INTRAVENOUS
  Filled 2022-01-05 (×4): qty 100

## 2022-01-05 MED ORDER — EPHEDRINE SULFATE-NACL 50-0.9 MG/10ML-% IV SOSY
PREFILLED_SYRINGE | INTRAVENOUS | Status: DC | PRN
  Administered 2022-01-05: 5 mg via INTRAVENOUS

## 2022-01-05 MED ORDER — LEVOTHYROXINE SODIUM 100 MCG PO TABS: 200.0000 ug | ORAL_TABLET | Freq: Every morning | ORAL | Status: DC

## 2022-01-05 MED ORDER — PANTOPRAZOLE SODIUM 40 MG IV SOLR
40.0000 mg | INTRAVENOUS | Status: DC
  Administered 2022-01-06: 40 mg via INTRAVENOUS
  Filled 2022-01-05: qty 10

## 2022-01-05 MED ORDER — ADULT MULTIVITAMIN W/MINERALS CH
1.0000 | ORAL_TABLET | Freq: Every day | ORAL | Status: DC
  Administered 2022-01-06: 1 via ORAL
  Filled 2022-01-05: qty 1

## 2022-01-05 MED ORDER — DILTIAZEM HCL ER COATED BEADS 120 MG PO CP24: 120.0000 mg | ORAL_CAPSULE | Freq: Every day | ORAL | Status: DC

## 2022-01-05 MED ORDER — SPIRONOLACTONE 25 MG PO TABS
50.0000 mg | ORAL_TABLET | Freq: Every day | ORAL | Status: DC
  Administered 2022-01-05 – 2022-01-06 (×2): 50 mg via ORAL
  Filled 2022-01-05 (×2): qty 2

## 2022-01-05 SURGICAL SUPPLY — 15 items

## 2022-01-05 NOTE — Progress Notes (Signed)
?Progress Note ? ? ?Patient: Diane Pruitt OIN:[id number] DOB: [birth year] DOA: 01/03/2022     1 ?DOS: the patient was seen and examined on 01/05/2022 ?  ?Brief hospital course: ?71 y.o. female with medical history significant of depression, unspecified anemia, unspecified CVA, hyperlipidemia, gout, diabetes mellitus type 2, CKD stage III, hypertension, paroxysmal atrial fibrillation on Eliquis, hypothyroidism, Barrett's esophagus presented with lightheadedness, abdominal distention and anemia along with intermittent dark stools.  She was seen at her PCPs office and was found to have hemoglobin of 7.9, dropped from her prior hemoglobin of 12 and subsequently 9.8.  On presentation, CT of the abdomen and pelvis showed hepatic cirrhosis with mild splenomegaly and moderate ascites.  She was started on IV PPI, Rocephin and vitamin K.  GI was consulted.  She underwent paracentesis and removal of 2.6 L fluid on 01/04/2022 by IR. ? ?Assessment and Plan: ?* Symptomatic anemia ?Acute blood loss anemia ?Possible acute GI bleeding ?-Presented with hemoglobin of 7.9.  Her hemoglobin recently dropped from 12-9.8 ?-Subsequent hemoglobin was 7.4.  Status post 1 unit packed red cell transfusion.  Hemoglobin 8.8 today.  Monitor H&H. ?-Continue IV Protonix ?-GI following and planning for EGD today. ? ?Cirrhosis of liver ([place]) ?Ascites ?Elevated LFTs ?-Presented with CT evidence of cirrhosis with moderate ascites.  Possibly alcohol induced. ?-Status post ultrasound-guided paracentesis today with removal of 2.6 L fluid.  Currently on IV Rocephin for prophylaxis. ?-Strict input and output.  Daily weights.  Fluid restriction.  Might need to start on diuretics soon. ?-Monitor LFTs ? ?AKI (acute kidney injury) ([place]) ?CKD stage IIIa ?-Presented with creatinine 1.32.  Improving.  Creatinine 1.26 today.  Monitor. ? ?History of embolic stroke ?- Currently stable.  Continue statin.  Eliquis on hold. ? ?HTN (hypertension) ?- Monitor blood pressure.   Continue Cardizem ? ?Atrial fibrillation ([place]) ?Paroxysmal A-fib ?-Continue Cardizem.  Currently rate controlled.  Eliquis on hold. ? ?Mixed hyperlipidemia ?- Continue statin ? ?Gout ?- Stable.  Continue allopurinol ? ?Depression ?- Stable.  Continue sertraline ? ?Acquired hypothyroidism ?- Continue Synthroid ? ? ? ? ?  ? ?Subjective:  ?Patient seen and examined at bedside.  Denies worsening shortness of breath, chest pain, worsening abdominal pain or vomiting. ? ?Physical Exam: ?Vitals:  ? 01/04/22 1455 01/04/22 2017 01/05/22 0609 01/05/22 0734  ?BP: (!) 113/54 114/61 (!) 104/59 (!) 135/50  ?Pulse: 67 67 64 69  ?Resp: 20 19  (!) 21  ?Temp: 98.5 ?F (36.9 ?C) 98.9 ?F (37.2 ?C) 98.2 ?F (36.8 ?C) 98 ?F (36.7 ?C)  ?TempSrc: Oral Oral Oral Tympanic  ?SpO2: 95% 96% 96% 99%  ?Weight:    84.7 kg  ?Height:    '5\' 2"'$  (1.575 m)  ? ?General: On room air.  No distress ?ENT/neck: No thyromegaly.  JVD is not elevated  ?respiratory: Decreased breath sounds at bases bilaterally with some crackles; no wheezing CVS: S1-S2 heard, rate controlled ?Abdominal: Soft, nontender, slightly distended; no organomegaly, normal bowel sounds are heard ?Extremities: Trace lower extremity edema; no cyanosis  ?CNS: Awake and alert.  No focal neurologic deficit.  Moves extremities ?Lymph: No obvious lymphadenopathy ?Skin: No obvious ecchymosis/lesions  ?psych: Affect, judgment and mood are normal  ?musculoskeletal: No obvious joint swelling/deformity ? ?Data Reviewed: ?I have reviewed patient's investigations including blood work and imaging during this hospitalization myself.  Today, sodium is 138, potassium 4, creatinine 1.26, AST 83, ALT 40, total bili of 0.4, hemoglobin 8.8, WBC 6.1 ? ?Family Communication: ?None at bedside ? ?Disposition: ?Status is: Inpatient ?  Remains inpatient appropriate because: Of need for GI procedure today.  Will possibly need to be started on diuretics ? Planned Discharge Destination: Home in 1 to 2 days once clinically  improved and cleared by GI ? ? ? ? ?Author: ?Aline August, MD ?01/05/2022 7:49 AM ? ?For on call review [email or web address].  ?

## 2022-01-05 NOTE — Anesthesia Preprocedure Evaluation (Signed)
Anesthesia Evaluation  ?Patient identified by MRN, date of birth, ID band ?Patient awake ? ? ? ?Reviewed: ?Allergy & Precautions, NPO status , Patient's Chart, lab work & pertinent test results ? ?Airway ?Mallampati: II ? ?TM Distance: >3 FB ?Neck ROM: Full ? ? ? Dental ? ?(+) Dental Advisory Given ?  ?Pulmonary ?neg pulmonary ROS,  ?  ?breath sounds clear to auscultation ? ? ? ? ? ? Cardiovascular ?hypertension, Pt. on medications ?+ dysrhythmias Atrial Fibrillation  ?Rhythm:Regular Rate:Normal ? ? ?  ?Neuro/Psych ?CVA   ? GI/Hepatic ?negative GI ROS, (+) Cirrhosis  ?  ?  ? ,   ?Endo/Other  ?diabetes, Type 2Hypothyroidism  ? Renal/GU ?Renal disease  ? ?  ?Musculoskeletal ? ?(+) Arthritis ,  ? Abdominal ?  ?Peds ? Hematology ? ?(+) Blood dyscrasia, anemia ,   ?Anesthesia Other Findings ? ? Reproductive/Obstetrics ? ?  ? ? ? ? ? ? ? ? ? ? ? ? ? ?  ?  ? ? ? ? ? ? ?Anesthesia Physical ?Anesthesia Plan ? ?ASA: 3 ? ?Anesthesia Plan: MAC  ? ?Post-op Pain Management: Minimal or no pain anticipated  ? ?Induction:  ? ?PONV Risk Score and Plan: 2 and Propofol infusion and Treatment may vary due to age or medical condition ? ?Airway Management Planned: Natural Airway and Nasal Cannula ? ?Additional Equipment:  ? ?Intra-op Plan:  ? ?Post-operative Plan:  ? ?Informed Consent: I have reviewed the patients History and Physical, chart, labs and discussed the procedure including the risks, benefits and alternatives for the proposed anesthesia with the patient or authorized representative who has indicated his/her understanding and acceptance.  ? ? ? ? ? ?Plan Discussed with:  ? ?Anesthesia Plan Comments:   ? ? ? ? ? ?Anesthesia Quick Evaluation ? ?

## 2022-01-05 NOTE — Evaluation (Signed)
Physical Therapy Evaluation ?Patient Details ?Name: Diane Pruitt ?MRN: [id number] ?DOB: [birth year] ?Today's Date: 01/05/2022 ? ?History of Present Illness ? 71 yo female admitted with symptomatic anemia, ascites, AKI, weakness, dyspnea on exertion. Hx of Afib, CVA with L sided weakness, anemia, gout, DM, CKD, dizziness, liver cirrhosis, MDD  ?Clinical Impression ? On eval, pt was Min guard assist for ambulation. She walked ~125 feet without a device. Intermittent unsteadiness noted. Dyspnea 2/4. O2 96% on RA. Discussed d/c plan-pt plans to return home where she lives alone. She reported issues with activity tolerance, higher level balance since CVA. She is agreeable to OP PT f/u-recommendation made. Will plan to follow and progress activity as tolerated.    ?   ? ?Recommendations for follow up therapy are one component of a multi-disciplinary discharge planning process, led by the attending physician.  Recommendations may be updated based on patient status, additional functional criteria and insurance authorization. ? ?Follow Up Recommendations Outpatient PT (for strength, endurance, balance) ? ?  ?Assistance Recommended at Discharge PRN  ?Patient can return home with the following ?   ? ?  ?Equipment Recommendations None recommended by PT  ?Recommendations for Other Services ?    ?  ?Functional Status Assessment Patient has had a recent decline in their functional status and demonstrates the ability to make significant improvements in function in a reasonable and predictable amount of time.  ? ?  ?Precautions / Restrictions Precautions ?Precautions: Fall ?Restrictions ?Weight Bearing Restrictions: No  ? ?  ? ?Mobility ? Bed Mobility ?Overal bed mobility: Modified Independent ?  ?  ?  ?  ?  ?  ?  ?  ? ?Transfers ?Overall transfer level: Modified independent ?  ?  ?  ?  ?  ?  ?  ?  ?  ?  ? ?Ambulation/Gait ?Ambulation/Gait assistance: Min guard ?Gait Distance (Feet): 125 Feet ?Assistive device: None ?Gait  Pattern/deviations: Step-through pattern, Decreased stride length, Drifts right/left ?  ?  ?  ?General Gait Details: Mildly unsteady intermittently. Dyspnea 2/4. O2 96% on RA. ? ?Stairs ?  ?  ?  ?  ?  ? ?Wheelchair Mobility ?  ? ?Modified Rankin (Stroke Patients Only) ?  ? ?  ? ?Balance Overall balance assessment: Mild deficits observed, not formally tested ?  ?  ?  ?  ?  ?  ?  ?  ?  ?  ?  ?  ?  ?  ?  ?  ?  ?  ?   ? ? ? ?Pertinent Vitals/Pain Pain Assessment ?Pain Assessment: No/denies pain  ? ? ?Home Living Family/patient expects to be discharged to:: Private residence ?Living Arrangements: Alone ?Available Help at Discharge: Family;Available PRN/intermittently ?Type of Home: House ?Home Access: Stairs to enter ?Entrance Stairs-Rails: None ?Entrance Stairs-Number of Steps: 1 ?  ?Home Layout: One level ?Home Equipment: None ?   ?  ?Prior Function Prior Level of Function : Independent/Modified Independent ?  ?  ?  ?  ?  ?  ?Mobility Comments: still drives ?  ?  ? ? ?Hand Dominance  ?   ? ?  ?Extremity/Trunk Assessment  ? Upper Extremity Assessment ?Upper Extremity Assessment: Overall WFL for tasks assessed ?  ? ?Lower Extremity Assessment ?Lower Extremity Assessment: Generalized weakness ?  ? ?Cervical / Trunk Assessment ?Cervical / Trunk Assessment: Normal  ?Communication  ? Communication: No difficulties  ?Cognition Arousal/Alertness: Awake/alert ?Behavior During Therapy: [facility] for tasks assessed/performed ?Overall Cognitive Status: Within Functional Limits for tasks assessed ?  ?  ?  ?  ?  ?  ?  ?  ?  ?  ?  ?  ?  ?  ?  ?  ?  ?  ?  ? ?  ?  General Comments   ? ?  ?Exercises    ? ?Assessment/Plan  ?  ?PT Assessment Patient needs continued PT services  ?PT Problem List Decreased strength;Decreased mobility;Decreased activity tolerance;Decreased balance;Decreased knowledge of use of DME ? ?   ?  ?PT Treatment Interventions DME instruction;Gait training;Therapeutic exercise;Balance training;Functional mobility  training;Therapeutic activities;Patient/family education   ? ?PT Goals (Current goals can be found in the Care Plan section)  ?Acute Rehab PT Goals ?Patient Stated Goal: home tomorrow. ?PT Goal Formulation: With patient ?Time For Goal Achievement: 01/19/22 ?Potential to Achieve Goals: Good ? ?  ?Frequency Min 3X/week ?  ? ? ?Co-evaluation   ?  ?  ?  ?  ? ? ?  ?AM-PAC PT "6 Clicks" Mobility  ?Outcome Measure Help needed turning from your back to your side while in a flat bed without using bedrails?: None ?Help needed moving from lying on your back to sitting on the side of a flat bed without using bedrails?: None ?Help needed moving to and from a bed to a chair (including a wheelchair)?: None ?Help needed standing up from a chair using your arms (e.g., wheelchair or bedside chair)?: None ?Help needed to walk in hospital room?: A Little ?Help needed climbing 3-5 steps with a railing? : A Little ?6 Click Score: 22 ? ?  ?End of Session Equipment Utilized During Treatment: Gait belt ?Activity Tolerance: Patient tolerated treatment well ?Patient left: in bed;with call bell/phone within reach;with family/visitor present ?  ?PT Visit Diagnosis: Unsteadiness on feet (R26.81);Muscle weakness (generalized) (M62.81);Difficulty in walking, not elsewhere classified (R26.2) ?  ? ?Time: 8841-6606 ?PT Time Calculation (min) (ACUTE ONLY): 7 min ? ? ?Charges:   PT Evaluation ?$PT Eval Low Complexity: 1 Low ?  ?  ?   ? ? ? ? ? ?Quinnetta Roepke P, PT ?Acute Rehabilitation  ?Office: [phone] ?Pager: [phone] ? ?  ? ?

## 2022-01-05 NOTE — Interval H&P Note (Signed)
History and Physical Interval Note: ?70/female with cirrhosis, anemia, was on eliquis, last dose 01/02/22, for EGD and possible banding if needed. ? ?01/05/2022 ?7:38 AM ? ?Diane Pruitt  has presented today for EGD with possible banding with the diagnosis of anemia, cirrhosis, possible esophageal varices, may need banding.  The various methods of treatment have been discussed with the patient and family. After consideration of risks, benefits and other options for treatment, the patient has consented to  Procedure(s): ?ESOPHAGOGASTRODUODENOSCOPY (EGD) WITH PROPOFOL (N/A) as a surgical intervention.  The patient's history has been reviewed, patient examined, no change in status, stable for surgery.  I have reviewed the patient's chart and labs.  Questions were answered to the patient's satisfaction.   ? ? ?Ronnette Juniper ? ? ?

## 2022-01-05 NOTE — Anesthesia Postprocedure Evaluation (Signed)
Anesthesia Post Note ? ?Patient: Diane Pruitt ? ?Procedure(s) Performed: ESOPHAGOGASTRODUODENOSCOPY (EGD) WITH PROPOFOL ? ?  ? ?Patient location during evaluation: PACU ?Anesthesia Type: MAC ?Level of consciousness: awake and alert ?Pain management: pain level controlled ?Vital Signs Assessment: post-procedure vital signs reviewed and stable ?Respiratory status: spontaneous breathing, nonlabored ventilation, respiratory function stable and patient connected to nasal cannula oxygen ?Cardiovascular status: stable and blood pressure returned to baseline ?Postop Assessment: no apparent nausea or vomiting ?Anesthetic complications: no ? ? ?No notable events documented. ? ?Last Vitals:  ?Vitals:  ? 01/05/22 0850 01/05/22 1227  ?BP: (!) 114/50 (!) 102/51  ?Pulse: 65 66  ?Resp: 15 16  ?Temp:  36.8 ?C  ?SpO2: 98% 99%  ?  ?Last Pain:  ?Vitals:  ? 01/05/22 1227  ?TempSrc: Oral  ?PainSc:   ? ? ?  ?  ?  ?  ?  ?  ? ?Suzette Battiest E ? ? ? ? ?

## 2022-01-05 NOTE — Progress Notes (Signed)
PT Cancellation Note ? ?Patient Details ?Name: Diane Pruitt ?MRN: [id number] ?DOB: [birth year] ? ? ?Cancelled Treatment:    Reason Eval/Treat Not Completed: Patient at procedure or test/unavailable. Will check back as schedule allows. ? ? ? ?Doreatha Massed, PT ?Acute Rehabilitation  ?Office: [phone] ?Pager: [phone] ? ?  ? ? ?

## 2022-01-05 NOTE — Op Note (Signed)
[facility] ?Patient Name: Diane Pruitt ?Procedure Date: 01/05/2022 ?MRN: [id number] ?Attending MD: Ronnette Juniper , MD ?Date of Birth: 1951-09-25 ?CSN: [id number] ?Age: 71 ?Admit Type: Inpatient ?Procedure:                Upper GI endoscopy ?Indications:              Unexplained iron deficiency anemia, Cirrhosis rule  ?                          out esophageal varices ?Providers:                Ronnette Juniper, MD, Burtis Junes, RN, Janie Billups,  ?                          Technician ?Referring MD:             [facility] ?Medicines:                Monitored Anesthesia Care ?Complications:            No immediate complications. Estimated blood loss:  ?                          Minimal. ?Estimated Blood Loss:     Estimated blood loss was minimal. ?Procedure:                Pre-Anesthesia Assessment: ?                          - Prior to the procedure, a History and Physical  ?                          was performed, and patient medications and  ?                          allergies were reviewed. The patient's tolerance of  ?                          previous anesthesia was also reviewed. The risks  ?                          and benefits of the procedure and the sedation  ?                          options and risks were discussed with the patient.  ?                          All questions were answered, and informed consent  ?                          was obtained. Prior Anticoagulants: The patient has  ?                          taken Eliquis (apixaban), last dose was 2 days  ?                          prior to  procedure. ASA Grade Assessment: III - A  ?                          patient with severe systemic disease. After  ?                          reviewing the risks and benefits, the patient was  ?                          deemed in satisfactory condition to undergo the  ?                          procedure. ?                          After obtaining informed consent, the endoscope was  ?                           passed under direct vision. Throughout the  ?                          procedure, the patient's blood pressure, pulse, and  ?                          oxygen saturations were monitored continuously. The  ?                          GIF-H190 ([id number]) Olympus endoscope was introduced  ?                          through the mouth, and advanced to the second part  ?                          of duodenum. The upper GI endoscopy was  ?                          accomplished without difficulty. The patient  ?                          tolerated the procedure well. ?Scope In: ?Scope Out: ?Findings: ?     The examined esophagus was normal. ?     There were no esophageal varices noted. ?     Mild portal hypertensive gastropathy was found in the entire examined  ?     stomach. ?     The cardia and gastric fundus were normal on retroflexion. There was no  ?     evidence of gastric varices. ?     Localized moderate mucosal changes characterized by thickened fold were  ?     found in the gastric antrum. Biopsies were taken with a cold forceps for  ?     histology. ?     Three 3 to 4 mm sessile polyps were found in the duodenal bulb and in  ?     the first portion of the duodenum. The polyp was removed with a  ?     piecemeal technique using a  cold biopsy forceps. Resection and retrieval  ?     were complete. ?Impression:               - Normal esophagus. ?                          - Portal hypertensive gastropathy. ?                          - Thickened fold mucosa in the antrum. Biopsied. ?                          - Three duodenal polyps. Resected and retrieved. ?Moderate Sedation: ?     Patient did not receive moderate sedation for this procedure, but  ?     instead received monitored anesthesia care. ?Recommendation:           - Low sodium diet. ?                          - Continue present medications. ?                          - Await pathology results. ?                          - Repeat upper endoscopy in 1  year for screening  ?                          purposes for esophageal varices. ?Procedure Code(s):        --- Professional --- ?                          [id number], Esophagogastroduodenoscopy, flexible,  ?                          transoral; with biopsy, single or multiple ?Diagnosis Code(s):        --- Professional --- ?                          K76.6, Portal hypertension ?                          K31.89, Other diseases of stomach and duodenum ?                          K31.7, Polyp of stomach and duodenum ?                          D50.9, Iron deficiency anemia, unspecified ?                          K74.60, Unspecified cirrhosis of liver ?CPT copyright 2019 [employer]. All rights reserved. ?The codes documented in this report are preliminary and upon coder review may  ?be revised to meet current compliance requirements. ?Ronnette Juniper, MD ?01/05/2022 8:38:07 AM ?This report has been signed electronically. ?Number of Addenda: 0 ?

## 2022-01-05 NOTE — Transfer of Care (Signed)
Immediate Anesthesia Transfer of Care Note ? ?Patient: Diane Pruitt ? ?Procedure(s) Performed: Procedure(s): ?ESOPHAGOGASTRODUODENOSCOPY (EGD) WITH PROPOFOL (N/A) ? ?Patient Location: PACU and Endoscopy Unit ? ?Anesthesia Type:MAC ? ?Level of Consciousness: awake, alert  and oriented ? ?Airway & Oxygen Therapy: Patient Spontanous Breathing and Patient connected to nasal cannula oxygen ? ?Post-op Assessment: Report given to RN and Post -op Vital signs reviewed and stable ? ?Post vital signs: Reviewed and stable ? ?Last Vitals:  ?Vitals:  ? 01/05/22 0734 01/05/22 0837  ?BP: (!) 135/50   ?Pulse: 69 68  ?Resp: (!) 21 15  ?Temp: 36.7 ?C 36.7 ?C  ?SpO2: 99% 100%  ? ? ?Complications: No apparent anesthesia complications ? ?

## 2022-01-06 DIAGNOSIS — N179 Acute kidney failure, unspecified: Secondary | ICD-10-CM | POA: Diagnosis not present

## 2022-01-06 DIAGNOSIS — K922 Gastrointestinal hemorrhage, unspecified: Secondary | ICD-10-CM | POA: Diagnosis not present

## 2022-01-06 DIAGNOSIS — I480 Paroxysmal atrial fibrillation: Secondary | ICD-10-CM | POA: Diagnosis not present

## 2022-01-06 DIAGNOSIS — E876 Hypokalemia: Secondary | ICD-10-CM

## 2022-01-06 DIAGNOSIS — Z8673 Personal history of transient ischemic attack (TIA), and cerebral infarction without residual deficits: Secondary | ICD-10-CM | POA: Diagnosis not present

## 2022-01-06 DIAGNOSIS — D649 Anemia, unspecified: Secondary | ICD-10-CM | POA: Diagnosis not present

## 2022-01-06 DIAGNOSIS — R188 Other ascites: Secondary | ICD-10-CM | POA: Diagnosis not present

## 2022-01-06 DIAGNOSIS — D62 Acute posthemorrhagic anemia: Secondary | ICD-10-CM | POA: Diagnosis not present

## 2022-01-06 DIAGNOSIS — K7460 Unspecified cirrhosis of liver: Secondary | ICD-10-CM | POA: Diagnosis not present

## 2022-01-06 LAB — AFP TUMOR MARKER: AFP, Serum, Tumor Marker: <1.8 ng/mL (ref 0.0–9.2)

## 2022-01-06 LAB — CBC WITH DIFFERENTIAL/PLATELET
Abs Immature Granulocytes: 0.01 10*3/uL (ref 0.00–0.07)
Basophils Absolute: 0 10*3/uL (ref 0.0–0.1)
Basophils Relative: 1 %
Eosinophils Absolute: 0.2 10*3/uL (ref 0.0–0.5)
Eosinophils Relative: 4 %
HCT: 26.9 % — ABNORMAL LOW (ref 36.0–46.0)
Hemoglobin: 8 g/dL — ABNORMAL LOW (ref 12.0–15.0)
Immature Granulocytes: 0 %
Lymphocytes Relative: 21 %
Lymphs Abs: 1.1 10*3/uL (ref 0.7–4.0)
MCH: 26 pg (ref 26.0–34.0)
MCHC: 29.7 g/dL — ABNORMAL LOW (ref 30.0–36.0)
MCV: 87.3 fL (ref 80.0–100.0)
Monocytes Absolute: 0.5 10*3/uL (ref 0.1–1.0)
Monocytes Relative: 9 %
Neutro Abs: 3.4 10*3/uL (ref 1.7–7.7)
Neutrophils Relative %: 65 %
Platelets: 137 10*3/uL — ABNORMAL LOW (ref 150–400)
RBC: 3.08 MIL/uL — ABNORMAL LOW (ref 3.87–5.11)
RDW: 17.2 % — ABNORMAL HIGH (ref 11.5–15.5)
WBC: 5.2 10*3/uL (ref 4.0–10.5)
nRBC: 0 % (ref 0.0–0.2)

## 2022-01-06 LAB — COMPREHENSIVE METABOLIC PANEL
ALT: 43 U/L (ref 0–44)
AST: 100 U/L — ABNORMAL HIGH (ref 15–41)
Albumin: 3.1 g/dL — ABNORMAL LOW (ref 3.5–5.0)
Alkaline Phosphatase: 58 U/L (ref 38–126)
Anion gap: 10 (ref 5–15)
BUN: 12 mg/dL (ref 8–23)
CO2: 24 mmol/L (ref 22–32)
Calcium: 7.8 mg/dL — ABNORMAL LOW (ref 8.9–10.3)
Chloride: 105 mmol/L (ref 98–111)
Creatinine, Ser: 1.3 mg/dL — ABNORMAL HIGH (ref 0.44–1.00)
GFR, Estimated: 44 mL/min — ABNORMAL LOW (ref >60)
Glucose, Bld: 89 mg/dL (ref 70–99)
Potassium: 3.2 mmol/L — ABNORMAL LOW (ref 3.5–5.1)
Sodium: 139 mmol/L (ref 135–145)
Total Bilirubin: 0.3 mg/dL (ref 0.3–1.2)
Total Protein: 6.3 g/dL — ABNORMAL LOW (ref 6.5–8.1)

## 2022-01-06 LAB — ALPHA-1-ANTITRYPSIN: A-1 Antitrypsin, Ser: 216 mg/dL — ABNORMAL HIGH (ref 101–187)

## 2022-01-06 LAB — GLUCOSE, CAPILLARY
Glucose-Capillary: 85 mg/dL (ref 70–99)
Glucose-Capillary: 97 mg/dL (ref 70–99)

## 2022-01-06 LAB — CERULOPLASMIN: Ceruloplasmin: 8 mg/dL — ABNORMAL LOW (ref 19.0–39.0)

## 2022-01-06 LAB — MAGNESIUM: Magnesium: 1.9 mg/dL (ref 1.7–2.4)

## 2022-01-06 MED ORDER — SPIRONOLACTONE 50 MG PO TABS: 50.0000 mg | ORAL_TABLET | Freq: Every day | ORAL | 0 refills | Status: DC

## 2022-01-06 MED ORDER — PANTOPRAZOLE SODIUM 40 MG PO TBEC: 40.0000 mg | DELAYED_RELEASE_TABLET | Freq: Every day | ORAL | 0 refills | Status: DC

## 2022-01-06 MED ORDER — FUROSEMIDE 20 MG PO TABS: 20.0000 mg | ORAL_TABLET | Freq: Every day | ORAL | 0 refills | Status: DC

## 2022-01-06 MED ORDER — ELIQUIS 5 MG PO TABS
5.0000 mg | ORAL_TABLET | Freq: Two times a day (BID) | ORAL | Status: DC
Start: 2022-01-07 — End: 2022-01-26

## 2022-01-06 MED ORDER — POTASSIUM CHLORIDE CRYS ER 20 MEQ PO TBCR
40.0000 meq | EXTENDED_RELEASE_TABLET | ORAL | Status: DC
  Filled 2022-01-06: qty 2

## 2022-01-06 NOTE — Discharge Summary (Addendum)
Physician Discharge Summary   Patient: Diane Pruitt MRN: [id number] DOB: [birth year]  Admit date:     01/03/2022  Discharge date: 01/06/22  Discharge Physician: Aline August   PCP: [name], MD   Recommendations at discharge:   Follow-up with PCP within a week with repeat CBC/CMP Outpatient follow-up with GI Follow-up in the ED if symptoms worsen or new appear   Hospital Course: 71 y.o. female with medical history significant of depression, unspecified anemia, unspecified CVA, hyperlipidemia, gout, diabetes mellitus type 2, CKD stage III, hypertension, paroxysmal atrial fibrillation on Eliquis, hypothyroidism, Barrett's esophagus presented with lightheadedness, abdominal distention and anemia along with intermittent dark stools.  She was seen at her PCPs office and was found to have hemoglobin of 7.9, dropped from her prior hemoglobin of 12 and subsequently 9.8.  On presentation, CT of the abdomen and pelvis showed hepatic cirrhosis with mild splenomegaly and moderate ascites.  She was started on IV PPI, Rocephin and vitamin K.  GI was consulted.  She underwent paracentesis and removal of 2.6 L fluid on 01/04/2022 by IR.  She underwent EGD on 01/05/2022 which showed portal hypertensive gastropathy; few duodenal polyps which were resected and retrieved.  Subsequently, hemoglobin has remained stable and she has tolerated diet.  GI has cleared the patient for discharge.  She will be discharged home today on oral diuretics with outpatient follow-up with PCP and GI.  Discharge diagnosis and assessment and Plan: * Symptomatic anemia Acute blood loss anemia Possible acute GI bleeding -Presented with hemoglobin of 7.9.  Her hemoglobin recently dropped from 12-9.8 -Subsequent hemoglobin was 7.4.  Status post 1 unit packed red cell transfusion.  Hemoglobin 8 today -Currently on IV Protonix -Status post EGD on 01/05/2022 which showed portal hypertensive gastropathy; 3 duodenal polyps which were  resected and retrieved.   -Subsequently, hemoglobin has remained stable and she has tolerated diet.  GI has cleared the patient for discharge.  She will be discharged home today on oral diuretics with outpatient follow-up with PCP and GI.  GI recommends to restart Eliquis from 01/07/2022 onwards.  Cirrhosis of liver (HCC) Ascites Elevated LFTs -Presented with CT evidence of cirrhosis with moderate ascites.  Possibly alcohol induced. -Status post ultrasound-guided paracentesis today with removal of 2.6 L fluid.  Currently on IV Rocephin for prophylaxis. -Continue diet and fluid restriction. -Monitor LFTs as an outpatient -Continue Lasix and spironolactone upon discharge: Were started on 01/05/2022 by GI.  Outpatient follow-up with GI.  AKI (acute kidney injury) ([place]) CKD stage IIIa -Presented with creatinine 1.32.  Improving.  Creatinine 1.3 today.  Outpatient follow-up.  History of embolic stroke - Currently stable.  Continue statin.  Eliquis plan as above.  HTN (hypertension) - Outpatient follow-up.  Continue Cardizem, Lasix and spironolactone  Atrial fibrillation (HCC) Paroxysmal A-fib -Continue Cardizem.  Currently rate controlled.  Eliquis plan as above  Mixed hyperlipidemia - Continue statin  Gout - Stable.  Continue allopurinol  Hypokalemia - Replace prior to discharge.  Outpatient follow-up  Depression - Stable.  Continue sertraline  Acquired hypothyroidism - Continue Synthroid  Diabetes mellitus type 2 -Possibly diet controlled.  Carb modified diet.  Outpatient follow-up.  Consultants: GI Procedures performed: EGD on 01/05/2022.  Paracentesis on 01/04/2022 Disposition: Home Diet recommendation:  Discharge Diet Orders (From admission, onward)     Start     Ordered   01/06/22 0000  Diet - low sodium heart healthy        01/06/22 1130   01/06/22 0000  Diet Carb  Modified        01/06/22 1130           Cardiac and Carb modified diet/fluid restriction of up  to 1200 cc a day DISCHARGE MEDICATION: Allergies as of 01/06/2022       Reactions   Colchicine Other (See Comments)   Stomach upset        Medication List     STOP taking these medications    promethazine 12.5 MG tablet Commonly known as: PHENERGAN       TAKE these medications    alendronate 70 MG tablet Commonly known as: FOSAMAX Take 70 mg by mouth once a week.   allopurinol 100 MG tablet Commonly known as: ZYLOPRIM Take 200 mg by mouth daily.   amiodarone 200 MG tablet Commonly known as: PACERONE Take 200 mg by mouth in the morning and at bedtime.   diltiazem 120 MG 24 hr capsule Commonly known as: CARDIZEM CD Take 120 mg by mouth daily.   Eliquis 5 MG Tabs tablet Generic drug: apixaban Take 1 tablet (5 mg total) by mouth 2 (two) times daily. Resume from 01/07/2022 Start taking on: January 07, 2022 What changed: additional instructions   ezetimibe 10 MG tablet Commonly known as: ZETIA Take 10 mg by mouth at bedtime.   ferrous sulfate 325 (65 FE) MG tablet Take 325 mg by mouth daily.   furosemide 20 MG tablet Commonly known as: LASIX Take 1 tablet (20 mg total) by mouth daily. Start taking on: January 07, 2022   levothyroxine 200 MCG tablet Commonly known as: SYNTHROID Take 200 mcg by mouth every morning.   metFORMIN 500 MG tablet Commonly known as: GLUCOPHAGE Take 500 mg by mouth 2 (two) times daily.   multivitamin tablet Take 1 tablet by mouth daily.   pantoprazole 40 MG tablet Commonly known as: Protonix Take 1 tablet (40 mg total) by mouth daily.   PRESERVISION AREDS 2 PO Take 1 capsule by mouth daily.   rosuvastatin 40 MG tablet Commonly known as: CRESTOR Take 40 mg by mouth daily.   sertraline 50 MG tablet Commonly known as: ZOLOFT Take 50 mg by mouth daily.   spironolactone 50 MG tablet Commonly known as: ALDACTONE Take 1 tablet (50 mg total) by mouth daily. Start taking on: January 07, 2022   Vitamin D 50 MCG (2000 UT)  tablet Take 2,000 Units by mouth daily.      Subjective:  Patient seen and examined at bedside.  No worsening abdominal pain, fever, vomiting or shortness of breath.    Discharge Exam: Filed Weights   01/04/22 0500 01/05/22 0734 01/06/22 0634  Weight: 84.7 kg 84.7 kg 82.3 kg   General: No acute distress, currently on room air respiratory: Bilateral decreased breath sounds at bases with some scattered crackles CVS: S1-S2 heard, rate controlled Abdominal: Soft, nontender, mildly distended; no organomegaly, bowel sounds heard Extremities: No cyanosis, clubbing; mild lower extremity edema present   Condition at discharge: good  The results of significant diagnostics from this hospitalization (including imaging, microbiology, ancillary and laboratory) are listed below for reference.   Imaging Studies: CT ABDOMEN PELVIS W CONTRAST  Result Date: 01/03/2022 CLINICAL DATA:  Lower abdominal pain. EXAM: CT ABDOMEN AND PELVIS WITH CONTRAST TECHNIQUE: Multidetector CT imaging of the abdomen and pelvis was performed using the standard protocol following bolus administration of intravenous contrast. RADIATION DOSE REDUCTION: This exam was performed according to the departmental dose-optimization program which includes automated exposure control, adjustment of the mA and/or kV  according to patient size and/or use of iterative reconstruction technique. CONTRAST:  86m OMNIPAQUE IOHEXOL 300 MG/ML  SOLN COMPARISON:  None. FINDINGS: Lower chest: No acute abnormality. Hepatobiliary: No gallstones or biliary dilatation is noted. Nodular hepatic contours are noted consistent with hepatic cirrhosis. No focal hepatic abnormality is noted. Pancreas: Unremarkable. No pancreatic ductal dilatation or surrounding inflammatory changes. Spleen: Mild splenomegaly is noted. Adrenals/Urinary Tract: Adrenal glands are unremarkable. Kidneys are normal, without renal calculi, focal lesion, or hydronephrosis. Bladder is  unremarkable. Stomach/Bowel: Stomach is within normal limits. Appendix appears normal. No evidence of bowel wall thickening, distention, or inflammatory changes. Sigmoid diverticulosis is noted without inflammation. Vascular/Lymphatic: Aortic atherosclerosis. No enlarged abdominal or pelvic lymph nodes. Reproductive: Uterus and bilateral adnexa are unremarkable. Other: Moderate ascites is noted.  No hernia is noted. Musculoskeletal: Status post L2 kyphoplasty. No acute osseous abnormality is noted. IMPRESSION: Hepatic cirrhosis is noted with mild splenomegaly and moderate ascites. Sigmoid diverticulosis without inflammation. Aortic Atherosclerosis (ICD10-I70.0). Electronically Signed   By: JMarijo ConceptionM.D.   On: 01/03/2022 15:17   [place]acent[place]  Result Date: 01/04/2022 INDICATION: Ascites, abdominal pain, cirrhosis EXAM: ULTRASOUND GUIDED RLQ PARACENTESIS MEDICATIONS: None. COMPLICATIONS: None immediate. PROCEDURE: Informed written consent was obtained from the patient after a discussion of the risks, benefits and alternatives to treatment. A timeout was performed prior to the initiation of the procedure. Initial ultrasound scanning demonstrates a large amount of ascites within the right lower abdominal quadrant. The right lower abdomen was prepped and draped in the usual sterile fashion. 1% lidocaine was used for local anesthesia. Following this, a 19 gauge, 7-cm, Yueh catheter was introduced. An ultrasound image was saved for documentation purposes. The paracentesis was performed. The catheter was removed and a dressing was applied. The patient tolerated the procedure well without immediate post procedural complication. FINDINGS: A total of approximately 2.6L of yellow peritoneal fluid was removed. Samples were sent to the laboratory as requested by the clinical team. IMPRESSION: Successful ultrasound-guided paracentesis yielding 2.6 liters of peritoneal fluid. Electronically Signed   By: DLucrezia EuropeM.D.    On: 01/04/2022 11:02   DG Foot 2 Views Left  Result Date: 12/20/2021 Please see detailed radiograph report in office note.   Microbiology: Results for orders placed or performed during the hospital encounter of 01/03/22  Resp Panel by RT-PCR (Flu A&B, Covid) Nasopharyngeal Swab     Status: None   Collection Time: 01/03/22  5:47 PM   Specimen: Nasopharyngeal Swab; Nasopharyngeal(NP) swabs in vial transport medium  Result Value Ref Range Status   SARS Coronavirus 2 by RT PCR NEGATIVE NEGATIVE Final    Comment: (NOTE) SARS-CoV-2 target nucleic acids are NOT DETECTED.  The SARS-CoV-2 RNA is generally detectable in upper respiratory specimens during the acute phase of infection. The lowest concentration of SARS-CoV-2 viral copies this assay can detect is 138 copies/mL. A negative result does not preclude SARS-Cov-2 infection and should not be used as the sole basis for treatment or other patient management decisions. A negative result may occur with  improper specimen collection/handling, submission of specimen other than nasopharyngeal swab, presence of viral mutation(s) within the areas targeted by this assay, and inadequate number of viral copies(<138 copies/mL). A negative result must be combined with clinical observations, patient history, and epidemiological information. The expected result is Negative.  Fact Sheet for Patients:  hEntrepreneurPulse.com.au Fact Sheet for Healthcare Providers:  hIncredibleEmployment.be This test is no t yet approved or cleared by the [place]  has been authorized for detection and/or diagnosis of SARS-CoV-2 by FDA under an Emergency Use Authorization (EUA). This EUA will remain  in effect (meaning this test can be used) for the duration of the COVID-19 declaration under Section 564(b)(1) of the Act, 21 U.S.C.section 360bbb-3(b)(1), unless the authorization is terminated  or revoked sooner.        Influenza A by PCR NEGATIVE NEGATIVE Final   Influenza B by PCR NEGATIVE NEGATIVE Final    Comment: (NOTE) The Xpert Xpress SARS-CoV-2/FLU/RSV plus assay is intended as an aid in the diagnosis of influenza from Nasopharyngeal swab specimens and should not be used as a sole basis for treatment. Nasal washings and aspirates are unacceptable for Xpert Xpress SARS-CoV-2/FLU/RSV testing.  Fact Sheet for Patients: EntrepreneurPulse.com.au  Fact Sheet for Healthcare Providers: IncredibleEmployment.be  This test is not yet approved or cleared by the [place] FDA and has been authorized for detection and/or diagnosis of SARS-CoV-2 by FDA under an Emergency Use Authorization (EUA). This EUA will remain in effect (meaning this test can be used) for the duration of the COVID-19 declaration under Section 564(b)(1) of the Act, 21 U.S.C. section 360bbb-3(b)(1), unless the authorization is terminated or revoked.  Performed at [facility], [address] [place], [place] [address]   Culture, body fluid w Gram Stain-bottle     Status: None (Preliminary result)   Collection Time: 01/04/22  8:57 AM   Specimen: Peritoneal Washings  Result Value Ref Range Status   Specimen Description PERITONEAL FLUID  Final   Special Requests NONE  Final   Culture   Final    NO GROWTH 2 DAYS Performed at [facility], [address] [place], [place] [address]    Report Status PENDING  Incomplete  Gram stain     Status: None   Collection Time: 01/04/22  8:57 AM   Specimen: Peritoneal Washings  Result Value Ref Range Status   Specimen Description PERITONEAL FLUID  Final   Special Requests NONE  Final   Gram Stain   Final    WBC PRESENT,BOTH PMN AND MONONUCLEAR NO ORGANISMS SEEN CYTOSPIN SMEAR Performed at [facility], [address] [place], [place] [address]    Report Status 01/04/2022 FINAL  Final     Labs: CBC: Recent Labs  Lab 01/03/22 1355 01/03/22 2131 01/04/22 0732 01/05/22 0517 01/06/22 0704  WBC 7.3 6.0 6.1 6.1 5.2  NEUTROABS 5.3  --   --  4.1 3.4  HGB 7.9* 7.4* 8.2* 8.8* 8.0*  HCT 26.6* 24.6* 26.5* 27.7* 26.9*  MCV 87.5 87.9 86.0 86.0 87.3  PLT 186 167 148* 174 469*   Basic Metabolic Panel: Recent Labs  Lab 01/03/22 1355 01/04/22 0732 01/05/22 0517 01/06/22 0704  NA 137 134* 138 139  K 3.8 3.7 4.0 3.2*  CL 104 105 106 105  CO2 '22 22 24 24  '$ GLUCOSE 99 86 96 89  BUN '15 15 13 12  '$ CREATININE 1.32* 1.06* 1.26* 1.30*  CALCIUM 8.8* 7.9* 8.2* 7.8*  MG  --   --  2.1 1.9   Liver Function Tests: Recent Labs  Lab 01/03/22 1355 01/04/22 0732 01/05/22 0517 01/06/22 0704  AST 82* 75* 83* 100*  ALT 41 38 40 43  ALKPHOS 68 61 63 58  BILITOT 0.7 1.3* 0.4 0.3  PROT 6.5 5.8* 6.1* 6.3*  ALBUMIN 3.4* 2.5* 2.6* 3.1*   CBG: Recent Labs  Lab 01/04/22 2019 01/04/22 2130 01/05/22 1728 01/05/22 2129 01/06/22 0734  GLUCAP 96 94 115* 96 85    Discharge time spent: greater than 30 minutes.  Signed: Aline August, MD [facility] 01/06/2022

## 2022-01-06 NOTE — Progress Notes (Signed)
?Progress Note ? ? ?Patient: Diane Pruitt ZOX:[id number] DOB: 02-Aug-1951 DOA: 01/03/2022     2 ?DOS: the patient was seen and examined on 01/06/2022 ?  ?Brief hospital course: ?71 y.o. female with medical history significant of depression, unspecified anemia, unspecified CVA, hyperlipidemia, gout, diabetes mellitus type 2, CKD stage III, hypertension, paroxysmal atrial fibrillation on Eliquis, hypothyroidism, Barrett's esophagus presented with lightheadedness, abdominal distention and anemia along with intermittent dark stools.  She was seen at her PCPs office and was found to have hemoglobin of 7.9, dropped from her prior hemoglobin of 12 and subsequently 9.8.  On presentation, CT of the abdomen and pelvis showed hepatic cirrhosis with mild splenomegaly and moderate ascites.  She was started on IV PPI, Rocephin and vitamin K.  GI was consulted.  She underwent paracentesis and removal of 2.6 L fluid on 01/04/2022 by IR.  She underwent EGD on 01/05/2022 which showed portal hypertensive gastropathy; few duodenal polyps which were resected and retrieved. ? ?Assessment and Plan: ?Symptomatic anemia ?Acute blood loss anemia ?Possible acute GI bleeding ?-Presented with hemoglobin of 7.9.  Her hemoglobin recently dropped from 12-9.8 ?-Subsequent hemoglobin was 7.4.  Status post 1 unit packed red cell transfusion.  Labs pending for today.  Monitor H&H. ?-Continue IV Protonix ?-Status post EGD on 01/05/2022 which showed portal hypertensive gastropathy; 3 duodenal polyps which were resected and retrieved.  Follow further GI recommendations regarding resumption of Eliquis. ? ?Cirrhosis of liver ([place]) ?Ascites ?Elevated LFTs ?-Presented with CT evidence of cirrhosis with moderate ascites.  Possibly alcohol induced. ?-Status post ultrasound-guided paracentesis today with removal of 2.6 L fluid.  Currently on IV Rocephin for prophylaxis. ?-Strict input and output.  Daily weights.  Fluid restriction.  Patient was started on Lasix and  spironolactone by GI on 01/05/2022. ?-Monitor LFTs ? ?AKI (acute kidney injury) ([place]) ?CKD stage IIIa ?-Presented with creatinine 1.32.  Improving.  Creatinine 1.3 today.  Monitor. ? ?History of embolic stroke ?- Currently stable.  Continue statin.  Eliquis on hold. ? ?HTN (hypertension) ?- Monitor blood pressure.  Continue Cardizem ? ?Atrial fibrillation ([place]) ?Paroxysmal A-fib ?-Continue Cardizem.  Currently rate controlled.  Eliquis on hold. ? ?Mixed hyperlipidemia ?- Continue statin ? ?Gout ?- Stable.  Continue allopurinol ? ?Depression ?- Stable.  Continue sertraline ? ?Acquired hypothyroidism ?- Continue Synthroid ? ? ? ?  ? ?Subjective:  ?Patient seen and examined at bedside.  No worsening abdominal pain, fever, vomiting or shortness of breath. ? ?Physical Exam: ?Vitals:  ? 01/05/22 0850 01/05/22 1227 01/05/22 2127 01/06/22 0634  ?BP: (!) 114/50 (!) 102/51 (!) 117/49 118/61  ?Pulse: 65 66 72 71  ?Resp: '15 16 17 17  '$ ?Temp:  98.3 ?F (36.8 ?C) 97.7 ?F (36.5 ?C) (!) 97.4 ?F (36.3 ?C)  ?TempSrc:  Oral Oral Oral  ?SpO2: 98% 99% 100% 97%  ?Weight:    82.3 kg  ?Height:      ? ?General: No acute distress, currently on room air ?ENT/neck: No elevated JVD.  No obvious masses  ?respiratory: Bilateral decreased breath sounds at bases with some scattered crackles ?CVS: S1-S2 heard, rate controlled ?Abdominal: Soft, nontender, mildly distended; no organomegaly, bowel sounds heard ?Extremities: No cyanosis, clubbing; mild lower extremity edema present  ?CNS: Alert, awake and oriented.  No focal neurologic deficit.  Moving extremities. ?Lymph: No cervical lymphadenopathy ?Skin: No rashes, lesions, ulcers ?Psych: Normal mood, affect and judgment ?Musculoskeletal: No obvious joint deformity/tenderness/swelling ? ?Data Reviewed: ?I have reviewed patient's investigations including blood work and imaging during this hospitalization  myself.  Today, labs are pending ? ?Family Communication: Spoke to daughter on phone on  01/05/2022 ? ?Disposition: ?Status is: Inpatient ?Remains inpatient appropriate because: Of severity of illness ? Planned Discharge Destination: Home in 1 to 2 days once cleared by GI and if remains hemodynamically stable ? ? ? ?Author: ?Aline August, MD ?01/06/2022 7:57 AM ? ?For on call review [email or web address].  ?

## 2022-01-06 NOTE — Assessment & Plan Note (Signed)
-   Replace prior to discharge.  Outpatient follow-up ?

## 2022-01-06 NOTE — Progress Notes (Signed)
Subjective: Patient states her bowel movements are normal, denies noticing blood in stool or black stools. Denies abdominal distention or swelling in extremities.  Objective: Vital signs in last 24 hours: Temp:  [97.4 F (36.3 C)-98.3 F (36.8 C)] 97.4 F (36.3 C) (03/11 0634) Pulse Rate:  [66-72] 71 (03/11 0634) Resp:  [16-17] 17 (03/11 0634) BP: (102-118)/(49-61) 118/61 (03/11 0634) SpO2:  [97 %-100 %] 97 % (03/11 0634) Weight:  [82.3 kg] 82.3 kg (03/11 0634) Weight change:  Last BM Date : 01/05/22  PE: Slightly overweight, no icterus, mild pallor GENERAL: Alert, no evidence of asterixis  ABDOMEN: Soft, slightly distended, no shifting dullness or fluid thrill, normoactive bowel sounds EXTREMITIES: No pedal edema  Lab Results: Results for orders placed or performed during the hospital encounter of 01/03/22 (from the past 48 hour(s))  Glucose, capillary     Status: Abnormal   Collection Time: 01/04/22 12:06 PM  Result Value Ref Range   Glucose-Capillary 105 (H) 70 - 99 mg/dL    Comment: Glucose reference range applies only to samples taken after fasting for at least 8 hours.  Glucose, capillary     Status: Abnormal   Collection Time: 01/04/22  4:40 PM  Result Value Ref Range   Glucose-Capillary 102 (H) 70 - 99 mg/dL    Comment: Glucose reference range applies only to samples taken after fasting for at least 8 hours.  Glucose, capillary     Status: None   Collection Time: 01/04/22  8:19 PM  Result Value Ref Range   Glucose-Capillary 96 70 - 99 mg/dL    Comment: Glucose reference range applies only to samples taken after fasting for at least 8 hours.  Glucose, capillary     Status: None   Collection Time: 01/04/22  9:30 PM  Result Value Ref Range   Glucose-Capillary 94 70 - 99 mg/dL    Comment: Glucose reference range applies only to samples taken after fasting for at least 8 hours.  Ceruloplasmin     Status: Abnormal   Collection Time: 01/05/22  5:17 AM  Result Value Ref  Range   Ceruloplasmin 8.0 (L) 19.0 - 39.0 mg/dL    Comment: (NOTE) Performed At: [facility] [place], [place] [id number] Rush Farmer MD [id number]   Alpha-1-antitrypsin     Status: Abnormal   Collection Time: 01/05/22  5:17 AM  Result Value Ref Range   A-1 Antitrypsin, Ser 216 (H) 101 - 187 mg/dL    Comment: (NOTE) Performed At: [facility] [place], [place] [id number] Rush Farmer MD [id number]   AFP tumor marker     Status: None   Collection Time: 01/05/22  5:17 AM  Result Value Ref Range   AFP, Serum, Tumor Marker <1.8 0.0 - 9.2 ng/mL    Comment: (NOTE) [employer] Electrochemiluminescence Immunoassay (ECLIA) Values obtained with different assay methods or kits cannot be used interchangeably.  Results cannot be interpreted as absolute evidence of the presence or absence of malignant disease. This test is not interpretable in pregnant females. Performed At: [facility] [place], [place] [id number] Rush Farmer MD [id number]   CBC with Differential/Platelet     Status: Abnormal   Collection Time: 01/05/22  5:17 AM  Result Value Ref Range   WBC 6.1 4.0 - 10.5 K/uL   RBC 3.22 (L) 3.87 - 5.11 MIL/uL   Hemoglobin 8.8 (L) 12.0 - 15.0 g/dL   HCT 27.7 (L) 36.0 - 46.0 %   MCV  86.0 80.0 - 100.0 fL   MCH 27.3 26.0 - 34.0 pg   MCHC 31.8 30.0 - 36.0 g/dL   RDW 17.3 (H) 11.5 - 15.5 %   Platelets 174 150 - 400 K/uL   nRBC 0.0 0.0 - 0.2 %   Neutrophils Relative % 68 %   Neutro Abs 4.1 1.7 - 7.7 K/uL   Lymphocytes Relative 20 %   Lymphs Abs 1.2 0.7 - 4.0 K/uL   Monocytes Relative 8 %   Monocytes Absolute 0.5 0.1 - 1.0 K/uL   Eosinophils Relative 3 %   Eosinophils Absolute 0.2 0.0 - 0.5 K/uL   Basophils Relative 1 %   Basophils Absolute 0.1 0.0 - 0.1 K/uL   Immature Granulocytes 0 %   Abs Immature Granulocytes 0.02 0.00 - 0.07 K/uL    Comment: Performed at [facility],  [place] [address] [place], [place] [address]  Comprehensive metabolic panel     Status: Abnormal   Collection Time: 01/05/22  5:17 AM  Result Value Ref Range   Sodium 138 135 - 145 mmol/L   Potassium 4.0 3.5 - 5.1 mmol/L   Chloride 106 98 - 111 mmol/L   CO2 24 22 - 32 mmol/L   Glucose, Bld 96 70 - 99 mg/dL    Comment: Glucose reference range applies only to samples taken after fasting for at least 8 hours.   BUN 13 8 - 23 mg/dL   Creatinine, Ser 1.26 (H) 0.44 - 1.00 mg/dL   Calcium 8.2 (L) 8.9 - 10.3 mg/dL   Total Protein 6.1 (L) 6.5 - 8.1 g/dL   Albumin 2.6 (L) 3.5 - 5.0 g/dL   AST 83 (H) 15 - 41 U/L   ALT 40 0 - 44 U/L   Alkaline Phosphatase 63 38 - 126 U/L   Total Bilirubin 0.4 0.3 - 1.2 mg/dL   GFR, Estimated 46 (L) >60 mL/min    Comment: (NOTE) Calculated using the CKD-EPI Creatinine Equation (2021)    Anion gap 8 5 - 15    Comment: Performed at [facility], [place] [address] [place], [place] [address]  Magnesium     Status: None   Collection Time: 01/05/22  5:17 AM  Result Value Ref Range   Magnesium 2.1 1.7 - 2.4 mg/dL    Comment: Performed at [facility], [place] [address], [place], [place] [address]  Glucose, capillary     Status: Abnormal   Collection Time: 01/05/22  5:28 PM  Result Value Ref Range   Glucose-Capillary 115 (H) 70 - 99 mg/dL    Comment: Glucose reference range applies only to samples taken after fasting for at least 8 hours.  Glucose, capillary     Status: None   Collection Time: 01/05/22  9:29 PM  Result Value Ref Range   Glucose-Capillary 96 70 - 99 mg/dL    Comment: Glucose reference range applies only to samples taken after fasting for at least 8 hours.   Comment 1 Notify RN    Comment 2 Document in Chart   CBC with Differential/Platelet     Status: Abnormal   Collection Time: 01/06/22  7:04 AM  Result Value Ref Range   WBC 5.2 4.0 - 10.5 K/uL   RBC 3.08 (L) 3.87 - 5.11 MIL/uL   Hemoglobin 8.0 (L) 12.0 -  15.0 g/dL   HCT 26.9 (L) 36.0 - 46.0 %   MCV 87.3 80.0 - 100.0 fL   MCH 26.0 26.0 - 34.0  pg   MCHC 29.7 (L) 30.0 - 36.0 g/dL   RDW 17.2 (H) 11.5 - 15.5 %   Platelets 137 (L) 150 - 400 K/uL   nRBC 0.0 0.0 - 0.2 %   Neutrophils Relative % 65 %   Neutro Abs 3.4 1.7 - 7.7 K/uL   Lymphocytes Relative 21 %   Lymphs Abs 1.1 0.7 - 4.0 K/uL   Monocytes Relative 9 %   Monocytes Absolute 0.5 0.1 - 1.0 K/uL   Eosinophils Relative 4 %   Eosinophils Absolute 0.2 0.0 - 0.5 K/uL   Basophils Relative 1 %   Basophils Absolute 0.0 0.0 - 0.1 K/uL   Immature Granulocytes 0 %   Abs Immature Granulocytes 0.01 0.00 - 0.07 K/uL    Comment: Performed at [facility], [place] [address], [place], [place] [address]  Comprehensive metabolic panel     Status: Abnormal   Collection Time: 01/06/22  7:04 AM  Result Value Ref Range   Sodium 139 135 - 145 mmol/L   Potassium 3.2 (L) 3.5 - 5.1 mmol/L    Comment: DELTA CHECK NOTED   Chloride 105 98 - 111 mmol/L   CO2 24 22 - 32 mmol/L   Glucose, Bld 89 70 - 99 mg/dL    Comment: Glucose reference range applies only to samples taken after fasting for at least 8 hours.   BUN 12 8 - 23 mg/dL   Creatinine, Ser 1.30 (H) 0.44 - 1.00 mg/dL   Calcium 7.8 (L) 8.9 - 10.3 mg/dL   Total Protein 6.3 (L) 6.5 - 8.1 g/dL   Albumin 3.1 (L) 3.5 - 5.0 g/dL   AST 100 (H) 15 - 41 U/L   ALT 43 0 - 44 U/L   Alkaline Phosphatase 58 38 - 126 U/L   Total Bilirubin 0.3 0.3 - 1.2 mg/dL   GFR, Estimated 44 (L) >60 mL/min    Comment: (NOTE) Calculated using the CKD-EPI Creatinine Equation (2021)    Anion gap 10 5 - 15    Comment: Performed at [facility], [place] [address], [place], [place] [address]  Magnesium     Status: None   Collection Time: 01/06/22  7:04 AM  Result Value Ref Range   Magnesium 1.9 1.7 - 2.4 mg/dL    Comment: Performed at [facility], [place] [address], [place], [place] [address]  Glucose, capillary     Status:  None   Collection Time: 01/06/22  7:34 AM  Result Value Ref Range   Glucose-Capillary 85 70 - 99 mg/dL    Comment: Glucose reference range applies only to samples taken after fasting for at least 8 hours.  Glucose, capillary     Status: None   Collection Time: 01/06/22 11:45 AM  Result Value Ref Range   Glucose-Capillary 97 70 - 99 mg/dL    Comment: Glucose reference range applies only to samples taken after fasting for at least 8 hours.    Studies/Results: No results found.  Medications: I have reviewed the patient's current medications.  Assessment: Decompensated cirrhosis, history of significant alcohol use No evidence of hemochromatosis, in fact patient has iron deficiency Very low ceruloplasmin of 8(normal above 19)?  Error, will recheck with labs in 4 weeks Elevated alpha 1 antitrypsin?  Reactive Viral serology negative for chronic hepatitis B or hepatitis C Labs pending for ASMA, AMA  Ascites, no evidence of SBP, cytology pending Started on diuretics, creatinine 1.3, GFR 44, potassium 3.2  Iron deficiency anemia-no  evidence of esophageal or gastric varices, portal hypertensive gastropathy noted Biopsies pending from antrum and duodenal polyps  Plan: Okay to DC home on low-sodium diet Please send home on furosemide 20 mg daily and spironolactone 50 mg daily Okay to resume Eliquis in a.m. Continue pantoprazole 40 mg p.o. daily, likely indefinitely Patient will need BMP, CBC, ceruloplasmin and INR to be drawn in 4 weeks as an outpatient. 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 Discussed about complete abstinence from alcohol use. Discussed the same with the patient at bedside, her daughter [name] over the phone and patient's hospitalist Dr. Starla Link.  Ronnette Juniper, MD 01/06/2022, 11:55 AM

## 2022-01-07 LAB — MITOCHONDRIAL ANTIBODIES: Mitochondrial M2 Ab, IgG: <20 Units (ref 0.0–20.0)

## 2022-01-07 LAB — ANTI-SMOOTH MUSCLE ANTIBODY, IGG: F-Actin IgG: 8 Units (ref 0–19)

## 2022-01-08 ENCOUNTER — Encounter (HOSPITAL_COMMUNITY): Payer: Self-pay | Admitting: Gastroenterology

## 2022-01-08 LAB — GLUCOSE, CAPILLARY: Glucose-Capillary: 106 mg/dL — ABNORMAL HIGH (ref 70–99)

## 2022-01-09 LAB — CULTURE, BODY FLUID W GRAM STAIN -BOTTLE: Culture: NO GROWTH

## 2022-01-09 LAB — SURGICAL PATHOLOGY

## 2022-01-21 ENCOUNTER — Other Ambulatory Visit: Payer: Self-pay

## 2022-01-21 ENCOUNTER — Encounter (HOSPITAL_BASED_OUTPATIENT_CLINIC_OR_DEPARTMENT_OTHER): Payer: Self-pay

## 2022-01-21 ENCOUNTER — Inpatient Hospital Stay (HOSPITAL_BASED_OUTPATIENT_CLINIC_OR_DEPARTMENT_OTHER)
Admission: EM | Admit: 2022-01-21 | Discharge: 2022-01-26 | DRG: 683 | Disposition: A | Discharge status: Home Health | Payer: Medicare HMO | Attending: Internal Medicine | Admitting: Internal Medicine

## 2022-01-21 DIAGNOSIS — D649 Anemia, unspecified: Secondary | ICD-10-CM | POA: Diagnosis present

## 2022-01-21 DIAGNOSIS — Z9851 Tubal ligation status: Secondary | ICD-10-CM

## 2022-01-21 DIAGNOSIS — E1122 Type 2 diabetes mellitus with diabetic chronic kidney disease: Secondary | ICD-10-CM | POA: Diagnosis present

## 2022-01-21 DIAGNOSIS — K2270 Barrett's esophagus without dysplasia: Secondary | ICD-10-CM | POA: Diagnosis present

## 2022-01-21 DIAGNOSIS — Z79890 Hormone replacement therapy (postmenopausal): Secondary | ICD-10-CM

## 2022-01-21 DIAGNOSIS — I480 Paroxysmal atrial fibrillation: Secondary | ICD-10-CM | POA: Diagnosis not present

## 2022-01-21 DIAGNOSIS — N1831 Chronic kidney disease, stage 3a: Secondary | ICD-10-CM | POA: Diagnosis not present

## 2022-01-21 DIAGNOSIS — E039 Hypothyroidism, unspecified: Secondary | ICD-10-CM | POA: Diagnosis present

## 2022-01-21 DIAGNOSIS — E782 Mixed hyperlipidemia: Secondary | ICD-10-CM | POA: Diagnosis present

## 2022-01-21 DIAGNOSIS — I129 Hypertensive chronic kidney disease with stage 1 through stage 4 chronic kidney disease, or unspecified chronic kidney disease: Secondary | ICD-10-CM | POA: Diagnosis not present

## 2022-01-21 DIAGNOSIS — K3189 Other diseases of stomach and duodenum: Secondary | ICD-10-CM | POA: Diagnosis present

## 2022-01-21 DIAGNOSIS — I959 Hypotension, unspecified: Secondary | ICD-10-CM | POA: Diagnosis not present

## 2022-01-21 DIAGNOSIS — Z7901 Long term (current) use of anticoagulants: Secondary | ICD-10-CM | POA: Diagnosis not present

## 2022-01-21 DIAGNOSIS — N179 Acute kidney failure, unspecified: Secondary | ICD-10-CM | POA: Diagnosis not present

## 2022-01-21 DIAGNOSIS — K7030 Alcoholic cirrhosis of liver without ascites: Secondary | ICD-10-CM | POA: Diagnosis present

## 2022-01-21 DIAGNOSIS — H6121 Impacted cerumen, right ear: Secondary | ICD-10-CM | POA: Diagnosis not present

## 2022-01-21 DIAGNOSIS — E785 Hyperlipidemia, unspecified: Secondary | ICD-10-CM | POA: Diagnosis present

## 2022-01-21 DIAGNOSIS — Z7983 Long term (current) use of bisphosphonates: Secondary | ICD-10-CM | POA: Diagnosis not present

## 2022-01-21 DIAGNOSIS — R531 Weakness: Secondary | ICD-10-CM

## 2022-01-21 DIAGNOSIS — E876 Hypokalemia: Secondary | ICD-10-CM | POA: Diagnosis present

## 2022-01-21 DIAGNOSIS — Z888 Allergy status to other drugs, medicaments and biological substances status: Secondary | ICD-10-CM

## 2022-01-21 DIAGNOSIS — M109 Gout, unspecified: Secondary | ICD-10-CM | POA: Diagnosis not present

## 2022-01-21 DIAGNOSIS — E869 Volume depletion, unspecified: Secondary | ICD-10-CM | POA: Diagnosis not present

## 2022-01-21 DIAGNOSIS — R42 Dizziness and giddiness: Secondary | ICD-10-CM | POA: Diagnosis not present

## 2022-01-21 DIAGNOSIS — E1169 Type 2 diabetes mellitus with other specified complication: Secondary | ICD-10-CM | POA: Diagnosis not present

## 2022-01-21 DIAGNOSIS — I10 Essential (primary) hypertension: Secondary | ICD-10-CM | POA: Diagnosis not present

## 2022-01-21 DIAGNOSIS — Z79899 Other long term (current) drug therapy: Secondary | ICD-10-CM

## 2022-01-21 DIAGNOSIS — R188 Other ascites: Secondary | ICD-10-CM

## 2022-01-21 DIAGNOSIS — K766 Portal hypertension: Secondary | ICD-10-CM | POA: Diagnosis not present

## 2022-01-21 DIAGNOSIS — Z7984 Long term (current) use of oral hypoglycemic drugs: Secondary | ICD-10-CM

## 2022-01-21 DIAGNOSIS — D500 Iron deficiency anemia secondary to blood loss (chronic): Secondary | ICD-10-CM | POA: Diagnosis not present

## 2022-01-21 DIAGNOSIS — K7460 Unspecified cirrhosis of liver: Secondary | ICD-10-CM | POA: Diagnosis not present

## 2022-01-21 DIAGNOSIS — Z8673 Personal history of transient ischemic attack (TIA), and cerebral infarction without residual deficits: Secondary | ICD-10-CM

## 2022-01-21 DIAGNOSIS — Z8249 Family history of ischemic heart disease and other diseases of the circulatory system: Secondary | ICD-10-CM

## 2022-01-21 HISTORY — DX: Chronic kidney disease, stage 3 unspecified: N18.30

## 2022-01-21 HISTORY — DX: Barrett's esophagus without dysplasia: K22.70

## 2022-01-21 HISTORY — DX: Unspecified cirrhosis of liver: K74.60

## 2022-01-21 HISTORY — DX: Type 2 diabetes mellitus without complications: E11.9

## 2022-01-21 LAB — CBC WITH DIFFERENTIAL/PLATELET
Abs Immature Granulocytes: 0.05 10*3/uL (ref 0.00–0.07)
Basophils Absolute: 0.1 10*3/uL (ref 0.0–0.1)
Basophils Relative: 1 %
Eosinophils Absolute: 0.1 10*3/uL (ref 0.0–0.5)
Eosinophils Relative: 1 %
HCT: 26.1 % — ABNORMAL LOW (ref 36.0–46.0)
Hemoglobin: 7.7 g/dL — ABNORMAL LOW (ref 12.0–15.0)
Immature Granulocytes: 1 %
Lymphocytes Relative: 14 %
Lymphs Abs: 1.1 10*3/uL (ref 0.7–4.0)
MCH: 25.3 pg — ABNORMAL LOW (ref 26.0–34.0)
MCHC: 29.5 g/dL — ABNORMAL LOW (ref 30.0–36.0)
MCV: 85.9 fL (ref 80.0–100.0)
Monocytes Absolute: 0.6 10*3/uL (ref 0.1–1.0)
Monocytes Relative: 8 %
Neutro Abs: 5.7 10*3/uL (ref 1.7–7.7)
Neutrophils Relative %: 75 %
Platelets: 168 10*3/uL (ref 150–400)
RBC: 3.04 MIL/uL — ABNORMAL LOW (ref 3.87–5.11)
RDW: 17.2 % — ABNORMAL HIGH (ref 11.5–15.5)
WBC: 7.5 10*3/uL (ref 4.0–10.5)
nRBC: 0 % (ref 0.0–0.2)

## 2022-01-21 LAB — COMPREHENSIVE METABOLIC PANEL
ALT: 43 U/L (ref 0–44)
AST: 94 U/L — ABNORMAL HIGH (ref 15–41)
Albumin: 3.5 g/dL (ref 3.5–5.0)
Alkaline Phosphatase: 54 U/L (ref 38–126)
Anion gap: 10 (ref 5–15)
BUN: 25 mg/dL — ABNORMAL HIGH (ref 8–23)
CO2: 25 mmol/L (ref 22–32)
Calcium: 8.7 mg/dL — ABNORMAL LOW (ref 8.9–10.3)
Chloride: 100 mmol/L (ref 98–111)
Creatinine, Ser: 1.68 mg/dL — ABNORMAL HIGH (ref 0.44–1.00)
GFR, Estimated: 33 mL/min — ABNORMAL LOW (ref >60)
Glucose, Bld: 95 mg/dL (ref 70–99)
Potassium: 3.3 mmol/L — ABNORMAL LOW (ref 3.5–5.1)
Sodium: 135 mmol/L (ref 135–145)
Total Bilirubin: 0.8 mg/dL (ref 0.3–1.2)
Total Protein: 6.7 g/dL (ref 6.5–8.1)

## 2022-01-21 MED ORDER — EZETIMIBE 10 MG PO TABS
10.0000 mg | ORAL_TABLET | Freq: Every day | ORAL | Status: DC
  Administered 2022-01-21 – 2022-01-25 (×5): 10 mg via ORAL
  Filled 2022-01-21 (×5): qty 1

## 2022-01-21 MED ORDER — LACTATED RINGERS IV SOLN: INTRAVENOUS | Status: AC

## 2022-01-21 MED ORDER — APIXABAN 5 MG PO TABS: 5.0000 mg | ORAL_TABLET | Freq: Two times a day (BID) | ORAL | Status: DC

## 2022-01-21 MED ORDER — DILTIAZEM HCL ER COATED BEADS 120 MG PO CP24: 120.0000 mg | ORAL_CAPSULE | Freq: Every day | ORAL | Status: DC

## 2022-01-21 MED ORDER — SODIUM CHLORIDE 0.9 % IV BOLUS
500.0000 mL | Freq: Once | INTRAVENOUS | Status: AC
  Administered 2022-01-21: 500 mL via INTRAVENOUS

## 2022-01-21 MED ORDER — ACETAMINOPHEN 650 MG RE SUPP: 650.0000 mg | Freq: Four times a day (QID) | RECTAL | Status: DC | PRN

## 2022-01-21 MED ORDER — SERTRALINE HCL 50 MG PO TABS
50.0000 mg | ORAL_TABLET | Freq: Every day | ORAL | Status: DC
  Administered 2022-01-22 – 2022-01-26 (×5): 50 mg via ORAL
  Filled 2022-01-21 (×5): qty 1

## 2022-01-21 MED ORDER — AMIODARONE HCL 200 MG PO TABS
200.0000 mg | ORAL_TABLET | Freq: Two times a day (BID) | ORAL | Status: DC
  Administered 2022-01-21 – 2022-01-26 (×10): 200 mg via ORAL
  Filled 2022-01-21 (×10): qty 1

## 2022-01-21 MED ORDER — ONDANSETRON HCL 4 MG PO TABS: 4.0000 mg | ORAL_TABLET | Freq: Four times a day (QID) | ORAL | Status: DC | PRN

## 2022-01-21 MED ORDER — ROSUVASTATIN CALCIUM 20 MG PO TABS
40.0000 mg | ORAL_TABLET | Freq: Every day | ORAL | Status: DC
  Administered 2022-01-22 – 2022-01-26 (×5): 40 mg via ORAL
  Filled 2022-01-21 (×5): qty 2

## 2022-01-21 MED ORDER — PANTOPRAZOLE SODIUM 40 MG IV SOLR
40.0000 mg | Freq: Two times a day (BID) | INTRAVENOUS | Status: DC
Start: 2022-01-22 — End: 2022-01-22
  Administered 2022-01-21 – 2022-01-22 (×2): 40 mg via INTRAVENOUS
  Filled 2022-01-21 (×2): qty 10

## 2022-01-21 MED ORDER — HYDRALAZINE HCL 20 MG/ML IJ SOLN
10.0000 mg | Freq: Four times a day (QID) | INTRAMUSCULAR | Status: DC | PRN
Start: 2022-01-21 — End: 2022-01-26

## 2022-01-21 MED ORDER — ONDANSETRON HCL 4 MG/2ML IJ SOLN: 4.0000 mg | Freq: Four times a day (QID) | INTRAMUSCULAR | Status: DC | PRN

## 2022-01-21 MED ORDER — LEVOTHYROXINE SODIUM 100 MCG PO TABS
200.0000 ug | ORAL_TABLET | Freq: Every day | ORAL | Status: DC
  Administered 2022-01-22 – 2022-01-26 (×5): 200 ug via ORAL
  Filled 2022-01-21 (×6): qty 2

## 2022-01-21 MED ORDER — POTASSIUM CHLORIDE CRYS ER 20 MEQ PO TBCR
40.0000 meq | EXTENDED_RELEASE_TABLET | Freq: Once | ORAL | Status: AC
  Administered 2022-01-21: 40 meq via ORAL
  Filled 2022-01-21: qty 2

## 2022-01-21 MED ORDER — ACETAMINOPHEN 325 MG PO TABS
650.0000 mg | ORAL_TABLET | Freq: Four times a day (QID) | ORAL | Status: DC | PRN
  Administered 2022-01-22 (×2): 650 mg via ORAL
  Filled 2022-01-21 (×2): qty 2

## 2022-01-21 MED ORDER — POLYETHYLENE GLYCOL 3350 17 G PO PACK: 17.0000 g | PACK | Freq: Every day | ORAL | Status: DC | PRN

## 2022-01-21 MED ORDER — ALLOPURINOL 100 MG PO TABS
50.0000 mg | ORAL_TABLET | Freq: Every day | ORAL | Status: DC
  Administered 2022-01-22 – 2022-01-26 (×5): 50 mg via ORAL
  Filled 2022-01-21 (×5): qty 1

## 2022-01-21 MED ORDER — INSULIN ASPART 100 UNIT/ML IJ SOLN
0.0000 [IU] | Freq: Three times a day (TID) | INTRAMUSCULAR | Status: DC
  Administered 2022-01-22 (×2): 2 [IU] via SUBCUTANEOUS

## 2022-01-21 NOTE — Assessment & Plan Note (Signed)
.   Resume home regimen of Synthroid 

## 2022-01-21 NOTE — ED Notes (Signed)
[facility] here to transport pt to [facility] ?

## 2022-01-21 NOTE — Assessment & Plan Note (Addendum)
?   Recent diagnosis during previous hospitalization in early March ?? According to review of notes, cirrhosis is felt to presumably be secondary to alcohol use ?? Patient has been recently discharged on low-dose Lasix and Aldactone, but unfortunately became dehydrated ?? We will hold off on resuming daily diuretics for now.  I have advised her to restart diuretics if she starts noticing any developing edema ?? Of note, cirrhosis is complicated by presence of portal hypertensive gastropathy as noted the EGD earlier in the month by Dr. Therisa Doyne. ?

## 2022-01-21 NOTE — ED Notes (Addendum)
Report given to [facility] RN @ [facility] ?

## 2022-01-21 NOTE — ED Provider Notes (Signed)
?[facility] EMERGENCY DEPT ?Provider Note ? ? ?CSN: [id number] ?Arrival date & time: 01/21/22  1314 ? ?  ? ?History ? ?Chief Complaint  ?Patient presents with  ? Otalgia  ? Dizziness  ? ? ?Diane Pruitt is a 71 y.o. female. ? ?Pt complains of feeling weak and dizzy.  Pt was recently hospitalized for anemia and had a transfusion.  Pt reports she had a endoscopy and colonoscopy and was told no active bleeding but that she could be losing small amounts of blood.  Pt has a history of cirrhosis. Pt also reports foreign body sensation right ear   ? ?The history is provided by the patient. No language interpreter was used.  ?Otalgia ?Location:  Right ?Behind ear:  No abnormality ?Quality:  Aching ?Severity:  Moderate ?Onset quality:  Gradual ?Duration:  1 day ?Timing:  Constant ?Progression:  Worsening ?Chronicity:  New ?Relieved by:  Nothing ?Worsened by:  Nothing ?Ineffective treatments:  None tried ?Associated symptoms: no abdominal pain and no vomiting   ?Risk factors: no recent travel   ?Dizziness ?Severity:  Moderate ?Onset quality:  Gradual ?Duration:  1 week ?Timing:  Constant ?Progression:  Worsening ?Chronicity:  New ?Relieved by:  Nothing ?Worsened by:  Nothing ?Ineffective treatments:  None tried ?Associated symptoms: no blood in stool, no nausea and no vomiting   ?Risk factors: anemia   ? ?  ? ?Home Medications ?Prior to Admission medications   ?Medication Sig Start Date End Date Taking? Authorizing Provider  ?alendronate (FOSAMAX) 70 MG tablet Take 70 mg by mouth once a week. ?Patient not taking: Reported on 01/04/2022    [provider]  ?allopurinol (ZYLOPRIM) 100 MG tablet Take 200 mg by mouth daily. 01/20/20   [provider]  ?amiodarone (PACERONE) 200 MG tablet Take 200 mg by mouth in the morning and at bedtime.  02/27/18   [provider]  ?Cholecalciferol (VITAMIN D) 50 MCG (2000 UT) tablet Take 2,000 Units by mouth daily. 05/03/21   [provider]   ?diltiazem (CARDIZEM CD) 120 MG 24 hr capsule Take 120 mg by mouth daily. 12/29/21   [provider]  ?ELIQUIS 5 MG TABS tablet Take 1 tablet (5 mg total) by mouth 2 (two) times daily. Resume from 01/07/2022 01/07/22   Aline August, MD  ?ezetimibe (ZETIA) 10 MG tablet Take 10 mg by mouth at bedtime. 01/25/20   [provider]  ?ferrous sulfate 325 (65 FE) MG tablet Take 325 mg by mouth daily. 05/03/21   [provider]  ?furosemide (LASIX) 20 MG tablet Take 1 tablet (20 mg total) by mouth daily. 01/07/22   Aline August, MD  ?levothyroxine (SYNTHROID) 200 MCG tablet Take 200 mcg by mouth every morning. 12/26/21   [provider]  ?metFORMIN (GLUCOPHAGE) 500 MG tablet Take 500 mg by mouth 2 (two) times daily. 01/21/20   [provider]  ?Multiple Vitamin (MULTIVITAMIN) tablet Take 1 tablet by mouth daily.    [provider]  ?Multiple Vitamins-Minerals (PRESERVISION AREDS 2 PO) Take 1 capsule by mouth daily.    [provider]  ?pantoprazole (PROTONIX) 40 MG tablet Take 1 tablet (40 mg total) by mouth daily. 01/06/22 02/05/22  Aline August, MD  ?rosuvastatin (CRESTOR) 40 MG tablet Take 40 mg by mouth daily. 11/01/21   [provider]  ?sertraline (ZOLOFT) 50 MG tablet Take 50 mg by mouth daily. 11/01/21   [provider]  ?spironolactone (ALDACTONE) 50 MG tablet Take 1 tablet (50 mg total) by mouth  daily. 01/07/22   Aline August, MD  ?   ? ?Allergies    ?Colchicine   ? ?Review of Systems   ?Review of Systems  ?HENT:  Positive for ear pain.   ?Gastrointestinal:  Negative for abdominal pain, blood in stool, nausea and vomiting.  ?Neurological:  Positive for dizziness.  ?All other systems reviewed and are negative. ? ?Physical Exam ?Updated Vital Signs ?BP (!) 95/44   Pulse 69   Temp 97.8 ?F (36.6 ?C)   Resp 12   SpO2 96%  ?Physical Exam ?Vitals and nursing note reviewed.  ?Constitutional:   ?   Appearance: She is well-developed.  ?HENT:  ?    Head: Normocephalic.  ?   Right Ear: There is impacted cerumen.  ?   Left Ear: Tympanic membrane normal.  ?   Nose: Nose normal.  ?Eyes:  ?   Pupils: Pupils are equal, round, and reactive to light.  ?Cardiovascular:  ?   Rate and Rhythm: Normal rate.  ?Pulmonary:  ?   Effort: Pulmonary effort is normal.  ?Abdominal:  ?   General: Abdomen is flat. There is no distension.  ?Musculoskeletal:     ?   General: Normal range of motion.  ?   Cervical back: Normal range of motion.  ?Skin: ?   General: Skin is warm.  ?Neurological:  ?   General: No focal deficit present.  ?   Mental Status: She is alert and oriented to person, place, and time.  ?Psychiatric:     ?   Mood and Affect: Mood normal.  ? ? ?ED Results / Procedures / Treatments   ?Labs ?(all labs ordered are listed, but only abnormal results are displayed) ?Labs Reviewed  ?CBC WITH DIFFERENTIAL/PLATELET - Abnormal; Notable for the following components:  ?    Result Value  ? RBC 3.04 (*)   ? Hemoglobin 7.7 (*)   ? HCT 26.1 (*)   ? MCH 25.3 (*)   ? MCHC 29.5 (*)   ? RDW 17.2 (*)   ? All other components within normal limits  ?COMPREHENSIVE METABOLIC PANEL - Abnormal; Notable for the following components:  ? Potassium 3.3 (*)   ? BUN 25 (*)   ? Creatinine, Ser 1.68 (*)   ? Calcium 8.7 (*)   ? AST 94 (*)   ? GFR, Estimated 33 (*)   ? All other components within normal limits  ? ? ?EKG ?EKG Interpretation ? ?Date/Time:  Sunday January 21 2022 13:27:35 EDT ?Ventricular Rate:  75 ?PR Interval:  222 ?QRS Duration: 112 ?QT Interval:  362 ?QTC Calculation: 404 ?R Axis:   39 ?Text Interpretation: Sinus rhythm with 1st degree A-V block Low voltage QRS Septal infarct , age undetermined Abnormal ECG When compared with ECG of 03-Jan-2022 12:39, Similar to prior Confirmed by Nanda Quinton [phone]) on 01/21/2022 1:30:34 PM ? ?Radiology ?No results found. ? ?Procedures ?Procedures  ? ? ?Medications Ordered in ED ?Medications  ?sodium chloride 0.9 % bolus 500 mL (has no administration in  time range)  ? ? ?ED Course/ Medical Decision Making/ A&P ?  ?                        ?Medical Decision Making ?Pt complains of weakness.  Pt recently hospitalized for anemia ? ?Amount and/or Complexity of Data Reviewed ?Independent Historian:  ?   Details: Pt's son here with her. ?External Data Reviewed: notes. ?   Details: Hospitalist and Gi notes  reviewed ?Labs: ordered. Decision-making details documented in ED Course. ?   Details: labs ordered reviewed and interpreter.  Pt's hemoglobin is 7.7 ?ECG/medicine tests: ordered and independent interpretation performed. Decision-making details documented in ED Course. ?   Details: no acute change ?Discussion of management or test interpretation with external provider(s): I discussed pt with Dr. Trilby Drummer  triad Hospitalist who will admit for symptomatic anemia  ? ?Risk ?Decision regarding hospitalization. ? ? ? ? ? ? ? ? ? ? ?Final Clinical Impression(s) / ED Diagnoses ?Final diagnoses:  ?Iron deficiency anemia due to chronic blood loss  ?Weakness  ?Impacted cerumen of right ear  ? ? ?Rx / DC Orders ?ED Discharge Orders   ? ? None  ? ?  ? ? ?  ?Fransico Meadow, [place] ?01/21/22 2240 ? ?  ?Lianne Cure, DO ?73/41/93 7902 ? ?

## 2022-01-21 NOTE — Assessment & Plan Note (Addendum)
?   Patient initially presented with hemoglobin of 7.7, down from 8.0 recently ?? While this is does not appear abnormal at first glance, patient is volume depleted with concerns that hemoglobin may actually be much lower than this once patient receives fluids ?? EGD performed in early March revealed portal hypertensive gastropathy without active bleeding ?? Patient is additionally on chronic Eliquis therapy due to known history of atrial fibrillation ?? Seen by GI with recommendations to hold anticoagulation for another 10 days ?? She was transfused a total of 2 units of PRBC during her hospital stay ?? Discharge hemoglobin stable at 8.7 ?? Continue PPI twice daily ?? Repeat hemoglobin check planned for 4/6 ? ?

## 2022-01-21 NOTE — ED Triage Notes (Signed)
She further tells me that her right ear "doesn't really hurt, it feels like it's full". ?

## 2022-01-21 NOTE — Assessment & Plan Note (Signed)
.   Patient been placed on Accu-Cheks before every meal and nightly with sliding scale insulin . Holding home regimen of hypoglycemics . Hemoglobin A1C ordered . Diabetic Diet  

## 2022-01-21 NOTE — ED Notes (Signed)
Irrigated right ear for moderate amount of wax removal.  Able to see through to ear drum but wax remains to walls of ear channel  ?

## 2022-01-21 NOTE — Assessment & Plan Note (Addendum)
Resolved

## 2022-01-21 NOTE — H&P (Signed)
?History and Physical  ? ? ?Patient: Diane Pruitt MRN: [id number] [place]: 01/21/2022 ? ?Date of Service: the patient was seen and examined on 01/22/2022 ? ?Patient coming from:  Home via [facility] [place] [facility] ? ?Chief Complaint:  ?Chief Complaint  ?Patient presents with  ? Otalgia  ? Dizziness  ? ? ?HPI:  ? 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 ? ?Of note, patient was recently hospitalized at [facility] long hospital from 3/8 until 3/11 after presenting with acute blood loss anemia and new diagnosis of hepatic cirrhosis.  Patient was transfused 1 unit of packed red blood cells.  EGD was performed on 3/10 by Dr. Therisa Doyne which showed portal hypertensive gastropathy without evidence of active bleeding.  Patient was initiated on Lasix and spironolactone at time of discharge.  Patient was resumed on her usual regimen of Eliquis at time of discharge. ? ?Patient explains that for over the past 1 week she has been experiencing progressively worsening generalized weakness and associated light headedness.    This was initially mild in intensity but progressively has become more and more severe.   Patient's light headedness is mostly associated with sitting up or rising from a seated position and attempting to stand.  In the days the followed the patient noted that her blood pressure was getting low so she stopped taking her home Diltiazem.  Patient also spoke to her outpatient provider who recommended she hold her Lasix and only take Spironolactone.   Patient denies  fevers, hematemesis, hematochezia, sick contacts, recent travel.   ? ?Patient symptoms continued to worsen until she eventually presented to [facility] department for evaluation. ? ?Upon evaluation in the emergency department patient was felt to be clinically volume depleted.  Creatinine was found to be 1.68, up from baseline of 1.1.  Hemoglobin was additionally found to be 7.7 with the ER provider concerns that this could be lower once patient is hydrated.  Due to concerns for acute kidney injury, volume depletion and possible worsening anemia the hospitalist group was called and patient was excepted for transfer to [facility] for continued medical care.  Home via [place] ? ? ? ? ?Review of Systems: Review of Systems  ?Constitutional:  Positive for malaise/fatigue.  ?Neurological:  Positive for dizziness and weakness.  ?All other systems reviewed and are negative. ? ? ?Past Medical History:  ?Diagnosis Date  ? Atrial fibrillation ([place])   ? Barrett esophagus   ? Chronic kidney disease (CKD), stage III (moderate) (HCC)   ? Cirrhosis ([place])   ? Diabetes mellitus without complication ([place])   ? Dysrhythmia   ? Atrial Fibrillation 2 years ago  ? Hypertension   ? Ischemic stroke ([place]) 01/25/2018  ? Stroke [facility])   ? ? ?Past Surgical History:  ?Procedure Laterality Date  ? CESAREAN SECTION    ? ESOPHAGOGASTRODUODENOSCOPY (EGD) WITH PROPOFOL N/A 01/05/2022  ? Procedure: ESOPHAGOGASTRODUODENOSCOPY (EGD) WITH PROPOFOL;  Surgeon: Ronnette Juniper, MD;  Location: [facility];  Service: Gastroenterology;  Laterality: N/A;  ? EYE SURGERY    ? INTRAOCULAR LENS INSERTION Bilateral About 3 years ago  ? KYPHOPLASTY N/A 02/18/2020  ? Procedure: KYPHOPLASTY L2;  Surgeon:  Melina Schools, MD;  Location: [facility];  Service: Orthopedics;  Laterality: N/A;  60 mins  ? TONSILLECTOMY    ? TUBAL LIGATION    ? ? ?Social History:  reports that she has never smoked. She has never used smokeless tobacco. She reports that she  does not drink alcohol and does not use drugs. ? ?Allergies  ?Allergen Reactions  ? Colchicine Other (See Comments)  ?  Stomach upset  ? ? ?Family History  ?Problem Relation Age of Onset  ? Hypertension Mother   ? Hyperlipidemia Mother   ? Atrial fibrillation Mother   ? Hyperlipidemia Father   ? Atrial fibrillation Father   ? ? ?Prior to Admission medications   ?Medication Sig Start Date End Date Taking? Authorizing Provider  ?alendronate (FOSAMAX) 70 MG tablet Take 70 mg by mouth once a week. ?Patient not taking: Reported on 01/04/2022    [provider]  ?allopurinol (ZYLOPRIM) 100 MG tablet Take 200 mg by mouth daily. 01/20/20   [provider]  ?amiodarone (PACERONE) 200 MG tablet Take 200 mg by mouth in the morning and at bedtime.  02/27/18   [provider]  ?Cholecalciferol (VITAMIN D) 50 MCG (2000 UT) tablet Take 2,000 Units by mouth daily. 05/03/21   [provider]  ?diltiazem (CARDIZEM CD) 120 MG 24 hr capsule Take 120 mg by mouth daily. 12/29/21   [provider]  ?ELIQUIS 5 MG TABS tablet Take 1 tablet (5 mg total) by mouth 2 (two) times daily. Resume from 01/07/2022 01/07/22   Aline August, MD  ?ezetimibe (ZETIA) 10 MG tablet Take 10 mg by mouth at bedtime. 01/25/20   [provider]  ?ferrous sulfate 325 (65 FE) MG tablet Take 325 mg by mouth daily. 05/03/21   [provider]  ?furosemide (LASIX) 20 MG tablet Take 1 tablet (20 mg total) by mouth daily. 01/07/22   Aline August, MD  ?levothyroxine (SYNTHROID) 200 MCG tablet Take 200 mcg by mouth every morning. 12/26/21   [provider]  ?metFORMIN (GLUCOPHAGE) 500 MG tablet Take 500 mg by mouth 2 (two) times daily. 01/21/20   [provider]  ?Multiple Vitamin (MULTIVITAMIN) tablet Take 1 tablet by mouth daily.    [provider]  ?Multiple Vitamins-Minerals (PRESERVISION AREDS 2 PO) Take 1 capsule by mouth daily.    [provider]  ?pantoprazole (PROTONIX) 40 MG  tablet Take 1 tablet (40 mg total) by mouth daily. 01/06/22 02/05/22  Aline August, MD  ?rosuvastatin (CRESTOR) 40 MG tablet Take 40 mg by mouth daily. 11/01/21   [provider]  ?sertraline (ZOLOFT) 50 MG tablet Take 50 mg by mouth daily. 11/01/21   [provider]  ?spironolactone (ALDACTONE) 50 MG tablet Take 1 tablet (50 mg total) by mouth daily. 01/07/22   Aline August, MD  ? ? ?Physical Exam: ? ?Vitals:  ? 01/21/22 2129 01/21/22 2133 01/21/22 2345 01/22/22 0120  ?BP:  (!) 114/54 (!) 88/45 (!) 89/43  ?Pulse:   68 71  ?Resp:  '15 18 17  '$ ?Temp:  99.2 ?F (37.3 ?C) 98.2 ?F (36.8 ?C)   ?TempSrc:  Oral Oral   ?SpO2:  98% 100% 97%  ?Weight: 75.3 kg     ?Height: '5\' 2"'$  (1.575 m)     ? ? ?Constitutional: Awake alert and oriented x3, no associated distress.   ?Skin: no rashes, no lesions, Increased skin pallor with poor skin turgor noted. ?Eyes: Pupils are equally reactive to light.  No evidence of scleral  icterus or conjunctival pallor.  ?ENMT: Dry mucous membranes noted.  Posterior pharynx clear of any exudate or lesions.   ?Neck: normal, supple, no masses, no thyromegaly.  No evidence of jugular venous distension.   ?Respiratory: clear to auscultation bilaterally, no wheezing, no crackles. Normal respiratory effort. No accessory muscle use.  ?Cardiovascular: Regular rate and rhythm, no murmurs / rubs / gallops. No extremity edema. 2+ pedal pulses. No carotid bruits.  ?Chest:   Nontender without crepitus or deformity.   ?Back:   Nontender without crepitus or deformity. ?Abdomen: Abdomen is soft and nontender.  No evidence of intra-abdominal masses.  Positive bowel sounds noted in all quadrants.   ?Musculoskeletal: No joint deformity upper and lower extremities. Good ROM, no contractures. Normal muscle tone.  ?Neurologic: CN 2-12 grossly intact. Sensation intact.  Patient moving all 4 extremities spontaneously.  Patient is following all commands.  Patient is responsive to verbal stimuli.   ?Psychiatric:  Patient exhibits normal mood with appropriate affect.  Patient seems to possess insight as to their current situation.   ? ?Data Reviewed: ? ?I have personally reviewed and interpreted labs, imaging. ? ?Sig

## 2022-01-21 NOTE — Progress Notes (Signed)
Pt received via [facility] from [facility] , VSS, no acute distress.  Pt denies pain or other complaints of  at present.  Son at bedside.  MD notified of patient arrival. ?

## 2022-01-21 NOTE — ED Notes (Signed)
Report given to [place] with [facility] ?

## 2022-01-21 NOTE — Assessment & Plan Note (Addendum)
?   Holding diuretics as noted above ?? Home dose of diltiazem was discontinued ?? Blood pressures were noted to be running in the 90s with patient being asymptomatic ?? Started on a course of midodrine ? ? ? ?

## 2022-01-21 NOTE — Assessment & Plan Note (Addendum)
?   Patient presenting with several days of progressively worsening generalized weakness and lightheadedness shortly after being initiated on Lasix and spironolactone ?? Clinically, patient appears volume depleted and is relatively hypotensive with notably elevated creatinine of 1.7, up from baseline of approximately 1.0 suggestive of acute kidney injury ?? Diuretics have been held ?? Gentle intravenous hydration with isotonic fluids ?? Strict input and output monitoring ?? Avoiding nephrotoxic agents if at all possible ?? Serial chemistries being performed to monitor renal function and electrolytes ?? Serum creatinine down to 1.45 ?? This can be further followed up as an outpatient ?

## 2022-01-21 NOTE — ED Triage Notes (Signed)
She c/o mild, chronic weakness and dizziness. She also c/o right earache for past few days with a slight increase in the dizziness. She denies fever, nor any other sign of current illness.  ?

## 2022-01-21 NOTE — Assessment & Plan Note (Signed)
.   Continuing home regimen of lipid lowering therapy.  

## 2022-01-21 NOTE — Assessment & Plan Note (Addendum)
?   Currently rate controlled   ?? Continuing home amiodarone.   ?? Temporarily holding home regimen of Eliquis due to concerns for bleeding.  Per GI, will plan on restarting anticoagulation in the next 10 days (to be resumed 4/8) ?

## 2022-01-22 DIAGNOSIS — E1122 Type 2 diabetes mellitus with diabetic chronic kidney disease: Secondary | ICD-10-CM | POA: Diagnosis not present

## 2022-01-22 DIAGNOSIS — N1831 Chronic kidney disease, stage 3a: Secondary | ICD-10-CM | POA: Diagnosis not present

## 2022-01-22 DIAGNOSIS — E039 Hypothyroidism, unspecified: Secondary | ICD-10-CM | POA: Diagnosis not present

## 2022-01-22 DIAGNOSIS — I480 Paroxysmal atrial fibrillation: Secondary | ICD-10-CM | POA: Diagnosis not present

## 2022-01-22 DIAGNOSIS — K7460 Unspecified cirrhosis of liver: Secondary | ICD-10-CM | POA: Diagnosis not present

## 2022-01-22 DIAGNOSIS — N179 Acute kidney failure, unspecified: Secondary | ICD-10-CM | POA: Diagnosis not present

## 2022-01-22 DIAGNOSIS — E1169 Type 2 diabetes mellitus with other specified complication: Secondary | ICD-10-CM | POA: Diagnosis not present

## 2022-01-22 DIAGNOSIS — E876 Hypokalemia: Secondary | ICD-10-CM | POA: Diagnosis not present

## 2022-01-22 DIAGNOSIS — K766 Portal hypertension: Secondary | ICD-10-CM | POA: Diagnosis not present

## 2022-01-22 DIAGNOSIS — D500 Iron deficiency anemia secondary to blood loss (chronic): Secondary | ICD-10-CM | POA: Diagnosis not present

## 2022-01-22 DIAGNOSIS — D509 Iron deficiency anemia, unspecified: Secondary | ICD-10-CM | POA: Diagnosis not present

## 2022-01-22 DIAGNOSIS — I10 Essential (primary) hypertension: Secondary | ICD-10-CM | POA: Diagnosis not present

## 2022-01-22 LAB — CBC WITH DIFFERENTIAL/PLATELET
Abs Immature Granulocytes: 0.04 10*3/uL (ref 0.00–0.07)
Basophils Absolute: 0.1 10*3/uL (ref 0.0–0.1)
Basophils Relative: 1 %
Eosinophils Absolute: 0.1 10*3/uL (ref 0.0–0.5)
Eosinophils Relative: 2 %
HCT: 22.2 % — ABNORMAL LOW (ref 36.0–46.0)
Hemoglobin: 6.9 g/dL — CL (ref 12.0–15.0)
Immature Granulocytes: 1 %
Lymphocytes Relative: 20 %
Lymphs Abs: 1.4 10*3/uL (ref 0.7–4.0)
MCH: 26.6 pg (ref 26.0–34.0)
MCHC: 31.1 g/dL (ref 30.0–36.0)
MCV: 85.7 fL (ref 80.0–100.0)
Monocytes Absolute: 0.5 10*3/uL (ref 0.1–1.0)
Monocytes Relative: 7 %
Neutro Abs: 4.9 10*3/uL (ref 1.7–7.7)
Neutrophils Relative %: 69 %
Platelets: 150 10*3/uL (ref 150–400)
RBC: 2.59 MIL/uL — ABNORMAL LOW (ref 3.87–5.11)
RDW: 17.2 % — ABNORMAL HIGH (ref 11.5–15.5)
WBC: 7.1 10*3/uL (ref 4.0–10.5)
nRBC: 0 % (ref 0.0–0.2)

## 2022-01-22 LAB — CBC
HCT: 27.1 % — ABNORMAL LOW (ref 36.0–46.0)
Hemoglobin: 8.3 g/dL — ABNORMAL LOW (ref 12.0–15.0)
MCH: 25.6 pg — ABNORMAL LOW (ref 26.0–34.0)
MCHC: 30.6 g/dL (ref 30.0–36.0)
MCV: 83.6 fL (ref 80.0–100.0)
Platelets: 183 10*3/uL (ref 150–400)
RBC: 3.24 MIL/uL — ABNORMAL LOW (ref 3.87–5.11)
RDW: 17.9 % — ABNORMAL HIGH (ref 11.5–15.5)
WBC: 8.1 10*3/uL (ref 4.0–10.5)
nRBC: 0 % (ref 0.0–0.2)

## 2022-01-22 LAB — HEMOGLOBIN AND HEMATOCRIT, BLOOD
HCT: 27.6 % — ABNORMAL LOW (ref 36.0–46.0)
Hemoglobin: 8.4 g/dL — ABNORMAL LOW (ref 12.0–15.0)

## 2022-01-22 LAB — COMPREHENSIVE METABOLIC PANEL
ALT: 46 U/L — ABNORMAL HIGH (ref 0–44)
AST: 107 U/L — ABNORMAL HIGH (ref 15–41)
Albumin: 2.3 g/dL — ABNORMAL LOW (ref 3.5–5.0)
Alkaline Phosphatase: 56 U/L (ref 38–126)
Anion gap: 9 (ref 5–15)
BUN: 22 mg/dL (ref 8–23)
CO2: 22 mmol/L (ref 22–32)
Calcium: 8.2 mg/dL — ABNORMAL LOW (ref 8.9–10.3)
Chloride: 105 mmol/L (ref 98–111)
Creatinine, Ser: 1.7 mg/dL — ABNORMAL HIGH (ref 0.44–1.00)
GFR, Estimated: 32 mL/min — ABNORMAL LOW (ref >60)
Glucose, Bld: 113 mg/dL — ABNORMAL HIGH (ref 70–99)
Potassium: 2.9 mmol/L — ABNORMAL LOW (ref 3.5–5.1)
Sodium: 136 mmol/L (ref 135–145)
Total Bilirubin: 0.7 mg/dL (ref 0.3–1.2)
Total Protein: 5.6 g/dL — ABNORMAL LOW (ref 6.5–8.1)

## 2022-01-22 LAB — URINALYSIS, COMPLETE (UACMP) WITH MICROSCOPIC
Bilirubin Urine: NEGATIVE
Glucose, UA: NEGATIVE mg/dL
Hgb urine dipstick: NEGATIVE
Ketones, ur: NEGATIVE mg/dL
Leukocytes,Ua: NEGATIVE
Nitrite: NEGATIVE
Protein, ur: 100 mg/dL — AB
Specific Gravity, Urine: 1.017 (ref 1.005–1.030)
pH: 6 (ref 5.0–8.0)

## 2022-01-22 LAB — GLUCOSE, CAPILLARY
Glucose-Capillary: 129 mg/dL — ABNORMAL HIGH (ref 70–99)
Glucose-Capillary: 93 mg/dL (ref 70–99)
Glucose-Capillary: 97 mg/dL (ref 70–99)
Glucose-Capillary: 137 mg/dL — ABNORMAL HIGH (ref 70–99)

## 2022-01-22 LAB — PREPARE RBC (CROSSMATCH)

## 2022-01-22 LAB — PROTIME-INR
INR: 1.7 — ABNORMAL HIGH (ref 0.8–1.2)
Prothrombin Time: 19.6 seconds — ABNORMAL HIGH (ref 11.4–15.2)

## 2022-01-22 LAB — MAGNESIUM: Magnesium: 1.9 mg/dL (ref 1.7–2.4)

## 2022-01-22 LAB — HEMOGLOBIN A1C
Hgb A1c MFr Bld: 4.5 % — ABNORMAL LOW (ref 4.8–5.6)
Mean Plasma Glucose: 82.45 mg/dL

## 2022-01-22 LAB — SODIUM, URINE, RANDOM: Sodium, Ur: 15 mmol/L

## 2022-01-22 LAB — APTT: aPTT: 42 seconds — ABNORMAL HIGH (ref 24–36)

## 2022-01-22 MED ORDER — POTASSIUM CHLORIDE CRYS ER 20 MEQ PO TBCR
40.0000 meq | EXTENDED_RELEASE_TABLET | ORAL | Status: AC
  Administered 2022-01-22 (×2): 40 meq via ORAL
  Filled 2022-01-22 (×2): qty 2

## 2022-01-22 MED ORDER — PANTOPRAZOLE SODIUM 40 MG PO TBEC
40.0000 mg | DELAYED_RELEASE_TABLET | Freq: Two times a day (BID) | ORAL | Status: DC
  Administered 2022-01-22 – 2022-01-26 (×8): 40 mg via ORAL
  Filled 2022-01-22 (×8): qty 1

## 2022-01-22 MED ORDER — SODIUM CHLORIDE 0.9% IV SOLUTION: Freq: Once | INTRAVENOUS | Status: AC

## 2022-01-22 MED ORDER — POLYSACCHARIDE IRON COMPLEX 150 MG PO CAPS
150.0000 mg | ORAL_CAPSULE | Freq: Every day | ORAL | Status: DC
Start: 2022-01-22 — End: 2022-01-26
  Administered 2022-01-22 – 2022-01-26 (×5): 150 mg via ORAL
  Filled 2022-01-22 (×5): qty 1

## 2022-01-22 NOTE — Progress Notes (Signed)
RE: Diane Pruitt. [place] ? ?Date of Birth:[birth year] ? ?Date: 01/22/2022 ? ?To Whom It May Concern: ? ?Please be advised that the above-named patient will require a short-term nursing home stay - anticipated 30 days or less for rehabilitation and strengthening. The plan is for return home. ?

## 2022-01-22 NOTE — Progress Notes (Signed)
?PROGRESS NOTE ? ? ? ?Diane Pruitt  UDJ:[id number] DOB: 1951/02/02 DOA: 01/21/2022 ?PCP: [name], MD  ? ? ?Brief Narrative:  ? ?Patient is a 71 years old female with history of diabetes, hypertension, anemia, previous stroke, CKD stage IIIa, paroxysmal atrial fibrillation on Eliquis, hypothyroidism, Barrett's esophagus, history of cirrhosis of liver likely secondary to alcohol with portal hypertensive gastropathy presented to hospital with weakness lightheadedness.  Patient has had EGD on 01/05/2022 and was recently admitted between 3 8 2-3 11 in the hospital for acute blood loss anemia and diagnosis of new liver cirrhosis.  Patient was initiated on Lasix and spironolactone at that time and was resumed on her home regimen of Eliquis.  At this time patient has been complaining of 1 week of progressive weakness lightheadedness.  Initially in the ED patient was clinically volume depleted.  Creatinine was elevated at 1.6 from baseline 1.1.  Hemoglobin was low at 7.7.  Patient was then admitted hospital for acute kidney injury and volume depletion and worsening anemia.   ?  ?Assessment and Plan: ? Acute renal failure superimposed on stage 3a chronic kidney disease ([place]) ?Likely secondary to volume depletion.  Improved volume status at this time.  Creatinine elevated at 1.7.  Continue intake and output charting.  Avoid nephrotoxic medications.  Received 1 unit of packed RBC and fluids.  We will continue to monitor.  Hold with spironolactone and Lasix for now. ? ?Iron deficiency anemia due to chronic blood loss ?Hemoglobin of 7.7 on presentation down from 8.0.  Recent EGD with portal hypertensive gastropathy.  On chronic Eliquis for atrial fibrillation.  We will continue to hold Eliquis for now.  Patient received 1 unit of packed RBC this morning.  GI on board.  Will follow recommendations.  Continue Protonix for now. ? ?Cirrhosis of liver ([place]) ?Recent diagnosis during previous hospitalization in early March,  presumably thought to be secondary to alcohol use.  Patient with hypertensive gastropathy from EGD. ? ?Type 2 diabetes mellitus with stage 3a chronic kidney disease, without long-term current use of insulin ([place]) ?Continue Accu-Cheks sliding scale insulin.  Globin A1c of 4.5 at this time ? ?Hypokalemia ?Significant hypokalemia.  Potassium of 2.9 this morning.  Will received p.o. potassium 40x2 today. ? ?Essential hypertension ?Blood pressure is low on presentation.  Continue to hold antihypertensives including diuretics and Cardizem for now patient states that her blood pressure is borderline low all the time ? ?Paroxysmal atrial fibrillation (HCC) ?Continue amiodarone.  Hold Cardizem due to low blood pressure.  Hold Eliquis for now. ? ?Acquired hypothyroidism ?Continue Synthroid  ? ? ?Mixed diabetic hyperlipidemia associated with type 2 diabetes mellitus ([place]) ?Continue Crestor ? ? ? DVT prophylaxis: SCDs Start: 01/21/22 2258 ? ? ?Code Status:   ?  Code Status: Full Code ? ?Disposition: Home ?Status is: Observation ?The patient will require care spanning > 2 midnights and should be moved to inpatient because: Significant anemia, renal failure, hypokalemia ? ? family Communication: Spoke with the patient's daughter at bedside ? ?Consultants:  ?GI ? ?Procedures:  ?Transfusion of 1 unit of packed RBC ? ?Antimicrobials:  ?None ? ?Anti-infectives (From admission, onward)  ? ? None  ? ?  ? ? ? ?Subjective: ?Today, patient was seen and examined at bedside.  Patient denies any dizziness lightheadedness shortness of breath or chest pain.  Did have a bowel movement but no gross blood.  Eating food at the time of my evaluation. ? ?Objective: ?Vitals:  ? 01/22/22 0315 01/22/22 0345 01/22/22 5885  01/22/22 0713  ?BP: (!) 90/48 (!) 99/55 (!) 96/50 (!) 92/51  ?Pulse: 67 71 65 64  ?Resp: '13 18 15 13  '$ ?Temp: 99 ?F (37.2 ?C) 98.6 ?F (37 ?C) 98.1 ?F (36.7 ?C) 98.5 ?F (36.9 ?C)  ?TempSrc: Oral Axillary Oral Oral  ?SpO2: 97% 98% 99% 98%   ?Weight:      ?Height:      ? ? ?Intake/Output Summary (Last 24 hours) at 01/22/2022 0943 ?Last data filed at 01/22/2022 [phone] ?Gross per 24 hour  ?Intake 977.14 ml  ?Output 100 ml  ?Net 877.14 ml  ? ?Filed Weights  ? 01/21/22 2129  ?Weight: 75.3 kg  ? ? ?Physical Examination: ? ?General: Obese built, not in obvious distress ?HENT:   Mild pallor noted oral mucosa is moist.  ?Chest:  Clear breath sounds.  Diminished breath sounds bilaterally. No crackles or wheezes.  ?CVS: S1 &S2 heard. No murmur.  Regular rate and rhythm. ?Abdomen: Soft, nontender, nondistended.  Bowel sounds are heard.   ?Extremities: No cyanosis, clubbing or edema.  Peripheral pulses are palpable. ?Psych: Alert, awake and oriented, normal mood ?CNS:  No cranial nerve deficits.  Power equal in all extremities.   ?Skin: Warm and dry.  No rashes noted. ? ?Data Reviewed:  ? ?CBC: ?Recent Labs  ?Lab 01/21/22 ?1525 01/22/22 ?0058 01/22/22 ?0831  ?WBC 7.5 7.1 8.1  ?NEUTROABS 5.7 4.9  --   ?HGB 7.7* 6.9* 8.3*  ?HCT 26.1* 22.2* 27.1*  ?MCV 85.9 85.7 83.6  ?PLT 168 150 183  ? ? ?Basic Metabolic Panel: ?Recent Labs  ?Lab 01/21/22 ?1525 01/22/22 ?0058  ?NA 135 136  ?K 3.3* 2.9*  ?CL 100 105  ?CO2 25 22  ?GLUCOSE 95 113*  ?BUN 25* 22  ?CREATININE 1.68* 1.70*  ?CALCIUM 8.7* 8.2*  ?MG  --  1.9  ? ? ?Liver Function Tests: ?Recent Labs  ?Lab 01/21/22 ?1525 01/22/22 ?0058  ?AST 94* 107*  ?ALT 43 46*  ?ALKPHOS 54 56  ?BILITOT 0.8 0.7  ?PROT 6.7 5.6*  ?ALBUMIN 3.5 2.3*  ? ? ? ?Radiology Studies: ?No results found. ? ? ? LOS: 0 days  ? ? ?Flora Lipps, MD ?[facility] ?01/22/2022, 9:43 AM  ? ? ?

## 2022-01-22 NOTE — Care Management Obs Status (Signed)
MEDICARE OBSERVATION STATUS NOTIFICATION ? ? ?Patient Details  ?Name: Diane Pruitt ?MRN: [id number] ?Date of Birth: Nov 27, 1950 ? ? ?Medicare Observation Status Notification Given:  Yes ? ? ? ?Bethena Roys, RN ?01/22/2022, 4:33 PM ?

## 2022-01-22 NOTE — Progress Notes (Signed)
Patient's SBP continues to run 88-90 with LR infusing at 125 ml/hr.  Pt states her bp was running like that at home PTA. Reports dizziness upon standing.  MD notified of pressure and MAP as well as Hgb that just resulted.  Awaiting further orders. ?

## 2022-01-22 NOTE — Evaluation (Signed)
Physical Therapy Evaluation ?Patient Details ?Name: Diane Pruitt ?MRN: [id number] ?DOB: 1951/03/10 ?Today's Date: 01/22/2022 ? ?History of Present Illness ? 71 yo female adm 3/26 with lightheadedness with AKI and anemia. PMhx: Afib, CVA with L sided weakness, anemia, gout, DM, CKD, dizziness, liver cirrhosis, MDD, HTN  ?Clinical Impression ? Pt reports continued struggle with mobility at home and that she has essentially not gotten OOB for the last month other than brief trips to the bathroom. Kids have been assisting with cooking and cleaning but live 15 min and 1.5 hrs away and cannot maintain assist long term. Pt also reports very limited effort for bathing and caring for herself. Pt states she gets dizzy, weak and is fearful of falling, along with not feeling well limiting her. Today pt only with activity tolerance to get to and from bathroom from bed with denial for OOB to chair due to fatigue. Pt and daughter state that ST-SNf would be helpful to return to prior independent function and potential for ALF long term. Pt with decreased balance, gait and activity tolerance who will benefit from acute therapy to maximize safety and function. If SNF not possible HHPT and aide recommended. ?  ?HR 72 ?BP 97/55 (68)  ? ?Recommendations for follow up therapy are one component of a multi-disciplinary discharge planning process, led by the attending physician.  Recommendations may be updated based on patient status, additional functional criteria and insurance authorization. ? ?Follow Up Recommendations Skilled nursing-short term rehab (<3 hours/day) ? ?  ?Assistance Recommended at Discharge Intermittent Supervision/Assistance  ?Patient can return home with the following ? A little help with walking and/or transfers;A little help with bathing/dressing/bathroom;Assistance with cooking/housework;Assist for transportation ? ?  ?Equipment Recommendations Rolling walker (2 wheels);BSC/3in1  ?Recommendations for Other  Services ?    ?  ?Functional Status Assessment Patient has had a recent decline in their functional status and demonstrates the ability to make significant improvements in function in a reasonable and predictable amount of time.  ? ?  ?Precautions / Restrictions Precautions ?Precautions: Fall  ? ?  ? ?Mobility ? Bed Mobility ?Overal bed mobility: Modified Independent ?  ?  ?  ?  ?  ?  ?General bed mobility comments: increased time ?  ? ?Transfers ?Overall transfer level: Needs assistance ?  ?Transfers: Sit to/from Stand ?Sit to Stand: Supervision ?  ?  ?  ?  ?  ?General transfer comment: supervision for safety with pt stating no lightheadedness with standing but immediately reaching for environmental support ?  ? ?Ambulation/Gait ?Ambulation/Gait assistance: Min guard ?Gait Distance (Feet): 10 Feet ?Assistive device: None (pt reaching out for counter and environmental support) ?Gait Pattern/deviations: Step-through pattern, Decreased stride length ?  ?Gait velocity interpretation: <1.8 ft/sec, indicate of risk for recurrent falls ?  ?General Gait Details: pt furniture walking from bed to bathroom and back and denied further gait or mobility due to fatigue ? ?Stairs ?  ?  ?  ?  ?  ? ?Wheelchair Mobility ?  ? ?Modified Rankin (Stroke Patients Only) ?  ? ?  ? ?Balance Overall balance assessment: Needs assistance ?  ?Sitting balance-Leahy Scale: Good ?Sitting balance - Comments: static sitting without UE support ?  ?Standing balance support: Single extremity supported, No upper extremity supported ?Standing balance-Leahy Scale: Fair ?Standing balance comment: pt able to stand statically briefly without UE support, environmental support for weight shifting ?  ?  ?  ?  ?  ?  ?  ?  ?  ?  ?  ?   ? ? ? ?  Pertinent Vitals/Pain Pain Assessment ?Pain Assessment: No/denies pain  ? ? ?Home Living Family/patient expects to be discharged to:: Private residence ?Living Arrangements: Alone ?Available Help at Discharge:  Family;Available PRN/intermittently ?Type of Home: House ?Home Access: Stairs to enter ?  ?Entrance Stairs-Number of Steps: 1 ?  ?Home Layout: One level ?Home Equipment: Kasandra Knudsen - single point ?   ?  ?Prior Function Prior Level of Function : Needs assist ?  ?  ?  ?  ?  ?  ?Mobility Comments: pt reports very limited mobility and barely getting OOB for the last month, furniture walking ?ADLs Comments: has performed very limited bathing, son has been assisting with meals and daughter assisting with cleaning for the last month. Until Mary Sella was independent ?  ? ? ?Hand Dominance  ?   ? ?  ?Extremity/Trunk Assessment  ? Upper Extremity Assessment ?Upper Extremity Assessment: Generalized weakness ?  ? ?Lower Extremity Assessment ?Lower Extremity Assessment: Generalized weakness ?  ? ?Cervical / Trunk Assessment ?Cervical / Trunk Assessment: Normal  ?Communication  ? Communication: No difficulties  ?Cognition Arousal/Alertness: Awake/alert ?Behavior During Therapy: Flat affect ?Overall Cognitive Status: Impaired/Different from baseline ?Area of Impairment: Safety/judgement ?  ?  ?  ?  ?  ?  ?  ?  ?  ?  ?  ?  ?Safety/Judgement: Decreased awareness of deficits ?  ?  ?  ?  ?  ? ?  ?General Comments   ? ?  ?Exercises    ? ?Assessment/Plan  ?  ?PT Assessment Patient needs continued PT services  ?PT Problem List Decreased strength;Decreased mobility;Decreased activity tolerance;Decreased balance;Decreased knowledge of use of DME;Decreased safety awareness ? ?   ?  ?PT Treatment Interventions DME instruction;Gait training;Therapeutic exercise;Balance training;Functional mobility training;Therapeutic activities;Patient/family education;Stair training   ? ?PT Goals (Current goals can be found in the Care Plan section)  ?Acute Rehab PT Goals ?Patient Stated Goal: be able to return to caring for myself and going out to dinner ?PT Goal Formulation: With patient/family ?Time For Goal Achievement: 02/05/22 ?Potential to Achieve Goals:  Good ? ?  ?Frequency Min 3X/week ?  ? ? ?Co-evaluation   ?  ?  ?  ?  ? ? ?  ?AM-PAC PT "6 Clicks" Mobility  ?Outcome Measure Help needed turning from your back to your side while in a flat bed without using bedrails?: A Little ?Help needed moving from lying on your back to sitting on the side of a flat bed without using bedrails?: A Little ?Help needed moving to and from a bed to a chair (including a wheelchair)?: A Little ?Help needed standing up from a chair using your arms (e.g., wheelchair or bedside chair)?: A Little ?Help needed to walk in hospital room?: A Little ?Help needed climbing 3-5 steps with a railing? : A Lot ?6 Click Score: 17 ? ?  ?End of Session   ?Activity Tolerance: Patient limited by fatigue ?Patient left: in bed;with call bell/phone within reach;with family/visitor present ?Nurse Communication: Mobility status ?PT Visit Diagnosis: Muscle weakness (generalized) (M62.81);Difficulty in walking, not elsewhere classified (R26.2);Other abnormalities of gait and mobility (R26.89) ?  ? ?Time: 1253-1316 ?PT Time Calculation (min) (ACUTE ONLY): 23 min ? ? ?Charges:   PT Evaluation ?$PT Eval Moderate Complexity: 1 Mod ?  ?  ?   ? ? ?Jennyfer Nickolson P, PT ?Acute Rehabilitation Services ?Pager: [phone] ?Office: [phone] ? ? ?Darian Cansler B Dhruv Christina ?01/22/2022, 1:47 PM ? ?

## 2022-01-22 NOTE — Care Management Obs Status (Deleted)
MEDICARE OBSERVATION STATUS NOTIFICATION ? ? ?Patient Details  ?Name: Diane Pruitt ?MRN: [id number] ?Date of Birth: 11-14-1950 ? ? ?Medicare Observation Status Notification Given:    ? ? ? ?Bethena Roys, RN ?01/22/2022, 4:32 PM ?

## 2022-01-22 NOTE — Plan of Care (Signed)
  Problem: Activity: Goal: Risk for activity intolerance will decrease Outcome: Progressing   Problem: Coping: Goal: Level of anxiety will decrease Outcome: Progressing   

## 2022-01-22 NOTE — Consult Note (Signed)
Referring Provider: Sherryll Burger. Cyd Silence, MD ?Primary Care Physician:  [name], MD ?Primary Gastroenterologist: [facility], Dr. Therisa Doyne ? ?Reason for Consultation:  Symptomatic anemia, cirrhosis ? ?HPI: Diane Pruitt is a 71 y.o. 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 GI consulted for symptomatic anemia with hemoglobin 6.9 on 3/27 (down from 8.0 on 3/11). ? ?Patient was recently hospitalized at [facility] from 3/8 until 3/11 after presenting with acute blood loss anemia and new diagnosis of hepatic cirrhosis.  Patient was transfused 1 unit of packed red blood cells.  EGD was performed on 3/10 by Dr. Therisa Doyne which showed portal hypertensive gastropathy without evidence of active bleeding.  Patient was started on Lasix and spironolactone at time of discharge.  Patient resumed Eliquis at time of discharge. ? ?Patient reports weakness and lightheadedness since hospital discharge, worsening in the last week.  Denies nausea, vomiting, fever, chills, abdominal pain, melena, hematochezia. Tolerating diet. Denies drinking alcohol since discharge, denies smoking, denies NSAIDs.  Denies history of similar episodes.  She reports syncopal episode last Wednesday, brief, denies injury. ? ?Patient's daughter in the room with her this morning.   ? ?EGD 01/05/22 with Dr. Therisa Doyne for IDA and new cirrhosis - Normal esophagus, portal hyptertensive gastropathy, 3 duodenal polyps resected. Recall 1 year to screen for esophageal varices. ? ?Colon/EGD 06/15/2021 - Barrett's mucosa and 2 cm hiatal hernia on EGD. 11 tubular adenomas and diverticulosis on colonoscopy. Repeat colon 3  years. ? ? ?Past Medical History:  ?Diagnosis Date  ? Atrial fibrillation ([place])   ? Barrett esophagus   ? Chronic kidney disease (CKD), stage III (moderate) (HCC)   ? Cirrhosis ([place])   ? Diabetes mellitus without complication ([place])   ? Dysrhythmia   ? Atrial Fibrillation 2 years ago  ? Hypertension   ? Ischemic stroke ([place]) 01/25/2018  ? Stroke [facility])   ? ? ?Past Surgical History:  ?Procedure Laterality Date  ? CESAREAN SECTION    ? ESOPHAGOGASTRODUODENOSCOPY (EGD) WITH PROPOFOL N/A 01/05/2022  ? Procedure: ESOPHAGOGASTRODUODENOSCOPY (EGD) WITH PROPOFOL;  Surgeon: Ronnette Juniper, MD;  Location: [facility];  Service: Gastroenterology;  Laterality: N/A;  ? EYE SURGERY    ? INTRAOCULAR LENS INSERTION Bilateral About 3 years ago  ? KYPHOPLASTY N/A 02/18/2020  ? Procedure: KYPHOPLASTY L2;  Surgeon: Melina Schools, MD;  Location: [place];  Service: Orthopedics;  Laterality: N/A;  60 mins  ? TONSILLECTOMY    ? TUBAL LIGATION    ? ? ?Prior to Admission medications   ?Medication Sig Start Date End Date Taking? Authorizing Provider  ?alendronate (FOSAMAX) 70 MG tablet Take 70 mg by mouth once a week. ?Patient not taking: Reported on 01/04/2022    [provider]  ?allopurinol (ZYLOPRIM) 100 MG tablet Take 200 mg by mouth daily. 01/20/20   [provider]  ?amiodarone (PACERONE) 200 MG tablet Take 200 mg by mouth in the morning and at bedtime.  02/27/18   [provider]  ?Cholecalciferol (VITAMIN D) 50 MCG (2000 UT) tablet Take 2,000 Units by mouth daily. 05/03/21   [provider]  ?diltiazem (CARDIZEM CD) 120 MG 24 hr capsule Take 120 mg by mouth daily. 12/29/21   [provider]  ?ELIQUIS 5 MG TABS tablet Take 1 tablet (  5 mg total) by mouth 2 (two) times daily. Resume from 01/07/2022 01/07/22   Aline August, MD  ?ezetimibe (ZETIA) 10 MG tablet Take 10 mg by mouth at bedtime. 01/25/20   [provider]  ?ferrous sulfate 325 (65 FE) MG tablet Take 325 mg by mouth daily. 05/03/21    [provider]  ?furosemide (LASIX) 20 MG tablet Take 1 tablet (20 mg total) by mouth daily. 01/07/22   Aline August, MD  ?levothyroxine (SYNTHROID) 200 MCG tablet Take 200 mcg by mouth every morning. 12/26/21   [provider]  ?metFORMIN (GLUCOPHAGE) 500 MG tablet Take 500 mg by mouth 2 (two) times daily. 01/21/20   [provider]  ?Multiple Vitamin (MULTIVITAMIN) tablet Take 1 tablet by mouth daily.    [provider]  ?Multiple Vitamins-Minerals (PRESERVISION AREDS 2 PO) Take 1 capsule by mouth daily.    [provider]  ?pantoprazole (PROTONIX) 40 MG tablet Take 1 tablet (40 mg total) by mouth daily. 01/06/22 02/05/22  Aline August, MD  ?rosuvastatin (CRESTOR) 40 MG tablet Take 40 mg by mouth daily. 11/01/21   [provider]  ?sertraline (ZOLOFT) 50 MG tablet Take 50 mg by mouth daily. 11/01/21   [provider]  ?spironolactone (ALDACTONE) 50 MG tablet Take 1 tablet (50 mg total) by mouth daily. 01/07/22   Aline August, MD  ? ? ?Scheduled Meds: ? allopurinol  50 mg Oral Daily  ? amiodarone  200 mg Oral BID  ? ezetimibe  10 mg Oral QHS  ? insulin aspart  0-15 Units Subcutaneous TID AC & HS  ? levothyroxine  200 mcg Oral Q0600  ? pantoprazole (PROTONIX) IV  40 mg Intravenous Q12H  ? potassium chloride  40 mEq Oral Q4H  ? rosuvastatin  40 mg Oral Daily  ? sertraline  50 mg Oral Daily  ? ?Continuous Infusions: ?PRN Meds:.acetaminophen **OR** acetaminophen, hydrALAZINE, ondansetron **OR** ondansetron (ZOFRAN) IV, polyethylene glycol ? ?Allergies as of 01/21/2022 - Review Complete 01/21/2022  ?Allergen Reaction Noted  ? Colchicine Other (See Comments) 07/17/2021  ? ? ?Family History  ?Problem Relation Age of Onset  ? Hypertension Mother   ? Hyperlipidemia Mother   ? Atrial fibrillation Mother   ? Hyperlipidemia Father   ? Atrial fibrillation Father   ? ? ?Social History  ? ?Socioeconomic History  ? Marital status: Divorced  ?  Spouse name: Not on file  ?  Number of children: Not on file  ? Years of education: Not on file  ? Highest education level: Not on file  ?Occupational History  ? Not on file  ?Tobacco Use  ? Smoking status: Never  ? Smokeless tobacco: Never  ?Vaping Use  ? Vaping Use: Never used  ?Substance and Sexual Activity  ? Alcohol use: Never  ? Drug use: Never  ? Sexual activity: Not on file  ?Other Topics Concern  ? Not on file  ?Social History Narrative  ? Not on file  ? ?Social Determinants of Health  ? ?Financial Resource Strain: Not on file  ?Food Insecurity: Not on file  ?Transportation Needs: Not on file  ?Physical Activity: Not on file  ?Stress: Not on file  ?Social Connections: Not on file  ?Intimate Partner Violence: Not on file  ? ? ?Review of Systems: Review of Systems  ?Constitutional:  Negative for chills and fever.  ?HENT:  Negative for sore throat.   ?Eyes:  Negative for pain and discharge.  ?Respiratory:  Negative for cough, hemoptysis and stridor.   ?  Cardiovascular:  Negative for chest pain and palpitations.  ?Gastrointestinal:  Negative for abdominal pain, blood in stool, constipation, diarrhea, heartburn, melena, nausea and vomiting.  ?Genitourinary:  Negative for dysuria and urgency.  ?Musculoskeletal:  Negative for myalgias and neck pain.  ?Neurological:  Positive for dizziness, loss of consciousness (Reports syncopal episode 3/22) and weakness. Negative for seizures and headaches.  ?Endo/Heme/Allergies:  Negative for environmental allergies and polydipsia.  ?Psychiatric/Behavioral:  Negative for memory loss. The patient does not have insomnia.    ? ?Physical Exam: Physical Exam ?Constitutional:   ?   General: She is not in acute distress. ?   Appearance: Normal appearance.  ?HENT:  ?   Head: Normocephalic and atraumatic.  ?   Right Ear: External ear normal.  ?   Left Ear: External ear normal.  ?   Nose: Nose normal.  ?   Mouth/Throat:  ?   Mouth: Mucous membranes are moist.  ?   Pharynx: Oropharynx is clear.  ?Eyes:  ?    Extraocular Movements: Extraocular movements intact.  ?   Comments: Conjunctival pallor  ?Cardiovascular:  ?   Rate and Rhythm: Normal rate and regular rhythm.  ?   Pulses: Normal pulses.  ?   Heart sounds: Normal heart sounds.

## 2022-01-22 NOTE — TOC Initial Note (Signed)
Transition of Care (TOC) - Initial/Assessment Note  ? ? ?Patient Details  ?Name: Diane Pruitt ?MRN: [id number] ?Date of Birth: [birth year] ? ?Transition of Care (TOC) CM/SW Contact:    ?Milas Gain, LCSWA ?Phone Number: ?01/22/2022, 4:54 PM ? ?Clinical Narrative:                 ? ? ?  ? CSW received consult for possible SNF placement at time of discharge. CSW spoke with patient and patient gave permission for CSW to speak with her daughter Diane Pruitt  at beside regarding PT recommendation of SNF placement at time of discharge. Patient reports she comes from home alone.Patient expressed understanding of PT recommendation and is agreeable to SNF placement at time of discharge. Patient gave CSW permission to fax out initial referral near the [place] area. CSW discussed insurance authorization process with patient. Patient reports she has received the COVID vaccines as well as 1 booster.  No further questions reported at this time. CSW to continue to follow and assist with discharge planning needs.  ? ? ?Patient Goals and CMS Choice ?  ?  ?  ? ?Expected Discharge Plan and Services ?  ?  ?  ?  ?  ?Expected Discharge Date: 01/25/22               ?  ?  ?  ?  ?  ?  ?  ?  ?  ?  ? ?Prior Living Arrangements/Services ?  ?  ?  ?       ?  ?  ?  ?  ? ?Activities of Daily Living ?Home Assistive Devices/Equipment: None ?ADL Screening (condition at time of admission) ?Patient's cognitive ability adequate to safely complete daily activities?: Yes ?Is the patient deaf or have difficulty hearing?: No ?Does the patient have difficulty seeing, even when wearing glasses/contacts?: No ?Does the patient have difficulty concentrating, remembering, or making decisions?: No ?Patient able to express need for assistance with ADLs?: Yes ?Does the patient have difficulty dressing or bathing?: No ?Independently performs ADLs?: Yes (appropriate for developmental age) ?Does the patient have difficulty walking or climbing stairs?: No ?Weakness of  Legs: Both ?Weakness of Arms/Hands: None ? ?Permission Sought/Granted ?  ?  ?   ?   ?   ?   ? ?Emotional Assessment ?  ?  ?  ?  ?  ?  ? ?Admission diagnosis:  Symptomatic anemia [D64.9] ?Patient Active Problem List  ? Diagnosis Date Noted  ? Mixed diabetic hyperlipidemia associated with type 2 diabetes mellitus ([place]) 01/21/2022  ? Hypokalemia 01/06/2022  ? Acute blood loss anemia 01/04/2022  ? Cirrhosis of liver ([place]) 01/04/2022  ? Acute renal failure superimposed on stage 3a chronic kidney disease ([place]) 01/04/2022  ? Elevated LFTs 01/04/2022  ? Gout 01/04/2022  ? Essential hypertension 01/04/2022  ? Depression 01/04/2022  ? GI bleed 01/04/2022  ? Iron deficiency anemia due to chronic blood loss 01/03/2022  ? Unspecified abnormal finding in specimens from other organs, systems and tissues 11/20/2021  ? Age-related osteoporosis without current pathological fracture 11/01/2021  ? Chronic gouty arthritis 11/01/2021  ? Excessive sleepiness 11/01/2021  ? History of embolic stroke 31/51/7616  ? History of tobacco use 11/01/2021  ? Hypercoagulable state ([place]) 11/01/2021  ? Iron deficiency anemia 11/01/2021  ? Obesity due to excess calories 11/01/2021  ? Personal history of colonic polyps 11/01/2021  ? Polyp of colon 11/01/2021  ? Severe major depression, single episode, without psychotic features ([place]) 11/01/2021  ? Acquired  hypothyroidism 02/12/2020  ? Type 2 diabetes mellitus with stage 3a chronic kidney disease, without long-term current use of insulin ([place]) 02/12/2020  ? Compression fracture of L2 ([place]) 02/01/2020  ? Low back pain 01/18/2020  ? Pain of right hip joint 01/18/2020  ? Paroxysmal atrial fibrillation ([place]) 01/25/2018  ? Cyst of finger 01/23/2018  ? ?PCP:  [name], MD ?Pharmacy:   ?[id number] [address]?[id number]?Phone: [phone]: [phone]? ? ? ? ?Social Determinants of Health (SDOH)  Interventions ?  ? ?Readmission Risk Interventions ?   ? View : No data to display.  ?  ?  ?  ? ? ? ?

## 2022-01-22 NOTE — [place] FL2 (Signed)
?[place] MEDICAID FL2 LEVEL OF CARE SCREENING TOOL  ?  ? ?IDENTIFICATION  ?Patient Name: ?Diane Pruitt Birthdate: 10-Aug-1951 Sex: female Admission Date (Current Location): ?01/21/2022  ?[place] and [place] Number: ? [place] ?  Facility and Address:  ?[facility] [place] [address], [place], [place] [address] ?     Provider Number: ?[id number]  ?Attending Physician Name and Address:  ?Flora Lipps, MD ? Relative Name and Phone Number:  ?Aaron Edelman [phone] ?   ?Current Level of Care: ?Hospital Recommended Level of Care: ?[place] Prior Approval Number: ?  ? ?Date Approved/Denied: ?  PASRR Number: ?PASRR under review ? ?Discharge Plan: ?SNF ?  ? ?Current Diagnoses: ?Patient Active Problem List  ? Diagnosis Date Noted  ? Mixed diabetic hyperlipidemia associated with type 2 diabetes mellitus ([place]) 01/21/2022  ? Hypokalemia 01/06/2022  ? Acute blood loss anemia 01/04/2022  ? Cirrhosis of liver ([place]) 01/04/2022  ? Acute renal failure superimposed on stage 3a chronic kidney disease ([place]) 01/04/2022  ? Elevated LFTs 01/04/2022  ? Gout 01/04/2022  ? Essential hypertension 01/04/2022  ? Depression 01/04/2022  ? GI bleed 01/04/2022  ? Iron deficiency anemia due to chronic blood loss 01/03/2022  ? Unspecified abnormal finding in specimens from other organs, systems and tissues 11/20/2021  ? Age-related osteoporosis without current pathological fracture 11/01/2021  ? Chronic gouty arthritis 11/01/2021  ? Excessive sleepiness 11/01/2021  ? History of embolic stroke 41/66/0630  ? History of tobacco use 11/01/2021  ? Hypercoagulable state ([place]) 11/01/2021  ? Iron deficiency anemia 11/01/2021  ? Obesity due to excess calories 11/01/2021  ? Personal history of colonic polyps 11/01/2021  ? Polyp of colon 11/01/2021  ? Severe major depression, single episode, without psychotic features ([place]) 11/01/2021  ? Acquired hypothyroidism 02/12/2020  ? Type 2 diabetes mellitus with stage 3a chronic  kidney disease, without long-term current use of insulin ([place]) 02/12/2020  ? Compression fracture of L2 ([place]) 02/01/2020  ? Low back pain 01/18/2020  ? Pain of right hip joint 01/18/2020  ? Paroxysmal atrial fibrillation ([place]) 01/25/2018  ? Cyst of finger 01/23/2018  ? ? ?Orientation RESPIRATION BLADDER Height & Weight   ?  ?Self, Time, Situation, Place (WDL) ? Normal Continent Weight: 166 lb (75.3 kg) ?Height:  '5\' 2"'$  (157.5 cm)  ?BEHAVIORAL SYMPTOMS/MOOD NEUROLOGICAL BOWEL NUTRITION STATUS  ?    Continent Diet (Please see discharge summary)  ?AMBULATORY STATUS COMMUNICATION OF NEEDS Skin   ?Limited Assist Verbally Other (Comment) (Pale,dry,Ecchymosis,arm,leg,bilateral,skin turgor,non-tenting) ?  ?  ?  ?    ?     ?     ? ? ?Personal Care Assistance Level of Assistance  ?Bathing, Feeding, Dressing Bathing Assistance: Limited assistance ?Feeding assistance: Independent ?Dressing Assistance: Limited assistance ?   ? ?Functional Limitations Info  ?Sight, Hearing, Speech Sight Info:  (WDL) ?Hearing Info: Adequate ?Speech Info: Adequate  ? ? ?SPECIAL CARE FACTORS FREQUENCY  ?PT (By licensed PT), OT (By licensed OT)   ?  ?PT Frequency: 5x min weekly ?OT Frequency: 5x min weekly ?  ?  ?  ?   ? ? ?Contractures Contractures Info: Not present  ? ? ?Additional Factors Info  ?Code Status, Allergies, Insulin Sliding Scale, Psychotropic Code Status Info: FULL ?Allergies Info: Colchicine ?Psychotropic Info: sertraline (ZOLOFT) tablet 50 mg daily ?Insulin Sliding Scale Info: insulin aspart (novoLOG) injection 0-15 Units 3 times daily before meals and bedtime ?  ?   ? ?Current Medications (01/22/2022):  This is the current hospital  active medication list ?Current Facility-Administered Medications  ?Medication Dose Route Frequency Provider Last Rate Last Admin  ? acetaminophen (TYLENOL) tablet 650 mg  650 mg Oral Q6H PRN Vernelle Emerald, MD   650 mg at 01/22/22 1149  ? Or  ? acetaminophen (TYLENOL) suppository 650 mg  650 mg Rectal  Q6H PRN Shalhoub, Sherryll Burger, MD      ? allopurinol (ZYLOPRIM) tablet 50 mg  50 mg Oral Daily Shalhoub, Sherryll Burger, MD   50 mg at 01/22/22 2025  ? amiodarone (PACERONE) tablet 200 mg  200 mg Oral BID Vernelle Emerald, MD   200 mg at 01/22/22 4270  ? ezetimibe (ZETIA) tablet 10 mg  10 mg Oral QHS Vernelle Emerald, MD   10 mg at 01/21/22 2347  ? hydrALAZINE (APRESOLINE) injection 10 mg  10 mg Intravenous Q6H PRN Shalhoub, Sherryll Burger, MD      ? insulin aspart (novoLOG) injection 0-15 Units  0-15 Units Subcutaneous TID AC & HS Shalhoub, Sherryll Burger, MD   2 Units at 01/22/22 [phone]  ? iron polysaccharides (NIFEREX) capsule 150 mg  150 mg Oral Daily Angelique Holm, PA-C   150 mg at 01/22/22 1622  ? levothyroxine (SYNTHROID) tablet 200 mcg  200 mcg Oral Q0600 Vernelle Emerald, MD   200 mcg at 01/22/22 6283  ? ondansetron (ZOFRAN) tablet 4 mg  4 mg Oral Q6H PRN Shalhoub, Sherryll Burger, MD      ? Or  ? ondansetron (ZOFRAN) injection 4 mg  4 mg Intravenous Q6H PRN Shalhoub, Sherryll Burger, MD      ? pantoprazole (PROTONIX) EC tablet 40 mg  40 mg Oral BID Arta Silence, MD      ? polyethylene glycol (MIRALAX / GLYCOLAX) packet 17 g  17 g Oral Daily PRN Shalhoub, Sherryll Burger, MD      ? rosuvastatin (CRESTOR) tablet 40 mg  40 mg Oral Daily Vernelle Emerald, MD   40 mg at 01/22/22 1517  ? sertraline (ZOLOFT) tablet 50 mg  50 mg Oral Daily Shalhoub, Sherryll Burger, MD   50 mg at 01/22/22 6160  ? ? ? ?Discharge Medications: ?Please see discharge summary for a list of discharge medications. ? ?Relevant Imaging Results: ? ?Relevant Lab Results: ? ? ?Additional Information ?[id number], Both Covid Vaccines and 1 Booster ? ?Milas Gain, LCSWA ? ? ? ? ?

## 2022-01-22 NOTE — Progress Notes (Signed)
Date and time results received: 01/22/22 0145 ?(use smartphrase ".now" to insert current time) ? ?Test: Hgb  ?Critical Value: 6.9 ? ?Name of Provider Notified: Dr. Cyd Silence ? ?Orders Received? Or Actions Taken?: Will provide orders for blood transfusion ?

## 2022-01-23 ENCOUNTER — Encounter (HOSPITAL_COMMUNITY): Payer: Self-pay | Admitting: Internal Medicine

## 2022-01-23 DIAGNOSIS — I480 Paroxysmal atrial fibrillation: Secondary | ICD-10-CM | POA: Diagnosis not present

## 2022-01-23 DIAGNOSIS — E1169 Type 2 diabetes mellitus with other specified complication: Secondary | ICD-10-CM | POA: Diagnosis not present

## 2022-01-23 DIAGNOSIS — E1122 Type 2 diabetes mellitus with diabetic chronic kidney disease: Secondary | ICD-10-CM | POA: Diagnosis not present

## 2022-01-23 DIAGNOSIS — D500 Iron deficiency anemia secondary to blood loss (chronic): Secondary | ICD-10-CM | POA: Diagnosis not present

## 2022-01-23 DIAGNOSIS — E039 Hypothyroidism, unspecified: Secondary | ICD-10-CM | POA: Diagnosis not present

## 2022-01-23 DIAGNOSIS — E876 Hypokalemia: Secondary | ICD-10-CM | POA: Diagnosis not present

## 2022-01-23 DIAGNOSIS — R531 Weakness: Secondary | ICD-10-CM

## 2022-01-23 DIAGNOSIS — N179 Acute kidney failure, unspecified: Secondary | ICD-10-CM | POA: Diagnosis not present

## 2022-01-23 DIAGNOSIS — K7460 Unspecified cirrhosis of liver: Secondary | ICD-10-CM | POA: Diagnosis not present

## 2022-01-23 DIAGNOSIS — I10 Essential (primary) hypertension: Secondary | ICD-10-CM | POA: Diagnosis not present

## 2022-01-23 LAB — CBC
HCT: 26.4 % — ABNORMAL LOW (ref 36.0–46.0)
Hemoglobin: 8.1 g/dL — ABNORMAL LOW (ref 12.0–15.0)
MCH: 25.7 pg — ABNORMAL LOW (ref 26.0–34.0)
MCHC: 30.7 g/dL (ref 30.0–36.0)
MCV: 83.8 fL (ref 80.0–100.0)
Platelets: 157 10*3/uL (ref 150–400)
RBC: 3.15 MIL/uL — ABNORMAL LOW (ref 3.87–5.11)
RDW: 18.2 % — ABNORMAL HIGH (ref 11.5–15.5)
WBC: 6.8 10*3/uL (ref 4.0–10.5)
nRBC: 0 % (ref 0.0–0.2)

## 2022-01-23 LAB — UREA NITROGEN, URINE: Urea Nitrogen, Ur: 874 mg/dL

## 2022-01-23 LAB — GLUCOSE, CAPILLARY
Glucose-Capillary: 89 mg/dL (ref 70–99)
Glucose-Capillary: 129 mg/dL — ABNORMAL HIGH (ref 70–99)

## 2022-01-23 LAB — BASIC METABOLIC PANEL
Anion gap: 6 (ref 5–15)
BUN: 20 mg/dL (ref 8–23)
CO2: 23 mmol/L (ref 22–32)
Calcium: 8.4 mg/dL — ABNORMAL LOW (ref 8.9–10.3)
Chloride: 109 mmol/L (ref 98–111)
Creatinine, Ser: 1.53 mg/dL — ABNORMAL HIGH (ref 0.44–1.00)
GFR, Estimated: 36 mL/min — ABNORMAL LOW (ref >60)
Glucose, Bld: 86 mg/dL (ref 70–99)
Potassium: 4.7 mmol/L (ref 3.5–5.1)
Sodium: 138 mmol/L (ref 135–145)

## 2022-01-23 LAB — HEMOGLOBIN AND HEMATOCRIT, BLOOD
HCT: 26.9 % — ABNORMAL LOW (ref 36.0–46.0)
Hemoglobin: 8.3 g/dL — ABNORMAL LOW (ref 12.0–15.0)

## 2022-01-23 LAB — MAGNESIUM: Magnesium: 1.8 mg/dL (ref 1.7–2.4)

## 2022-01-23 NOTE — TOC Progression Note (Addendum)
Transition of Care (TOC) - Progression Note  ? ? ?Patient Details  ?Name: Mindel Friscia ?MRN: [id number] ?Date of Birth: [birth year] ? ?Transition of Care (TOC) CM/SW Contact  ?Milas Gain, LCSWA ?Phone Number: ?01/23/2022, 1:27 PM ? ?Clinical Narrative:    ? ?Update- Patient has now decided to go home with hh services. CSW also spoke with patients daughter who also confirmed patient would like to return home with hh services. CSW informed CM and Crystal with [place] to cancel starting insurance authorization. ? ?CSW spoke with patient and provided SNF bed offers. Patient chose SNF placement at [facility]. CSW spoke with Crystal at [facility] confirmed they can accept patient for SNF placement when medically ready for dc. Crystal started insurance authorization for patient. CSW will continue to follow and assist with patients dc planning needs. ? ?Expected Discharge Plan: [address] ?Barriers to Discharge: Continued Medical Work up ? ?Expected Discharge Plan and Services ?Expected Discharge Plan: [place] ?In-house Referral: Clinical Social Work ?  ?  ?Living arrangements for the past 2 months: [place] ?Expected Discharge Date: 01/25/22               ?  ?  ?  ?  ?  ?  ?  ?  ?  ?  ? ? ?Social Determinants of Health (SDOH) Interventions ?  ? ?Readmission Risk Interventions ?   ? View : No data to display.  ?  ?  ?  ? ? ?

## 2022-01-23 NOTE — Progress Notes (Signed)
Physical Therapy Treatment ?Patient Details ?Name: Diane Pruitt ?MRN: [id number] ?DOB: July 04, 1951 ?Today's Date: 01/23/2022 ? ? ?History of Present Illness 71 yo female adm 3/26 with lightheadedness with AKI and anemia. PMhx: Afib, CVA with L sided weakness, anemia, gout, DM, CKD, dizziness, liver cirrhosis, MDD, HTN ? ?  ?PT Comments  ? ? Pt reports improved energy this session with ability to get OOB, walk and up to chair. Pt fatigued with all activity end of session and reports continued lack of endurance for daily activities at home. Pt scores as a high fall risk with Berg assessment and will benefit from AD at all times with gait and discussed during session with pt initially declining attempt at RW this session but agreeable after session. Pt reports her lack of mobility at home is not related to depression or self-limiting behavior but family not sure that this is accurate. Pt educated for repeated sit to stands for strengthening and endurance as well as seated HEP. Pt and family still request ST-SNF to maximize function and mobility and if this is not possible HHPT with PCA and rollator recommended. Will continue to follow.  ?  ?Recommendations for follow up therapy are one component of a multi-disciplinary discharge planning process, led by the attending physician.  Recommendations may be updated based on patient status, additional functional criteria and insurance authorization. ? ?Follow Up Recommendations ? Skilled nursing-short term rehab (<3 hours/day) ?  ?  ?Assistance Recommended at Discharge Intermittent Supervision/Assistance  ?Patient can return home with the following A little help with walking and/or transfers;A little help with bathing/dressing/bathroom;Assistance with cooking/housework;Assist for transportation ?  ?Equipment Recommendations ? Rollator (4 wheels);BSC/3in1  ?  ?Recommendations for Other Services   ? ? ?  ?Precautions / Restrictions Precautions ?Precautions: Fall  ?   ? ?Mobility ? Bed Mobility ?Overal bed mobility: Modified Independent ?  ?  ?  ?  ?  ?  ?  ?  ? ?Transfers ?Overall transfer level: Needs assistance ?  ?Transfers: Sit to/from Stand ?Sit to Stand: Supervision ?  ?  ?  ?  ?  ?General transfer comment: supervision for safety. Pt performed 5 repeated sit to stands in 34 sec with reliance on UE support ?  ? ?Ambulation/Gait ?Ambulation/Gait assistance: Min guard ?Gait Distance (Feet): 150 Feet ?Assistive device: None ?Gait Pattern/deviations: Step-through pattern, Decreased stride length ?  ?Gait velocity interpretation: <1.8 ft/sec, indicate of risk for recurrent falls ?  ?General Gait Details: pt with initial furniture walking and then in hall able to walk without UE support with close guarding with increased sway and fatigue ? ? ?Stairs ?  ?  ?  ?  ?  ? ? ?Wheelchair Mobility ?  ? ?Modified Rankin (Stroke Patients Only) ?  ? ? ?  ?Balance Overall balance assessment: Needs assistance ?  ?Sitting balance-Leahy Scale: Good ?Sitting balance - Comments: static sitting without UE support ?  ?Standing balance support: No upper extremity supported ?Standing balance-Leahy Scale: Fair ?Standing balance comment: static standing without LOB, LOB with challenges ?Single Leg Stance - Right Leg: 0 ?Single Leg Stance - Left Leg: 0 ?Tandem Stance - Right Leg: 3 ?Tandem Stance - Left Leg: 0 ?Rhomberg - Eyes Opened: 72 ?Rhomberg - Eyes Closed: 0 ?  ?  ?Standardized Balance Assessment ?Standardized Balance Assessment : Merrilee Jansky Balance Test ?Merrilee Jansky Balance Test ?Sit to Stand: Able to stand  independently using hands ?Standing Unsupported: Able to stand safely 2 minutes ?Sitting with Back Unsupported but Feet Supported on Floor  or Stool: Able to sit safely and securely 2 minutes ?Stand to Sit: Controls descent by using hands ?Transfers: Able to transfer safely, definite need of hands ?Standing Unsupported with Eyes Closed: Needs help to keep from falling ?Standing Ubsupported with Feet  Together: Able to place feet together independently and stand 1 minute safely ?From Standing, Reach Forward with Outstretched Arm: Can reach forward >12 cm safely (5") ?From Standing Position, Pick up Object from Floor: Able to pick up shoe, needs supervision ?From Standing Position, Turn to Look Behind Over each Shoulder: Turn sideways only but maintains balance ?Turn 360 Degrees: Needs close supervision or verbal cueing ?Standing Unsupported, Alternately Place Feet on Step/Stool: Needs assistance to keep from falling or unable to try ?Standing Unsupported, One Foot in Front: Loses balance while stepping or standing ?Standing on One Leg: Unable to try or needs assist to prevent fall ?Total Score: 30 ?  ?  ? ?  ?Cognition Arousal/Alertness: Awake/alert ?Behavior During Therapy: Flat affect ?Overall Cognitive Status: Impaired/Different from baseline ?Area of Impairment: Safety/judgement ?  ?  ?  ?  ?  ?  ?  ?  ?  ?  ?  ?  ?Safety/Judgement: Decreased awareness of deficits ?  ?  ?  ?  ?  ? ?  ?Exercises General Exercises - Lower Extremity ?Long Arc Quad: AROM, Both, Seated, 20 reps ?Hip Flexion/Marching: AROM, Both, Seated, 20 reps ? ?  ?General Comments   ?  ?  ? ?Pertinent Vitals/Pain Pain Assessment ?Pain Assessment: No/denies pain  ? ? ?Home Living Family/patient expects to be discharged to:: Private residence ?Living Arrangements: Alone ?Available Help at Discharge: Family;Available PRN/intermittently ?Type of Home: House ?Home Access: Stairs to enter ?Entrance Stairs-Rails: None ?Entrance Stairs-Number of Steps: 1 ?  ?Home Layout: One level ?Home Equipment: Kasandra Knudsen - single point ?Additional Comments: reports 2 falls this year (jan 2023), in garage and passed out in bedroom per pt report  ?  ?Prior Function    ?  ?  ?   ? ?PT Goals (current goals can now be found in the care plan section) Progress towards PT goals: Progressing toward goals ? ?  ?Frequency ? ? ? Min 3X/week ? ? ? ?  ?PT Plan Current plan remains  appropriate  ? ? ?Co-evaluation   ?  ?  ?  ?  ? ?  ?AM-PAC PT "6 Clicks" Mobility   ?Outcome Measure ? Help needed turning from your back to your side while in a flat bed without using bedrails?: A Little ?Help needed moving from lying on your back to sitting on the side of a flat bed without using bedrails?: A Little ?Help needed moving to and from a bed to a chair (including a wheelchair)?: A Little ?Help needed standing up from a chair using your arms (e.g., wheelchair or bedside chair)?: A Little ?Help needed to walk in hospital room?: A Little ?Help needed climbing 3-5 steps with a railing? : Total ?6 Click Score: 16 ? ?  ?End of Session   ?Activity Tolerance: Patient limited by fatigue ?Patient left: in chair;with call bell/phone within reach;with family/visitor present ?Nurse Communication: Mobility status ?PT Visit Diagnosis: Muscle weakness (generalized) (M62.81);Difficulty in walking, not elsewhere classified (R26.2);Other abnormalities of gait and mobility (R26.89) ?  ? ? ?Time: 3646-8032 ?PT Time Calculation (min) (ACUTE ONLY): 21 min ? ?Charges:  $Gait Training: 8-22 mins          ?          ? ?  Taraoluwa Thakur P, PT ?Acute Rehabilitation Services ?Pager: [phone] ?Office: [phone] ? ? ? ?Johnney Scarlata B Daelan Gatt ?01/23/2022, 11:05 AM ? ?

## 2022-01-23 NOTE — Evaluation (Signed)
Occupational Therapy Evaluation ?Patient Details ?Name: Diane Pruitt ?MRN: [id number] ?DOB: [birth year] ?Today's Date: 01/23/2022 ? ? ?History of Present Illness 71 yo female adm 3/26 with lightheadedness with AKI and anemia. PMhx: Afib, CVA with L sided weakness, anemia, gout, DM, CKD, dizziness, liver cirrhosis, MDD, HTN  ? ?Clinical Impression ?  ?Pt/daughter reporting pt requiring assistance for IADLs, limited ADL performance as of recently. Pt has cane, however does not use, reports furniture walking throughout home. Pt currently supervision-min A for ADLs at time of evaluation, supervision for transfers without AD. Pt requiring min cuing during session for safety. Pt/daughter requesting ST-SNF at d/c due to the increased assistance they are having to provide at home, if not possible, HHOT and shower chair. ?   ? ?Recommendations for follow up therapy are one component of a multi-disciplinary discharge planning process, led by the attending physician.  Recommendations may be updated based on patient status, additional functional criteria and insurance authorization.  ? ?Follow Up Recommendations ? Skilled nursing-short term rehab (<3 hours/day) (if not, HHOT)  ?  ?Assistance Recommended at Discharge Intermittent Supervision/Assistance  ?Patient can return home with the following A little help with walking and/or transfers;A little help with bathing/dressing/bathroom;Assistance with cooking/housework;Assist for transportation ? ?  ?Functional Status Assessment ? Patient has had a recent decline in their functional status and demonstrates the ability to make significant improvements in function in a reasonable and predictable amount of time.  ?Equipment Recommendations ? Tub/shower seat  ?  ?Recommendations for Other Services   ? ? ?  ?Precautions / Restrictions Precautions ?Precautions: Fall ?Restrictions ?Weight Bearing Restrictions: No  ? ?  ? ?Mobility Bed Mobility ?  ?  ?  ?  ?  ?  ?  ?General bed mobility  comments: in chair upon arrival ?  ? ?Transfers ?Overall transfer level: Needs assistance ?  ?Transfers: Sit to/from Stand ?Sit to Stand: Supervision ?  ?  ?  ?  ?  ?  ?  ? ?  ?Balance Overall balance assessment: Needs assistance ?  ?Sitting balance-Leahy Scale: Normal ?Sitting balance - Comments: static sitting without UE support ?  ?Standing balance support: No upper extremity supported ?Standing balance-Leahy Scale: Fair ?Standing balance comment: use of counters surfaces for support ?  ?  ?  ?  ?  ?  ?  ?  ?  ?  ?  ?   ? ?ADL either performed or assessed with clinical judgement  ? ?ADL Overall ADL's : Needs assistance/impaired ?Eating/Feeding: Set up;Sitting ?  ?Grooming: Set up;Standing;Wash/dry hands;Oral care ?Grooming Details (indicate cue type and reason): completed standing at sink ?Upper Body Bathing: Set up;Sitting ?  ?Lower Body Bathing: Minimal assistance;Sitting/lateral leans ?  ?Upper Body Dressing : Set up;Sitting ?Upper Body Dressing Details (indicate cue type and reason): to doff gown ?Lower Body Dressing: Minimal assistance;Sitting/lateral leans;Sit to/from stand ?  ?Toilet Transfer: Sales executive;Ambulation ?  ?Toileting- Clothing Manipulation and Hygiene: Supervision/safety;Sitting/lateral lean;Sit to/from stand ?  ?  ?  ?Functional mobility during ADLs: Min guard;Supervision/safety ?   ? ? ? ?Vision   ?Vision Assessment?: No apparent visual deficits  ?   ?Perception   ?  ?Praxis   ?  ? ?Pertinent Vitals/Pain Pain Assessment ?Pain Assessment: No/denies pain  ? ? ? ?Hand Dominance Left ?  ?Extremity/Trunk Assessment Upper Extremity Assessment ?Upper Extremity Assessment: Generalized weakness ?  ?Lower Extremity Assessment ?Lower Extremity Assessment: Defer to PT evaluation ?  ?Cervical / Trunk Assessment ?Cervical / Trunk Assessment: Normal ?  ?  Communication Communication ?Communication: No difficulties ?  ?Cognition Arousal/Alertness: Awake/alert ?Behavior During Therapy:  Flat affect ?Overall Cognitive Status: Impaired/Different from baseline ?Area of Impairment: Safety/judgement ?  ?  ?  ?  ?  ?  ?  ?  ?  ?  ?  ?  ?Safety/Judgement: Decreased awareness of deficits ?  ?  ?  ?  ?  ?General Comments  VSS on RA ? ?  ?Exercises   ?  ?Shoulder Instructions    ? ? ?Home Living Family/patient expects to be discharged to:: Private residence ?Living Arrangements: Alone ?Available Help at Discharge: Family;Available PRN/intermittently ?Type of Home: House ?Home Access: Stairs to enter ?Entrance Stairs-Number of Steps: 1 ?Entrance Stairs-Rails: None ?Home Layout: One level ?  ?  ?Bathroom Shower/Tub: Gaffer;Tub only ?  ?Bathroom Toilet: Standard ?  ?  ?Home Equipment: Kasandra Knudsen - single point ?  ?Additional Comments: reports 2 falls this year (jan 2023), in garage and passed out in bedroom per pt report ?  ? ?  ?Prior Functioning/Environment Prior Level of Function : Needs assist ?  ?  ?  ?  ?  ?  ?Mobility Comments: pt reports very limited mobility and barely getting OOB for the last month, furniture walking ?ADLs Comments: has performed very limited bathing (in tub), son has been assisting with meals and daughter assisting with cleaning for the last month. Until Mary Sella was independent ?  ? ?  ?  ?OT Problem List: Decreased strength;Decreased range of motion;Decreased activity tolerance;Impaired balance (sitting and/or standing) ?  ?   ?OT Treatment/Interventions: Self-care/ADL training;DME and/or AE instruction;Therapeutic exercise;Therapeutic activities;Patient/family education;Balance training  ?  ?OT Goals(Current goals can be found in the care plan section) Acute Rehab OT Goals ?Patient Stated Goal: none stated ?OT Goal Formulation: With patient ?Time For Goal Achievement: 02/06/22 ?Potential to Achieve Goals: Good ?ADL Goals ?Pt Will Perform Upper Body Dressing: sitting;standing;with modified independence ?Pt Will Perform Lower Body Dressing: with supervision;sitting/lateral leans;sit  to/from stand ?Pt Will Transfer to Toilet: with modified independence;ambulating;regular height toilet ?Pt Will Perform Tub/Shower Transfer: with supervision;shower seat;ambulating;Shower transfer  ?OT Frequency: Min 2X/week ?  ? ?Co-evaluation   ?  ?  ?  ?  ? ?  ?AM-PAC OT "6 Clicks" Daily Activity     ?Outcome Measure Help from another person eating meals?: None ?Help from another person taking care of personal grooming?: A Little ?Help from another person toileting, which includes using toliet, bedpan, or urinal?: A Little ?Help from another person bathing (including washing, rinsing, drying)?: A Little ?Help from another person to put on and taking off regular upper body clothing?: A Little ?Help from another person to put on and taking off regular lower body clothing?: A Little ?6 Click Score: 19 ?  ?End of Session Equipment Utilized During Treatment: Gait belt;Rolling walker (2 wheels) ?Nurse Communication: Mobility status ? ?Activity Tolerance: Patient tolerated treatment well ?Patient left: in chair;with call bell/phone within reach;with family/visitor present ? ?OT Visit Diagnosis: Unsteadiness on feet (R26.81);Other abnormalities of gait and mobility (R26.89);Muscle weakness (generalized) (M62.81)  ?              ?Time: 1050-1110 ?OT Time Calculation (min): 20 min ?Charges:  OT General Charges ?$OT Visit: 1 Visit ?OT Evaluation ?$OT Eval Low Complexity: 1 Low ? ?Lynnda Child, OTD, OTR/L ?Acute Rehab ?[phone] ? ?Kaylyn Lim ?01/23/2022, 12:25 PM ?

## 2022-01-23 NOTE — Plan of Care (Signed)
  Problem: Elimination: Goal: Will not experience complications related to bowel motility Outcome: Progressing Goal: Will not experience complications related to urinary retention Outcome: Progressing   

## 2022-01-23 NOTE — Progress Notes (Addendum)
?PROGRESS NOTE ? ? ? ?Diane Pruitt  JOI:[id number] DOB: [birth year] DOA: 01/21/2022 ?PCP: [name], MD  ? ? ?Brief Narrative:  ? 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  Patient has had EGD on 01/05/2022 and was recently admitted between 3/9-3/11 in the hospital for acute blood loss anemia and diagnosis of new liver cirrhosis.  Patient was initiated on Lasix and spironolactone at that time and was resumed on her home regimen of Eliquis.  On this presentation, patient had been complaining of 1 week of progressive weakness and lightheadedness.  Initially in the ED patient was clinically volume depleted.  Creatinine was elevated at 1.6 from baseline 1.1.  Hemoglobin was low at 7.7.  Patient was then admitted hospital for acute kidney injury, volume depletion and worsening anemia.   ?  ?Assessment and Plan: ? ? Acute renal failure superimposed on stage 3a chronic kidney disease ([place]) ?Slightly improved.  Creatinine 1.5 today.  Likely secondary to volume depletion.  Continue intake and output charting.  Avoid nephrotoxic medications.  Received 1 unit of packed RBC. Continue to hold spironolactone and Lasix for now. ?Lab Results  ?Component Value Date  ? CREATININE 1.53 (H) 01/23/2022  ? CREATININE 1.70 (H) 01/22/2022  ? CREATININE 1.68 (H) 01/21/2022  ?  ? ?Iron deficiency anemia due to chronic blood loss ?Hemoglobin of 7.7 on presentation down from 8.0.  Hemoglobin today at 8.1.  No blood in the stool reported.  Recent EGD with portal hypertensive gastropathy.  On chronic Eliquis for atrial fibrillation at home, GI recommends holding of Eliquis for now.  Patient received 1 unit of packed RBC on 01/22/2022.    Continue Protonix twice daily for now.  Continue iron supplementation GI is following. ? ?Cirrhosis of liver ([place]) ?Recent diagnosis during previous hospitalization in early March, presumably thought to be secondary to alcohol use.  Patient with hypertensive gastropathy from EGD. ? ?Question diagnosis of diabetes.  Family stated that the patient was on metformin for weight management and no history of diabetes in the past.  Hemoglobin A1c of 4.5 at this time.  We will discontinue sliding scale insulin. ? ?Hypokalemia ?Significant hypokalemia on presentation.  Has been aggressively repleted.  Potassium 4.7 this morning.  Magnesium 1.8 today. ? ?Essential hypertension ?Blood pressure was low on presentation.  Continue to hold antihypertensives including diuretics and Cardizem for now patient states that her blood pressure is borderline low all the time ? ?Paroxysmal atrial fibrillation ([facility]) ?Continue amiodarone.  Hold Cardizem due to low blood pressure.  Hold Eliquis for now. ? ?Acquired hypothyroidism ?Continue Synthroid  ? ?hyperlipidemia  ?Continue Crestor ? ?Deconditioning, debility.  Patient has been seen by physical therapy recommend skilled nursing facility on discharge. ? ? DVT prophylaxis: SCDs Start: 01/21/22 2258 ? ? ?Code Status:   ?  Code Status: Full Code ? ?Disposition:  skilled nursing facility ? ?Status is: Observation ? ?The patient will require care spanning > 2 midnights and should be moved to inpatient because: Significant anemia, renal failure, need for further monitoring, plan for skilled nursing facility placement. ? ? family Communication:  ?Spoke with the patient's daughter at bedside on 01/22/2022. ? ?Consultants:  ?GI ? ?Procedures:  ?Transfusion of 1 unit of packed RBC ? ?Antimicrobials:  ?None ? ?Anti-infectives (From  admission, onward)  ? ? None  ? ?  ? ? ?Subjective: ?Today, patient was seen and examined at bedside.  Denies any dizziness lightheadedness chest pain shortness of breath  has had bowel movement without any blood.  ? ?Objective: ?Vitals:  ? 01/22/22 2347 01/23/22 0412 01/23/22 0822 01/23/22 1052  ?BP: (!) 99/50 (!) 102/59 (!) 97/42   ?Pulse: 67 66  70  ?Resp: '18 14 18   '$ ?Temp: 99.3 ?F (37.4 ?C) 98.4 ?F (36.9 ?C) 97.9 ?F (36.6 ?C)   ?TempSrc: Oral Oral Oral   ?SpO2: 95% 99% 99%   ?Weight:      ?Height:      ? ? ?Intake/Output Summary (Last 24 hours) at 01/23/2022 1233 ?Last data filed at 01/23/2022 0900 ?Gross per 24 hour  ?Intake 480 ml  ?Output --  ?Net 480 ml  ? ?Filed Weights  ? 01/21/22 2129  ?Weight: 75.3 kg  ? ? ?Physical Examination: ? ?General: Obese built, not in obvious distress ?HENT:   Mild pallor noted.  Oral mucosa is moist.  ?Chest:  Clear breath sounds.  Diminished breath sounds bilaterally. No crackles or wheezes.  ?CVS: S1 &S2 heard. No murmur.  Regular rate and rhythm. ?Abdomen: Soft, nontender, nondistended.  Bowel sounds are heard.   ?Extremities: No cyanosis, clubbing or edema.  Peripheral pulses are palpable. ?Psych: Alert, awake and oriented, normal mood ?CNS:  No cranial nerve deficits.  Power equal in all extremities.   ?Skin: Warm and dry.  No rashes noted. ? ?Data Reviewed:  ? ?CBC: ?Recent Labs  ?Lab 01/21/22 ?1525 01/22/22 ?0058 01/22/22 ?0831 01/22/22 ?1733 01/23/22 ?4709  ?WBC 7.5 7.1 8.1  --  6.8  ?NEUTROABS 5.7 4.9  --   --   --   ?HGB 7.7* 6.9* 8.3* 8.4* 8.1*  ?HCT 26.1* 22.2* 27.1* 27.6* 26.4*  ?MCV 85.9 85.7 83.6  --  83.8  ?PLT 168 150 183  --  157  ? ? ?Basic Metabolic Panel: ?Recent Labs  ?Lab 01/21/22 ?1525 01/22/22 ?0058 01/23/22 ?6283  ?NA 135 136 138  ?K 3.3* 2.9* 4.7  ?CL 100 105 109  ?CO2 '25 22 23  '$ ?GLUCOSE 95 113* 86  ?BUN 25* 22 20  ?CREATININE 1.68* 1.70* 1.53*  ?CALCIUM 8.7* 8.2* 8.4*  ?MG  --  1.9 1.8  ? ? ?Liver Function Tests: ?Recent Labs  ?Lab 01/21/22 ?1525 01/22/22 ?0058  ?AST 94* 107*  ?ALT 43 46*  ?ALKPHOS 54 56  ?BILITOT 0.8 0.7  ?PROT 6.7 5.6*  ?ALBUMIN 3.5 2.3*  ? ? ? ?Radiology Studies: ?No results found. ? ? ? LOS: 0 days   ? ? ?Flora Lipps, MD ?[facility] ?01/23/2022, 12:33 PM  ? ? ?

## 2022-01-23 NOTE — TOC Progression Note (Signed)
Transition of Care (TOC) - Progression Note  ? ? ?Patient Details  ?Name: Diane Pruitt ?MRN: [id number] ?Date of Birth: [birth year] ? ?Transition of Care (TOC) CM/SW Contact  ?Milas Gain, LCSWA ?Phone Number: ?01/23/2022, 11:46 AM ? ?Clinical Narrative:    ? ?Patients passr is under review. CSW submitted requested clinicals to passr for review. CSW awaiting determination. ? ?Expected Discharge Plan: [place] ?Barriers to Discharge: Continued Medical Work up ? ?Expected Discharge Plan and Services ?Expected Discharge Plan: [place] ?In-house Referral: Clinical Social Work ?  ?  ?Living arrangements for the past 2 months: [place] ?Expected Discharge Date: 01/25/22               ?  ?  ?  ?  ?  ?  ?  ?  ?  ?  ? ? ?Social Determinants of Health (SDOH) Interventions ?  ? ?Readmission Risk Interventions ?   ? View : No data to display.  ?  ?  ?  ? ? ?

## 2022-01-23 NOTE — Progress Notes (Signed)
[facility] Progress Note ? ?Diane Pruitt 71 y.o. 1951-01-04 ? ?CC: Symptomatic anemia, cirrhosis ? ? ?Subjective: ?Patient states she is doing well this morning.  Has had bowel movements without overt bleeding.  Denies abdominal pain, nausea, vomiting, fever, chills. ? ?ROS : Review of Systems  ?Constitutional:  Negative for chills and fever.  ?Gastrointestinal:  Negative for abdominal pain, blood in stool, constipation, diarrhea, heartburn, melena, nausea and vomiting.   ? ? ?Objective: ?Vital signs in last 24 hours: ?Vitals:  ? 01/23/22 0412 01/23/22 0822  ?BP: (!) 102/59 (!) 97/42  ?Pulse: 66   ?Resp: 14 13  ?Temp: 98.4 ?F (36.9 ?C) 97.9 ?F (36.6 ?C)  ?SpO2: 99%   ? ? ?Physical Exam: ? ?General:  Alert, cooperative, no distress, appears stated age  ?Head:  Normocephalic, without obvious abnormality, atraumatic  ?Eyes:  Anicteric sclera, EOM's intact, conjunctival pallor  ?Lungs:   Clear to auscultation bilaterally, respirations unlabored  ?Heart:  Regular rate and rhythm, S1, S2 normal  ?Abdomen:   Soft, non-tender, mildly distended. bowel sounds active all four quadrants,  no masses,   ? ? ?Lab Results: ?Recent Labs  ?  01/22/22 ?0058 01/23/22 ?5883  ?NA 136 138  ?K 2.9* 4.7  ?CL 105 109  ?CO2 22 23  ?GLUCOSE 113* 86  ?BUN 22 20  ?CREATININE 1.70* 1.53*  ?CALCIUM 8.2* 8.4*  ?MG 1.9 1.8  ? ?Recent Labs  ?  01/21/22 ?1525 01/22/22 ?0058  ?AST 94* 107*  ?ALT 43 46*  ?ALKPHOS 54 56  ?BILITOT 0.8 0.7  ?PROT 6.7 5.6*  ?ALBUMIN 3.5 2.3*  ? ?Recent Labs  ?  01/21/22 ?1525 01/22/22 ?0058 01/22/22 ?0831 01/22/22 ?1733 01/23/22 ?2549  ?WBC 7.5 7.1 8.1  --  6.8  ?NEUTROABS 5.7 4.9  --   --   --   ?HGB 7.7* 6.9* 8.3* 8.4* 8.1*  ?HCT 26.1* 22.2* 27.1* 27.6* 26.4*  ?MCV 85.9 85.7 83.6  --  83.8  ?PLT 168 150 183  --  157  ? ?Recent Labs  ?  01/22/22 ?0058  ?LABPROT 19.6*  ?INR 1.7*  ? ? ? ? ?Assessment ?Symptomatic anemia ?- hgb 8.1 (8.4) (8.0 upon hospital discharge 3/11). ?-BUN 20, creatinine 1.53 ?- Eliquis  has been held ?- EGD 3/10 showing portal hypertensive gastropathy without active bleeding. ? ?Cirrhosis ?- HGB 8.1 Platelets 157 ?AST 107 ALT 46  ?Alkphos 56 TBili 0.7 ?GFR 36  ?INR 01/22/2022 1.7  ?- MELD 16 ? ?Plan: ?Continue to hold apixaban ?Continue pantoprazole 40 Mg p.o. twice daily ?Continue Nu-Iron 150 Mg p.o. daily ?Hgb currently stable at this time.  Continue to trend CBC and transfuse to keep above 7.0 ?[facility] will follow ? ?Diane Pruitt Radford Pax PA-C ?01/23/2022, 9:19 AM ? ?Contact #  [phone]  ?

## 2022-01-24 DIAGNOSIS — E785 Hyperlipidemia, unspecified: Secondary | ICD-10-CM

## 2022-01-24 DIAGNOSIS — K7460 Unspecified cirrhosis of liver: Secondary | ICD-10-CM | POA: Diagnosis not present

## 2022-01-24 DIAGNOSIS — M109 Gout, unspecified: Secondary | ICD-10-CM | POA: Diagnosis not present

## 2022-01-24 DIAGNOSIS — R188 Other ascites: Secondary | ICD-10-CM | POA: Diagnosis not present

## 2022-01-24 DIAGNOSIS — N1831 Chronic kidney disease, stage 3a: Secondary | ICD-10-CM | POA: Diagnosis not present

## 2022-01-24 DIAGNOSIS — N179 Acute kidney failure, unspecified: Secondary | ICD-10-CM | POA: Diagnosis not present

## 2022-01-24 DIAGNOSIS — E039 Hypothyroidism, unspecified: Secondary | ICD-10-CM | POA: Diagnosis not present

## 2022-01-24 DIAGNOSIS — I10 Essential (primary) hypertension: Secondary | ICD-10-CM | POA: Diagnosis not present

## 2022-01-24 LAB — COMPREHENSIVE METABOLIC PANEL
ALT: 44 U/L (ref 0–44)
AST: 107 U/L — ABNORMAL HIGH (ref 15–41)
Albumin: 2.4 g/dL — ABNORMAL LOW (ref 3.5–5.0)
Alkaline Phosphatase: 62 U/L (ref 38–126)
Anion gap: 8 (ref 5–15)
BUN: 20 mg/dL (ref 8–23)
CO2: 19 mmol/L — ABNORMAL LOW (ref 22–32)
Calcium: 8.3 mg/dL — ABNORMAL LOW (ref 8.9–10.3)
Chloride: 110 mmol/L (ref 98–111)
Creatinine, Ser: 1.56 mg/dL — ABNORMAL HIGH (ref 0.44–1.00)
GFR, Estimated: 36 mL/min — ABNORMAL LOW (ref >60)
Glucose, Bld: 76 mg/dL (ref 70–99)
Potassium: 5.3 mmol/L — ABNORMAL HIGH (ref 3.5–5.1)
Sodium: 137 mmol/L (ref 135–145)
Total Bilirubin: 0.9 mg/dL (ref 0.3–1.2)
Total Protein: 5.5 g/dL — ABNORMAL LOW (ref 6.5–8.1)

## 2022-01-24 LAB — CBC
HCT: 26.1 % — ABNORMAL LOW (ref 36.0–46.0)
Hemoglobin: 7.8 g/dL — ABNORMAL LOW (ref 12.0–15.0)
MCH: 25.7 pg — ABNORMAL LOW (ref 26.0–34.0)
MCHC: 29.9 g/dL — ABNORMAL LOW (ref 30.0–36.0)
MCV: 85.9 fL (ref 80.0–100.0)
Platelets: 158 10*3/uL (ref 150–400)
RBC: 3.04 MIL/uL — ABNORMAL LOW (ref 3.87–5.11)
RDW: 18.1 % — ABNORMAL HIGH (ref 11.5–15.5)
WBC: 7.1 10*3/uL (ref 4.0–10.5)
nRBC: 0 % (ref 0.0–0.2)

## 2022-01-24 LAB — MAGNESIUM: Magnesium: 1.9 mg/dL (ref 1.7–2.4)

## 2022-01-24 MED ORDER — DEXTROSE-NACL 5-0.45 % IV SOLN: INTRAVENOUS | Status: DC

## 2022-01-24 MED ORDER — SODIUM ZIRCONIUM CYCLOSILICATE 10 G PO PACK
10.0000 g | PACK | Freq: Once | ORAL | Status: AC
  Administered 2022-01-24: 10 g via ORAL
  Filled 2022-01-24: qty 1

## 2022-01-24 NOTE — Progress Notes (Signed)
Mobility Specialist Progress Note  ? ? 01/24/22 1651  ?Mobility  ?Activity Ambulated with assistance in hallway  ?Level of Assistance Standby assist, set-up cues, supervision of patient - no hands on  ?Assistive Device Four wheel walker  ?Distance Ambulated (ft) 150 ft  ?Activity Response Tolerated well  ?$Mobility charge 1 Mobility  ? ?Pt received in bed and agreeable. No complaints on walk. Returned to bed with call bell in reach.   ? ?Hildred Alamin ?Mobility Specialist  ?  ?

## 2022-01-24 NOTE — Progress Notes (Signed)
?Progress Note ? ? ?Patient: Diane Pruitt OEU:[id number] DOB: 1951-10-24 DOA: 01/21/2022     0 ?DOS: the patient was seen and examined on 01/24/2022 ?  ?Brief hospital course: ?No notes on file ? ?Assessment and Plan: ?* Acute renal failure superimposed on stage 3a chronic kidney disease ([place]) ?Patient presenting with several days of progressively worsening generalized weakness and lightheadedness shortly after being initiated on Lasix and spironolactone ?Clinically, patient appears volume depleted and is relatively hypotensive with notably elevated creatinine of 1.68, up from baseline of approximately 1.0 suggestive of acute kidney injury ?Temporarily holding diuretics ?Gentle intravenous hydration with isotonic fluids ?Strict input and output monitoring ?Avoiding nephrotoxic agents if at all possible ?Serial chemistries being performed to monitor renal function and electrolytes ? ?Iron deficiency anemia due to chronic blood loss ?Patient initially presented with hemoglobin of 7.7, down from 8.0 recently ?While this is does not appear abnormal at first glance, patient is volume depleted with concerns that hemoglobin may actually be much lower than this once patient receives fluids ?EGD performed in early March revealed portal hypertensive gastropathy without active bleeding ?Patient is additionally on chronic Eliquis therapy due to known history of atrial fibrillation ?Seen by GI with recommendations to hold anticoagulation for another 10 days ?Monitoring hemoglobin and hematocrit with serial CBCs ?Type and screen obtained ?We will transfuse if hemoglobin drops below 7  ?Intravenous proton pump inhibitor  ? ? ?Cirrhosis of liver ([place]) ?Recent diagnosis during previous hospitalization in early March ?According to review of notes, cirrhosis is felt to presumably be secondary to alcohol use ?Temporarily holding recently initiated regimen of diuretics including Lasix and spironolactone.  Once patient is euvolemic,  patient can likely be placed on a more mild regimen of these medications ?Of note, cirrhosis is complicated by presence of portal hypertensive gastropathy as noted the EGD earlier in the month by Dr. Therisa Doyne. ? ?Hypokalemia ?Resolved ? ? ?Essential hypertension ?Holding diuretics as noted above ?Continuing home regimen of diltiazem mainly for rate control as blood pressure tolerates (will hold for SBP < 100) ?As needed intravenous antihypertensives for markedly elevated blood pressures ? ? ? ? ?Paroxysmal atrial fibrillation (HCC) ?Currently rate controlled even though she has not taken Diltiazem for several days.   ?Continuing home amiodarone.   ?Temporarily holding home regimen of Eliquis as noted above as we evaluate for any evidence of bleeding.  Per GI, will plan on restarting anticoagulation in the next 10 days ?Monitoring on telemetry ? ? ?Acquired hypothyroidism ?Resume home regimen of Synthroid ? ? ? ?Hyperlipidemia ?Continuing home regimen of lipid lowering therapy. ? ? ? ? ? ?  ? ?Subjective: Denies any chest pain or shortness of breath.  Reports that she is having dark stools. ? ?Physical Exam: ?Vitals:  ? 01/23/22 2346 01/24/22 0345 01/24/22 0840 01/24/22 1821  ?BP: (!) 101/52 98/61 (!) 97/57 (!) 108/58  ?Pulse: 69 65 61 73  ?Resp: '13 16 15 '$ (!) 21  ?Temp: 98.6 ?F (37 ?C) 98.7 ?F (37.1 ?C)  98 ?F (36.7 ?C)  ?TempSrc: Oral Oral  Oral  ?SpO2: 99% 99%    ?Weight:      ?Height:      ? ?General exam: Alert, awake, oriented x 3 ?Respiratory system: Clear to auscultation. Respiratory effort normal. ?Cardiovascular system:RRR. No murmurs, rubs, gallops. ?Gastrointestinal system: Abdomen is nondistended, soft and nontender. No organomegaly or masses felt. Normal bowel sounds heard. ?Central nervous system: Alert and oriented. No focal neurological deficits. ?Extremities: No C/C/E, +pedal pulses ?Skin: No rashes,  lesions or ulcers ?Psychiatry: Judgement and insight appear normal. Mood & affect appropriate.  ? ?Data  Reviewed: ? ?Reviewed CBC with mild downtrend in hemoglobin.  Renal function has mildly improved, but not back to baseline. ? ?Family Communication: No family present ? ?Disposition: ?Status is: Observation ?The patient remains OBS appropriate and will d/c before 2 midnights. ? Planned Discharge Destination: Home and Home with Home Health ? ? ? ?Time spent: 35 minutes ? ?Author: ?Kathie Dike, MD ?01/24/2022 7:29 PM ? ?For on call review [email or web address].  ?

## 2022-01-24 NOTE — TOC Progression Note (Signed)
Transition of Care (TOC) - Progression Note  ? ? ?Patient Details  ?Name: Diane Pruitt ?MRN: [id number] ?Date of Birth: [birth year] ? ?Transition of Care (TOC) CM/SW Contact  ?Bethena Roys, RN ?Phone Number: ?01/24/2022, 9:50 AM ? ?Clinical Narrative: Case Manager spoke with patient and daughter on 01-23-22 regarding home health services. Patient would like to return home with family providing intermittent supervision. Case Manager provided the patient with the Medicare.gov list and the family did not have a preference for agency. Case Manager made the referral with [facility] and the office cannot accept the patient due to insurance is out of network. Case Manger called [facility]) and the office can accept the patient for services. Start of care to begin within 24-48 hours post transition home. MD aware to place orders and F2F in Epic. Family will provide transportation home. No further needs identified at this time.   ? ? ? ?Expected Discharge Plan: [place] ?Barriers to Discharge: No Barriers Identified ? ?Expected Discharge Plan and Services ?Expected Discharge Plan: [place] ?In-house Referral: Clinical Social Work ?Discharge Planning Services: CM Consult ?Post Acute Care Choice: Home Health ?Living arrangements for the past 2 months: [place] ?Expected Discharge Date: 01/25/22               ?DME Arranged: Walker rolling with seat ?DME Agency: [employer] ?Date DME Agency Contacted: 01/23/22 ?Time DME Agency Contacted: [phone] ?Representative spoke with at DME Agency: Delana Meyer ?HH Arranged: OT, PT ?[place] Agency: [place] ?Date HH Agency Contacted: 01/24/22 ?Time [place]: [phone] ?  ?Readmission Risk Interventions ?   ? View : No data to display.  ?  ?  ?  ? ? ?

## 2022-01-24 NOTE — Progress Notes (Signed)
Occupational Therapy Treatment ?Patient Details ?Name: Diane Pruitt ?MRN: [id number] ?DOB: May 21, 1951 ?Today's Date: 01/24/2022 ? ? ?History of present illness 71 yo female adm 3/26 with lightheadedness with AKI and anemia. PMhx: Afib, CVA with L sided weakness, anemia, gout, DM, CKD, dizziness, liver cirrhosis, MDD, HTN ?  ?OT comments ? Pt is able to complete ADLs with supervision this date, but does fatigue with activity.  Discussed energy conservation strategies.  She reports that family will assist at discharge. Disposition recommendation changed to [place].    ? ?Recommendations for follow up therapy are one component of a multi-disciplinary discharge planning process, led by the attending physician.  Recommendations may be updated based on patient status, additional functional criteria and insurance authorization. ?   ?Follow Up Recommendations ? Home health OT  ?  ?Assistance Recommended at Discharge Intermittent Supervision/Assistance  ?Patient can return home with the following ? A little help with walking and/or transfers;A little help with bathing/dressing/bathroom;Assistance with cooking/housework;Assist for transportation ?  ?Equipment Recommendations ? Tub/shower seat  ?  ?Recommendations for Other Services   ? ?  ?Precautions / Restrictions Precautions ?Precautions: Fall  ? ? ?  ? ?Mobility Bed Mobility ?Overal bed mobility: Independent ?  ?  ?  ?  ?  ?  ?  ?  ? ?Transfers ?  ?  ?  ?  ?  ?  ?  ?  ?  ?  ?  ?  ?Balance Overall balance assessment: Needs assistance ?  ?Sitting balance-Leahy Scale: Normal ?  ?  ?Standing balance support: No upper extremity supported ?Standing balance-Leahy Scale: Fair ?Standing balance comment: able to maintain static standing > 10 mins without LOB ?  ?  ?  ?  ?  ?  ?  ?  ?  ?  ?  ?   ? ?ADL either performed or assessed with clinical judgement  ? ?ADL Overall ADL's : Needs assistance/impaired ?  ?  ?Grooming: Wash/dry hands;Supervision/safety;Standing ?  ?  ?  ?Lower Body  Bathing: Supervison/ safety;Sit to/from stand ?  ?  ?  ?Lower Body Dressing: Supervision/safety;Sit to/from stand ?  ?Toilet Transfer: Supervision/safety;Ambulation;Comfort height toilet ?  ?Toileting- Clothing Manipulation and Hygiene: Supervision/safety;Sitting/lateral lean;Sit to/from stand ?  ?  ?  ?Functional mobility during ADLs: Supervision/safety ?  ?  ? ?Extremity/Trunk Assessment Upper Extremity Assessment ?Upper Extremity Assessment: Generalized weakness ?  ?Lower Extremity Assessment ?Lower Extremity Assessment: Defer to PT evaluation ?  ?  ?  ? ?Vision   ?Vision Assessment?: No apparent visual deficits ?  ?Perception   ?  ?Praxis   ?  ? ?Cognition Arousal/Alertness: Awake/alert ?Behavior During Therapy: Flat affect ?Overall Cognitive Status: No family/caregiver present to determine baseline cognitive functioning ?  ?  ?  ?  ?  ?  ?  ?  ?  ?  ?  ?  ?  ?  ?  ?  ?  ?  ?  ?   ?Exercises   ? ?  ?Shoulder Instructions   ? ? ?  ?General Comments VSS on RA  ? ? ?Pertinent Vitals/ Pain       Pain Assessment ?Pain Assessment: No/denies pain ? ?Home Living   ?  ?  ?  ?  ?  ?  ?  ?  ?  ?  ?  ?  ?  ?  ?  ?  ?  ?  ? ?  ?Prior Functioning/Environment    ?  ?  ?  ?   ? ?  Frequency ? Min 2X/week  ? ? ? ? ?  ?Progress Toward Goals ? ?OT Goals(current goals can now be found in the care plan section) ? Progress towards OT goals: Progressing toward goals ? ?   ?Plan Discharge plan needs to be updated   ? ?Co-evaluation ? ? ?   ?  ?  ?  ?  ? ?  ?AM-PAC OT "6 Clicks" Daily Activity     ?Outcome Measure ? ?   ?  ?  ?  ?  ?  ?  ? ?  ?End of Session   ? ?OT Visit Diagnosis: Unsteadiness on feet (R26.81);Other abnormalities of gait and mobility (R26.89);Muscle weakness (generalized) (M62.81) ?  ?Activity Tolerance Patient tolerated treatment well ?  ?Patient Left in bed;with call bell/phone within reach ?  ?Nurse Communication Mobility status ?  ? ?   ? ?Time: 7591-6384 ?OT Time Calculation (min): 17 min ? ?Charges: OT General  Charges ?$OT Visit: 1 Visit ?OT Treatments ?$Self Care/Home Management : 8-22 mins ? ?Janah Mcculloh C., OTR/L ?Acute Rehabilitation Services ?Pager [phone] ?Office [phone] ? ? ?Reannah Totten M ?01/24/2022, 11:50 AM ?

## 2022-01-25 DIAGNOSIS — K7030 Alcoholic cirrhosis of liver without ascites: Secondary | ICD-10-CM | POA: Diagnosis present

## 2022-01-25 DIAGNOSIS — E785 Hyperlipidemia, unspecified: Secondary | ICD-10-CM | POA: Diagnosis not present

## 2022-01-25 DIAGNOSIS — Z7901 Long term (current) use of anticoagulants: Secondary | ICD-10-CM | POA: Diagnosis not present

## 2022-01-25 DIAGNOSIS — I129 Hypertensive chronic kidney disease with stage 1 through stage 4 chronic kidney disease, or unspecified chronic kidney disease: Secondary | ICD-10-CM | POA: Diagnosis not present

## 2022-01-25 DIAGNOSIS — I959 Hypotension, unspecified: Secondary | ICD-10-CM | POA: Diagnosis not present

## 2022-01-25 DIAGNOSIS — E039 Hypothyroidism, unspecified: Secondary | ICD-10-CM | POA: Diagnosis not present

## 2022-01-25 DIAGNOSIS — Z7983 Long term (current) use of bisphosphonates: Secondary | ICD-10-CM | POA: Diagnosis not present

## 2022-01-25 DIAGNOSIS — K3189 Other diseases of stomach and duodenum: Secondary | ICD-10-CM

## 2022-01-25 DIAGNOSIS — N1831 Chronic kidney disease, stage 3a: Secondary | ICD-10-CM | POA: Diagnosis not present

## 2022-01-25 DIAGNOSIS — K766 Portal hypertension: Secondary | ICD-10-CM | POA: Diagnosis not present

## 2022-01-25 DIAGNOSIS — D500 Iron deficiency anemia secondary to blood loss (chronic): Secondary | ICD-10-CM | POA: Diagnosis not present

## 2022-01-25 DIAGNOSIS — E876 Hypokalemia: Secondary | ICD-10-CM | POA: Diagnosis present

## 2022-01-25 DIAGNOSIS — E1122 Type 2 diabetes mellitus with diabetic chronic kidney disease: Secondary | ICD-10-CM | POA: Diagnosis not present

## 2022-01-25 DIAGNOSIS — I480 Paroxysmal atrial fibrillation: Secondary | ICD-10-CM | POA: Diagnosis present

## 2022-01-25 DIAGNOSIS — N179 Acute kidney failure, unspecified: Secondary | ICD-10-CM | POA: Diagnosis not present

## 2022-01-25 DIAGNOSIS — E1169 Type 2 diabetes mellitus with other specified complication: Secondary | ICD-10-CM | POA: Diagnosis not present

## 2022-01-25 DIAGNOSIS — Z9851 Tubal ligation status: Secondary | ICD-10-CM | POA: Diagnosis not present

## 2022-01-25 DIAGNOSIS — R188 Other ascites: Secondary | ICD-10-CM | POA: Diagnosis not present

## 2022-01-25 DIAGNOSIS — M109 Gout, unspecified: Secondary | ICD-10-CM | POA: Diagnosis present

## 2022-01-25 DIAGNOSIS — K7460 Unspecified cirrhosis of liver: Secondary | ICD-10-CM | POA: Diagnosis not present

## 2022-01-25 DIAGNOSIS — K2270 Barrett's esophagus without dysplasia: Secondary | ICD-10-CM | POA: Diagnosis present

## 2022-01-25 DIAGNOSIS — E782 Mixed hyperlipidemia: Secondary | ICD-10-CM | POA: Diagnosis present

## 2022-01-25 DIAGNOSIS — E869 Volume depletion, unspecified: Secondary | ICD-10-CM | POA: Diagnosis present

## 2022-01-25 DIAGNOSIS — Z79899 Other long term (current) drug therapy: Secondary | ICD-10-CM | POA: Diagnosis not present

## 2022-01-25 DIAGNOSIS — D649 Anemia, unspecified: Secondary | ICD-10-CM | POA: Diagnosis present

## 2022-01-25 DIAGNOSIS — H6121 Impacted cerumen, right ear: Secondary | ICD-10-CM | POA: Diagnosis present

## 2022-01-25 DIAGNOSIS — Z79890 Hormone replacement therapy (postmenopausal): Secondary | ICD-10-CM | POA: Diagnosis not present

## 2022-01-25 DIAGNOSIS — I10 Essential (primary) hypertension: Secondary | ICD-10-CM | POA: Diagnosis not present

## 2022-01-25 LAB — CBC
HCT: 24.5 % — ABNORMAL LOW (ref 36.0–46.0)
Hemoglobin: 7.4 g/dL — ABNORMAL LOW (ref 12.0–15.0)
MCH: 25.9 pg — ABNORMAL LOW (ref 26.0–34.0)
MCHC: 30.2 g/dL (ref 30.0–36.0)
MCV: 85.7 fL (ref 80.0–100.0)
Platelets: 154 10*3/uL (ref 150–400)
RBC: 2.86 MIL/uL — ABNORMAL LOW (ref 3.87–5.11)
RDW: 18 % — ABNORMAL HIGH (ref 11.5–15.5)
WBC: 6.5 10*3/uL (ref 4.0–10.5)
nRBC: 0 % (ref 0.0–0.2)

## 2022-01-25 LAB — PREPARE RBC (CROSSMATCH)

## 2022-01-25 LAB — BASIC METABOLIC PANEL
Anion gap: 7 (ref 5–15)
BUN: 17 mg/dL (ref 8–23)
CO2: 21 mmol/L — ABNORMAL LOW (ref 22–32)
Calcium: 7.9 mg/dL — ABNORMAL LOW (ref 8.9–10.3)
Chloride: 108 mmol/L (ref 98–111)
Creatinine, Ser: 1.45 mg/dL — ABNORMAL HIGH (ref 0.44–1.00)
GFR, Estimated: 39 mL/min — ABNORMAL LOW (ref >60)
Glucose, Bld: 101 mg/dL — ABNORMAL HIGH (ref 70–99)
Potassium: 4.1 mmol/L (ref 3.5–5.1)
Sodium: 136 mmol/L (ref 135–145)

## 2022-01-25 LAB — HEMOGLOBIN AND HEMATOCRIT, BLOOD
HCT: 31.2 % — ABNORMAL LOW (ref 36.0–46.0)
Hemoglobin: 9.7 g/dL — ABNORMAL LOW (ref 12.0–15.0)

## 2022-01-25 MED ORDER — DIPHENHYDRAMINE HCL 25 MG PO CAPS
25.0000 mg | ORAL_CAPSULE | Freq: Once | ORAL | Status: AC
  Administered 2022-01-25: 25 mg via ORAL
  Filled 2022-01-25: qty 1

## 2022-01-25 MED ORDER — MIDODRINE HCL 5 MG PO TABS
5.0000 mg | ORAL_TABLET | Freq: Two times a day (BID) | ORAL | Status: DC
  Administered 2022-01-25 – 2022-01-26 (×2): 5 mg via ORAL
  Filled 2022-01-25 (×2): qty 1

## 2022-01-25 MED ORDER — MELATONIN 3 MG PO TABS
3.0000 mg | ORAL_TABLET | Freq: Once | ORAL | Status: AC
  Administered 2022-01-25: 3 mg via ORAL
  Filled 2022-01-25 (×2): qty 1

## 2022-01-25 MED ORDER — SODIUM CHLORIDE 0.9% IV SOLUTION: Freq: Once | INTRAVENOUS | Status: AC

## 2022-01-25 NOTE — Progress Notes (Signed)
Mobility Specialist Progress Note  ? ? 01/25/22 1557  ?Mobility  ?Activity Ambulated with assistance in hallway  ?Level of Assistance Standby assist, set-up cues, supervision of patient - no hands on  ?Assistive Device Four wheel walker  ?Distance Ambulated (ft) 150 ft  ?Activity Response Tolerated well  ?$Mobility charge 1 Mobility  ? ?Pt received in bed and agreeable. No complaints on walk. Returned to bed with call bell in reach.   ? ?Hildred Alamin ?Mobility Specialist  ?  ?

## 2022-01-25 NOTE — Assessment & Plan Note (Addendum)
Seen by GI and underwent EGD ?Showed portal hypertensive gastropathy without signs of active bleeding ?She reports dark stools but BUN has been stable (?iron supplements) ?Hgb has trended down, but suspect this was hemodilution with IV fluids ?Continue PPI BID ? ?

## 2022-01-25 NOTE — Progress Notes (Signed)
?Progress Note ? ? ?Patient: Diane Pruitt ZCH:[id number] DOB: 1951-02-25 DOA: 01/21/2022     0 ?DOS: the patient was seen and examined on 01/25/2022 ?  ?Brief hospital course: ?Patient is a 71 years old female with history of diabetes, hypertension, anemia, previous stroke, CKD stage IIIa, paroxysmal atrial fibrillation on Eliquis, hypothyroidism, Barrett's esophagus, history of cirrhosis of liver likely secondary to alcohol usage with portal hypertensive gastropathy presented to hospital with weakness and lightheadedness.  Patient has had EGD on 01/05/2022 and was recently admitted between 3/9-3/11 in the hospital for acute blood loss anemia and diagnosis of new liver cirrhosis.  Patient was initiated on Lasix and spironolactone at that time and was resumed on her home regimen of Eliquis.  On this presentation, patient had been complaining of 1 week of progressive weakness and lightheadedness.  Initially in the ED patient was clinically volume depleted.  Creatinine was elevated at 1.6 from baseline 1.1.  Hemoglobin was low at 7.7.  Patient was then admitted hospital for acute kidney injury, volume depletion and worsening anemia.  ? ?Assessment and Plan: ?* Acute renal failure superimposed on stage 3a chronic kidney disease ([place]) ?Patient presenting with several days of progressively worsening generalized weakness and lightheadedness shortly after being initiated on Lasix and spironolactone ?Clinically, patient appears volume depleted and is relatively hypotensive with notably elevated creatinine of 1.7, up from baseline of approximately 1.0 suggestive of acute kidney injury ?Temporarily holding diuretics ?Gentle intravenous hydration with isotonic fluids ?Strict input and output monitoring ?Avoiding nephrotoxic agents if at all possible ?Serial chemistries being performed to monitor renal function and electrolytes ?Serum creatinine down to 1.45 today ? ?Iron deficiency anemia due to chronic blood loss ?Patient  initially presented with hemoglobin of 7.7, down from 8.0 recently ?While this is does not appear abnormal at first glance, patient is volume depleted with concerns that hemoglobin may actually be much lower than this once patient receives fluids ?EGD performed in early March revealed portal hypertensive gastropathy without active bleeding ?Patient is additionally on chronic Eliquis therapy due to known history of atrial fibrillation ?Seen by GI with recommendations to hold anticoagulation for another 10 days ?Monitoring hemoglobin and hematocrit with serial CBCs ?Type and screen obtained ?Intravenous proton pump inhibitor  ?Hemoglobin down to 7.4 with IV fluids. BP running low in 90s and she is feeling fatigued ?Will transfuse 1 unit prbc today ? ? ?Cirrhosis of liver ([place]) ?Recent diagnosis during previous hospitalization in early March ?According to review of notes, cirrhosis is felt to presumably be secondary to alcohol use ?Temporarily holding recently initiated regimen of diuretics including Lasix and spironolactone.  Once patient is euvolemic, patient can likely be placed on a more mild regimen of these medications ?Of note, cirrhosis is complicated by presence of portal hypertensive gastropathy as noted the EGD earlier in the month by Dr. Therisa Doyne. ? ?Hypokalemia ?Resolved ? ? ?Essential hypertension ?Holding diuretics as noted above ?Continuing home regimen of diltiazem mainly for rate control as blood pressure tolerates (will hold for SBP < 100) ?As needed intravenous antihypertensives for markedly elevated blood pressures ? ? ? ? ?Paroxysmal atrial fibrillation (HCC) ?Currently rate controlled even though she has not taken Diltiazem for several days.   ?Continuing home amiodarone.   ?Temporarily holding home regimen of Eliquis as noted above as we evaluate for any evidence of bleeding.  Per GI, will plan on restarting anticoagulation in the next 10 days ?Monitoring on telemetry ? ? ?Acquired  hypothyroidism ?Resume home regimen of Synthroid ? ? ? ?  Hyperlipidemia ?Continuing home regimen of lipid lowering therapy. ? ? ?Portal hypertensive gastropathy (HCC) ?Seen by GI and underwent EGD ?Showed portal hypertensive gastropathy without signs of active bleeding ?She reports dark stools but BUN has been stable (?iron supplements) ?Hgb has trended down, but suspect this was hemodilution with IV fluids ?Continue PPI BID ?If labs remain stable, anticipate discharge tomorrow with outpatient GI follow up ? ? ? ? ?  ? ?Subjective: she feels fatigued, reports some dark stools, no vomiting. ? ?Physical Exam: ?Vitals:  ? 01/24/22 2029 01/25/22 0500 01/25/22 0800 01/25/22 1100  ?BP: (!) 99/56 (!) 106/57 (!) 90/55 (!) 105/59  ?Pulse: 66 62 71 64  ?Resp: '20 18 16 '$ (!) 21  ?Temp: 98.6 ?F (37 ?C) 97.8 ?F (36.6 ?C) 97.7 ?F (36.5 ?C) 97.7 ?F (36.5 ?C)  ?TempSrc: Oral Oral Oral Oral  ?SpO2: 99% 100% 100%   ?Weight:      ?Height:      ? ?General exam: Alert, awake, oriented x 3 ?Respiratory system: Clear to auscultation. Respiratory effort normal. ?Cardiovascular system:RRR. No murmurs, rubs, gallops. ?Gastrointestinal system: Abdomen is nondistended, soft and nontender. No organomegaly or masses felt. Normal bowel sounds heard. ?Central nervous system: Alert and oriented. No focal neurological deficits. ?Extremities: No C/C/E, +pedal pulses ?Skin: No rashes, lesions or ulcers ?Psychiatry: Judgement and insight appear normal. Mood & affect appropriate.  ? ?Data Reviewed: ? ?Reviewed chemistry and cbc from today. Hgb down to 7.4. BUN has also trended down arguing against persistent upper GI bleeding ? ?Family Communication: discussed with patient ? ?Disposition: ?Status is: Inpatient ?Remains inpatient appropriate because: transfusion of prbc for symptomatic anemia ? Planned Discharge Destination: Home with Home Health ? ? ? ?Time spent: 32mnutes ? ?Author: ?JKathie Dike MD ?01/25/2022 11:29 AM ? ?For on call review  [email or web address]  ?

## 2022-01-25 NOTE — Hospital Course (Signed)
Patient is a 71 years old female with history of diabetes, hypertension, anemia, previous stroke, CKD stage IIIa, paroxysmal atrial fibrillation on Eliquis, hypothyroidism, Barrett's esophagus, history of cirrhosis of liver likely secondary to alcohol usage with portal hypertensive gastropathy presented to hospital with weakness and lightheadedness.  Patient has had EGD on 01/05/2022 and was recently admitted between 3/9-3/11 in the hospital for acute blood loss anemia and diagnosis of new liver cirrhosis.  Patient was initiated on Lasix and spironolactone at that time and was resumed on her home regimen of Eliquis.  On this presentation, patient had been complaining of 1 week of progressive weakness and lightheadedness.  Initially in the ED patient was clinically volume depleted.  Creatinine was elevated at 1.6 from baseline 1.1.  Hemoglobin was low at 7.7.  Patient was then admitted hospital for acute kidney injury, volume depletion and worsening anemia.  ?

## 2022-01-26 DIAGNOSIS — D649 Anemia, unspecified: Secondary | ICD-10-CM | POA: Diagnosis not present

## 2022-01-26 DIAGNOSIS — K7460 Unspecified cirrhosis of liver: Secondary | ICD-10-CM | POA: Diagnosis not present

## 2022-01-26 DIAGNOSIS — N1831 Chronic kidney disease, stage 3a: Secondary | ICD-10-CM | POA: Diagnosis not present

## 2022-01-26 DIAGNOSIS — R188 Other ascites: Secondary | ICD-10-CM | POA: Diagnosis not present

## 2022-01-26 DIAGNOSIS — E039 Hypothyroidism, unspecified: Secondary | ICD-10-CM | POA: Diagnosis not present

## 2022-01-26 DIAGNOSIS — I10 Essential (primary) hypertension: Secondary | ICD-10-CM | POA: Diagnosis not present

## 2022-01-26 DIAGNOSIS — N179 Acute kidney failure, unspecified: Secondary | ICD-10-CM | POA: Diagnosis not present

## 2022-01-26 LAB — BPAM RBC
Blood Product Expiration Date: 202304052359
Blood Product Expiration Date: 202304252359
ISSUE DATE / TIME: 202303270326
ISSUE DATE / TIME: 202303301222
Unit Type and Rh: 6200
Unit Type and Rh: 6200

## 2022-01-26 LAB — TYPE AND SCREEN
ABO/RH(D): A POS
Antibody Screen: NEGATIVE
Unit division: 0
Unit division: 0

## 2022-01-26 LAB — CBC
HCT: 27.8 % — ABNORMAL LOW (ref 36.0–46.0)
Hemoglobin: 8.7 g/dL — ABNORMAL LOW (ref 12.0–15.0)
MCH: 26.8 pg (ref 26.0–34.0)
MCHC: 31.3 g/dL (ref 30.0–36.0)
MCV: 85.5 fL (ref 80.0–100.0)
Platelets: 143 10*3/uL — ABNORMAL LOW (ref 150–400)
RBC: 3.25 MIL/uL — ABNORMAL LOW (ref 3.87–5.11)
RDW: 17.7 % — ABNORMAL HIGH (ref 11.5–15.5)
WBC: 5.7 10*3/uL (ref 4.0–10.5)
nRBC: 0 % (ref 0.0–0.2)

## 2022-01-26 MED ORDER — POLYETHYLENE GLYCOL 3350 17 G PO PACK: 17.0000 g | PACK | Freq: Every day | ORAL | 0 refills | Status: AC | PRN

## 2022-01-26 MED ORDER — ELIQUIS 5 MG PO TABS: 5.0000 mg | ORAL_TABLET | Freq: Two times a day (BID) | ORAL | Status: DC

## 2022-01-26 MED ORDER — SPIRONOLACTONE 50 MG PO TABS: 50.0000 mg | ORAL_TABLET | Freq: Every day | ORAL | 0 refills | Status: DC | PRN

## 2022-01-26 MED ORDER — POLYSACCHARIDE IRON COMPLEX 150 MG PO CAPS
150.0000 mg | ORAL_CAPSULE | Freq: Every day | ORAL | 1 refills | Status: AC
Start: 2022-01-27 — End: ?

## 2022-01-26 MED ORDER — PANTOPRAZOLE SODIUM 40 MG PO TBEC: 40.0000 mg | DELAYED_RELEASE_TABLET | Freq: Two times a day (BID) | ORAL | 0 refills | Status: AC

## 2022-01-26 MED ORDER — FUROSEMIDE 20 MG PO TABS: 20.0000 mg | ORAL_TABLET | Freq: Every day | ORAL | 0 refills | Status: DC | PRN

## 2022-01-26 MED ORDER — MIDODRINE HCL 5 MG PO TABS: 5.0000 mg | ORAL_TABLET | Freq: Three times a day (TID) | ORAL | 0 refills | Status: DC

## 2022-01-26 NOTE — Progress Notes (Signed)
Occupational Therapy Treatment ?Patient Details ?Name: Diane Pruitt ?MRN: [id number] ?DOB: 17-Jun-1951 ?Today's Date: 01/26/2022 ? ? ?History of present illness 71 yo female adm 3/26 with lightheadedness with AKI and anemia. PMhx: Afib, CVA with L sided weakness, anemia, gout, DM, CKD, dizziness, liver cirrhosis, MDD, HTN ?  ?OT comments ? Patient was able to perform mobility, transfers, self care, and dressing with supervision. Patient is expected to return home today with HHOT to increase safety with self care tasks.   ? ?Recommendations for follow up therapy are one component of a multi-disciplinary discharge planning process, led by the attending physician.  Recommendations may be updated based on patient status, additional functional criteria and insurance authorization. ?   ?Follow Up Recommendations ? Home health OT  ?  ?Assistance Recommended at Discharge Intermittent Supervision/Assistance  ?Patient can return home with the following ? A little help with walking and/or transfers;A little help with bathing/dressing/bathroom;Assistance with cooking/housework;Assist for transportation ?  ?Equipment Recommendations ? Tub/shower seat  ?  ?Recommendations for Other Services   ? ?  ?Precautions / Restrictions Precautions ?Precautions: Fall ?Restrictions ?Weight Bearing Restrictions: No  ? ? ?  ? ?Mobility Bed Mobility ?Overal bed mobility: Independent ?  ?  ?  ?  ?  ?  ?General bed mobility comments: no assistance with bed mobility ?  ? ?Transfers ?Overall transfer level: Needs assistance ?Equipment used: Rollator (4 wheels) ?Transfers: Sit to/from Stand ?Sit to Stand: Supervision ?  ?  ?  ?  ?  ?General transfer comment: supervision for safety ?  ?  ?Balance Overall balance assessment: Needs assistance ?  ?Sitting balance-Leahy Scale: Normal ?Sitting balance - Comments: static sitting without UE support ?  ?Standing balance support: No upper extremity supported ?Standing balance-Leahy Scale: Fair ?Standing  balance comment: able to perform standing tasks without UE support ?  ?  ?  ?  ?  ?  ?  ?  ?  ?  ?  ?   ? ?ADL either performed or assessed with clinical judgement  ? ?ADL Overall ADL's : Needs assistance/impaired ?  ?  ?Grooming: Wash/dry hands;Wash/dry face;Oral care;Brushing hair;Supervision/safety;Standing ?Grooming Details (indicate cue type and reason): completed standing at sink ?  ?  ?  ?  ?Upper Body Dressing : Set up;Sitting ?Upper Body Dressing Details (indicate cue type and reason): donned T-shirt ?Lower Body Dressing: Supervision/safety;Sit to/from stand ?Lower Body Dressing Details (indicate cue type and reason): donned pants ?Toilet Transfer: Supervision/safety;Ambulation;Regular Toilet ?  ?Toileting- Clothing Manipulation and Hygiene: Modified independent ?  ?  ?  ?  ?General ADL Comments: demonstrated good safety with self care and no physical assistance ?  ? ?Extremity/Trunk Assessment   ?  ?  ?  ?  ?  ? ?Vision   ?  ?  ?Perception   ?  ?Praxis   ?  ? ?Cognition Arousal/Alertness: Awake/alert ?Behavior During Therapy: Flat affect ?Overall Cognitive Status: No family/caregiver present to determine baseline cognitive functioning ?Area of Impairment: Safety/judgement ?  ?  ?  ?  ?  ?  ?  ?  ?  ?  ?  ?  ?Safety/Judgement: Decreased awareness of deficits ?  ?  ?General Comments: demonstrated good safety with rollator use ?  ?  ?   ?Exercises   ? ?  ?Shoulder Instructions   ? ? ?  ?General Comments    ? ? ?Pertinent Vitals/ Pain       Pain Assessment ?Pain Assessment: No/denies pain ? ?Home Living   ?  ?  ?  ?  ?  ?  ?  ?  ?  ?  ?  ?  ?  ?  ?  ?  ?  ?  ? ?  ?  Prior Functioning/Environment    ?  ?  ?  ?   ? ?Frequency ? Min 2X/week  ? ? ? ? ?  ?Progress Toward Goals ? ?OT Goals(current goals can now be found in the care plan section) ? Progress towards OT goals: Progressing toward goals ? ?Acute Rehab OT Goals ?Patient Stated Goal: go home ?OT Goal Formulation: With patient ?Time For Goal Achievement:  02/06/22 ?Potential to Achieve Goals: Good ?ADL Goals ?Pt Will Perform Upper Body Dressing: sitting;standing;with modified independence ?Pt Will Perform Lower Body Dressing: with supervision;sitting/lateral leans;sit to/from stand ?Pt Will Transfer to Toilet: with modified independence;ambulating;regular height toilet ?Pt Will Perform Tub/Shower Transfer: with supervision;shower seat;ambulating;Shower transfer  ?Plan Discharge plan needs to be updated   ? ?Co-evaluation ? ? ?   ?  ?  ?  ?  ? ?  ?AM-PAC OT "6 Clicks" Daily Activity     ?Outcome Measure ? ? Help from another person eating meals?: None ?Help from another person taking care of personal grooming?: A Little ?Help from another person toileting, which includes using toliet, bedpan, or urinal?: A Little ?Help from another person bathing (including washing, rinsing, drying)?: A Little ?Help from another person to put on and taking off regular upper body clothing?: A Little ?Help from another person to put on and taking off regular lower body clothing?: A Little ?6 Click Score: 19 ? ?  ?End of Session Equipment Utilized During Treatment: Rollator (4 wheels) ? ?OT Visit Diagnosis: Unsteadiness on feet (R26.81);Other abnormalities of gait and mobility (R26.89);Muscle weakness (generalized) (M62.81) ?  ?Activity Tolerance Patient tolerated treatment well ?  ?Patient Left in bed;with call bell/phone within reach ?  ?Nurse Communication Mobility status ?  ? ?   ? ?Time: 1029-1050 ?OT Time Calculation (min): 21 min ? ?Charges: OT General Charges ?$OT Visit: 1 Visit ?OT Treatments ?$Self Care/Home Management : 8-22 mins ? ?Lodema Hong, OTA ?Acute Rehabilitation Services  ?Pager [phone] ?Office [phone] ? ? ?[place] ?01/26/2022, 11:18 AM ?

## 2022-01-26 NOTE — Discharge Summary (Signed)
?Physician Discharge Summary ?  ?Patient: Diane Pruitt MRN: [id number] DOB: 1950-12-04  ?Admit date:     01/21/2022  ?Discharge date: 01/26/22  ?Discharge Physician: Kathie Dike  ? ?PCP: [name], MD  ? ?Recommendations at discharge:  ? ?Repeat blood work including CBC is scheduled on 4/6 with her gastroenterologist ?[facility] office will call her with follow-up appointment ? ?Discharge Diagnoses: ?Principal Problem: ?  Acute renal failure superimposed on stage 3a chronic kidney disease ([place]) ?Active Problems: ?  Iron deficiency anemia due to chronic blood loss ?  Cirrhosis of liver ([place]) ?  Essential hypertension ?  Hypokalemia ?  Paroxysmal atrial fibrillation (H[facility]) ?  Acquired hypothyroidism ?  Hyperlipidemia ?  Anemia ?  Portal hypertensive gastropathy ([facility]) ? ?Resolved Problems: ?  * No resolved hospital problems. * ? 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  Patient has had EGD on 01/05/2022 and was recently admitted between 3/9-3/11 in the hospital for acute blood loss anemia and diagnosis of new liver cirrhosis.  Patient was initiated on Lasix and spironolactone at that time and was resumed on her home regimen of Eliquis.  On this presentation, patient had been complaining of 1 week of progressive weakness and lightheadedness.  Initially in the ED patient was clinically volume depleted.  Creatinine was elevated at 1.6 from baseline 1.1.  Hemoglobin was low at 7.7.  Patient was then admitted hospital for acute kidney injury, volume depletion and worsening anemia.  ? ?Assessment and Plan: ?* Acute renal failure superimposed on stage 3a chronic kidney disease ([place]) ?Patient  presenting with several days of progressively worsening generalized weakness and lightheadedness shortly after being initiated on Lasix and spironolactone ?Clinically, patient appears volume depleted and is relatively hypotensive with notably elevated creatinine of 1.7, up from baseline of approximately 1.0 suggestive of acute kidney injury ?Diuretics have been held ?Gentle intravenous hydration with isotonic fluids ?Strict input and output monitoring ?Avoiding nephrotoxic agents if at all possible ?Serial chemistries being performed to monitor renal function and electrolytes ?Serum creatinine down to 1.45 ?This can be further followed up as an outpatient ? ?Iron deficiency anemia due to chronic blood loss ?Patient initially presented with hemoglobin of 7.7, down from 8.0 recently ?While this is does not appear abnormal at first glance, patient is volume depleted with concerns that hemoglobin may actually be much lower than this once patient receives fluids ?EGD performed in early March revealed portal hypertensive gastropathy without active bleeding ?Patient is additionally on chronic Eliquis therapy due to known history of atrial fibrillation ?Seen by GI with recommendations to hold anticoagulation for another 10 days ?She was transfused a total of 2 units of PRBC during her hospital stay ?Discharge hemoglobin stable at 8.7 ?Continue PPI twice daily ?Repeat hemoglobin check planned for 4/6 ? ? ?Cirrhosis of liver ([place]) ?Recent diagnosis during previous hospitalization in early March ?According to review of notes, cirrhosis is felt to presumably be secondary to alcohol use ?Patient has been recently discharged on low-dose Lasix and Aldactone, but unfortunately became dehydrated ?We will hold off on resuming daily diuretics for now.  I have advised her to restart diuretics if she starts noticing any developing edema ?Of note, cirrhosis is complicated by presence of portal hypertensive gastropathy as noted the  EGD  earlier in the month by Dr. Therisa Doyne. ? ?Hypokalemia ?Resolved ? ? ?Essential hypertension ?Holding diuretics as noted above ?Home dose of diltiazem was discontinued ?Blood pressures were noted to be running in the 90s with patient being asymptomatic ?Started on a course of midodrine ? ? ? ? ?Paroxysmal atrial fibrillation (HCC) ?Currently rate controlled   ?Continuing home amiodarone.   ?Temporarily holding home regimen of Eliquis due to concerns for bleeding.  Per GI, will plan on restarting anticoagulation in the next 10 days (to be resumed 4/8) ? ?Acquired hypothyroidism ?Resume home regimen of Synthroid ? ? ? ?Hyperlipidemia ?Continuing home regimen of lipid lowering therapy. ? ? ?Portal hypertensive gastropathy (HCC) ?Seen by GI and underwent EGD ?Showed portal hypertensive gastropathy without signs of active bleeding ?She reports dark stools but BUN has been stable (?iron supplements) ?Hgb has trended down, but suspect this was hemodilution with IV fluids ?Continue PPI BID ? ? ? ? ? ?  ? ? ?Consultants: Gastroenterology ?Procedures performed:   ?Disposition: Home health ?Diet recommendation:  ?Discharge Diet Orders (From admission, onward)  ? ?  Start     Ordered  ? 01/26/22 0000  Diet - low sodium heart healthy       ? 01/26/22 0956  ? ?  ?  ? ?  ? ?Cardiac diet ?DISCHARGE MEDICATION: ?Allergies as of 01/26/2022   ? ?   Reactions  ? Colchicine Other (See Comments)  ? Stomach upset  ? ?  ? ?  ?Medication List  ?  ? ?STOP taking these medications   ? ?diltiazem 120 MG 24 hr capsule ?Commonly known as: CARDIZEM CD ?  ?ferrous sulfate 325 (65 FE) MG tablet ?  ?metFORMIN 500 MG tablet ?Commonly known as: GLUCOPHAGE ?  ? ?  ? ?TAKE these medications   ? ?alendronate 70 MG tablet ?Commonly known as: FOSAMAX ?Take 70 mg by mouth once a week. ?  ?allopurinol 100 MG tablet ?Commonly known as: ZYLOPRIM ?Take 200 mg by mouth daily. ?  ?amiodarone 200 MG tablet ?Commonly known as: PACERONE ?Take 200 mg by mouth in the  morning and at bedtime. ?  ?Eliquis 5 MG Tabs tablet ?Generic drug: apixaban ?Take 1 tablet (5 mg total) by mouth 2 (two) times daily. Resume from 02/03/2022 ?What changed: additional instructions ?  ?ezetimibe 10 MG tablet ?Commonly known as: ZETIA ?Take 10 mg by mouth at bedtime. ?  ?furosemide 20 MG tablet ?Commonly known as: LASIX ?Take 1 tablet (20 mg total) by mouth daily as needed for fluid. ?What changed:  ?when to take this ?reasons to take this ?  ?iron polysaccharides 150 MG capsule ?Commonly known as: NIFEREX ?Take 1 capsule (150 mg total) by mouth daily. ?Start taking on: January 27, 2022 ?  ?levothyroxine 200 MCG tablet ?Commonly known as: SYNTHROID ?Take 200 mcg by mouth every morning. ?  ?midodrine 5 MG tablet ?Commonly known as: PROAMATINE ?Take 1 tablet (5 mg total) by mouth 3 (three) times daily with meals. ?  ?multivitamin tablet ?Take 1 tablet by mouth daily. ?  ?pantoprazole 40 MG tablet ?Commonly known as: Protonix ?Take 1 tablet (40 mg total) by mouth 2 (two) times daily before a meal. ?What changed: when to take this ?  ?polyethylene glycol 17 g packet ?Commonly known as: MIRALAX / GLYCOLAX ?Take 17 g by mouth daily as needed for mild constipation. ?  ?PRESERVISION AREDS 2 PO ?Take 1 capsule by mouth daily. ?  ?rosuvastatin 40 MG tablet ?Commonly known as:  CRESTOR ?Take 40 mg by mouth daily. ?  ?sertraline 50 MG tablet ?Commonly known as: ZOLOFT ?Take 50 mg by mouth daily. ?  ?spironolactone 50 MG tablet ?Commonly known as: ALDACTONE ?Take 1 tablet (50 mg total) by mouth daily as needed (swelling). ?What changed:  ?when to take this ?reasons to take this ?  ?Vitamin D 50 MCG (2000 UT) tablet ?Take 2,000 Units by mouth daily. ?  ? ?  ? ? Follow-up Information   ? ? [facility] Follow up.   ?Specialty: Home Health Services ?Why: Physical Therapy-Occupational Therapy-(OT) to assist with bathing. Office to call with visit times. ?Contact information: ?[address] ?[address] ?[place] [address] ?[phone] ? ? ?  ?  ? ? [facility] Follow up.   ?Why: Rolling walker with seat. To be delivered to the room prior to d/c. ?Contact information: ?[address] ?[place]

## 2022-01-26 NOTE — Progress Notes (Signed)
Patient alert and oriented verbalized understanding of dc instructions. All belongings given to patient. Family will be here around 1pm . Patients new location >dc lounge. Geryl Councilman notified. ?

## 2022-01-29 ENCOUNTER — Emergency Department (HOSPITAL_COMMUNITY): Payer: Medicare HMO

## 2022-01-29 ENCOUNTER — Inpatient Hospital Stay (HOSPITAL_COMMUNITY)
Admission: EM | Admit: 2022-01-29 | Discharge: 2022-02-01 | DRG: 312 | Disposition: A | Discharge status: Skilled Nursing Facility | Payer: Medicare HMO | Attending: Internal Medicine | Admitting: Internal Medicine

## 2022-01-29 ENCOUNTER — Encounter (HOSPITAL_COMMUNITY): Payer: Self-pay | Admitting: Emergency Medicine

## 2022-01-29 DIAGNOSIS — E039 Hypothyroidism, unspecified: Secondary | ICD-10-CM | POA: Diagnosis present

## 2022-01-29 DIAGNOSIS — I959 Hypotension, unspecified: Secondary | ICD-10-CM | POA: Diagnosis present

## 2022-01-29 DIAGNOSIS — R69 Illness, unspecified: Secondary | ICD-10-CM | POA: Diagnosis not present

## 2022-01-29 DIAGNOSIS — Z83438 Family history of other disorder of lipoprotein metabolism and other lipidemia: Secondary | ICD-10-CM

## 2022-01-29 DIAGNOSIS — D500 Iron deficiency anemia secondary to blood loss (chronic): Secondary | ICD-10-CM | POA: Diagnosis present

## 2022-01-29 DIAGNOSIS — R42 Dizziness and giddiness: Secondary | ICD-10-CM

## 2022-01-29 DIAGNOSIS — E871 Hypo-osmolality and hyponatremia: Secondary | ICD-10-CM

## 2022-01-29 DIAGNOSIS — D649 Anemia, unspecified: Secondary | ICD-10-CM | POA: Diagnosis present

## 2022-01-29 DIAGNOSIS — Z8673 Personal history of transient ischemic attack (TIA), and cerebral infarction without residual deficits: Secondary | ICD-10-CM

## 2022-01-29 DIAGNOSIS — Z79899 Other long term (current) drug therapy: Secondary | ICD-10-CM

## 2022-01-29 DIAGNOSIS — R531 Weakness: Principal | ICD-10-CM

## 2022-01-29 DIAGNOSIS — I951 Orthostatic hypotension: Principal | ICD-10-CM | POA: Diagnosis present

## 2022-01-29 DIAGNOSIS — Z7901 Long term (current) use of anticoagulants: Secondary | ICD-10-CM

## 2022-01-29 DIAGNOSIS — E1122 Type 2 diabetes mellitus with diabetic chronic kidney disease: Secondary | ICD-10-CM | POA: Diagnosis present

## 2022-01-29 DIAGNOSIS — K7031 Alcoholic cirrhosis of liver with ascites: Secondary | ICD-10-CM | POA: Diagnosis present

## 2022-01-29 DIAGNOSIS — Z7983 Long term (current) use of bisphosphonates: Secondary | ICD-10-CM

## 2022-01-29 DIAGNOSIS — I480 Paroxysmal atrial fibrillation: Secondary | ICD-10-CM | POA: Diagnosis present

## 2022-01-29 DIAGNOSIS — N179 Acute kidney failure, unspecified: Secondary | ICD-10-CM | POA: Diagnosis present

## 2022-01-29 DIAGNOSIS — E785 Hyperlipidemia, unspecified: Secondary | ICD-10-CM | POA: Diagnosis present

## 2022-01-29 DIAGNOSIS — D509 Iron deficiency anemia, unspecified: Secondary | ICD-10-CM | POA: Diagnosis not present

## 2022-01-29 DIAGNOSIS — Z043 Encounter for examination and observation following other accident: Secondary | ICD-10-CM | POA: Diagnosis not present

## 2022-01-29 DIAGNOSIS — I1310 Hypertensive heart and chronic kidney disease without heart failure, with stage 1 through stage 4 chronic kidney disease, or unspecified chronic kidney disease: Secondary | ICD-10-CM | POA: Diagnosis present

## 2022-01-29 DIAGNOSIS — N1831 Chronic kidney disease, stage 3a: Secondary | ICD-10-CM | POA: Diagnosis present

## 2022-01-29 DIAGNOSIS — R296 Repeated falls: Secondary | ICD-10-CM | POA: Diagnosis present

## 2022-01-29 DIAGNOSIS — E869 Volume depletion, unspecified: Secondary | ICD-10-CM | POA: Diagnosis present

## 2022-01-29 DIAGNOSIS — K766 Portal hypertension: Secondary | ICD-10-CM | POA: Diagnosis not present

## 2022-01-29 DIAGNOSIS — K767 Hepatorenal syndrome: Secondary | ICD-10-CM | POA: Diagnosis present

## 2022-01-29 DIAGNOSIS — R7989 Other specified abnormal findings of blood chemistry: Secondary | ICD-10-CM | POA: Diagnosis present

## 2022-01-29 DIAGNOSIS — Z888 Allergy status to other drugs, medicaments and biological substances status: Secondary | ICD-10-CM

## 2022-01-29 DIAGNOSIS — R188 Other ascites: Secondary | ICD-10-CM | POA: Diagnosis not present

## 2022-01-29 DIAGNOSIS — K828 Other specified diseases of gallbladder: Secondary | ICD-10-CM | POA: Diagnosis not present

## 2022-01-29 DIAGNOSIS — F1010 Alcohol abuse, uncomplicated: Secondary | ICD-10-CM | POA: Diagnosis present

## 2022-01-29 DIAGNOSIS — Z8249 Family history of ischemic heart disease and other diseases of the circulatory system: Secondary | ICD-10-CM

## 2022-01-29 DIAGNOSIS — K7030 Alcoholic cirrhosis of liver without ascites: Secondary | ICD-10-CM | POA: Diagnosis present

## 2022-01-29 DIAGNOSIS — Z79890 Hormone replacement therapy (postmenopausal): Secondary | ICD-10-CM

## 2022-01-29 DIAGNOSIS — N289 Disorder of kidney and ureter, unspecified: Secondary | ICD-10-CM | POA: Diagnosis not present

## 2022-01-29 DIAGNOSIS — K7460 Unspecified cirrhosis of liver: Secondary | ICD-10-CM | POA: Diagnosis present

## 2022-01-29 DIAGNOSIS — F32A Depression, unspecified: Secondary | ICD-10-CM | POA: Diagnosis present

## 2022-01-29 LAB — OSMOLALITY: Osmolality: 288 mOsm/kg (ref 275–295)

## 2022-01-29 LAB — CBC WITH DIFFERENTIAL/PLATELET
Abs Immature Granulocytes: 0.05 10*3/uL (ref 0.00–0.07)
Basophils Absolute: 0.1 10*3/uL (ref 0.0–0.1)
Basophils Relative: 1 %
Eosinophils Absolute: 0.1 10*3/uL (ref 0.0–0.5)
Eosinophils Relative: 1 %
HCT: 36.5 % (ref 36.0–46.0)
Hemoglobin: 10.7 g/dL — ABNORMAL LOW (ref 12.0–15.0)
Immature Granulocytes: 1 %
Lymphocytes Relative: 14 %
Lymphs Abs: 1.1 10*3/uL (ref 0.7–4.0)
MCH: 26.2 pg (ref 26.0–34.0)
MCHC: 29.3 g/dL — ABNORMAL LOW (ref 30.0–36.0)
MCV: 89.2 fL (ref 80.0–100.0)
Monocytes Absolute: 0.7 10*3/uL (ref 0.1–1.0)
Monocytes Relative: 8 %
Neutro Abs: 6.5 10*3/uL (ref 1.7–7.7)
Neutrophils Relative %: 75 %
Platelets: 156 10*3/uL (ref 150–400)
RBC: 4.09 MIL/uL (ref 3.87–5.11)
RDW: 17.9 % — ABNORMAL HIGH (ref 11.5–15.5)
WBC: 8.5 10*3/uL (ref 4.0–10.5)
nRBC: 0 % (ref 0.0–0.2)

## 2022-01-29 LAB — COMPREHENSIVE METABOLIC PANEL
ALT: 205 U/L — ABNORMAL HIGH (ref 0–44)
AST: 426 U/L — ABNORMAL HIGH (ref 15–41)
Albumin: 2.4 g/dL — ABNORMAL LOW (ref 3.5–5.0)
Alkaline Phosphatase: 86 U/L (ref 38–126)
Anion gap: 8 (ref 5–15)
BUN: 20 mg/dL (ref 8–23)
CO2: 18 mmol/L — ABNORMAL LOW (ref 22–32)
Calcium: 8 mg/dL — ABNORMAL LOW (ref 8.9–10.3)
Chloride: 104 mmol/L (ref 98–111)
Creatinine, Ser: 1.76 mg/dL — ABNORMAL HIGH (ref 0.44–1.00)
GFR, Estimated: 31 mL/min — ABNORMAL LOW (ref >60)
Glucose, Bld: 101 mg/dL — ABNORMAL HIGH (ref 70–99)
Potassium: 4.2 mmol/L (ref 3.5–5.1)
Sodium: 130 mmol/L — ABNORMAL LOW (ref 135–145)
Total Bilirubin: 0.8 mg/dL (ref 0.3–1.2)
Total Protein: 6.2 g/dL — ABNORMAL LOW (ref 6.5–8.1)

## 2022-01-29 LAB — SODIUM, URINE, RANDOM: Sodium, Ur: <10 mmol/L

## 2022-01-29 LAB — URINALYSIS, ROUTINE W REFLEX MICROSCOPIC
Bilirubin Urine: NEGATIVE
Glucose, UA: NEGATIVE mg/dL
Hgb urine dipstick: NEGATIVE
Ketones, ur: NEGATIVE mg/dL
Leukocytes,Ua: NEGATIVE
Nitrite: NEGATIVE
Protein, ur: NEGATIVE mg/dL
Specific Gravity, Urine: 1.006 (ref 1.005–1.030)
pH: 6 (ref 5.0–8.0)

## 2022-01-29 LAB — TYPE AND SCREEN
ABO/RH(D): A POS
Antibody Screen: NEGATIVE

## 2022-01-29 LAB — PROTIME-INR
INR: 1.2 (ref 0.8–1.2)
Prothrombin Time: 14.9 seconds (ref 11.4–15.2)

## 2022-01-29 LAB — TROPONIN I (HIGH SENSITIVITY)
Troponin I (High Sensitivity): 25 ng/L — ABNORMAL HIGH (ref <18)
Troponin I (High Sensitivity): 25 ng/L — ABNORMAL HIGH (ref <18)

## 2022-01-29 MED ORDER — OCUVITE-LUTEIN PO CAPS
1.0000 | ORAL_CAPSULE | Freq: Every day | ORAL | Status: DC
  Filled 2022-01-29: qty 1

## 2022-01-29 MED ORDER — PRESERVISION AREDS 2 PO CAPS
ORAL_CAPSULE | Freq: Every day | ORAL | Status: DC
Start: 2022-01-29 — End: 2022-01-29

## 2022-01-29 MED ORDER — LEVOTHYROXINE SODIUM 100 MCG PO TABS
200.0000 ug | ORAL_TABLET | Freq: Every morning | ORAL | Status: DC
  Administered 2022-01-29 – 2022-02-01 (×4): 200 ug via ORAL
  Filled 2022-01-29 (×5): qty 2

## 2022-01-29 MED ORDER — VITAMIN D 25 MCG (1000 UNIT) PO TABS
2000.0000 [IU] | ORAL_TABLET | Freq: Every day | ORAL | Status: DC
  Administered 2022-01-29 – 2022-02-01 (×4): 2000 [IU] via ORAL
  Filled 2022-01-29 (×4): qty 2

## 2022-01-29 MED ORDER — ENSURE ENLIVE PO LIQD
237.0000 mL | Freq: Two times a day (BID) | ORAL | Status: DC
  Administered 2022-01-30 – 2022-02-01 (×5): 237 mL via ORAL

## 2022-01-29 MED ORDER — ONDANSETRON 4 MG PO TBDP: 4.0000 mg | ORAL_TABLET | ORAL | Status: DC | PRN

## 2022-01-29 MED ORDER — BACITRACIN ZINC 500 UNIT/GM EX OINT
TOPICAL_OINTMENT | Freq: Two times a day (BID) | CUTANEOUS | Status: DC
  Administered 2022-01-29: 4 via TOPICAL
  Administered 2022-01-29: 1 via TOPICAL
  Administered 2022-01-31 – 2022-02-01 (×4): 31.5 via TOPICAL
  Filled 2022-01-29: qty 0.9
  Filled 2022-01-29: qty 28.4

## 2022-01-29 MED ORDER — PROSIGHT PO TABS
1.0000 | ORAL_TABLET | Freq: Every day | ORAL | Status: DC
  Administered 2022-01-29 – 2022-02-01 (×4): 1 via ORAL
  Filled 2022-01-29 (×4): qty 1

## 2022-01-29 MED ORDER — SERTRALINE HCL 50 MG PO TABS
50.0000 mg | ORAL_TABLET | Freq: Every day | ORAL | Status: DC
  Administered 2022-01-29 – 2022-02-01 (×4): 50 mg via ORAL
  Filled 2022-01-29 (×4): qty 1

## 2022-01-29 MED ORDER — TRAMADOL HCL 50 MG PO TABS
50.0000 mg | ORAL_TABLET | Freq: Three times a day (TID) | ORAL | Status: DC | PRN
  Administered 2022-01-31: 50 mg via ORAL
  Filled 2022-01-29 (×2): qty 1

## 2022-01-29 MED ORDER — SODIUM CHLORIDE 0.9 % IV SOLN: INTRAVENOUS | Status: DC

## 2022-01-29 MED ORDER — ALLOPURINOL 100 MG PO TABS
200.0000 mg | ORAL_TABLET | Freq: Every day | ORAL | Status: DC
  Administered 2022-01-29 – 2022-02-01 (×4): 200 mg via ORAL
  Filled 2022-01-29 (×4): qty 2

## 2022-01-29 MED ORDER — POLYSACCHARIDE IRON COMPLEX 150 MG PO CAPS
150.0000 mg | ORAL_CAPSULE | Freq: Every day | ORAL | Status: DC
  Administered 2022-01-29 – 2022-02-01 (×4): 150 mg via ORAL
  Filled 2022-01-29 (×4): qty 1

## 2022-01-29 MED ORDER — PANTOPRAZOLE SODIUM 40 MG PO TBEC
40.0000 mg | DELAYED_RELEASE_TABLET | Freq: Two times a day (BID) | ORAL | Status: DC
  Administered 2022-01-29 – 2022-02-01 (×6): 40 mg via ORAL
  Filled 2022-01-29 (×7): qty 1

## 2022-01-29 MED ORDER — MIDODRINE HCL 5 MG PO TABS
5.0000 mg | ORAL_TABLET | Freq: Three times a day (TID) | ORAL | Status: DC
  Administered 2022-01-29 – 2022-01-30 (×4): 5 mg via ORAL
  Filled 2022-01-29 (×3): qty 1

## 2022-01-29 MED ORDER — SODIUM CHLORIDE 0.9 % IV BOLUS
500.0000 mL | Freq: Once | INTRAVENOUS | Status: AC
  Administered 2022-01-29: 500 mL via INTRAVENOUS

## 2022-01-29 MED ORDER — LACTATED RINGERS IV BOLUS
1000.0000 mL | Freq: Once | INTRAVENOUS | Status: AC
  Administered 2022-01-29: 1000 mL via INTRAVENOUS

## 2022-01-29 MED ORDER — AMIODARONE HCL 200 MG PO TABS
200.0000 mg | ORAL_TABLET | Freq: Two times a day (BID) | ORAL | Status: DC
  Administered 2022-01-29 – 2022-02-01 (×6): 200 mg via ORAL
  Filled 2022-01-29 (×6): qty 1

## 2022-01-29 NOTE — ED Provider Notes (Signed)
?[facility] ?Provider Note ? ? ?CSN: [id number] ?Arrival date & time: 01/29/22  0231 ? ?  ? ?History ? ?Chief Complaint  ?Patient presents with  ? Fall  ? ? ?Diane Pruitt is a 71 y.o. female who was recently discharged on 3/31 from admission for symptomatic anemia that required blood transfusion x2 units.  She has history of diabetes, hypertension, anemia, previous stroke anticoagulated on Eliquis until 2 days ago, CKD stage III, paroxysmal A-fib, hypothyroidism, Barrett's esophagus, and cirrhosis of liver secondary to alcohol use in the past now with portal hypertension gastropathy who presents with weakness and lightheadedness. ?Newly diagnosed with liver cirrhosis this months.  Presented prior to last admission for concern for progressive weakness and lightheadedness and was volume blood completed in the emergency department with AKI and progressive anemia. ? ?She presents this evening after fall.  States she woke up to go to the bathroom and felt very lightheaded and had a fall between the toilet and the counter.  She did not hit her head she states, has skin tears to both arms.  Denies any pain but states her son wanted her to come be evaluated.  He is staying with her for the time being and had to assist her off the floor this evening after her fall. ? ?I personally reviewed her medical records.  In addition to the above listed problems she has history of embolic stroke, hypertension, hyperlipidemia. ? ? ? ?HPI ? ?  ? ?Home Medications ?Prior to Admission medications   ?Medication Sig Start Date End Date Taking? Authorizing Provider  ?acetaminophen (TYLENOL) 500 MG tablet Take 1,000 mg by mouth every 6 (six) hours as needed for headache or mild pain.   Yes [provider]  ?alendronate (FOSAMAX) 70 MG tablet Take 70 mg by mouth every Monday.   Yes [provider]  ?allopurinol (ZYLOPRIM) 100 MG tablet Take 200 mg by mouth daily. 01/20/20  Yes [provider]  ?amiodarone (PACERONE) 200 MG tablet Take 200 mg by mouth in the morning and at bedtime.  02/27/18  Yes [provider]  ?Cholecalciferol (VITAMIN D) 50 MCG (2000 UT) tablet Take 2,000 Units by mouth daily. 05/03/21  Yes [provider]  ?ezetimibe (ZETIA) 10 MG tablet Take 10 mg by mouth at bedtime. 01/25/20  Yes [provider]  ?iron polysaccharides (NIFEREX) 150 MG capsule Take 1 capsule (150 mg total) by mouth daily. 01/27/22  Yes Kathie Dike, MD  ?levothyroxine (SYNTHROID) 200 MCG tablet Take 200 mcg by mouth every morning. 12/26/21  Yes [provider]  ?midodrine (PROAMATINE) 5 MG tablet Take 1 tablet (5 mg total) by mouth 3 (three) times daily with meals. 01/26/22  Yes Kathie Dike, MD  ?Multiple Vitamin (MULTIVITAMIN) tablet Take 1 tablet by mouth daily.   Yes [provider]  ?Multiple Vitamins-Minerals (PRESERVISION AREDS 2 PO) Take 1 capsule by mouth daily.   Yes [provider]  ?ondansetron (ZOFRAN-ODT) 4 MG disintegrating tablet Take 4 mg by mouth every 4 (four) hours as needed for nausea or vomiting. 01/27/22  Yes [provider]  ?pantoprazole (PROTONIX) 40 MG tablet Take 1 tablet (40 mg total) by mouth 2 (two) times daily before a meal. 01/26/22 02/25/22 Yes Memon, Jolaine Artist, MD  ?polyethylene glycol (MIRALAX / GLYCOLAX) 17 g packet Take 17 g by mouth daily as needed for mild constipation. 01/26/22  Yes Kathie Dike, MD  ?rosuvastatin (CRESTOR) 40 MG tablet Take 40 mg by mouth at bedtime.  11/01/21  Yes [provider]  ?sertraline (ZOLOFT) 50 MG tablet Take 50 mg by mouth daily. 11/01/21  Yes [provider]  ?ELIQUIS 5 MG TABS tablet Take 1 tablet (5 mg total) by mouth 2 (two) times daily. Resume from 02/03/2022 01/26/22   Kathie Dike, MD  ?furosemide (LASIX) 20 MG tablet Take 1 tablet (20 mg total) by mouth daily as needed for fluid. ?Patient not taking: Reported on 01/29/2022 01/26/22   Kathie Dike, MD   ?spironolactone (ALDACTONE) 50 MG tablet Take 1 tablet (50 mg total) by mouth daily as needed (swelling). ?Patient not taking: Reported on 01/29/2022 01/26/22   Kathie Dike, MD  ?   ? ?Allergies    ?Colchicine   ? ?Review of Systems   ?Review of Systems  ?Constitutional:  Positive for fatigue.  ?Neurological:  Positive for weakness and light-headedness.  ? ?Physical Exam ?Updated Vital Signs ?BP (!) 114/59   Pulse 69   Temp 97.7 ?F (36.5 ?C) (Oral)   Resp 18   SpO2 98%  ?Physical Exam ?Vitals and nursing note reviewed.  ?Constitutional:   ?   Appearance: She is not ill-appearing or toxic-appearing.  ?HENT:  ?   Head: Normocephalic and atraumatic.  ?   Nose: Nose normal.  ?   Mouth/Throat:  ?   Mouth: Mucous membranes are moist.  ?   Pharynx: Oropharynx is clear. Uvula midline. No oropharyngeal exudate or posterior oropharyngeal erythema.  ?   Tonsils: No tonsillar exudate.  ?Eyes:  ?   General: Lids are normal. Vision grossly intact.     ?   Right eye: No discharge.     ?   Left eye: No discharge.  ?   Extraocular Movements: Extraocular movements intact.  ?   Conjunctiva/sclera: Conjunctivae normal.  ?   Pupils: Pupils are equal, round, and reactive to light.  ?Neck:  ?   Trachea: Trachea and phonation normal.  ?Cardiovascular:  ?   Rate and Rhythm: Normal rate and regular rhythm.  ?   Pulses: Normal pulses.  ?   Heart sounds: Normal heart sounds. No murmur heard. ?Pulmonary:  ?   Effort: Pulmonary effort is normal. No tachypnea, bradypnea, accessory muscle usage, prolonged expiration or respiratory distress.  ?   Breath sounds: Normal breath sounds. No wheezing or rales.  ?Chest:  ?   Chest wall: No mass, lacerations, deformity, swelling, tenderness, crepitus or edema.  ?Abdominal:  ?   General: Bowel sounds are normal. There is distension.  ?   Palpations: Abdomen is soft. There is fluid wave.  ?   Tenderness: There is no abdominal tenderness. There is no right CVA tenderness, left CVA tenderness, guarding  or rebound.  ?Musculoskeletal:     ?   General: No deformity.  ?   Cervical back: Normal range of motion and neck supple.  ?   Right lower leg: No edema.  ?   Left lower leg: No edema.  ?   Comments: Symmetric strength and sensation upper and lower extremities bilaterally.  ?Lymphadenopathy:  ?   Cervical: No cervical adenopathy.  ?Skin: ?   General: Skin is warm and dry.  ?   Capillary Refill: Capillary refill takes less than 2 seconds.  ?   Comments: Skin tears to left forearm, irrigated and dressed by nurse.  ?Neurological:  ?   General: No focal deficit present.  ?   Mental Status: She is alert and oriented to person, place, and time. Mental status is  at baseline.  ?   GCS: GCS eye subscore is 4. GCS verbal subscore is 5. GCS motor subscore is 6.  ?   Sensory: Sensation is intact.  ?   Motor: Motor function is intact.  ?   Coordination: Coordination is intact.  ?   Comments: Ambulation to be attempted by ED tech with walker one-to-one can be located.  ?Psychiatric:     ?   Mood and Affect: Mood normal.  ? ? ?ED Results / Procedures / Treatments   ?Labs ?(all labs ordered are listed, but only abnormal results are displayed) ?Labs Reviewed  ?COMPREHENSIVE METABOLIC PANEL - Abnormal; Notable for the following components:  ?    Result Value  ? Sodium 130 (*)   ? CO2 18 (*)   ? Glucose, Bld 101 (*)   ? Creatinine, Ser 1.76 (*)   ? Calcium 8.0 (*)   ? Total Protein 6.2 (*)   ? Albumin 2.4 (*)   ? AST 426 (*)   ? ALT 205 (*)   ? GFR, Estimated 31 (*)   ? All other components within normal limits  ?CBC WITH DIFFERENTIAL/PLATELET - Abnormal; Notable for the following components:  ? Hemoglobin 10.7 (*)   ? MCHC 29.3 (*)   ? RDW 17.9 (*)   ? All other components within normal limits  ?TROPONIN I (HIGH SENSITIVITY) - Abnormal; Notable for the following components:  ? Troponin I (High Sensitivity) 25 (*)   ? All other components within normal limits  ?TROPONIN I (HIGH SENSITIVITY) - Abnormal; Notable for the following  components:  ? Troponin I (High Sensitivity) 25 (*)   ? All other components within normal limits  ?URINALYSIS, ROUTINE W REFLEX MICROSCOPIC  ?PROTIME-INR  ?TYPE AND SCREEN  ? ? ?EKG ?EKG Interpretation ? ?Date/Tim

## 2022-01-29 NOTE — ED Notes (Signed)
Pt placed in hospital bed

## 2022-01-29 NOTE — ED Notes (Signed)
Pt ambulated with walker, tolerated well. Pt complained of occasional dizziness, but did not show any imbalance while ambulated. Notified PA. ?

## 2022-01-29 NOTE — Assessment & Plan Note (Addendum)
Significant elevation of LFTs from baseline 1 week ago ?AST 106>426 ?ALT 44>205 ?On 01/03/22 w/u done including anti smooth muscle, AFP, M2Ab which were wnl. A-1 antitrypsin elevated at 216. ceruloplasmin wnl.  ?Likely secondary to possible hypotension/cirrhosis/ascites. 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  ?Needs paracentesis, would like BP better  ?Will have GI see as well  ?

## 2022-01-29 NOTE — Assessment & Plan Note (Addendum)
Rate controlled, continue amiodarone  ?eliquis on hold with recent anemia (10 days post d/c on 3/31 with f/u with GI on 4/6) ?Continue telemetry  ?

## 2022-01-29 NOTE — Discharge Instructions (Addendum)
Please continue home physical therapy and follow-up closely with your gastroenterologist for trending of your liver labs.  Return to the ER with any new severe symptoms. ?

## 2022-01-29 NOTE — Assessment & Plan Note (Signed)
hgb 10.7 today  ? ?

## 2022-01-29 NOTE — [place] FL2 (Signed)
?[place] MEDICAID FL2 LEVEL OF CARE SCREENING TOOL  ?  ? ?IDENTIFICATION  ?Patient Name: ?Diane Pruitt Birthdate: [birth year] Sex: female Admission Date (Current Location): ?01/29/2022  ?[place] and [place] Number: ? [place] ?  Facility and Address:  ?[facility], [place] [address] [place], [place] [address] ?     Provider Number: ?[id number]  ?Attending Physician Name and Address:  ?Veryl Speak, MD ? Relative Name and Phone Number:  ?Tinya Cadogan, [phone] ?   ?Current Level of Care: ?Hospital Recommended Level of Care: ?[place] Prior Approval Number: ?  ? ?Date Approved/Denied: ?  PASRR Number: ?[id number] E ? ?Discharge Plan: ?SNF ?  ? ?Current Diagnoses: ?Patient Active Problem List  ? Diagnosis Date Noted  ? Anemia 01/25/2022  ? Portal hypertensive gastropathy ([place]) 01/25/2022  ? Hyperlipidemia 01/21/2022  ? Hypokalemia 01/06/2022  ? Acute blood loss anemia 01/04/2022  ? Cirrhosis of liver ([place]) 01/04/2022  ? Acute renal failure superimposed on stage 3a chronic kidney disease ([place]) 01/04/2022  ? Elevated LFTs 01/04/2022  ? Gout 01/04/2022  ? Essential hypertension 01/04/2022  ? Depression 01/04/2022  ? GI bleed 01/04/2022  ? Iron deficiency anemia due to chronic blood loss 01/03/2022  ? Unspecified abnormal finding in specimens from other organs, systems and tissues 11/20/2021  ? Age-related osteoporosis without current pathological fracture 11/01/2021  ? Chronic gouty arthritis 11/01/2021  ? Excessive sleepiness 11/01/2021  ? History of embolic stroke 88/82/8003  ? History of tobacco use 11/01/2021  ? Hypercoagulable state ([place]) 11/01/2021  ? Iron deficiency anemia 11/01/2021  ? Obesity due to excess calories 11/01/2021  ? Personal history of colonic polyps 11/01/2021  ? Polyp of colon 11/01/2021  ? Severe major depression, single episode, without psychotic features ([place]) 11/01/2021  ? Acquired hypothyroidism 02/12/2020  ? Compression fracture of L2 ([place])  02/01/2020  ? Low back pain 01/18/2020  ? Pain of right hip joint 01/18/2020  ? Paroxysmal atrial fibrillation ([place]) 01/25/2018  ? Cyst of finger 01/23/2018  ? ? ?Orientation RESPIRATION BLADDER Height & Weight   ?  ?Self, Time, Situation, Place ? Normal Continent Weight:   ?Height:     ?BEHAVIORAL SYMPTOMS/MOOD NEUROLOGICAL BOWEL NUTRITION STATUS  ?    Continent Diet (Regular)  ?AMBULATORY STATUS COMMUNICATION OF NEEDS Skin   ?Extensive Assist Verbally Other (Comment) (Pale,dry,Ecchymosis,arm,leg,bilateral,skin turgor,non-tenting) ?  ?  ?  ?    ?     ?     ? ? ?Personal Care Assistance Level of Assistance  ?Bathing, Dressing, Feeding Bathing Assistance: Limited assistance ?Feeding assistance: Independent ?Dressing Assistance: Limited assistance ?   ? ?Functional Limitations Info  ?  Sight Info:  (WDL) ?Hearing Info: Adequate ?Speech Info: Adequate  ? ? ?SPECIAL CARE FACTORS FREQUENCY  ?PT (By licensed PT)   ?  ?PT Frequency: 2X Min ?  ?  ?  ?  ?   ? ? ?Contractures Contractures Info: Not present  ? ? ?Additional Factors Info  ?Allergies, Code Status, Insulin Sliding Scale Code Status Info: Full ?Allergies Info: Colchicine ?  ?Insulin Sliding Scale Info: insulin aspart (novoLOG) injection 0-15 Units 3 times daily before meals and bedtime ?  ?   ? ?Current Medications (01/29/2022):  This is the current hospital active medication list ?Current Facility-Administered Medications  ?Medication Dose Route Frequency Provider Last Rate Last Admin  ? bacitracin ointment   Topical BID Sponseller, Rebekah R, PA-C   1 application. at 01/29/22 0912  ? ?Current Outpatient Medications  ?Medication Sig  Dispense Refill  ? acetaminophen (TYLENOL) 500 MG tablet Take 1,000 mg by mouth every 6 (six) hours as needed for headache or mild pain.    ? alendronate (FOSAMAX) 70 MG tablet Take 70 mg by mouth every Monday.    ? allopurinol (ZYLOPRIM) 100 MG tablet Take 200 mg by mouth daily.    ? amiodarone (PACERONE) 200 MG tablet Take 200 mg by  mouth in the morning and at bedtime.   3  ? Cholecalciferol (VITAMIN D) 50 MCG (2000 UT) tablet Take 2,000 Units by mouth daily.    ? ezetimibe (ZETIA) 10 MG tablet Take 10 mg by mouth at bedtime.    ? iron polysaccharides (NIFEREX) 150 MG capsule Take 1 capsule (150 mg total) by mouth daily. 30 capsule 1  ? levothyroxine (SYNTHROID) 200 MCG tablet Take 200 mcg by mouth every morning.    ? midodrine (PROAMATINE) 5 MG tablet Take 1 tablet (5 mg total) by mouth 3 (three) times daily with meals. 90 tablet 0  ? Multiple Vitamin (MULTIVITAMIN) tablet Take 1 tablet by mouth daily.    ? Multiple Vitamins-Minerals (PRESERVISION AREDS 2 PO) Take 1 capsule by mouth daily.    ? ondansetron (ZOFRAN-ODT) 4 MG disintegrating tablet Take 4 mg by mouth every 4 (four) hours as needed for nausea or vomiting.    ? pantoprazole (PROTONIX) 40 MG tablet Take 1 tablet (40 mg total) by mouth 2 (two) times daily before a meal. 60 tablet 0  ? polyethylene glycol (MIRALAX / GLYCOLAX) 17 g packet Take 17 g by mouth daily as needed for mild constipation. 14 each 0  ? rosuvastatin (CRESTOR) 40 MG tablet Take 40 mg by mouth at bedtime.    ? sertraline (ZOLOFT) 50 MG tablet Take 50 mg by mouth daily.    ? ELIQUIS 5 MG TABS tablet Take 1 tablet (5 mg total) by mouth 2 (two) times daily. Resume from 02/03/2022 60 tablet   ? furosemide (LASIX) 20 MG tablet Take 1 tablet (20 mg total) by mouth daily as needed for fluid. (Patient not taking: Reported on 01/29/2022) 30 tablet 0  ? spironolactone (ALDACTONE) 50 MG tablet Take 1 tablet (50 mg total) by mouth daily as needed (swelling). (Patient not taking: Reported on 01/29/2022) 30 tablet 0  ? ? ? ?Discharge Medications: ?Please see discharge summary for a list of discharge medications. ? ?Relevant Imaging Results: ? ?Relevant Lab Results: ? ? ?Additional Information ?[phone], Both Covid Vaccines and 1 Booster ? ?Raina Mina, LCSWA ? ? ? ? ?

## 2022-01-29 NOTE — Assessment & Plan Note (Addendum)
Likely secondary to hypervolemia hyponatremia in setting of cirrhosis with ascites  ?Check urine studies ?Needs paracentesis ?Strict I/O ?Trend  ? ?

## 2022-01-29 NOTE — ED Notes (Signed)
Patient transported to CT 

## 2022-01-29 NOTE — Assessment & Plan Note (Addendum)
Continue zoloft, monitor sodium  ?

## 2022-01-29 NOTE — ED Provider Notes (Signed)
Care of patient handed of to me by Sponseller, PA-C. Please see her note for full work-up to this point. Briefly, this is a 71 year old female who presents to the emergency department for fall. Reported multiple falls of the past few weeks. Lives alone, with family rotating at her house. 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  ?Physical Exam  ?BP (!) 94/41   Pulse 67   Temp 97.7 ?F (36.5 ?C) (Oral)   Resp 16   SpO2 97%  ? ?Physical Exam ?Vitals and nursing note reviewed.  ?Constitutional:   ?   Appearance: Normal appearance.  ?HENT:  ?   Head: Normocephalic and atraumatic.  ?Eyes:  ?   General: No scleral icterus. ?   Extraocular Movements: Extraocular movements intact.  ?Pulmonary:  ?   Effort: Pulmonary effort is normal. No respiratory distress.  ?Abdominal:  ?   Palpations: Abdomen is soft.  ?Musculoskeletal:     ?   General: Normal range of motion.  ?   Cervical back: Neck supple.  ?Skin: ?   General: Skin is warm and dry.  ?   Findings: No rash.  ?Neurological:  ?   General: No focal deficit present.  ?   Mental Status: She is alert.  ?Psychiatric:     ?   Mood and Affect: Mood normal.     ?   Behavior: Behavior normal.     ?   Thought Content: Thought content normal.     ?   Judgment: Judgment normal.  ? ? ?Procedures  ?Procedures ? ?ED Course / MDM  ?  ?Medical Decision Making ?Amount and/or Complexity of Data Reviewed ?Labs: ordered. ?Radiology: ordered. ? ?Risk ?OTC drugs. ?Decision regarding hospitalization. ? ? ?Plan at handoff is to walk patient with a walker to evaluate her mobility. If she is able to walk, will discharge home with PT. If unable with do ED PT consult and TOC for Rehab placement. ? ?Patient attempted to ambulate with walker. She was able to stand. However has intermittent lightheadedness and overall feels weak. PT consulted to evaluate the patient for recommendations. Patient was unable to get out of bed with PT due to orthostatic hypotension.  Report from previous PA-C overnight that patient has borderline hypotension but appears chronically just over 432 systolic. Patient systolic with PT on edge of bed 70's. She was given 1,500cc IVF. Continues to be orthostatic.  ?Will consult hospitalist for admission for pre-syncope, orthostatic hypotension. Will also need help with rehab placement once stabilized.  ? ?Spoke with Dr. Rogers Blocker, hospitalist who agrees to admit patient for ongoing work up.  ? ? ? ?  ?Mickie Hillier, PA-C ?01/29/22 1301 ? ?  ?Regan Lemming, MD ?01/29/22 1336 ? ?

## 2022-01-29 NOTE — H&P (Addendum)
?History and Physical  ? ? ?Patient: Diane Pruitt SLH:[id number] DOB: October 18, 1951 ?DOA: 01/29/2022 ?DOS: the patient was seen and examined on 01/29/2022 ?PCP: [name], MD  ?Patient coming from: Home - lives alone (son flew in from [place] and is staying with her) usually lives alone  ? ? ?Chief Complaint: weakness/lightheaded/falls  ? ?HPI: Diane Pruitt is a 71 y.o. female with medical history significant of  depression, anemia, previous stroke, hyperlipidemia, gout, CKD IIIa (Baseline Cr 1.3), HTN,  paroxysmal atrial fibrillation on Eliquis, hypothyroidism, Barrett's esophagus, cirrhosis of the liver (presumed alcohol-related, complicated by portal hypertensive gastropathy via EGD 01/05/2022) who presented to ED with weakness/lightheadedness/dizziness with multiple falls at home. She has fallen 2x in the past 3 days. Last night she tried to get up to go the bathroom and was really struggling getting out of bed. She didn't make it to the toilet and had a fall. She thinks she fell because she was weak and lightheaded. She has never lost consciousness and never hit her head. Her son states she has had a noticeable decline over the past few weeks, but more so over the last 5 days. She also has had poor PO intake over the last few days, but has been drinking okay.  ? ?Just had hospital stay from 3/26-3/31 for weakness and lightheadness secondary to IDA with hgb of 7.7 s/p 2 units of PRBC. Discharge hgb was 8.7.started on midodrine. BP running 15B systolic at discharge and was asymptomatic per notes. No intervention done as had EGD a few weeks earlier. Spironolactone and lasix were held. Eliquis held x 10 days. Had EGD at previous hospitalization with new diagnosis of cirrhosis and EGD showing no varices, but portal hypertensive gastropathy.  ? ?Denies any fever/chills, vision changes/headaches, chest pain or palpitations, shortness of breath or cough, abdominal pain, N/V/D, dysuria or leg swelling.  ? ? ?ER  Course:  vitals: afebrile, bp: 102/50, HR: 69, RR: 18, oxygen: 92%RA ?Pertinent labs: hgb: 10.7, sodium: 130, co2: 18, creatinine: 1.76, AST: 426, ALT: 205, troponin: 25>25,  ?CT abdomen/pelvis: cirrhosis with moderate volume ascites mildly progress compared to pre paracentesis CT on 01/03/22.  ?Given 1.5L IVF. PT/SW consulted.  ? ? ?Review of Systems: As mentioned in the history of present illness. All other systems reviewed and are negative. ?Past Medical History:  ?Diagnosis Date  ? Atrial fibrillation ([place])   ? Barrett esophagus   ? Chronic kidney disease (CKD), stage III (moderate) (HCC)   ? Cirrhosis ([place])   ? Dysrhythmia   ? Atrial Fibrillation 2 years ago  ? Hypertension   ? Ischemic stroke ([place]) 01/25/2018  ? Stroke [facility])   ? ?Past Surgical History:  ?Procedure Laterality Date  ? CESAREAN SECTION    ? ESOPHAGOGASTRODUODENOSCOPY (EGD) WITH PROPOFOL N/A 01/05/2022  ? Procedure: ESOPHAGOGASTRODUODENOSCOPY (EGD) WITH PROPOFOL;  Surgeon: Ronnette Juniper, MD;  Location: [facility];  Service: Gastroenterology;  Laterality: N/A;  ? EYE SURGERY    ? INTRAOCULAR LENS INSERTION Bilateral About 3 years ago  ? KYPHOPLASTY N/A 02/18/2020  ? Procedure: KYPHOPLASTY L2;  Surgeon: Melina Schools, MD;  Location: [place];  Service: Orthopedics;  Laterality: N/A;  60 mins  ? TONSILLECTOMY    ? TUBAL LIGATION    ? ?Social History:  reports that she has never smoked. She has never used smokeless tobacco. She reports that she does not drink alcohol and does not use drugs. ? ?Allergies  ?Allergen Reactions  ? Colchicine Other (See Comments)  ?  Stomach upset  ? ? ?  Family History  ?Problem Relation Age of Onset  ? Hypertension Mother   ? Hyperlipidemia Mother   ? Atrial fibrillation Mother   ? Hyperlipidemia Father   ? Atrial fibrillation Father   ? ? ?Prior to Admission medications   ?Medication Sig Start Date End Date Taking? Authorizing Provider  ?acetaminophen (TYLENOL) 500 MG tablet Take 1,000 mg by mouth every 6 (six) hours as needed  for headache or mild pain.   Yes [provider]  ?alendronate (FOSAMAX) 70 MG tablet Take 70 mg by mouth every Monday.   Yes [provider]  ?allopurinol (ZYLOPRIM) 100 MG tablet Take 200 mg by mouth daily. 01/20/20  Yes [provider]  ?amiodarone (PACERONE) 200 MG tablet Take 200 mg by mouth in the morning and at bedtime.  02/27/18  Yes [provider]  ?Cholecalciferol (VITAMIN D) 50 MCG (2000 UT) tablet Take 2,000 Units by mouth daily. 05/03/21  Yes [provider]  ?ezetimibe (ZETIA) 10 MG tablet Take 10 mg by mouth at bedtime. 01/25/20  Yes [provider]  ?iron polysaccharides (NIFEREX) 150 MG capsule Take 1 capsule (150 mg total) by mouth daily. 01/27/22  Yes Kathie Dike, MD  ?levothyroxine (SYNTHROID) 200 MCG tablet Take 200 mcg by mouth every morning. 12/26/21  Yes [provider]  ?midodrine (PROAMATINE) 5 MG tablet Take 1 tablet (5 mg total) by mouth 3 (three) times daily with meals. 01/26/22  Yes Kathie Dike, MD  ?Multiple Vitamin (MULTIVITAMIN) tablet Take 1 tablet by mouth daily.   Yes [provider]  ?Multiple Vitamins-Minerals (PRESERVISION AREDS 2 PO) Take 1 capsule by mouth daily.   Yes [provider]  ?ondansetron (ZOFRAN-ODT) 4 MG disintegrating tablet Take 4 mg by mouth every 4 (four) hours as needed for nausea or vomiting. 01/27/22  Yes [provider]  ?pantoprazole (PROTONIX) 40 MG tablet Take 1 tablet (40 mg total) by mouth 2 (two) times daily before a meal. 01/26/22 02/25/22 Yes Memon, Jolaine Artist, MD  ?polyethylene glycol (MIRALAX / GLYCOLAX) 17 g packet Take 17 g by mouth daily as needed for mild constipation. 01/26/22  Yes Kathie Dike, MD  ?rosuvastatin (CRESTOR) 40 MG tablet Take 40 mg by mouth at bedtime. 11/01/21  Yes [provider]  ?sertraline (ZOLOFT) 50 MG tablet Take 50 mg by mouth daily. 11/01/21  Yes [provider]  ?ELIQUIS 5 MG TABS tablet Take 1 tablet (5 mg total) by  mouth 2 (two) times daily. Resume from 02/03/2022 01/26/22   Kathie Dike, MD  ?furosemide (LASIX) 20 MG tablet Take 1 tablet (20 mg total) by mouth daily as needed for fluid. ?Patient not taking: Reported on 01/29/2022 01/26/22   Kathie Dike, MD  ?spironolactone (ALDACTONE) 50 MG tablet Take 1 tablet (50 mg total) by mouth daily as needed (swelling). ?Patient not taking: Reported on 01/29/2022 01/26/22   Kathie Dike, MD  ? ? ?Physical Exam: ?Vitals:  ? 01/29/22 1300 01/29/22 1315 01/29/22 1445 01/29/22 1515  ?BP: (!) 83/53 (!) 98/47 98/81 (!) 102/55  ?Pulse: 65 66 69 65  ?Resp: '13 17 17 15  '$ ?Temp:      ?TempSrc:      ?SpO2: 93% 96% 98% 97%  ? ?General:  Appears calm and comfortable and is in NAD. Pale appearing ?Eyes:  PERRL, EOMI, normal lids, iris ?ENT:  grossly normal hearing, lips & tongue, mmm; appropriate dentition ?Neck:  no LAD, masses or thyromegaly; no carotid bruits ?Cardiovascular:  RRR, no m/r/g. No LE edema.  ?Respiratory:  CTA bilaterally with no wheezes/rales/rhonchi.  Normal respiratory effort. ?Abdomen:  soft, distended with +fluid wave. NT, +diastasis recti, NABS ?Back:   normal alignment, no CVAT ?Skin:  no rash or induration seen on limited exam. Laceration to left forearm, wrapped.  ?Musculoskeletal:  grossly normal tone BUE/BLE, good ROM, no bony abnormality ?Lower extremity:  No LE edema.  Limited foot exam with no ulcerations.  2+ distal pulses. ?Psychiatric:  grossly normal mood and affect, speech fluent and appropriate, AOx3 ?Neurologic:  CN 2-12 grossly intact, moves all extremities in coordinated fashion, sensation intact ? ? ?Radiological Exams on Admission: ?Independently reviewed - see discussion in A/P where applicable ? ?CT ABDOMEN PELVIS WO CONTRAST ? ?Result Date: 01/29/2022 ?CLINICAL DATA:  71 year old female status post fall. Recently discontinued Eliquis. History of cirrhosis, ascites. Status post paracentesis on 01/04/2022. EXAM: CT ABDOMEN AND PELVIS WITHOUT CONTRAST  TECHNIQUE: Multidetector CT imaging of the abdomen and pelvis was performed following the standard protocol without IV contrast. RADIATION DOSE REDUCTION: This exam was performed according to the departmental do

## 2022-01-29 NOTE — Assessment & Plan Note (Addendum)
-  MELD score of 20 ?-secondary to alcohol (15+ years of daily vodka) ?-elevated LFTs today in setting of orthostasis and worsening ascites ?-holding lasix/spirinolactone ?-no evidence of SBP ?- needs paracentesis -but would like bp better. F/u GI recs.  ?-GI consulted  ?

## 2022-01-29 NOTE — Discharge Planning (Signed)
Pt currently active with [facility], [facility]) for [place] services as confirmed by [facility] with Marisue [facility] of [facility].  Pt will resume [facility] services should the disposition be home with home health.   ?

## 2022-01-29 NOTE — Assessment & Plan Note (Signed)
Check TSH with weakness ?Continue home dose of synthroid  ?

## 2022-01-29 NOTE — Assessment & Plan Note (Addendum)
Hold crestor and zetia with elevated LFTs ?

## 2022-01-29 NOTE — Assessment & Plan Note (Addendum)
baseline creatinine of 1.3, was 1.45 on hospital d/c on 3/30  ?Likely mixed picture in setting of orthostatic hypotension and possible hepatorenal syndrome  ?-UA unremarkable ?-hold nephrotoxic drugs ?-strict I/O ?-start midodrine at '5mg'$  TID (home dose, may need to be increased) to maintain goal MAP >65  ?-if no improvement with better blood pressures, may need IV albumin  ?-trend  ?

## 2022-01-29 NOTE — ED Notes (Signed)
Called PT for assessment prior to discharge ?

## 2022-01-29 NOTE — Assessment & Plan Note (Addendum)
71 year old female presenting with 3 day history of progressive weakness and falls with significant orthostatic hypotension ?-obs to tele ?-orthostatics + ?-given IVF boluses in Ed. Holding IVF with ascites and concern for hepatorenal syndrome.  ?-start back home midodrine '5mg'$  TID, may need this increased. Was in the 84Z systolic at recent hospital d/c and asymptomatic  ?-continue to hold spirolactone and lasix due to hypotension/dizziness/weakness and falls.  ?-PT and SW for rehab placement  ?

## 2022-01-29 NOTE — Evaluation (Signed)
Physical Therapy Evaluation ?Patient Details ?Name: Diane Pruitt ?MRN: [id number] ?DOB: [birth year] ?Today's Date: 01/29/2022 ? ?History of Present Illness ? Pt is a 71 y/o female presenting to the ED secondary to fall and dizziness. Pt with recent admission for anemia and AKI. PMH includes Afib, CVA with L sided weakness, anemia, gout, DM, CKD, dizziness, liver cirrhosis, MDD, HTN.  ?Clinical Impression ? Pt admitted secondary to problem above with deficits below. Pt requiring min A for bed mobility tasks. Once sitting, pt complaining of dizziness. BP checked and was at 79/39 (46). Returned to supine and BP elevated to 96/59 (71). Further mobility deferred. Per son, pt has been having falls and increased difficulty taking care of herself at home since d/c from hospital. Recommending SNF level therapies at d/c to increase independence and safety.  ?   ?   ? ?Recommendations for follow up therapy are one component of a multi-disciplinary discharge planning process, led by the attending physician.  Recommendations may be updated based on patient status, additional functional criteria and insurance authorization. ? ?Follow Up Recommendations Skilled nursing-short term rehab (<3 hours/day) ? ?  ?Assistance Recommended at Discharge Frequent or constant Supervision/Assistance  ?Patient can return home with the following ? A little help with walking and/or transfers;A little help with bathing/dressing/bathroom;Assistance with cooking/housework;Help with stairs or ramp for entrance;Assist for transportation ? ?  ?Equipment Recommendations None recommended by PT  ?Recommendations for Other Services ?    ?  ?Functional Status Assessment Patient has had a recent decline in their functional status and demonstrates the ability to make significant improvements in function in a reasonable and predictable amount of time.  ? ?  ?Precautions / Restrictions Precautions ?Precautions: Fall ?Precaution Comments: Watch BP; 2 falls since  discharge ?Restrictions ?Weight Bearing Restrictions: No  ? ?  ? ?Mobility ? Bed Mobility ?Overal bed mobility: Needs Assistance ?Bed Mobility: Supine to Sit, Sit to Supine ?  ?  ?Supine to sit: Min assist ?Sit to supine: Min guard ?  ?General bed mobility comments: Min A for trunk assist to come to sitting. Pt reporting dizziness in sitting and BP at 79/39 (46) so further mobility deferred. ?  ? ?Transfers ?  ?  ?  ?  ?  ?  ?  ?  ?  ?  ?  ? ?Ambulation/Gait ?  ?  ?  ?  ?  ?  ?  ?  ? ?Stairs ?  ?  ?  ?  ?  ? ?Wheelchair Mobility ?  ? ?Modified Rankin (Stroke Patients Only) ?  ? ?  ? ?Balance Overall balance assessment: Needs assistance ?Sitting-balance support: Feet supported, No upper extremity supported ?Sitting balance-Leahy Scale: Fair ?  ?  ?  ?  ?  ?  ?  ?  ?  ?  ?  ?  ?  ?  ?  ?  ?   ? ? ? ?Pertinent Vitals/Pain Pain Assessment ?Pain Assessment: No/denies pain  ? ? ?Home Living Family/patient expects to be discharged to:: Private residence ?Living Arrangements: Alone ?Available Help at Discharge: Family;Available PRN/intermittently ?Type of Home: House ?Home Access: Stairs to enter ?Entrance Stairs-Rails: None ?Entrance Stairs-Number of Steps: 1 ?  ?Home Layout: One level ?Home Equipment: Cane - single Barista (2 wheels) ?   ?  ?Prior Function Prior Level of Function : Needs assist ?  ?  ?  ?  ?  ?  ?Mobility Comments: Reports falls since discharge from hospital. Has been using RW. ?  ADLs Comments: Reports daughter has been assisting with ADLs. ?  ? ? ?Hand Dominance  ?   ? ?  ?Extremity/Trunk Assessment  ? Upper Extremity Assessment ?Upper Extremity Assessment: Generalized weakness ?  ? ?Lower Extremity Assessment ?Lower Extremity Assessment: Generalized weakness ?  ? ?Cervical / Trunk Assessment ?Cervical / Trunk Assessment: Kyphotic  ?Communication  ? Communication: No difficulties  ?Cognition Arousal/Alertness: Awake/alert ?Behavior During Therapy: [facility] for tasks assessed/performed ?Overall  Cognitive Status: Impaired/Different from baseline ?Area of Impairment: Safety/judgement ?  ?  ?  ?  ?  ?  ?  ?  ?  ?  ?  ?  ?Safety/Judgement: Decreased awareness of deficits ?  ?  ?General Comments: Decreased safety awareness noted ?  ?  ? ?  ?General Comments   ? ?  ?Exercises    ? ?Assessment/Plan  ?  ?PT Assessment Patient needs continued PT services  ?PT Problem List Decreased strength;Decreased mobility;Decreased activity tolerance;Decreased balance;Decreased knowledge of use of DME;Decreased safety awareness ? ?   ?  ?PT Treatment Interventions DME instruction;Gait training;Therapeutic exercise;Balance training;Functional mobility training;Therapeutic activities;Patient/family education;Stair training   ? ?PT Goals (Current goals can be found in the Care Plan section)  ?Acute Rehab PT Goals ?Patient Stated Goal: for pt to go to rehab per son ?PT Goal Formulation: With patient/family ?Time For Goal Achievement: 02/12/22 ?Potential to Achieve Goals: Fair ? ?  ?Frequency Min 2X/week ?  ? ? ?Co-evaluation   ?  ?  ?  ?  ? ? ?  ?AM-PAC PT "6 Clicks" Mobility  ?Outcome Measure Help needed turning from your back to your side while in a flat bed without using bedrails?: A Little ?Help needed moving from lying on your back to sitting on the side of a flat bed without using bedrails?: A Little ?Help needed moving to and from a bed to a chair (including a wheelchair)?: A Lot ?Help needed standing up from a chair using your arms (e.g., wheelchair or bedside chair)?: A Lot ?Help needed to walk in hospital room?: A Lot ?Help needed climbing 3-5 steps with a railing? : Total ?6 Click Score: 13 ? ?  ?End of Session Equipment Utilized During Treatment: Gait belt ?Activity Tolerance: Treatment limited secondary to medical complications (Comment) (+ orthostatics) ?Patient left: in bed;with call bell/phone within reach;with family/visitor present (on stretcher in ED) ?Nurse Communication: Mobility status ?PT Visit Diagnosis:  Unsteadiness on feet (R26.81);Muscle weakness (generalized) (M62.81);Difficulty in walking, not elsewhere classified (R26.2) ?  ? ?Time: 1660-6301 ?PT Time Calculation (min) (ACUTE ONLY): 12 min ? ? ?Charges:   PT Evaluation ?$PT Eval Moderate Complexity: 1 Mod ?  ?  ?   ? ? ?Reuel Derby, PT, DPT  ?Acute Rehabilitation Services  ?Pager: [phone] ?Office: [phone] ? ? ?[place] ?01/29/2022, 10:12 AM ?

## 2022-01-29 NOTE — Assessment & Plan Note (Signed)
hgb at recent discharge was 8.7 and is 10.7 today ?EGD: 01/05/22: NO varices, portal hypertensive gastropathy ?Continue to hold eliquis (10 days post d/c. F/u with GI on 4/6) ?Continue oral iron supplmentation ? ?

## 2022-01-29 NOTE — Plan of Care (Signed)
The patient is admitted to [address] with the diagnosis of orthostatic hypotension. A & O x 4. Denied any acute pain . Full assessment to epic completed. Patient voiced no concern. Will continue to monitor. ?

## 2022-01-29 NOTE — ED Triage Notes (Signed)
Pt reported to ED with c/o fall this AM  while trying to walk to bathroom. Pt recently discontinued Eliquis two days ago. Pt denies hitting head but is extremely shaky with visible tremors. Pt states that is abnormal for her and that she had a difficult time getting off floor even with assistance from her son.  ?

## 2022-01-29 NOTE — Consult Note (Addendum)
Referring Provider: TRH ?Primary Care Physician:  [name], MD ?Primary Gastroenterologist:  Dr. Therisa Doyne ? ?Reason for Consultation:  Cirrhosis and elevated LFTs ? ?HPI: Diane Pruitt is a 71 y.o. 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 GI consulted for cirrhosis and elevated LFTs.  ? ?She presented to the ED on 01/29/2022 for progressive weakness an lightheadedness. She has fallen twice in the last 3 days. She denies head trauma. Her resting BP in the ED was 98/62 on evaluation. She notes her abdomen has become increasingly distended over the last several days. She denies abdominal pain, rectal bleeding, nausea, vomiting, hematemesis, fever and chills. She denies alcohol use since her diagnosis of cirrhosis. On arrival at the ED her AST and ALT were elevated compared to previous admission. She is unsure of which medications she is taking at home. She notes she recently started a medicine to increase her blood pressure, she is unsure which medication it is. She is taking Eliquis 5 mg twice daily.  ? ?Notes she is having on average one bowel movement daily.  ? ?Patient was supposed to have appointment with Dr. Therisa Doyne this morning.  ? 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  ?Previous infectious and autoimmune hepatitis work up unrevealing.  ? ?She was recently discharged on 3/31 from admission for symptomatic anemia and lightheadedness that required blood transfusion x2 units.  ?Prior to that she was  also was recently hospitalized at [facility] from 3/8 until 3/11 after presenting with acute blood loss anemia and new  diagnosis of hepatic cirrhosis.  Patient was transfused 1 unit of packed red blood cells.  EGD was performed on 3/10 by Dr. Therisa Doyne which showed portal hypertensive gastropathy without evidence of active bleeding.  Patient was started on Lasix and spironolactone at time of discharge.  Patient resumed Eliquis at time of discharge. ? ?EGD 01/05/22 with Dr. Therisa Doyne for IDA and new cirrhosis - Normal esophagus, portal hyptertensive gastropathy, 3 duodenal polyps resected. Recall 1 year to screen for esophageal varices. ?EGD/colonoscopy 05/2021 done for heme positive stool with Dr. Cristina Gong ?EGD: Barrett's esophagus, normal stomach, normal duodenum, 2 cm hiatal hernia. repeat 3 years. ?Colonoscopy: 11 tubular adenomas, diverticulosis in sigmoid colon repeat 3 years. ? ?Past Medical History:  ?Diagnosis Date  ? Atrial fibrillation ([place])   ? Barrett esophagus   ? Chronic kidney disease (CKD), stage III (moderate) (HCC)   ? Cirrhosis ([place])   ? Dysrhythmia   ? Atrial Fibrillation 2 years ago  ? Hypertension   ? Ischemic stroke ([place]) 01/25/2018  ? Stroke [facility])   ? ? ?Past Surgical History:  ?Procedure Laterality Date  ? CESAREAN SECTION    ? ESOPHAGOGASTRODUODENOSCOPY (EGD) WITH PROPOFOL N/A 01/05/2022  ? Procedure: ESOPHAGOGASTRODUODENOSCOPY (EGD) WITH PROPOFOL;  Surgeon: Ronnette Juniper, MD;  Location: [facility];  Service: Gastroenterology;  Laterality: N/A;  ? EYE SURGERY    ? INTRAOCULAR LENS INSERTION Bilateral About 3 years ago  ? KYPHOPLASTY N/A 02/18/2020  ? Procedure: KYPHOPLASTY L2;  Surgeon: Melina Schools, MD;  Location: [facility];  Service: Orthopedics;  Laterality: N/A;  60 mins  ? TONSILLECTOMY    ? TUBAL LIGATION    ? ? ?Prior to Admission medications   ?  Medication Sig Start Date End Date Taking? Authorizing Provider  ?acetaminophen (TYLENOL) 500 MG tablet Take 1,000 mg by mouth every 6 (six) hours as needed for headache or mild pain.   Yes [provider]  ?alendronate (FOSAMAX) 70 MG tablet Take 70 mg by mouth  every Monday.   Yes [provider]  ?allopurinol (ZYLOPRIM) 100 MG tablet Take 200 mg by mouth daily. 01/20/20  Yes [provider]  ?amiodarone (PACERONE) 200 MG tablet Take 200 mg by mouth in the morning and at bedtime.  02/27/18  Yes [provider]  ?Cholecalciferol (VITAMIN D) 50 MCG (2000 UT) tablet Take 2,000 Units by mouth daily. 05/03/21  Yes [provider]  ?ezetimibe (ZETIA) 10 MG tablet Take 10 mg by mouth at bedtime. 01/25/20  Yes [provider]  ?iron polysaccharides (NIFEREX) 150 MG capsule Take 1 capsule (150 mg total) by mouth daily. 01/27/22  Yes Kathie Dike, MD  ?levothyroxine (SYNTHROID) 200 MCG tablet Take 200 mcg by mouth every morning. 12/26/21  Yes [provider]  ?midodrine (PROAMATINE) 5 MG tablet Take 1 tablet (5 mg total) by mouth 3 (three) times daily with meals. 01/26/22  Yes Kathie Dike, MD  ?Multiple Vitamin (MULTIVITAMIN) tablet Take 1 tablet by mouth daily.   Yes [provider]  ?Multiple Vitamins-Minerals (PRESERVISION AREDS 2 PO) Take 1 capsule by mouth daily.   Yes [provider]  ?ondansetron (ZOFRAN-ODT) 4 MG disintegrating tablet Take 4 mg by mouth every 4 (four) hours as needed for nausea or vomiting. 01/27/22  Yes [provider]  ?pantoprazole (PROTONIX) 40 MG tablet Take 1 tablet (40 mg total) by mouth 2 (two) times daily before a meal. 01/26/22 02/25/22 Yes Memon, Jolaine Artist, MD  ?polyethylene glycol (MIRALAX / GLYCOLAX) 17 g packet Take 17 g by mouth daily as needed for mild constipation. 01/26/22  Yes Kathie Dike, MD  ?rosuvastatin (CRESTOR) 40 MG tablet Take 40 mg by mouth at bedtime. 11/01/21  Yes [provider]  ?sertraline (ZOLOFT) 50 MG tablet Take 50 mg by mouth daily. 11/01/21  Yes [provider]  ?ELIQUIS 5 MG TABS tablet Take 1 tablet (5 mg total) by mouth 2 (two) times daily. Resume from 02/03/2022 01/26/22   Kathie Dike, MD  ?furosemide (LASIX) 20 MG tablet Take  1 tablet (20 mg total) by mouth daily as needed for fluid. ?Patient not taking: Reported on 01/29/2022 01/26/22   Kathie Dike, MD  ?spironolactone (ALDACTONE) 50 MG tablet Take 1 tablet (50 mg total) by mouth daily as needed (swelling). ?Patient not taking: Reported on 01/29/2022 01/26/22   Kathie Dike, MD  ? ? ?Scheduled Meds: ? allopurinol  200 mg Oral Daily  ? amiodarone  200 mg Oral BID  ? bacitracin   Topical BID  ? cholecalciferol  2,000 Units Oral Daily  ? iron polysaccharides  150 mg Oral Daily  ? levothyroxine  200 mcg Oral q morning  ? midodrine  5 mg Oral TID WC  ? multivitamin  1 tablet Oral Daily  ? pantoprazole  40 mg Oral BID AC  ? sertraline  50 mg Oral Daily  ? ?Continuous Infusions: ? sodium chloride 75 mL/hr at 01/29/22 1404  ? ?PRN Meds:.ondansetron, traMADol ? ?Allergies as of 01/29/2022 - Review Complete 01/29/2022  ?Allergen Reaction Noted  ? Colchicine Other (See Comments) 07/17/2021  ? ? ?Family History  ?Problem Relation Age of Onset  ? Hypertension Mother   ? Hyperlipidemia Mother   ? Atrial fibrillation Mother   ?  Hyperlipidemia Father   ? Atrial fibrillation Father   ? ? ?Social History  ? ?Socioeconomic History  ? Marital status: Divorced  ?  Spouse name: Not on file  ? Number of children: Not on file  ? Years of education: Not on file  ? Highest education level: Not on file  ?Occupational History  ? Not on file  ?Tobacco Use  ? Smoking status: Never  ? Smokeless tobacco: Never  ?Vaping Use  ? Vaping Use: Never used  ?Substance and Sexual Activity  ? Alcohol use: Never  ? Drug use: Never  ? Sexual activity: Not on file  ?Other Topics Concern  ? Not on file  ?Social History Narrative  ? Not on file  ? ?Social Determinants of Health  ? ?Financial Resource Strain: Not on file  ?Food Insecurity: Not on file  ?Transportation Needs: Not on file  ?Physical Activity: Not on file  ?Stress: Not on file  ?Social Connections: Not on file  ?Intimate Partner Violence: Not on file  ? ? ?Review of  Systems: Review of Systems  ?Constitutional:  Negative for chills, fever and weight loss.  ?HENT:  Negative for hearing loss and tinnitus.   ?Eyes:  Negative for blurred vision and double vision.  ?[place]

## 2022-01-30 ENCOUNTER — Other Ambulatory Visit: Payer: Self-pay

## 2022-01-30 DIAGNOSIS — N1831 Chronic kidney disease, stage 3a: Secondary | ICD-10-CM | POA: Diagnosis present

## 2022-01-30 DIAGNOSIS — R188 Other ascites: Secondary | ICD-10-CM | POA: Diagnosis not present

## 2022-01-30 DIAGNOSIS — I480 Paroxysmal atrial fibrillation: Secondary | ICD-10-CM | POA: Diagnosis not present

## 2022-01-30 DIAGNOSIS — Z7901 Long term (current) use of anticoagulants: Secondary | ICD-10-CM | POA: Diagnosis not present

## 2022-01-30 DIAGNOSIS — E871 Hypo-osmolality and hyponatremia: Secondary | ICD-10-CM | POA: Diagnosis not present

## 2022-01-30 DIAGNOSIS — K7030 Alcoholic cirrhosis of liver without ascites: Secondary | ICD-10-CM | POA: Diagnosis present

## 2022-01-30 DIAGNOSIS — Z7983 Long term (current) use of bisphosphonates: Secondary | ICD-10-CM | POA: Diagnosis not present

## 2022-01-30 DIAGNOSIS — Z79890 Hormone replacement therapy (postmenopausal): Secondary | ICD-10-CM | POA: Diagnosis not present

## 2022-01-30 DIAGNOSIS — K767 Hepatorenal syndrome: Secondary | ICD-10-CM | POA: Diagnosis not present

## 2022-01-30 DIAGNOSIS — N179 Acute kidney failure, unspecified: Secondary | ICD-10-CM | POA: Diagnosis not present

## 2022-01-30 DIAGNOSIS — Z79899 Other long term (current) drug therapy: Secondary | ICD-10-CM | POA: Diagnosis not present

## 2022-01-30 DIAGNOSIS — F1010 Alcohol abuse, uncomplicated: Secondary | ICD-10-CM | POA: Diagnosis present

## 2022-01-30 DIAGNOSIS — D509 Iron deficiency anemia, unspecified: Secondary | ICD-10-CM | POA: Diagnosis not present

## 2022-01-30 DIAGNOSIS — N289 Disorder of kidney and ureter, unspecified: Secondary | ICD-10-CM | POA: Diagnosis not present

## 2022-01-30 DIAGNOSIS — K7031 Alcoholic cirrhosis of liver with ascites: Secondary | ICD-10-CM | POA: Diagnosis present

## 2022-01-30 DIAGNOSIS — E869 Volume depletion, unspecified: Secondary | ICD-10-CM | POA: Diagnosis not present

## 2022-01-30 DIAGNOSIS — Z888 Allergy status to other drugs, medicaments and biological substances status: Secondary | ICD-10-CM | POA: Diagnosis not present

## 2022-01-30 DIAGNOSIS — I951 Orthostatic hypotension: Secondary | ICD-10-CM | POA: Diagnosis present

## 2022-01-30 DIAGNOSIS — R296 Repeated falls: Secondary | ICD-10-CM | POA: Diagnosis present

## 2022-01-30 DIAGNOSIS — I959 Hypotension, unspecified: Secondary | ICD-10-CM | POA: Diagnosis not present

## 2022-01-30 DIAGNOSIS — Z8673 Personal history of transient ischemic attack (TIA), and cerebral infarction without residual deficits: Secondary | ICD-10-CM | POA: Diagnosis not present

## 2022-01-30 DIAGNOSIS — E1122 Type 2 diabetes mellitus with diabetic chronic kidney disease: Secondary | ICD-10-CM | POA: Diagnosis not present

## 2022-01-30 DIAGNOSIS — F32A Depression, unspecified: Secondary | ICD-10-CM | POA: Diagnosis present

## 2022-01-30 DIAGNOSIS — E039 Hypothyroidism, unspecified: Secondary | ICD-10-CM | POA: Diagnosis present

## 2022-01-30 DIAGNOSIS — K7460 Unspecified cirrhosis of liver: Secondary | ICD-10-CM | POA: Diagnosis not present

## 2022-01-30 DIAGNOSIS — R69 Illness, unspecified: Secondary | ICD-10-CM | POA: Diagnosis not present

## 2022-01-30 DIAGNOSIS — D500 Iron deficiency anemia secondary to blood loss (chronic): Secondary | ICD-10-CM | POA: Diagnosis present

## 2022-01-30 DIAGNOSIS — E785 Hyperlipidemia, unspecified: Secondary | ICD-10-CM | POA: Diagnosis present

## 2022-01-30 DIAGNOSIS — I1310 Hypertensive heart and chronic kidney disease without heart failure, with stage 1 through stage 4 chronic kidney disease, or unspecified chronic kidney disease: Secondary | ICD-10-CM | POA: Diagnosis present

## 2022-01-30 DIAGNOSIS — K766 Portal hypertension: Secondary | ICD-10-CM | POA: Diagnosis not present

## 2022-01-30 LAB — BASIC METABOLIC PANEL
Anion gap: 7 (ref 5–15)
BUN: 19 mg/dL (ref 8–23)
CO2: 20 mmol/L — ABNORMAL LOW (ref 22–32)
Calcium: 8 mg/dL — ABNORMAL LOW (ref 8.9–10.3)
Chloride: 110 mmol/L (ref 98–111)
Creatinine, Ser: 1.62 mg/dL — ABNORMAL HIGH (ref 0.44–1.00)
GFR, Estimated: 34 mL/min — ABNORMAL LOW (ref >60)
Glucose, Bld: 80 mg/dL (ref 70–99)
Potassium: 4.4 mmol/L (ref 3.5–5.1)
Sodium: 137 mmol/L (ref 135–145)

## 2022-01-30 LAB — CBC
HCT: 32.5 % — ABNORMAL LOW (ref 36.0–46.0)
Hemoglobin: 9.9 g/dL — ABNORMAL LOW (ref 12.0–15.0)
MCH: 26.1 pg (ref 26.0–34.0)
MCHC: 30.5 g/dL (ref 30.0–36.0)
MCV: 85.8 fL (ref 80.0–100.0)
Platelets: 145 10*3/uL — ABNORMAL LOW (ref 150–400)
RBC: 3.79 MIL/uL — ABNORMAL LOW (ref 3.87–5.11)
RDW: 17.7 % — ABNORMAL HIGH (ref 11.5–15.5)
WBC: 7.5 10*3/uL (ref 4.0–10.5)
nRBC: 0 % (ref 0.0–0.2)

## 2022-01-30 LAB — OSMOLALITY, URINE: Osmolality, Ur: 171 mOsm/kg — ABNORMAL LOW (ref 300–900)

## 2022-01-30 LAB — TSH: TSH: 16.682 u[IU]/mL — ABNORMAL HIGH (ref 0.350–4.500)

## 2022-01-30 MED ORDER — ALBUMIN HUMAN 25 % IV SOLN
12.5000 g | Freq: Four times a day (QID) | INTRAVENOUS | Status: AC
  Administered 2022-01-30 – 2022-01-31 (×4): 12.5 g via INTRAVENOUS
  Filled 2022-01-30 (×4): qty 50

## 2022-01-30 MED ORDER — MIDODRINE HCL 5 MG PO TABS
10.0000 mg | ORAL_TABLET | Freq: Three times a day (TID) | ORAL | Status: DC
  Administered 2022-01-30 – 2022-02-01 (×6): 10 mg via ORAL
  Filled 2022-01-30 (×7): qty 2

## 2022-01-30 MED ORDER — ADULT MULTIVITAMIN W/MINERALS CH
1.0000 | ORAL_TABLET | Freq: Every day | ORAL | Status: DC
  Administered 2022-01-30 – 2022-02-01 (×3): 1 via ORAL
  Filled 2022-01-30 (×3): qty 1

## 2022-01-30 NOTE — Consult Note (Signed)
? ?  [facility] CM Inpatient Consult ? ? ?01/30/2022 ? ?[name] ?June 13, 1951 ?[id number] ? ?[place] [facility] [ACO] Patient: Diane Pruitt Medicare ? ?Primary Care Provider:  [name], MD, [place] [place] at [facility] noted, is an embedded provider with a Chronic Care Management team and program, and is listed for the transition of care follow up and appointments. ? ?Patient was screened for Embedded practice service needs for chronic care management and in encounter noted active note with Embedded CCM. Patient is reviewed for request for [facility] readmission screen, patient currently showing as Observation status ? ?Plan: Updated referral for readmission screen of findings for possible CCM with Embedded provider noted. ? ?Please contact for further questions, ? ?Natividad Brood, RN BSN CCM ?[facility] Liaison ? [phone] business mobile phone ?Toll free office [phone]  ?Fax number: [phone] ?[email or web address] ? ? ? ?

## 2022-01-30 NOTE — TOC Initial Note (Addendum)
Transition of Care (TOC) - Initial/Assessment Note  ? ? ?Patient Details  ?Name: Diane Pruitt ?MRN: [id number] ?Date of Birth: 1951/09/27 ? ?Transition of Care (TOC) CM/SW Contact:    ?Benard Halsted, LCSW ?Phone Number: ?01/30/2022, 12:44 PM ? ?Clinical Narrative:                 ?11:15am-CSW received consult for possible SNF placement at time of discharge. CSW spoke with patient and her two sons from out of town (she also has one son and daughter locally). Patient lives alone. Patient expressed understanding of PT recommendation and is agreeable to SNF placement at time of discharge. CSW discussed insurance authorization process and provided Medicare SNF ratings list. Patient has received COVID vaccines. CSW will send out referrals for review.  ? ?CSW provided SNF bed offers and sons will tour facilities and let CSW know their decision so that insurance authorization process can be started.  ? ?1pm-Family has selected [facility]. CSW requested facility begin insurance authorization process.  ? ?Skilled Nursing Rehab Facilities-   [email or web address]   Ratings out of 5 possible   ?Name Address  Phone # Quality Care Staffing Health Inspection Overall  ?[facility] [address], [place] '5 5 2 4  '$ ?[facility]  [address], [place] [phone] '4 2 5 5  '$ ?[facility] [place] '4 1 1 1  '$ ?[facility] [place], [place] '2 2 4 4  '$ ?[facility] [address] [place] '1 1 2 1  '$ ?[facility] [place] [phone] '2 1 4 3  '$ ?[place] '5 2 2 3  '$ ?[facility] [address] [place] '5 2 2 3  '$ ?[address] ([place]) [address] [place] [phone] '5 1 2 2  '$ ?[facility] [address], [place] [phone] '4 1 1 1  '$ ?[facility]  [address], [place] [phone] '4 1 2 1  '$ ?[facility] [place] [phone] '4 1 1 1  '$ ?        ?[place], [place]      ?[facility] [place] '4 2 3 3  '$ ?[facility] [place] [address] [place] '3 1 5 4  '$ ?[place], [place] 119, [place] [phone] '2 1 1 1  '$ ?[facility] [address] [place] [phone] '2 2 3 3  '$ ?        ?[address] (no [facility]) [address], [place]er [phone] '4 5 5 5  '$ ?[facility] (No Humana) [address] [place], [place] '4 1 4 3  '$ ?[facility] (No UHC) [address] [place] [phone] '5 5 5 5  '$ ?[facility] [address], [place] [phone] '3 2 4 4  '$ ?Dustin Flock [address] [place] [phone] '3 3 4 4  '$ ?[place] [address], [place] '1 1 2 1  '$ ?[facility] [address], [place] [phone] '2 1 1 1  '$ ?[facility] [place], [place] '5 2 4 5  '$ ?[facility] [address] [place] '3 1 1 1  '$ ?[facility] [place], [place] '2 1 2 1  '$ ?        ?[place], [place] [phone] '1 1 1 1  '$ ?  [place] [address] [place]  [phone] '2 4 2 2  '$ ?[facility] [place] [address] [place] [phone] '5 2 3 4  '$ ?[place] [address] [place] '2 1 1 1  '$ ?[place] (No Humana) [address] [place] '2 1 3 2  '$ ?[facility] [address], [place] [phone] '3 1 1 1  '$ ?        ?[facility] [place], [place] '5 4 5 5  '$ ?[facility])  [address], [place] '2 2 3 3  '$ ?[facility]) [place], [place] '3 2 4 4  '$ ?[facility] [address], [place] '4 3 4 4  '$ ?[address] [place], [place] '3 3 1 1  '$ ?[facility]) [address] [place] [phone] '2 2 4 4  '$ ? ? ? ?Expected Discharge Plan: [place] ?Barriers to Discharge: Ship broker, Continued Medical Work up ? ? ?Patient Goals and CMS Choice ?Patient states their goals for this hospitalization and ongoing recovery are:: Rehab ?CMS Medicare.gov Compare Post Acute Care list provided to:: Patient ?Choice offered to / list presented to : Patient, Adult Children ? ?Expected Discharge Plan and Services ?Expected Discharge Plan: [place] ?In-house Referral: Clinical Social Work ?  ?Post Acute Care Choice: [place] ?Living arrangements for the past 2 months: [place] ?                ?  ?  ?  ?  ?  ?  ?  ?  ?  ?  ? ?Prior Living Arrangements/Services ?Living arrangements for the past 2 months: [place] ?Lives with:: Self ?Patient language and need for interpreter reviewed:: Yes ?Do you feel safe going back to the place where you live?: Yes      ?Need for Family Participation in Patient Care: Yes (Comment) ?Care giver support system in place?: Yes (comment) ?Current home services: DME ?Criminal Activity/Legal Involvement Pertinent to Current Situation/Hospitalization: No - Comment as needed ? ?Activities of Daily Living ?Home Assistive Devices/Equipment: None ?ADL Screening (condition at time of admission) ?Patient's cognitive ability adequate to safely complete daily activities?: Yes ?Is the patient deaf or have difficulty hearing?: No ?Does the patient have difficulty seeing, even when wearing glasses/contacts?: No ?Does the patient have difficulty concentrating, remembering, or making decisions?: No ?Patient able to express need for assistance with ADLs?: Yes ?Does the patient have difficulty dressing or bathing?: No ?Independently performs ADLs?: Yes (appropriate for  developmental age) ?Does the patient have difficulty walking or climbing stairs?: No ?Weakness of Legs: Both ?Weakness of Arms/Hands: Both ? ?Permission Sought/Granted ?Permission sought to share information with : Facility Sport and exercise psychologist, Family Supports ?Permission granted to share information with : Yes, Verbal Permission Granted ? Share Information with NAME: Aaron Edelman ? Permission granted to share info w AGENCY: SNFs ? Permission granted to share info w Relationship: Son ? Permission granted to share info w Contact Information: [phone] ? ?Emotional Assessment ?Appearance:: Appears stated age ?Attitude/Demeanor/Rapport: Engaged ?Affect (typically observed): Accepting, Appropriate ?Orientation: : Oriented to Self, Oriented to Place, Oriented to  Time, Oriented to Situation ?Alcohol / Substance Use: Not Applicable ?Psych Involvement: No (comment) ? ?Admission diagnosis:  Orthostatic hypotension [I95.1] ?Weakness [R53.1] ?Postural dizziness with near syncope [R42, R55] ?Patient Active Problem List  ? Diagnosis Date Noted  ? Orthostatic hypotension with  weakness and falls  01/29/2022  ? Hyponatremia 01/29/2022  ? Weakness 01/29/2022  ? Portal hypertensive gastropathy ([place]) 01/25/2022  ? Hyperlipidemia 01/21/2022  ? Hypokalemia 01/06/2022  ? Acute blood loss anemia 01/04/2022  ? 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 Acute renal failure superimposed on stage 3a chronic kidney disease ([place]) 01/04/2022  ? Elevated LFTs 01/04/2022  ? Gout 01/04/2022  ? Essential hypertension 01/04/2022  ? Depression 01/04/2022  ? GI bleed 01/04/2022  ? Iron deficiency anemia due to chronic blood loss 01/03/2022  ? Unspecified abnormal finding in specimens from other organs, systems and tissues 11/20/2021  ? Age-related osteoporosis without current pathological fracture 11/01/2021  ? Chronic gouty arthritis 11/01/2021  ? Excessive sleepiness 11/01/2021  ? History of embolic stroke  56/38/9373  ? History of tobacco use 11/01/2021  ? Hypercoagulable state ([place]) 11/01/2021  ? Iron deficiency anemia 11/01/2021  ? Obesity due to excess calories 11/01/2021  ? Personal history of colonic polyps 01/0

## 2022-01-30 NOTE — Care Management Obs Status (Signed)
MEDICARE OBSERVATION STATUS NOTIFICATION ? ? ?Patient Details  ?Name: Diane Pruitt ?MRN: [id number] ?Date of Birth: Mar 01, 1951 ? ? ?Medicare Observation Status Notification Given:  Yes ? ? ? ?Benard Halsted, LCSW ?01/30/2022, 11:05 AM ?

## 2022-01-30 NOTE — Progress Notes (Signed)
[facility] Progress Note ? ?Diane Pruitt 71 y.o. [birth year] ? ?CC:  Cirrhosis with Ascites ? ? ?Subjective: ?Patient states she is doing well today.  Not feeling lightheaded.  Denies abdominal pain, nausea, vomiting.  Blood pressure still appears low around 80/50 ? ?ROS : Review of Systems  ?Constitutional:  Negative for chills and fever.  ?Gastrointestinal:  Negative for abdominal pain, blood in stool, constipation, diarrhea, heartburn, melena, nausea and vomiting.   ? ? ?Objective: ?Vital signs in last 24 hours: ?Vitals:  ? 01/30/22 0805 01/30/22 0900  ?BP: (!) 82/51 (!) 102/52  ?Pulse: 61 68  ?Resp: 14 (!) 33  ?Temp: 98 ?F (36.7 ?C)   ?SpO2: 97% 96%  ? ? ?Physical Exam: ? ?General:  Alert, cooperative, no distress, appears stated age  ?Head:  Normocephalic, without obvious abnormality, atraumatic  ?Eyes:  Anicteric sclera, EOM's intact  ?Lungs:   Clear to auscultation bilaterally, respirations unlabored  ?Heart:  Regular rate and rhythm, S1, S2 normal  ?Abdomen:   Abdominal distention, soft, nontender, bowel sounds active all four quadrants,  no masses,   ? ? ?Lab Results: ?Recent Labs  ?  01/29/22 ?0350 01/30/22 ?0118  ?NA 130* 137  ?K 4.2 4.4  ?CL 104 110  ?CO2 18* 20*  ?GLUCOSE 101* 80  ?BUN 20 19  ?CREATININE 1.76* 1.62*  ?CALCIUM 8.0* 8.0*  ? ?Recent Labs  ?  01/29/22 ?0350  ?AST 426*  ?ALT 205*  ?ALKPHOS 86  ?BILITOT 0.8  ?PROT 6.2*  ?ALBUMIN 2.4*  ? ?Recent Labs  ?  01/29/22 ?0350 01/30/22 ?0118  ?WBC 8.5 7.5  ?NEUTROABS 6.5  --   ?HGB 10.7* 9.9*  ?HCT 36.5 32.5*  ?MCV 89.2 85.8  ?PLT 156 145*  ? ?Recent Labs  ?  01/29/22 ?0350  ?LABPROT 14.9  ?INR 1.2  ? ? ? ? ?Assessment ?Alcoholic cirrhosis with ascites ?-MELD NA score 20 4/3 ?-AST 426/ALT 205/alk phos 86 ?-BUN 19, creatinine 1.62, improving ?-GFR 34 ?-Hgb 9.9 ?-No leukocytosis ?-EGD 12/2021: Negative for esophageal varices, showed mild portal hypertensive gastropathy ? ? ?Plan: ?Continuing to experience hypotension in the setting of ARF  and cirrhosis.  Creatinine starting to improve, though has not normalized.  GFR 34. ?Continue to hold off on paracentesis at this time due to worsening volume status with renal insufficiency ?Continue supportive care ?[facility] will follow ? ?Kipp Shank Radford Pax PA-C ?01/30/2022, 9:28 AM ? ?Contact #  [phone]  ?

## 2022-01-30 NOTE — Progress Notes (Signed)
Initial Nutrition Assessment ? ?DOCUMENTATION CODES:  ? ?Not applicable ? ?INTERVENTION:  ? ?New weight needed. RN notified. ?Ensure Enlive po BID, each supplement provides 350 kcal and 20 grams of protein. ?Multivitamin w/ minerals daily ? ?NUTRITION DIAGNOSIS:  ? ?Increased nutrient needs related to chronic illness (cirrhosis) as evidenced by estimated needs. ? ?GOAL:  ? ?Patient will meet greater than or equal to 90% of their needs ? ?MONITOR:  ? ?Supplement acceptance, PO intake, Labs, Weight trends ? ?REASON FOR ASSESSMENT:  ? ?Malnutrition Screening Tool ?  ? ?ASSESSMENT:  ? ?71 y.o. female presented to the ED with lightheadedness, weakness, dizziness, and multiple falls. Pt recently admitted 3/26-3/31 for weakness 2/2 IDA. PMH includes cirrhosis, CKD IIIa, HTN, Barrett's esophagus, and a previous stroke. Pt admitted with orthostatic hypotension with weakness and falls and AKI on CKD.  ? ?Pt resting in bed at time of RD visit. Pt's 2 sons entered later on during RD visit.  ? ?Pt reports that he appetite has been "so-so." States that if people are staying with her then she tends to eat regular meal, but if she is alone she does not eat as well. Pt reports that she has recently started to drink Ensure's at home; RD observed one on pt bedside table. Pt endorses becoming full very quickly; discussed that this is common with fluid retained around the belly and recommend eating 4-6 small frequent meals.  ? ?Pt unable to provide UBW, but reports that she has had some weight loss but is not sure how much. Pt reports that she use to not use any assistance with ambulating, but now uses a walker. Unable to assess pt weight loss due to lack of weights within the past year recorded within EMR. ? ?Discussed that we will continue with Ensure's.  ? ?Pt or family with no other questions at this time.   ? ?Medications reviewed and include: Vitamin D3, Niferex, MVI, Protonix ?Labs reviewed: Creatinine 1.62 ? ?NUTRITION - FOCUSED  PHYSICAL EXAM: ? ?Flowsheet Row Most Recent Value  ?Orbital Region No depletion  ?Upper Arm Region No depletion  ?Thoracic and Lumbar Region No depletion  ?Buccal Region No depletion  ?Temple Region No depletion  ?Clavicle Bone Region No depletion  ?Clavicle and Acromion Bone Region No depletion  ?Scapular Bone Region No depletion  ?Dorsal Hand Mild depletion  ?Patellar Region No depletion  ?Anterior Thigh Region No depletion  ?Posterior Calf Region No depletion  ?Edema (RD Assessment) None  ?Hair Reviewed  ?Eyes Reviewed  ?Mouth Reviewed  ?Skin Reviewed  ?Nails Reviewed  ? ?  ? ? ?Diet Order:   ?Diet Order   ? ?       ?  Diet regular Room service appropriate? Yes; Fluid consistency: Thin  Diet effective now       ?  ? ?  ?  ? ?  ? ? ?EDUCATION NEEDS:  ? ?No education needs have been identified at this time ? ?Skin:  Skin Assessment: Reviewed RN Assessment ? ?Last BM:  4/2 ? ?Height:  ? ?Ht Readings from Last 1 Encounters:  ?01/21/22 '5\' 2"'$  (1.575 m)  ? ? ?Weight:  ? ?Wt Readings from Last 1 Encounters:  ?01/21/22 75.3 kg  ? ? ?Ideal Body Weight:  50 kg ? ?BMI:  There is no height or weight on file to calculate BMI. ? ?Estimated Nutritional Needs:  ? ?Kcal:  1900-2100 ? ?Protein:  95-110 grams ? ?Fluid:  >/= 1.9 L ? ? ? ?Hermina Barters  RD, LDN ?Clinical Dietitian ?See AMiON for contact information.  ? ?

## 2022-01-30 NOTE — Evaluation (Signed)
Occupational Therapy Evaluation ?Patient Details ?Name: Diane Pruitt ?MRN: [id number] ?DOB: 10/01/1951 ?Today's Date: 01/30/2022 ? ? ?History of Present Illness Pt is a 71 y/o female presenting to the ED secondary to fall and dizziness. Pt with recent admission for anemia and AKI. PMH includes Afib, CVA with L sided weakness, anemia, gout, DM, CKD, dizziness, liver cirrhosis, MDD, HTN.  ? ?Clinical Impression ?  ?PTA patient reports independent with ADLs, assist with IADLs. Admitted for above and presents with problem list below, including weakness, lightheaded/dizzy with OOB activity, impaired balance and decreased safety awareness.  Patient currently requires min assist for bed mobility, up to mod assist for ADLs and mod assist for transfer into standing briefly. BP monitored throughout session, see below.  Educated on adjusting HOB to more upright positioning throughout the day, pt voiced understanding. Believe she will benefit from SNF level rehab after dc to optimize activity tolerance and safety prior to return home.  OT will follow acutely.  ? ?BP supine: 122/67 HR 62 ? HOB 60* supine: 113/66 HR 63 ?Sitting EOB: 118/88 HR 72  ?Supine after standing briefly (unable to get standing or sitting pressure as symptomatic): 102/52 HR 67 ?   ? ?Recommendations for follow up therapy are one component of a multi-disciplinary discharge planning process, led by the attending physician.  Recommendations may be updated based on patient status, additional functional criteria and insurance authorization.  ? ?Follow Up Recommendations ? Skilled nursing-short term rehab (<3 hours/day)  ?  ?Assistance Recommended at Discharge Frequent or constant Supervision/Assistance  ?Patient can return home with the following A lot of help with walking and/or transfers;A lot of help with bathing/dressing/bathroom;Assist for transportation;Help with stairs or ramp for entrance;Assistance with cooking/housework ? ?  ?Functional Status  Assessment ? Patient has had a recent decline in their functional status and demonstrates the ability to make significant improvements in function in a reasonable and predictable amount of time.  ?Equipment Recommendations ? None recommended by OT  ?  ?Recommendations for Other Services   ? ? ?  ?Precautions / Restrictions Precautions ?Precautions: Fall ?Precaution Comments: Watch BP; 2 falls since discharge ?Restrictions ?Weight Bearing Restrictions: No  ? ?  ? ?Mobility Bed Mobility ?Overal bed mobility: Needs Assistance ?Bed Mobility: Supine to Sit, Sit to Supine ?  ?  ?Supine to sit: Min guard ?Sit to supine: Min assist ?  ?General bed mobility comments: min guard to ascend from supine, min assist to reposition back to bed ?  ? ?Transfers ?  ?  ?  ?  ?  ?  ?  ?  ?  ?  ?  ? ?  ?Balance Overall balance assessment: Needs assistance ?Sitting-balance support: No upper extremity supported, Feet supported ?Sitting balance-Leahy Scale: Fair ?Sitting balance - Comments: min guard for safety ?  ?Standing balance support: Bilateral upper extremity supported, During functional activity ?Standing balance-Leahy Scale: Poor ?Standing balance comment: relies on BUE and external support, only brief stand due to lightheaded ?  ?  ?  ?  ?  ?  ?  ?  ?  ?  ?  ?   ? ?ADL either performed or assessed with clinical judgement  ? ?ADL Overall ADL's : Needs assistance/impaired ?  ?  ?Grooming: Brushing hair;Min guard;Sitting ?  ?  ?  ?  ?  ?Upper Body Dressing : Min guard;Sitting ?  ?Lower Body Dressing: Moderate assistance;Sit to/from stand ?Lower Body Dressing Details (indicate cue type and reason): assist to manage socks, mod assist  sit to stand briefly ?  ?Toilet Transfer Details (indicate cue type and reason): min assist side stepping to [facility], simulated toilet transfer ?  ?  ?  ?  ?Functional mobility during ADLs: Moderate assistance;Cueing for safety ?General ADL Comments: limited due to weakness, lightheaded for breif stand   ? ? ? ?Vision   ?Vision Assessment?: No apparent visual deficits  ?   ?Perception   ?  ?Praxis   ?  ? ?Pertinent Vitals/Pain Pain Assessment ?Pain Assessment: No/denies pain  ? ? ? ?Hand Dominance Left ?  ?Extremity/Trunk Assessment Upper Extremity Assessment ?Upper Extremity Assessment: Generalized weakness ?  ?Lower Extremity Assessment ?Lower Extremity Assessment: Defer to PT evaluation ?  ?Cervical / Trunk Assessment ?Cervical / Trunk Assessment: Kyphotic ?  ?Communication Communication ?Communication: No difficulties ?  ?Cognition Arousal/Alertness: Awake/alert ?Behavior During Therapy: [facility] for tasks assessed/performed ?Overall Cognitive Status: Impaired/Different from baseline ?Area of Impairment: Safety/judgement ?  ?  ?  ?  ?  ?  ?  ?  ?  ?  ?  ?  ?Safety/Judgement: Decreased awareness of deficits, Decreased awareness of safety ?  ?  ?General Comments: decreased safety awareness ?  ?  ?General Comments  BP monitored during session- see clinical impression ? ?  ?Exercises   ?  ?Shoulder Instructions    ? ? ?Home Living Family/patient expects to be discharged to:: Private residence ?Living Arrangements: Alone ?Available Help at Discharge: Family;Available PRN/intermittently ?Type of Home: House ?Home Access: Stairs to enter ?Entrance Stairs-Number of Steps: 1 ?Entrance Stairs-Rails: None ?Home Layout: One level ?  ?  ?Bathroom Shower/Tub: Gaffer;Tub only ?  ?Bathroom Toilet: Standard ?  ?  ?Home Equipment: Kasandra Knudsen - single point;Shower [place] (2 wheels) ?  ?  ?  ? ?  ?Prior Functioning/Environment Prior Level of Function : Needs assist ?  ?  ?  ?  ?  ?  ?Mobility Comments: Reports falls since discharge from hospital. Has been using RW. ?ADLs Comments: has been managing ADLs, but needing assist for IADLs from daughter. Has not been driving since last admission. ?  ? ?  ?  ?OT Problem List: Decreased activity tolerance;Decreased strength;Impaired balance (sitting and/or standing);Decreased  safety awareness;Decreased knowledge of use of DME or AE;Decreased knowledge of precautions;Cardiopulmonary status limiting activity ?  ?   ?OT Treatment/Interventions: Self-care/ADL training;Therapeutic exercise;DME and/or AE instruction;Therapeutic activities;Balance training;Patient/family education  ?  ?OT Goals(Current goals can be found in the care plan section) Acute Rehab OT Goals ?Patient Stated Goal: go to rehab ?OT Goal Formulation: With patient ?Time For Goal Achievement: 02/13/22 ?Potential to Achieve Goals: Good  ?OT Frequency: Min 2X/week ?  ? ?Co-evaluation   ?  ?  ?  ?  ? ?  ?AM-PAC OT "6 Clicks" Daily Activity     ?Outcome Measure Help from another person eating meals?: None ?Help from another person taking care of personal grooming?: A Little ?Help from another person toileting, which includes using toliet, bedpan, or urinal?: A Lot ?Help from another person bathing (including washing, rinsing, drying)?: A Lot ?Help from another person to put on and taking off regular upper body clothing?: A Little ?Help from another person to put on and taking off regular lower body clothing?: A Lot ?6 Click Score: 16 ?  ?End of Session Nurse Communication: Mobility status ? ?Activity Tolerance: Treatment limited secondary to medical complications (Comment) (lightheaded, hypotension) ?Patient left: in bed;with call bell/phone within reach;with bed alarm set ? ?OT Visit Diagnosis: Unsteadiness on feet (  R26.81);History of falling (Z91.81);Dizziness and giddiness (R42)  ?              ?Time: 5465-6812 ?OT Time Calculation (min): 18 min ?Charges:  OT General Charges ?$OT Visit: 1 Visit ?OT Evaluation ?$OT Eval Moderate Complexity: 1 Mod ? ?Jolaine Artist, OT ?Acute Rehabilitation Services ?Pager [phone] ?Office [phone] ? ? ?Delight Stare ?01/30/2022, 9:13 AM ?

## 2022-01-30 NOTE — Progress Notes (Signed)
?PROGRESS NOTE ? ? ? ?Diane Pruitt  MPN:[id number] DOB: 1951/08/05 DOA: 01/29/2022 ?PCP: [name], MD  ? ? 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  Patient was recently admitted to the hospital with upper GI bleeding, received 2 units of PRBC with discharge hemoglobin 8.7.  Recent upper GI endoscopy 3/10 with hypertensive gastropathy.  Followed by GI as outpatient. ?In the emergency room she was found with AKI and significant orthostatic and unable to mobilize so admitted to the hospital. ? ? ?Assessment & Plan: ?  ?Orthostatic hypotension with weakness and fall, deconditioning and debility.  Ongoing use of diuretics. ?Patient received IV fluid bolus in the emergency room.  Holding further fluid because of ascites.  Midodrine 5 mg 3 times daily.  Blood pressure stabilizing.  We will give IV albumin to improve her intravascular volume status.  Orthostatic precautions.  Mobility with PT OT and nursing staff.  May need a SNF placement. ? ?Acute renal failure superimposed on chronic kidney disease stage IIIa: Baseline creatinine 1.3-1.4.  Probably related to hypotension and intravascular volume depletion. ?Renal function is stabilizing.  IV albumin today.  Midodrine. ? 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 ?Compensated.  LFTs elevated but is stable.  Holding Lasix and spironolactone.  No evidence of SBP.  GI following.  Holding paracentesis due to low blood pressures.  INR is normal.  Hemoglobin is stable. ? ?Paroxysmal A-fib: In sinus rhythm.  Continue amiodarone.  Eliquis on hold with recent blood loss anemia. ? ?Hypothyroidism: Continue  Synthroid. ? ?Hyperlipidemia: Hold Crestor and Zetia given transaminases more than 3 times normal. ? ?Depression: On Zoloft.  Continue. ? ? ?DVT prophylaxis: SCDs Start: 01/29/22 1330 ?Place TED hose Start: 01/29/22 1330 ? ? ?Code Status: Full code ?Family Communication: None ?Disposition Plan: Status is: Observation ?The patient will require care spanning > 2 midnights and should be moved to inpatient because: Significant drop in blood pressures, orthostatic symptoms, unable to mobilize. ?  ? ? ?Consultants:  ?Gastroenterology ? ?Procedures:  ?None ? ?Antimicrobials:  ?None ? ? ?Subjective: ?Patient seen and examined.  At rest she has no complaints .  Denies any nausea vomiting chest pain or abdominal pain.  Belly is bloated but not like last time.  Could not walk safely because of drop in blood pressure. ? ?Objective: ?Vitals:  ? 01/30/22 0800 01/30/22 0805 01/30/22 0900 01/30/22 0945  ?BP: (!) 82/51 (!) 82/51 (!) 102/52 (!) 104/59  ?Pulse: 60 61 68 67  ?Resp: 12 14 (!) 33 18  ?Temp:  98 ?F (36.7 ?C)    ?TempSrc:  Oral    ?SpO2: 93% 97% 96% 95%  ? ? ?Intake/Output Summary (Last 24 hours) at 01/30/2022 1214 ?Last data filed at 01/30/2022 [phone] ?Gross per 24 hour  ?Intake --  ?Output 400 ml  ?Net -400 ml  ? ?There were no vitals filed for this visit. ? ?Examination: ? ?General exam: Appears calm and comfortable  ?Not in any distress.  Looks comfortable at rest.  Anxious on mobility. ?Respiratory system: Clear to auscultation. Respiratory effort normal. ?Cardiovascular system: S1 & S2 heard, RRR. No JVD, murmurs, rubs, gallops or clicks. No pedal edema. ?Gastrointestinal system: Soft.  Nontender.  Distended.   ?Central nervous system: Alert and oriented. No focal  neurological deficits. ?Extremities: Symmetric 5 x 5 power. ?Skin: No rashes, lesions or ulcers ?Psychiatry: Judgement and insight appear normal. Mood & affect appropriate.  ? ? ? ?Data Reviewed: I have personally reviewed following labs and imaging  studies ? ?CBC: ?Recent Labs  ?Lab 01/24/22 ?0302 01/25/22 ?2774 01/25/22 ?1625 01/26/22 ?0222 01/29/22 ?1287 01/30/22 ?0118  ?WBC 7.1 6.5  --  5.7 8.5 7.5  ?NEUTROABS  --   --   --   --  6.5  --   ?HGB 7.8* 7.4* 9.7* 8.7* 10.7* 9.9*  ?HCT 26.1* 24.5* 31.2* 27.8* 36.5 32.5*  ?MCV 85.9 85.7  --  85.5 89.2 85.8  ?PLT 158 154  --  143* 156 145*  ? ?Basic Metabolic Panel: ?Recent Labs  ?Lab 01/24/22 ?0302 01/25/22 ?8676 01/29/22 ?0350 01/30/22 ?0118  ?NA 137 136 130* 137  ?K 5.3* 4.1 4.2 4.4  ?CL 110 108 104 110  ?CO2 19* 21* 18* 20*  ?GLUCOSE 76 101* 101* 80  ?BUN '20 17 20 19  '$ ?CREATININE 1.56* 1.45* 1.76* 1.62*  ?CALCIUM 8.3* 7.9* 8.0* 8.0*  ?MG 1.9  --   --   --   ? ?GFR: ?Estimated Creatinine Clearance: 30.7 mL/min (A) (by C-G formula based on SCr of 1.62 mg/dL (H)). ?Liver Function Tests: ?Recent Labs  ?Lab 01/24/22 ?0302 01/29/22 ?0350  ?AST 107* 426*  ?ALT 44 205*  ?ALKPHOS 62 86  ?BILITOT 0.9 0.8  ?PROT 5.5* 6.2*  ?ALBUMIN 2.4* 2.4*  ? ?No results for input(s): LIPASE, AMYLASE in the last 168 hours. ?No results for input(s): AMMONIA in the last 168 hours. ?Coagulation Profile: ?Recent Labs  ?Lab 01/29/22 ?0350  ?INR 1.2  ? ?Cardiac Enzymes: ?No results for input(s): CKTOTAL, CKMB, CKMBINDEX, TROPONINI in the last 168 hours. ?BNP (last 3 results) ?No results for input(s): PROBNP in the last 8760 hours. ?HbA1C: ?No results for input(s): HGBA1C in the last 72 hours. ?CBG: ?No results for input(s): GLUCAP in the last 168 hours. ?Lipid Profile: ?No results for input(s): CHOL, HDL, LDLCALC, TRIG, CHOLHDL, LDLDIRECT in the last 72 hours. ?Thyroid Function Tests: ?Recent Labs  ?  01/30/22 ?0118  ?TSH 72.094*  ? ?Anemia Panel: ?No results for input(s): VITAMINB12, FOLATE, FERRITIN, TIBC, IRON, RETICCTPCT in the last 72 hours. ?Sepsis Labs: ?No results for input(s): PROCALCITON, LATICACIDVEN in the last 168 hours. ? ?No results found for this or any previous visit (from the past 240 hour(s)).  ? ? ? ? ? ?Radiology  Studies: ?CT ABDOMEN PELVIS WO CONTRAST ? ?Result Date: 01/29/2022 ?CLINICAL DATA:  71 year old female status post fall. Recently discontinued Eliquis. History of cirrhosis, ascites. Status post paracentesis on 01/04/2022. EXAM: CT ABDOMEN AND PELVIS WITHOUT CONTRAST TECHNIQUE: Multidetector CT imaging of the abdomen and pelvis was performed following the standard protocol without IV contrast. RADIATION DOSE REDUCTION: This exam was performed according to the departmental dose-optimization program which includes automated exposure control, adjustment of the mA and/or kV according to patient size and/or use of iterative reconstruction technique. COMPARISON:  CT Abdomen and Pelvis 01/03/2022 and earlier. FINDINGS: Lower chest: Stable mild to moderate cardiomegaly. No pericardial or pleural effusion. Stable mild lung base scarring and atelectasis. Hepatobiliary: Perihepatic ascites is ongoing and appears mildly increased from last month. Simple fluid density. Nodular, cirrhotic liver. No discrete liver lesion by noncontrast CT. Gallbladder is distended and difficult to delineate from adjacent ascites, similar to the appearance last month. Pancreas: Negative noncontrast pancreas. Spleen: Stable mild splenomegaly.  Adjacent ascites. Adrenals/Urinary Tract: Normal adrenal glands.  Nonobstructed kidneys. Moderate volume bladder with adjacent ascites. Numerous pelvic phleboliths. Stomach/Bowel: No dilated large or small bowel. Abundant ascites. No discrete bowel inflammation is evident. No free air. Vascular/Lymphatic: Extensive Aortoiliac calcified atherosclerosis. Vascular patency is not evaluated in the absence of IV contrast. Normal caliber abdominal aorta. No lymphadenopathy identified. Reproductive: Negative. Other: Large volume simple fluid density ascites in the pelvis, slightly increased from last month. Musculoskeletal: Chronic left lateral 11th rib fracture. Previously augmented L2 compression fracture. No acute  osseous abnormality identified. IMPRESSION: 1. Cirrhosis with moderate volume Ascites which appears mildly progressed compared to the pre-paracentesis CT on 01/03/2022. 2. No other acute finding in the noncontrast abdomen and pelvis. [place]str

## 2022-01-31 DIAGNOSIS — I951 Orthostatic hypotension: Secondary | ICD-10-CM | POA: Diagnosis not present

## 2022-01-31 LAB — CBC WITH DIFFERENTIAL/PLATELET
Abs Immature Granulocytes: 0.02 10*3/uL (ref 0.00–0.07)
Basophils Absolute: 0 10*3/uL (ref 0.0–0.1)
Basophils Relative: 0 %
Eosinophils Absolute: 0.1 10*3/uL (ref 0.0–0.5)
Eosinophils Relative: 2 %
HCT: 29.4 % — ABNORMAL LOW (ref 36.0–46.0)
Hemoglobin: 9.1 g/dL — ABNORMAL LOW (ref 12.0–15.0)
Immature Granulocytes: 0 %
Lymphocytes Relative: 21 %
Lymphs Abs: 1.3 10*3/uL (ref 0.7–4.0)
MCH: 26.5 pg (ref 26.0–34.0)
MCHC: 31 g/dL (ref 30.0–36.0)
MCV: 85.5 fL (ref 80.0–100.0)
Monocytes Absolute: 0.5 10*3/uL (ref 0.1–1.0)
Monocytes Relative: 8 %
Neutro Abs: 4.1 10*3/uL (ref 1.7–7.7)
Neutrophils Relative %: 69 %
Platelets: 143 10*3/uL — ABNORMAL LOW (ref 150–400)
RBC: 3.44 MIL/uL — ABNORMAL LOW (ref 3.87–5.11)
RDW: 17.9 % — ABNORMAL HIGH (ref 11.5–15.5)
WBC: 6.1 10*3/uL (ref 4.0–10.5)
nRBC: 0 % (ref 0.0–0.2)

## 2022-01-31 LAB — COMPREHENSIVE METABOLIC PANEL
ALT: 135 U/L — ABNORMAL HIGH (ref 0–44)
AST: 245 U/L — ABNORMAL HIGH (ref 15–41)
Albumin: 2.4 g/dL — ABNORMAL LOW (ref 3.5–5.0)
Alkaline Phosphatase: 86 U/L (ref 38–126)
Anion gap: 7 (ref 5–15)
BUN: 18 mg/dL (ref 8–23)
CO2: 20 mmol/L — ABNORMAL LOW (ref 22–32)
Calcium: 8.1 mg/dL — ABNORMAL LOW (ref 8.9–10.3)
Chloride: 110 mmol/L (ref 98–111)
Creatinine, Ser: 1.58 mg/dL — ABNORMAL HIGH (ref 0.44–1.00)
GFR, Estimated: 35 mL/min — ABNORMAL LOW (ref >60)
Glucose, Bld: 76 mg/dL (ref 70–99)
Potassium: 4.4 mmol/L (ref 3.5–5.1)
Sodium: 137 mmol/L (ref 135–145)
Total Bilirubin: 0.6 mg/dL (ref 0.3–1.2)
Total Protein: 5.4 g/dL — ABNORMAL LOW (ref 6.5–8.1)

## 2022-01-31 LAB — PHOSPHORUS: Phosphorus: 2.8 mg/dL (ref 2.5–4.6)

## 2022-01-31 LAB — MAGNESIUM: Magnesium: 2 mg/dL (ref 1.7–2.4)

## 2022-01-31 MED ORDER — ALBUMIN HUMAN 25 % IV SOLN
INTRAVENOUS | Status: AC
Start: 2022-01-31 — End: 2022-01-31
  Filled 2022-01-31: qty 50

## 2022-01-31 NOTE — Progress Notes (Signed)
?PROGRESS NOTE ? ? ? ?Diane Pruitt  VZC:[id number] DOB: 1951/08/05 DOA: 01/29/2022 ?PCP: [name], MD  ? ? 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  Patient was recently admitted to the hospital with upper GI bleeding, received 2 units of PRBC with discharge hemoglobin 8.7.  Recent upper GI endoscopy 3/10 with hypertensive gastropathy.  Followed by GI as outpatient. ?In the emergency room she was found with AKI and significant orthostatic and unable to mobilize so admitted to the hospital. ? ? ?Assessment & Plan: ?  ?Orthostatic hypotension with weakness and fall, deconditioning and debility.  Ongoing use of diuretics. ?Patient received IV fluid bolus in the emergency room.  Holding further fluid because of ascites.  ?-Increase midodrine to 10 mg 3 times daily.  Blood pressure is stabilizing.  ?IV albumin 25 g x 4.  ?PT OT.  Orthostatic precautions. Mobility with PT OT and nursing staff.  SNF placement.  ? ?Acute renal failure superimposed on chronic kidney disease stage IIIa: Baseline creatinine 1.3-1.4.  Probably related to hypotension and intravascular volume depletion. ?Renal function is stabilizing.  Will need close outpatient monitoring. ? 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 ?Compensated.  LFTs elevated but is stable.  Holding Lasix and spironolactone.  No evidence of SBP.  GI following.  Holding paracentesis due to low blood pressures.  INR is normal.  Hemoglobin is stable. ?We will continue to hold Lasix and Aldactone until seen by GI as outpatient.  Currently compensated.  No evidence of external fluid or third spacing. ? ?Paroxysmal A-fib: In  sinus rhythm.  Continue amiodarone.  Eliquis on hold with recent blood loss anemia. ? ?Hypothyroidism: Continue Synthroid.  Recently increased thyroxine doses. ? ?Hyperlipidemia: Hold Crestor and Zetia given transaminases more than 3 times normal. ? ?Depression: On Zoloft.  Continue. ? ? ?DVT prophylaxis: SCDs Start: 01/29/22 1330 ?Place TED hose Start: 01/29/22 1330 ? ? ?Code Status: Full code ?Family Communication: Patient's 2 son at the bedside. ?Disposition Plan: Status is: Inpatient.  Remains inpatient because of unsafe discharge. ?  ? ? ?Consultants:  ?Gastroenterology ? ?Procedures:  ?None ? ?Antimicrobials:  ?None ? ? ?Subjective: ? ?Patient seen and examined.  Denies any nausea or vomiting.  Was walking to the bathroom with a walker.  Her blood pressure dropped from 12-87 systolic and patient felt slight dizziness but no obvious syncopal episode.  No other overnight events. ? ?Objective: ?Vitals:  ? 01/31/22 0600 01/31/22 0802 01/31/22 0910 01/31/22 0915  ?BP:  (!) 94/57 (!) 80/49 109/62  ?Pulse: 62 (!) 59 82 68  ?Resp: '12 12 16 16  '$ ?Temp:  97.8 ?F (36.6 ?C)    ?TempSrc:  Oral    ?SpO2: 94% 93%    ? ? ?Intake/Output Summary (Last 24 hours) at 01/31/2022 1239 ?Last data filed at 01/31/2022 0500 ?Gross per 24 hour  ?Intake 550 ml  ?Output 700 ml  ?Net -150 ml  ? ?There were no vitals filed for this visit. ? ?Examination: ? ?General exam: Appears calm and comfortable.  Chronically sick looking.  On room air. ?Mobilizing with support. ?Respiratory system: Clear to auscultation. Respiratory effort normal. ?Cardiovascular system: S1 & S2 heard, RRR. No JVD, murmurs, rubs, gallops or clicks. No pedal edema. ?Gastrointestinal system: Soft.  Nontender.  Distended.  Bowel sound present. ?Central nervous system: Alert and oriented. No focal neurological deficits. ?Extremities: Symmetric 5 x 5 power. ?Skin: No rashes, lesions or ulcers ?Psychiatry: Judgement and insight appear normal. Mood & affect appropriate.   ? ? ? ?Data Reviewed: I have personally reviewed following labs and imaging studies ? ?CBC: ?Recent Labs  ?Lab 01/25/22 ?0427 01/25/22 ?1625 01/26/22 ?0222 01/29/22 ?0350 01/30/22 ?0118 01/31/22 ?0106  ?WBC 6.5  --  5.7 8.5 7.5 6.1  ?NEUTROABS  --   --   --  6.5  --  4.1  ?HGB 7.4* 9.7* 8.7* 10.7* 9.9* 9.1*  ?HCT 24.5* 31.2* 27.8* 36.5 32.5* 29.4*  ?MCV 85.7  --  85.5 89.2 85.8 85.5  ?PLT 154  --  143* 156 145* 143*  ? ?Basic Metabolic Panel: ?Recent Labs  ?Lab 01/25/22 ?0427 01/29/22 ?0350 01/30/22 ?0118 01/31/22 ?0106  ?NA 136 130* 137 137  ?K 4.1 4.2 4.4 4.4  ?CL 108 104 110 110  ?CO2 21* 18* 20* 20*  ?GLUCOSE 101* 101* 80 76  ?BUN '17 20 19 18  '$ ?CREATININE 1.45* 1.76* 1.62* 1.58*  ?CALCIUM 7.9* 8.0* 8.0* 8.1*  ?MG  --   --   --  2.0  ?PHOS  --   --   --  2.8  ? ?GFR: ?Estimated Creatinine Clearance: 31.5 mL/min (A) (by C-G formula based on SCr of 1.58 mg/dL (H)). ?Liver Function Tests: ?Recent Labs  ?Lab 01/29/22 ?0350 01/31/22 ?0106  ?AST 426* 245*  ?ALT 205* 135*  ?ALKPHOS 86 86  ?BILITOT 0.8 0.6  ?PROT 6.2* 5.4*  ?ALBUMIN 2.4* 2.4*  ? ?No results for input(s): LIPASE, AMYLASE in the last 168 hours. ?No results for input(s): AMMONIA in the last 168 hours. ?Coagulation Profile: ?Recent Labs  ?Lab 01/29/22 ?0350  ?INR 1.2  ? ?Cardiac Enzymes: ?No results for input(s): CKTOTAL, CKMB, CKMBINDEX, TROPONINI in the last 168 hours. ?BNP (last 3 results) ?No results for input(s): PROBNP in the last 8760 hours. ?HbA1C: ?No results for input(s): HGBA1C in the last 72 hours. ?CBG: ?No results for input(s): GLUCAP in the last 168 hours. ?Lipid Profile: ?No results for input(s): CHOL, HDL, LDLCALC, TRIG, CHOLHDL, LDLDIRECT in the last 72 hours. ?Thyroid Function Tests: ?Recent Labs  ?  01/30/22 ?0118  ?TSH 93.570*  ? ?Anemia Panel: ?No results for input(s): VITAMINB12, FOLATE, FERRITIN, TIBC, IRON, RETICCTPCT in the last 72 hours. ?Sepsis Labs: ?No results for input(s): PROCALCITON, LATICACIDVEN in the last 168 hours. ? ?No  results found for this or any previous visit (from the past 240 hour(s)).  ? ? ? ? ? ?Radiology Studies: ?No results found. ? ? ? ? ? ?Scheduled Meds: ? allopurinol  200 mg Oral Daily  ? amiodarone  200 mg Oral BID  ? bacitracin   Topical BID  ? cholecalciferol  2,000 Units Oral Daily  ? feeding supplement  237 mL Oral BID BM  ? iron polysaccharides  150 mg Oral Daily  ? levothyroxine  200 mcg Oral q morning  ? midodrine  10 mg Oral TID WC  ? multivitamin  1 tablet Oral Daily  ? multivitamin with minerals  1 tablet Oral Daily  ? pantoprazole  40 mg Oral BID AC  ? sertraline  50 mg Oral Daily  ? ?Continuous Infusions: ? albumin human    ? ? ? LOS: 1 day  ? ? ?Time spent: 35 minutes ? ? ? ?Barb Merino, MD ?[facility] ?Pager [phone] ? ?

## 2022-02-01 DIAGNOSIS — E559 Vitamin D deficiency, unspecified: Secondary | ICD-10-CM | POA: Diagnosis not present

## 2022-02-01 DIAGNOSIS — N1831 Chronic kidney disease, stage 3a: Secondary | ICD-10-CM | POA: Diagnosis not present

## 2022-02-01 DIAGNOSIS — R110 Nausea: Secondary | ICD-10-CM | POA: Diagnosis not present

## 2022-02-01 DIAGNOSIS — F4323 Adjustment disorder with mixed anxiety and depressed mood: Secondary | ICD-10-CM | POA: Diagnosis not present

## 2022-02-01 DIAGNOSIS — R0602 Shortness of breath: Secondary | ICD-10-CM | POA: Diagnosis not present

## 2022-02-01 DIAGNOSIS — J9811 Atelectasis: Secondary | ICD-10-CM | POA: Diagnosis not present

## 2022-02-01 DIAGNOSIS — D509 Iron deficiency anemia, unspecified: Secondary | ICD-10-CM | POA: Diagnosis not present

## 2022-02-01 DIAGNOSIS — R42 Dizziness and giddiness: Secondary | ICD-10-CM | POA: Diagnosis not present

## 2022-02-01 DIAGNOSIS — R197 Diarrhea, unspecified: Secondary | ICD-10-CM | POA: Diagnosis not present

## 2022-02-01 DIAGNOSIS — I951 Orthostatic hypotension: Secondary | ICD-10-CM | POA: Diagnosis not present

## 2022-02-01 DIAGNOSIS — I10 Essential (primary) hypertension: Secondary | ICD-10-CM | POA: Diagnosis not present

## 2022-02-01 DIAGNOSIS — I959 Hypotension, unspecified: Secondary | ICD-10-CM | POA: Diagnosis not present

## 2022-02-01 DIAGNOSIS — M6281 Muscle weakness (generalized): Secondary | ICD-10-CM | POA: Diagnosis not present

## 2022-02-01 DIAGNOSIS — Z79899 Other long term (current) drug therapy: Secondary | ICD-10-CM | POA: Diagnosis not present

## 2022-02-01 DIAGNOSIS — M810 Age-related osteoporosis without current pathological fracture: Secondary | ICD-10-CM | POA: Diagnosis not present

## 2022-02-01 DIAGNOSIS — M109 Gout, unspecified: Secondary | ICD-10-CM | POA: Diagnosis not present

## 2022-02-01 DIAGNOSIS — E039 Hypothyroidism, unspecified: Secondary | ICD-10-CM | POA: Diagnosis not present

## 2022-02-01 DIAGNOSIS — Z7901 Long term (current) use of anticoagulants: Secondary | ICD-10-CM | POA: Diagnosis not present

## 2022-02-01 DIAGNOSIS — R188 Other ascites: Secondary | ICD-10-CM | POA: Diagnosis not present

## 2022-02-01 DIAGNOSIS — K7290 Hepatic failure, unspecified without coma: Secondary | ICD-10-CM | POA: Diagnosis not present

## 2022-02-01 DIAGNOSIS — K7581 Nonalcoholic steatohepatitis (NASH): Secondary | ICD-10-CM | POA: Diagnosis not present

## 2022-02-01 DIAGNOSIS — R531 Weakness: Secondary | ICD-10-CM | POA: Diagnosis not present

## 2022-02-01 DIAGNOSIS — R69 Illness, unspecified: Secondary | ICD-10-CM | POA: Diagnosis not present

## 2022-02-01 DIAGNOSIS — I639 Cerebral infarction, unspecified: Secondary | ICD-10-CM | POA: Diagnosis not present

## 2022-02-01 DIAGNOSIS — D500 Iron deficiency anemia secondary to blood loss (chronic): Secondary | ICD-10-CM | POA: Diagnosis not present

## 2022-02-01 DIAGNOSIS — K7460 Unspecified cirrhosis of liver: Secondary | ICD-10-CM | POA: Diagnosis not present

## 2022-02-01 LAB — CBC WITH DIFFERENTIAL/PLATELET
Abs Immature Granulocytes: 0.02 10*3/uL (ref 0.00–0.07)
Basophils Absolute: 0.1 10*3/uL (ref 0.0–0.1)
Basophils Relative: 1 %
Eosinophils Absolute: 0.1 10*3/uL (ref 0.0–0.5)
Eosinophils Relative: 2 %
HCT: 29.6 % — ABNORMAL LOW (ref 36.0–46.0)
Hemoglobin: 8.7 g/dL — ABNORMAL LOW (ref 12.0–15.0)
Immature Granulocytes: 0 %
Lymphocytes Relative: 21 %
Lymphs Abs: 1.3 10*3/uL (ref 0.7–4.0)
MCH: 25.6 pg — ABNORMAL LOW (ref 26.0–34.0)
MCHC: 29.4 g/dL — ABNORMAL LOW (ref 30.0–36.0)
MCV: 87.1 fL (ref 80.0–100.0)
Monocytes Absolute: 0.5 10*3/uL (ref 0.1–1.0)
Monocytes Relative: 8 %
Neutro Abs: 4.4 10*3/uL (ref 1.7–7.7)
Neutrophils Relative %: 68 %
Platelets: 121 10*3/uL — ABNORMAL LOW (ref 150–400)
RBC: 3.4 MIL/uL — ABNORMAL LOW (ref 3.87–5.11)
RDW: 18 % — ABNORMAL HIGH (ref 11.5–15.5)
WBC: 6.5 10*3/uL (ref 4.0–10.5)
nRBC: 0 % (ref 0.0–0.2)

## 2022-02-01 LAB — COMPREHENSIVE METABOLIC PANEL
ALT: 104 U/L — ABNORMAL HIGH (ref 0–44)
AST: 175 U/L — ABNORMAL HIGH (ref 15–41)
Albumin: 2.5 g/dL — ABNORMAL LOW (ref 3.5–5.0)
Alkaline Phosphatase: 83 U/L (ref 38–126)
Anion gap: 6 (ref 5–15)
BUN: 18 mg/dL (ref 8–23)
CO2: 22 mmol/L (ref 22–32)
Calcium: 8.3 mg/dL — ABNORMAL LOW (ref 8.9–10.3)
Chloride: 111 mmol/L (ref 98–111)
Creatinine, Ser: 1.49 mg/dL — ABNORMAL HIGH (ref 0.44–1.00)
GFR, Estimated: 38 mL/min — ABNORMAL LOW (ref >60)
Glucose, Bld: 98 mg/dL (ref 70–99)
Potassium: 4.6 mmol/L (ref 3.5–5.1)
Sodium: 139 mmol/L (ref 135–145)
Total Bilirubin: 0.5 mg/dL (ref 0.3–1.2)
Total Protein: 5.4 g/dL — ABNORMAL LOW (ref 6.5–8.1)

## 2022-02-01 MED ORDER — MIDODRINE HCL 10 MG PO TABS: 10.0000 mg | ORAL_TABLET | Freq: Three times a day (TID) | ORAL | Status: DC

## 2022-02-01 NOTE — TOC Transition Note (Signed)
Transition of Care (TOC) - CM/SW Discharge Note ? ? ?Patient Details  ?Name: Diane Pruitt ?MRN: [id number] ?Date of Birth: 1951/01/23 ? ?Transition of Care (TOC) CM/SW Contact:  ?Benard Halsted, LCSW ?Phone Number: ?02/01/2022, 11:17 AM ? ? ?Clinical Narrative:    ?Patient will DC to: [facility] ?Anticipated DC date: 02/01/22 ?Family notified: Son at bedside, Delfino Lovett ?Transport by: Delfino Lovett via car ? ? ?Per MD patient ready for DC to [facility]. RN to call report prior to discharge [phone] Room 300). RN, patient, patient's family, and facility notified of DC. Discharge Summary and FL2 sent to facility. DC packet on chart. Ambulance transport requested for patient.  ? ?CSW will sign off for now as social work intervention is no longer needed. Please consult [place] again if new needs arise. ? ? ? ? ?Final next level of care: [facility] ?Barriers to Discharge: Barriers Resolved ? ? ?Patient Goals and CMS Choice ?Patient states their goals for this hospitalization and ongoing recovery are:: Rehab ?CMS Medicare.gov Compare Post Acute Care list provided to:: Patient ?Choice offered to / list presented to : Patient, Adult Children ? ?Discharge Placement ?  ?Existing PASRR number confirmed : 02/01/22          ?Patient chooses bed at: [facility] ?Patient to be transferred to facility by: Car ?Name of family member notified: Delfino Lovett, son ?Patient and family notified of of transfer: 02/01/22 ? ?Discharge Plan and Services ?In-house Referral: Clinical Social Work ?  ?Post Acute Care Choice: [facility]          ?  ?  ?  ?  ?  ?  ?  ?  ?  ?  ? ?Social Determinants of Health (SDOH) Interventions ?  ? ? ?Readmission Risk Interventions ?   ? View : No data to display.  ?  ?  ?  ? ? ? ? ? ?

## 2022-02-01 NOTE — Care Management Important Message (Signed)
Important Message ? ?Patient Details  ?Name: Diane Pruitt ?MRN: [id number] ?Date of Birth: November 23, 1950 ? ? ?Medicare Important Message Given:  Yes ? ? ? ? ?Niang Mitcheltree ?02/01/2022, 2:02 PM ?

## 2022-02-01 NOTE — Discharge Summary (Signed)
Physician Discharge Summary  ?Diane Pruitt ONG:[id number] DOB: 03-18-1951 DOA: 01/29/2022 ? ?PCP: [name], MD ? ?Admit date: 01/29/2022 ?Discharge date: 02/01/2022 ? ?Admitted From: Home ?Disposition: Skilled nursing facility ? ?Recommendations for Outpatient Follow-up:  ?Follow up with PCP in 1-2 weeks ?Please obtain CMP/CBC/magnesium and phosphorus in one week ?Schedule follow-up with GI, office will schedule follow-up ? ?Home Health: N/A ?Equipment/Devices: N/A ? ?Discharge Condition: Stable ?CODE STATUS: Full code ?Diet recommendation: Regular diet ? 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  Patient was recently admitted to the hospital with upper GI bleeding, received 2 units of PRBC with discharge hemoglobin 8.7.  Recent upper GI endoscopy 3/10 with hypertensive gastropathy.  Followed by GI as outpatient. ?In the emergency room she was found with AKI and significant orthostatic and unable to mobilize so admitted to the hospital. ?  ?  ?Assessment & plan of care: ?  ?Orthostatic hypotension with weakness and fall, deconditioning and debility.  Ongoing use of diuretics. ?Patient received IV fluid bolus in the emergency room.  Holding further fluid because of ascites.  ?-Increase midodrine to 10 mg 3 times daily.  Blood pressure is stabilizing.  ?IV albumin 25 g x 4.  ?PT OT.  Orthostatic precautions. Mobility with PT OT and nursing staff.  SNF placement.  ?Symptoms improved with intervention. ?  ?Acute renal failure superimposed on chronic kidney disease stage IIIa: Baseline creatinine 1.3-1.4.  Probably related to hypotension and intravascular volume depletion. ?Renal function is  stabilizing.  Will need close outpatient monitoring.  Creatinine 1.49 today. ?  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 ?Compensated.  LFTs elevated but stable.  Holding Lasix and spironolactone.  No evidence of SBP.  GI following.  Holding paracentesis due to low blood pressures.  INR is normal.  Hemoglobin is stable. ?We will continue to hold Lasix and Aldactone until seen by GI as outpatient.  Currently compensated.  No evidence of  third spacing. ?  ?Paroxysmal A-fib: In sinus rhythm.  Continue amiodarone.  Eliquis on hold with recent blood loss anemia.  She will be seen by gastroenterology before resuming anticoagulation. ? ?Hypothyroidism: Continue Synthroid.  Recently increased thyroxine doses.  TSH is 16, however recently increased dose of thyroxine.  Recheck in 1 month. ?  ?Hyperlipidemia: Hold Crestor and Zetia given transaminases more than 3 times normal. ?  ?Depression: On Zoloft.  Continue. ? ?Stable to transfer to skilled level of care. ?  ?  ? ?Discharge Diagnoses:  ?Principal Problem: ?  Orthostatic hypotension with weakness and falls  ?Active Problems: ?  Acute renal failure superimposed on stage 3a chronic kidney disease ([name]) ?  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  Elevated LFTs ?  Hyponatremia ?  Iron deficiency anemia due to chronic blood loss ?  Paroxysmal atrial fibrillation (HCC) ?  Acquired hypothyroidism ?  Hyperlipidemia ?  Depression ?  Weakness ?  Orthostatic hypotension dysautonomic syndrome ? ? ? ?Discharge Instructions ? ?Discharge Instructions   ? ? Call MD for:  difficulty breathing, headache or visual disturbances   Complete by: As directed ?  ? Call MD for:  persistant nausea and vomiting   Complete by: As directed ?  ? Diet general   Complete by:  As directed ?  ? Increase activity slowly   Complete by: As directed ?  ? No wound care   Complete by: As directed ?  ? ?  ? ?Allergies as of 02/01/2022   ? ?   Reactions  ?  Colchicine Other (See Comments)  ? Stomach upset  ? ?  ? ?  ?Medication List  ?  ? ?STOP taking these medications   ? ?Eliquis 5 MG Tabs tablet ?Generic drug: apixaban ?  ?ezetimibe 10 MG tablet ?Commonly known as: ZETIA ?  ?furosemide 20 MG tablet ?Commonly known as: LASIX ?  ?rosuvastatin 40 MG tablet ?Commonly known as: CRESTOR ?  ?spironolactone 50 MG tablet ?Commonly known as: ALDACTONE ?  ? ?  ? ?TAKE these medications   ? ?acetaminophen 500 MG tablet ?Commonly known as: TYLENOL ?Take 1,000 mg by mouth every 6 (six) hours as needed for headache or mild pain. ?  ?alendronate 70 MG tablet ?Commonly known as: FOSAMAX ?Take 70 mg by mouth every Monday. ?  ?allopurinol 100 MG tablet ?Commonly known as: ZYLOPRIM ?Take 200 mg by mouth daily. ?  ?amiodarone 200 MG tablet ?Commonly known as: PACERONE ?Take 200 mg by mouth in the morning and at bedtime. ?  ?iron polysaccharides 150 MG capsule ?Commonly known as: NIFEREX ?Take 1 capsule (150 mg total) by mouth daily. ?  ?levothyroxine 200 MCG tablet ?Commonly known as: SYNTHROID ?Take 200 mcg by mouth every morning. ?  ?midodrine 10 MG tablet ?Commonly known as: PROAMATINE ?Take 1 tablet (10 mg total) by mouth 3 (three) times daily with meals. ?What changed:  ?medication strength ?how much to take ?  ?multivitamin tablet ?Take 1 tablet by mouth daily. ?  ?ondansetron 4 MG disintegrating tablet ?Commonly known as: ZOFRAN-ODT ?Take 4 mg by mouth every 4 (four) hours as needed for nausea or vomiting. ?  ?pantoprazole 40 MG tablet ?Commonly known as: Protonix ?Take 1 tablet (40 mg total) by mouth 2 (two) times daily before a meal. ?  ?polyethylene glycol 17 g packet ?Commonly known as: MIRALAX / GLYCOLAX ?Take 17 g by mouth daily as needed for mild constipation. ?  ?PRESERVISION AREDS 2 PO ?Take 1 capsule by mouth daily. ?  ?sertraline 50 MG tablet ?Commonly known as: ZOLOFT ?Take 50 mg by mouth daily. ?  ?Vitamin D 50 MCG (2000 UT) tablet ?Take 2,000 Units by mouth daily. ?   ? ?  ? ? Contact information for follow-up providers   ? ? [facility].   ?Specialty: Emergency Medicine ?Why: As needed ?Contact information: ?[address] ?[id number] mc ?[place] ?[phone] ? ?  ?  ? ?  ?  ? ? Contact information for after-discharge care   ? ? Destination   ? ? [facility] .   ?Service: Skilled Nursing ?Contact information: ?[address] ?[place] ?[phone] ? ?  ?  ? ?  ?  ? ?  ?  ? ?  ? ?Allergies  ?Allergen Reactions  ? Colchicine Other (See Comments)  ?  Stomach upset  ? ? ?Consultations: ?Gastroenterology ? ? ?Procedures/Studies: ?CT ABDOMEN PELVIS WO CONTRAST ? ?Result Date: 01/29/2022 ?CLINICAL DATA:  71 year old female status post fall. Recently discontinued Eliquis. History of cirrhosis, ascites. Status post paracentesis on 01/04/2022. EXAM: CT ABDOMEN AND PELVIS WITHOUT CONTRAST TECHNIQUE: Multidetector CT imaging of the abdomen and pelvis was performed following the standard protocol without IV contrast. RADIATION DOSE REDUCTION:  This exam was performed according to the departmental dose-optimization program which includes automated exposure control, adjustment of the mA and/or kV according to patient size and/or use of iterative reconstruction technique. COMPARISON:  CT Abdomen and Pelvis 01/03/2022 and earlier. FINDINGS: Lower chest: Stable mild to moderate cardiomegaly. No pericardial or pleural effusion. Stable mild lung base scarring and atelectasis. Hepatobiliary: Perihepatic ascites is ongoing and appears mildly increased from last month. Simple fluid density. Nodular, cirrhotic liver. No discrete liver lesion by noncontrast CT. Gallbladder is distended and difficult to delineate from adjacent ascites, similar to the appearance last month. Pancreas: Negative noncontrast pancreas. Spleen: Stable mild splenomegaly.  Adjacent ascites.  Adrenals/Urinary Tract: Normal adrenal glands. Nonobstructed kidneys. Moderate volume bladder with adjacent ascites. Numerous pelvic phleboliths. Stomach/Bowel: No dilated large or small bowel. Abundant ascites. No discrete

## 2022-02-01 NOTE — TOC Progression Note (Addendum)
Transition of Care (TOC) - Progression Note  ? ? ?Patient Details  ?Name: Diane Pruitt ?MRN: [id number] ?Date of Birth: 26-Jul-1951 ? ?Transition of Care (TOC) CM/SW Contact  ?Benard Halsted, LCSW ?Phone Number: ?02/01/2022, 8:48 AM ? ?Clinical Narrative:    ?8:48am-Insurance approval still pending for [facility]. TOC leadership aware. CSW spoke with patient's son, Diane Pruitt, and provided update. ? ?9:35am-Per [facility], they have received insurance approval. CSW updated patient's son Diane Pruitt as requested. He is on his way to the hospital and will be able to transport patient by car. MD and RN aware.  ? ? ?Expected Discharge Plan: [place] ?Barriers to Discharge: Ship broker, Continued Medical Work up ? ?Expected Discharge Plan and Services ?Expected Discharge Plan: [place] ?In-house Referral: Clinical Social Work ?  ?Post Acute Care Choice: [place] ?Living arrangements for the past 2 months: [place] ?                ?  ?  ?  ?  ?  ?  ?  ?  ?  ?  ? ? ?Social Determinants of Health (SDOH) Interventions ?  ? ?Readmission Risk Interventions ?   ? View : No data to display.  ?  ?  ?  ? ? ?

## 2022-02-01 NOTE — Care Management Important Message (Signed)
Important Message ? ?Patient Details  ?Name: Diane Pruitt ?MRN: [id number] ?Date of Birth: 1951/05/07 ? ? ?Medicare Important Message Given:  Yes ? ? ? ? ?Kielyn Kardell ?02/01/2022, 2:03 PM ?

## 2022-02-06 DIAGNOSIS — D509 Iron deficiency anemia, unspecified: Secondary | ICD-10-CM | POA: Diagnosis not present

## 2022-02-06 DIAGNOSIS — R69 Illness, unspecified: Secondary | ICD-10-CM | POA: Diagnosis not present

## 2022-02-06 DIAGNOSIS — E039 Hypothyroidism, unspecified: Secondary | ICD-10-CM | POA: Diagnosis not present

## 2022-02-06 DIAGNOSIS — I959 Hypotension, unspecified: Secondary | ICD-10-CM | POA: Diagnosis not present

## 2022-02-06 DIAGNOSIS — E559 Vitamin D deficiency, unspecified: Secondary | ICD-10-CM | POA: Diagnosis not present

## 2022-02-06 DIAGNOSIS — N1831 Chronic kidney disease, stage 3a: Secondary | ICD-10-CM | POA: Diagnosis not present

## 2022-02-06 DIAGNOSIS — M109 Gout, unspecified: Secondary | ICD-10-CM | POA: Diagnosis not present

## 2022-02-11 ENCOUNTER — Emergency Department (HOSPITAL_COMMUNITY)
Admission: EM | Admit: 2022-02-11 | Discharge: 2022-02-12 | Disposition: A | Discharge status: Home / Self Care | Payer: Medicare HMO | Attending: Emergency Medicine | Admitting: Emergency Medicine

## 2022-02-11 DIAGNOSIS — I10 Essential (primary) hypertension: Secondary | ICD-10-CM | POA: Insufficient documentation

## 2022-02-11 DIAGNOSIS — Z7901 Long term (current) use of anticoagulants: Secondary | ICD-10-CM | POA: Diagnosis not present

## 2022-02-11 DIAGNOSIS — K7460 Unspecified cirrhosis of liver: Secondary | ICD-10-CM | POA: Diagnosis not present

## 2022-02-11 DIAGNOSIS — J9811 Atelectasis: Secondary | ICD-10-CM | POA: Diagnosis not present

## 2022-02-11 DIAGNOSIS — I959 Hypotension, unspecified: Secondary | ICD-10-CM | POA: Insufficient documentation

## 2022-02-11 DIAGNOSIS — E039 Hypothyroidism, unspecified: Secondary | ICD-10-CM | POA: Insufficient documentation

## 2022-02-11 DIAGNOSIS — R531 Weakness: Secondary | ICD-10-CM | POA: Diagnosis not present

## 2022-02-11 DIAGNOSIS — Z79899 Other long term (current) drug therapy: Secondary | ICD-10-CM | POA: Diagnosis not present

## 2022-02-11 DIAGNOSIS — R188 Other ascites: Secondary | ICD-10-CM | POA: Insufficient documentation

## 2022-02-11 DIAGNOSIS — R110 Nausea: Secondary | ICD-10-CM | POA: Diagnosis not present

## 2022-02-11 DIAGNOSIS — R0602 Shortness of breath: Secondary | ICD-10-CM | POA: Diagnosis not present

## 2022-02-11 NOTE — ED Provider Triage Note (Signed)
Emergency Medicine Provider Triage Evaluation Note ? ?Diane Pruitt , a 71 y.o. female  was evaluated in triage.  Pt complains of weakness, abdominal swelling, multiple falls in the last few days. Patient has been at rehab and has has been experiencing the symptoms above. H/o cirrhosis, last therapeutic paracentesis about 6 weeks ago. Denies fevers, chills, though does have some nasuea. Denies any head injuries or pains associated w/ falls.  Last BM this morning. No urinary symptoms, hematochezia, hematemesis.  ? ?Review of Systems  ?Positive: As above  ?Negative: As above  ? ?Physical Exam  ?BP 104/66   Pulse 76   Temp 98.9 ?F (37.2 ?C) (Oral)   Resp 18   SpO2 95%  ?Gen:   Awake, no distress, pale appearing    ?Resp:  Normal effort ?MSK:   Moves extremities without difficulty  ?Other:  Abdomen distended, nontender, no CVA tenderness, no cervical spine tenderness, no signs of head trauma.  ? ?Medical Decision Making  ?Medically screening exam initiated at 11:47 PM.  Appropriate orders placed.  Diane Pruitt was informed that the remainder of the evaluation will be completed by another provider, this initial triage assessment does not replace that evaluation, and the importance of remaining in the ED until their evaluation is complete. ? ? ?  ?Diane Balding, PA-C ?02/11/22 2350 ? ?

## 2022-02-12 ENCOUNTER — Encounter (HOSPITAL_COMMUNITY): Payer: Self-pay | Admitting: Emergency Medicine

## 2022-02-12 ENCOUNTER — Emergency Department (HOSPITAL_COMMUNITY): Payer: Medicare HMO

## 2022-02-12 ENCOUNTER — Other Ambulatory Visit: Payer: Self-pay

## 2022-02-12 DIAGNOSIS — K7460 Unspecified cirrhosis of liver: Secondary | ICD-10-CM | POA: Diagnosis not present

## 2022-02-12 DIAGNOSIS — R0602 Shortness of breath: Secondary | ICD-10-CM | POA: Diagnosis not present

## 2022-02-12 DIAGNOSIS — R188 Other ascites: Secondary | ICD-10-CM | POA: Diagnosis not present

## 2022-02-12 DIAGNOSIS — J9811 Atelectasis: Secondary | ICD-10-CM | POA: Diagnosis not present

## 2022-02-12 HISTORY — PX: IR PARACENTESIS: IMG2679

## 2022-02-12 LAB — CBC WITH DIFFERENTIAL/PLATELET
Abs Immature Granulocytes: 0.11 10*3/uL — ABNORMAL HIGH (ref 0.00–0.07)
Basophils Absolute: 0.1 10*3/uL (ref 0.0–0.1)
Basophils Relative: 0 %
Eosinophils Absolute: 0.1 10*3/uL (ref 0.0–0.5)
Eosinophils Relative: 1 %
HCT: 34.3 % — ABNORMAL LOW (ref 36.0–46.0)
Hemoglobin: 10.4 g/dL — ABNORMAL LOW (ref 12.0–15.0)
Immature Granulocytes: 1 %
Lymphocytes Relative: 12 %
Lymphs Abs: 1.5 10*3/uL (ref 0.7–4.0)
MCH: 26.1 pg (ref 26.0–34.0)
MCHC: 30.3 g/dL (ref 30.0–36.0)
MCV: 86 fL (ref 80.0–100.0)
Monocytes Absolute: 0.9 10*3/uL (ref 0.1–1.0)
Monocytes Relative: 8 %
Neutro Abs: 9.3 10*3/uL — ABNORMAL HIGH (ref 1.7–7.7)
Neutrophils Relative %: 78 %
Platelets: 345 10*3/uL (ref 150–400)
RBC: 3.99 MIL/uL (ref 3.87–5.11)
RDW: 19.6 % — ABNORMAL HIGH (ref 11.5–15.5)
WBC: 12 10*3/uL — ABNORMAL HIGH (ref 4.0–10.5)
nRBC: 0 % (ref 0.0–0.2)

## 2022-02-12 LAB — COMPREHENSIVE METABOLIC PANEL
ALT: 76 U/L — ABNORMAL HIGH (ref 0–44)
AST: 165 U/L — ABNORMAL HIGH (ref 15–41)
Albumin: 2.7 g/dL — ABNORMAL LOW (ref 3.5–5.0)
Alkaline Phosphatase: 109 U/L (ref 38–126)
Anion gap: 11 (ref 5–15)
BUN: 20 mg/dL (ref 8–23)
CO2: 16 mmol/L — ABNORMAL LOW (ref 22–32)
Calcium: 8.6 mg/dL — ABNORMAL LOW (ref 8.9–10.3)
Chloride: 106 mmol/L (ref 98–111)
Creatinine, Ser: 1.69 mg/dL — ABNORMAL HIGH (ref 0.44–1.00)
GFR, Estimated: 32 mL/min — ABNORMAL LOW (ref >60)
Glucose, Bld: 107 mg/dL — ABNORMAL HIGH (ref 70–99)
Potassium: 4.9 mmol/L (ref 3.5–5.1)
Sodium: 133 mmol/L — ABNORMAL LOW (ref 135–145)
Total Bilirubin: 1.2 mg/dL (ref 0.3–1.2)
Total Protein: 7.2 g/dL (ref 6.5–8.1)

## 2022-02-12 LAB — TSH: TSH: 13.173 u[IU]/mL — ABNORMAL HIGH (ref 0.350–4.500)

## 2022-02-12 LAB — T4, FREE: Free T4: 1.2 ng/dL — ABNORMAL HIGH (ref 0.61–1.12)

## 2022-02-12 MED ORDER — SODIUM CHLORIDE 0.9 % IV BOLUS
1000.0000 mL | Freq: Once | INTRAVENOUS | Status: AC
  Administered 2022-02-12: 1000 mL via INTRAVENOUS

## 2022-02-12 MED ORDER — LIDOCAINE HCL (PF) 1 % IJ SOLN
INTRAMUSCULAR | Status: DC | PRN
  Administered 2022-02-12: 10 mL

## 2022-02-12 MED ORDER — ALBUMIN HUMAN 25 % IV SOLN
12.5000 g | Freq: Once | INTRAVENOUS | Status: AC
  Administered 2022-02-12: 12.5 g via INTRAVENOUS
  Filled 2022-02-12: qty 50

## 2022-02-12 MED ORDER — LIDOCAINE HCL 1 % IJ SOLN
INTRAMUSCULAR | Status: AC
  Filled 2022-02-12: qty 20

## 2022-02-12 MED ORDER — SODIUM CHLORIDE 0.9 % IV BOLUS
500.0000 mL | Freq: Once | INTRAVENOUS | Status: AC
  Administered 2022-02-12: 500 mL via INTRAVENOUS

## 2022-02-12 NOTE — Procedures (Addendum)
PROCEDURE SUMMARY: ? ?Successful ultrasound guided paracentesis from the right lateral quadrant.  ?Yielded 6.3 liters of clear, yellow fluid.  ?No immediate complications.  ?The patient tolerated the procedure well.  ? ?Specimen was not sent for labs. ? ?EBL < 29m ? ?If the patient eventually requires >/=2 paracenteses in a 30 day period, screening evaluation by the [facility] will be assessed. ? ? ?KBrynda Greathouse MS RD PA-C ? ? ? ? ?

## 2022-02-12 NOTE — ED Notes (Signed)
Pt tolerating fluids and soup without vomiting ?

## 2022-02-12 NOTE — ED Notes (Signed)
Attempted to call report to facility, no answer at this time. ?

## 2022-02-12 NOTE — ED Provider Notes (Signed)
?  Physical Exam  ?BP 106/63   Pulse 70   Temp 97.7 ?F (36.5 ?C) (Oral)   Resp 19   SpO2 96%  ? ?Physical Exam ? ?Procedures  ?Procedures ? ?ED Course / MDM  ?  ?Medical Decision Making ?Care assumed at 3 PM.  Patient is here with abdominal distention.  Patient had recent paracentesis.  Patient went to IR for paracentesis.  Signout pending reassessment ? ?4 pm ?Patient was briefly hypotensive after paracentesis.  I ordered a liter fluids and albumin ? ?6:17 PM ?Blood pressure up to 106/63 now.  Son is at bedside and will drive her back to the facility. ? ?Problems Addressed: ?Cirrhosis of liver with ascites, unspecified hepatic cirrhosis type [facility]): acute illness or injury ? ?Amount and/or Complexity of Data Reviewed ?Labs: ordered. Decision-making details documented in ED Course. ? ?Risk ?Prescription drug management. ? ? ? ? ? ? ? ?  ?Drenda Freeze, MD ?02/12/22 1819 ? ?

## 2022-02-12 NOTE — Discharge Instructions (Signed)
Please see your GI doctors for follow-up of your cirrhosis.  You may need to get scheduled paracentesis ? ?Return to ER if you have worse abdominal pain, fever, vomiting, dehydration ?

## 2022-02-12 NOTE — ED Triage Notes (Signed)
Pt reported  to ED with c/o generalized weakness and abdominal bloating. Pt states she has been having dizziness and shortness of breath with activity.  Has hx of cirrhosis and anemia. Also reports diarrhea x 2 weeks.  ?

## 2022-02-12 NOTE — ED Provider Notes (Signed)
?[facility] ?Provider Note ? ? ?CSN: [id number] ?Arrival date & time: 02/11/22  2254 ? ?  ? ?History ? ?Chief Complaint  ?Patient presents with  ? Weakness  ? ? ?Diane Pruitt is a 71 y.o. female. ? ?HPI ?Patient presents with her daughter who assists with history.  She was seen, evaluated, admitted about 2 weeks ago following a somewhat similar presentation.  She notes that on discharge to a rehabilitation facility she was transiently better, but now over the past few days has had similar recurrence of weakness, diarrhea, with increasing abdominal distention.  No chest pain, no syncope, though she has a fall.  She is taking her medication as directed.  She notes that she had adjustment of her thyroid medication about 2 months ago, otherwise no medication changes. ?  ? ?Home Medications ?Prior to Admission medications   ?Medication Sig Start Date End Date Taking? Authorizing Provider  ?acetaminophen (TYLENOL) 500 MG tablet Take 1,000 mg by mouth every 6 (six) hours as needed for headache or mild pain.   Yes [provider]  ?alendronate (FOSAMAX) 70 MG tablet Take 70 mg by mouth every Monday.   Yes [provider]  ?allopurinol (ZYLOPRIM) 100 MG tablet Take 200 mg by mouth daily. 01/20/20  Yes [provider]  ?amiodarone (PACERONE) 200 MG tablet Take 200 mg by mouth in the morning and at bedtime.  02/27/18  Yes [provider]  ?Cholecalciferol (VITAMIN D) 50 MCG (2000 UT) tablet Take 2,000 Units by mouth daily. 05/03/21  Yes [provider]  ?iron polysaccharides (NIFEREX) 150 MG capsule Take 1 capsule (150 mg total) by mouth daily. 01/27/22  Yes Kathie Dike, MD  ?levothyroxine (SYNTHROID) 200 MCG tablet Take 200 mcg by mouth every morning. 12/26/21  Yes [provider]  ?midodrine (PROAMATINE) 10 MG tablet Take 1 tablet (10 mg total) by mouth 3 (three) times daily with meals. 02/01/22  Yes Barb Merino, MD  ?Multiple  Vitamin (MULTIVITAMIN) tablet Take 1 tablet by mouth daily.   Yes [provider]  ?Multiple Vitamins-Minerals (PRESERVISION AREDS 2 PO) Take 1 capsule by mouth daily. Preservision AREDS-2 250 mg- 90 mg- 40 mg-1 mg   Yes [provider]  ?ondansetron (ZOFRAN-ODT) 4 MG disintegrating tablet Take 4 mg by mouth every 4 (four) hours as needed for nausea or vomiting. 01/27/22  Yes [provider]  ?pantoprazole (PROTONIX) 40 MG tablet Take 1 tablet (40 mg total) by mouth 2 (two) times daily before a meal. 01/26/22 02/25/22 Yes Memon, Jolaine Artist, MD  ?polyethylene glycol (MIRALAX / GLYCOLAX) 17 g packet Take 17 g by mouth daily as needed for mild constipation. 01/26/22  Yes Kathie Dike, MD  ?sertraline (ZOLOFT) 50 MG tablet Take 50 mg by mouth daily. 11/01/21  Yes [provider]  ?   ? ?Allergies    ?Colchicine   ? ?Review of Systems   ?Review of Systems  ?Constitutional:   ?     Per HPI, otherwise negative  ?HENT:    ?     Per HPI, otherwise negative  ?Respiratory:    ?     Per HPI, otherwise negative  ?Cardiovascular:   ?     Per HPI, otherwise negative  ?Gastrointestinal:  Negative for vomiting.  ?Endocrine:  ?     Negative aside from HPI  ?Genitourinary:   ?     Neg aside from HPI   ?Musculoskeletal:   ?     Per HPI,  otherwise negative  ?Skin: Negative.   ?Neurological:  Positive for weakness. Negative for syncope.  ? ?Physical Exam ?Updated Vital Signs ?BP 106/63   Pulse 70   Temp 97.7 ?F (36.5 ?C) (Oral)   Resp 19   SpO2 96%  ?Physical Exam ?Vitals and nursing note reviewed.  ?Constitutional:   ?   General: She is not in acute distress. ?   Appearance: She is well-developed.  ?HENT:  ?   Head: Normocephalic and atraumatic.  ?Eyes:  ?   Conjunctiva/sclera: Conjunctivae normal.  ?Cardiovascular:  ?   Rate and Rhythm: Normal rate and regular rhythm.  ?Pulmonary:  ?   Effort: Pulmonary effort is normal. No respiratory distress.  ?   Breath sounds: Normal breath sounds. No stridor.   ?Abdominal:  ?   General: There is distension.  ?   Tenderness: There is no abdominal tenderness. There is no guarding.  ?Skin: ?   General: Skin is warm and dry.  ?Neurological:  ?   Mental Status: She is alert and oriented to person, place, and time.  ?   Cranial Nerves: No cranial nerve deficit.  ?   Motor: No weakness or atrophy.  ?Psychiatric:     ?   Mood and Affect: Mood normal.     ?   Behavior: Behavior is slowed.  ? ? ?ED Results / Procedures / Treatments   ?Labs ?(all labs ordered are listed, but only abnormal results are displayed) ?Labs Reviewed  ?CBC WITH DIFFERENTIAL/PLATELET - Abnormal; Notable for the following components:  ?    Result Value  ? WBC 12.0 (*)   ? Hemoglobin 10.4 (*)   ? HCT 34.3 (*)   ? RDW 19.6 (*)   ? Neutro Abs 9.3 (*)   ? Abs Immature Granulocytes 0.11 (*)   ? All other components within normal limits  ?COMPREHENSIVE METABOLIC PANEL - Abnormal; Notable for the following components:  ? Sodium 133 (*)   ? CO2 16 (*)   ? Glucose, Bld 107 (*)   ? Creatinine, Ser 1.69 (*)   ? Calcium 8.6 (*)   ? Albumin 2.7 (*)   ? AST 165 (*)   ? ALT 76 (*)   ? GFR, Estimated 32 (*)   ? All other components within normal limits  ?TSH - Abnormal; Notable for the following components:  ? TSH 13.173 (*)   ? All other components within normal limits  ?T4, FREE - Abnormal; Notable for the following components:  ? Free T4 1.20 (*)   ? All other components within normal limits  ? ? ?EKG ?None ? ?Radiology ?DG Chest 2 View ? ?Result Date: 02/12/2022 ?CLINICAL DATA:  Shortness of breath. EXAM: CHEST - 2 VIEW COMPARISON:  None. FINDINGS: Low lung volumes are seen with mild areas of bibasilar atelectasis. There is no evidence of a pleural effusion or pneumothorax. The heart size and mediastinal contours are within normal limits. Marked severity curvilinear cardiac calcification is seen within the medial aspect of the cardiac silhouette. The visualized skeletal structures are unremarkable. IMPRESSION: Low lung  volumes with mild bibasilar atelectasis. Electronically Signed   By: Virgina Norfolk M.D.   On: 02/12/2022 00:29  ? ?IR Paracentesis ? ?Result Date: 02/12/2022 ?INDICATION: Patient with history of cirrhosis, abdominal distention. Request made for therapeutic paracentesis. EXAM: ULTRASOUND GUIDED THERAPEUTIC PARACENTESIS MEDICATIONS: 10 mL 1% lidocaine COMPLICATIONS: None immediate. PROCEDURE: Informed written consent was obtained from the patient after a discussion of the risks, benefits and alternatives  to treatment. A timeout was performed prior to the initiation of the procedure. Initial ultrasound scanning demonstrates a large amount of ascites within the right lower abdominal quadrant. The right lower abdomen was prepped and draped in the usual sterile fashion. 1% lidocaine was used for local anesthesia. Following this, a 19 gauge, 7-cm, Yueh catheter was introduced. An ultrasound image was saved for documentation purposes. The paracentesis was performed. The catheter was removed and a dressing was applied. The patient tolerated the procedure well without immediate post procedural complication. FINDINGS: A total of approximately 6.3 liters of clear, yellow fluid was removed. Samples were sent to the laboratory as requested by the clinical team. IMPRESSION: Successful ultrasound-guided therapeutic paracentesis yielding 6.3 liters of peritoneal fluid. Read by: Brynda Greathouse PA-C Electronically Signed   By: Ruthann Cancer M.D.   On: 02/12/2022 16:01   ? ?Procedures ?Procedures  ? ? ?Medications Ordered in ED ?Medications  ?sodium chloride 0.9 % bolus 500 mL (0 mLs Intravenous Stopped 02/12/22 1618)  ?albumin human 25 % solution 12.5 g (0 g Intravenous Stopped 02/12/22 1725)  ?sodium chloride 0.9 % bolus 1,000 mL (0 mLs Intravenous Stopped 02/12/22 1805)  ? ? ?ED Course/ Medical Decision Making/ A&P ?This patient with a Hx of multiple medical issues including hepatic and renal dysfunction presents to the ED for  concern of weakness, increasing abdominal girth, this involves an extensive number of treatment options, and is a complaint that carries with it a high risk of complications and morbidity.   ? ?The differential diagnosis

## 2022-02-12 NOTE — ED Notes (Signed)
Pt appetite has decreased and the last 48 hours nausea with dry heaves. Pt states abdomen have been larger than usual.  ?

## 2022-02-12 NOTE — ED Notes (Signed)
Pt transported to IR 

## 2022-02-13 DIAGNOSIS — D509 Iron deficiency anemia, unspecified: Secondary | ICD-10-CM | POA: Diagnosis not present

## 2022-02-13 DIAGNOSIS — R188 Other ascites: Secondary | ICD-10-CM | POA: Diagnosis not present

## 2022-02-13 DIAGNOSIS — E039 Hypothyroidism, unspecified: Secondary | ICD-10-CM | POA: Diagnosis not present

## 2022-02-13 DIAGNOSIS — M109 Gout, unspecified: Secondary | ICD-10-CM | POA: Diagnosis not present

## 2022-02-13 DIAGNOSIS — N1831 Chronic kidney disease, stage 3a: Secondary | ICD-10-CM | POA: Diagnosis not present

## 2022-02-13 DIAGNOSIS — I639 Cerebral infarction, unspecified: Secondary | ICD-10-CM | POA: Diagnosis not present

## 2022-02-13 DIAGNOSIS — R69 Illness, unspecified: Secondary | ICD-10-CM | POA: Diagnosis not present

## 2022-02-19 DIAGNOSIS — K7460 Unspecified cirrhosis of liver: Secondary | ICD-10-CM | POA: Diagnosis not present

## 2022-02-19 DIAGNOSIS — I959 Hypotension, unspecified: Secondary | ICD-10-CM | POA: Diagnosis not present

## 2022-02-19 DIAGNOSIS — K7290 Hepatic failure, unspecified without coma: Secondary | ICD-10-CM | POA: Diagnosis not present

## 2022-02-19 DIAGNOSIS — R42 Dizziness and giddiness: Secondary | ICD-10-CM | POA: Diagnosis not present

## 2022-02-19 DIAGNOSIS — R197 Diarrhea, unspecified: Secondary | ICD-10-CM | POA: Diagnosis not present

## 2022-02-19 DIAGNOSIS — R188 Other ascites: Secondary | ICD-10-CM | POA: Diagnosis not present

## 2022-02-19 DIAGNOSIS — R69 Illness, unspecified: Secondary | ICD-10-CM | POA: Diagnosis not present

## 2022-02-20 ENCOUNTER — Emergency Department: Payer: Medicare HMO

## 2022-02-20 ENCOUNTER — Other Ambulatory Visit: Payer: Self-pay

## 2022-02-20 ENCOUNTER — Inpatient Hospital Stay
Admission: EM | Admit: 2022-02-20 | Discharge: 2022-02-23 | DRG: 432 | Disposition: A | Discharge status: Other Acute Inpatient Hospital | Payer: Medicare HMO | Source: Skilled Nursing Facility | Attending: Osteopathic Medicine | Admitting: Osteopathic Medicine

## 2022-02-20 DIAGNOSIS — I951 Orthostatic hypotension: Secondary | ICD-10-CM | POA: Diagnosis not present

## 2022-02-20 DIAGNOSIS — K7689 Other specified diseases of liver: Secondary | ICD-10-CM | POA: Diagnosis not present

## 2022-02-20 DIAGNOSIS — E875 Hyperkalemia: Secondary | ICD-10-CM | POA: Diagnosis not present

## 2022-02-20 DIAGNOSIS — Z7984 Long term (current) use of oral hypoglycemic drugs: Secondary | ICD-10-CM

## 2022-02-20 DIAGNOSIS — A498 Other bacterial infections of unspecified site: Secondary | ICD-10-CM | POA: Diagnosis present

## 2022-02-20 DIAGNOSIS — R609 Edema, unspecified: Secondary | ICD-10-CM | POA: Diagnosis not present

## 2022-02-20 DIAGNOSIS — A0472 Enterocolitis due to Clostridium difficile, not specified as recurrent: Secondary | ICD-10-CM | POA: Diagnosis present

## 2022-02-20 DIAGNOSIS — Z8349 Family history of other endocrine, nutritional and metabolic diseases: Secondary | ICD-10-CM

## 2022-02-20 DIAGNOSIS — Z20822 Contact with and (suspected) exposure to covid-19: Secondary | ICD-10-CM | POA: Diagnosis present

## 2022-02-20 DIAGNOSIS — Z7189 Other specified counseling: Secondary | ICD-10-CM | POA: Diagnosis not present

## 2022-02-20 DIAGNOSIS — Z79899 Other long term (current) drug therapy: Secondary | ICD-10-CM

## 2022-02-20 DIAGNOSIS — I340 Nonrheumatic mitral (valve) insufficiency: Secondary | ICD-10-CM | POA: Diagnosis not present

## 2022-02-20 DIAGNOSIS — D509 Iron deficiency anemia, unspecified: Secondary | ICD-10-CM | POA: Diagnosis present

## 2022-02-20 DIAGNOSIS — I480 Paroxysmal atrial fibrillation: Secondary | ICD-10-CM | POA: Diagnosis not present

## 2022-02-20 DIAGNOSIS — R188 Other ascites: Secondary | ICD-10-CM | POA: Diagnosis not present

## 2022-02-20 DIAGNOSIS — E559 Vitamin D deficiency, unspecified: Secondary | ICD-10-CM | POA: Diagnosis not present

## 2022-02-20 DIAGNOSIS — M1A9XX0 Chronic gout, unspecified, without tophus (tophi): Secondary | ICD-10-CM | POA: Diagnosis not present

## 2022-02-20 DIAGNOSIS — Z515 Encounter for palliative care: Secondary | ICD-10-CM | POA: Diagnosis not present

## 2022-02-20 DIAGNOSIS — N179 Acute kidney failure, unspecified: Secondary | ICD-10-CM | POA: Diagnosis not present

## 2022-02-20 DIAGNOSIS — I959 Hypotension, unspecified: Secondary | ICD-10-CM | POA: Diagnosis not present

## 2022-02-20 DIAGNOSIS — E039 Hypothyroidism, unspecified: Secondary | ICD-10-CM | POA: Diagnosis not present

## 2022-02-20 DIAGNOSIS — K7460 Unspecified cirrhosis of liver: Secondary | ICD-10-CM | POA: Diagnosis not present

## 2022-02-20 DIAGNOSIS — E860 Dehydration: Secondary | ICD-10-CM | POA: Diagnosis present

## 2022-02-20 DIAGNOSIS — R1030 Lower abdominal pain, unspecified: Principal | ICD-10-CM

## 2022-02-20 DIAGNOSIS — R69 Illness, unspecified: Secondary | ICD-10-CM | POA: Diagnosis not present

## 2022-02-20 DIAGNOSIS — Z8673 Personal history of transient ischemic attack (TIA), and cerebral infarction without residual deficits: Secondary | ICD-10-CM

## 2022-02-20 DIAGNOSIS — Z743 Need for continuous supervision: Secondary | ICD-10-CM | POA: Diagnosis not present

## 2022-02-20 DIAGNOSIS — J9811 Atelectasis: Secondary | ICD-10-CM | POA: Diagnosis not present

## 2022-02-20 DIAGNOSIS — N1832 Chronic kidney disease, stage 3b: Secondary | ICD-10-CM | POA: Diagnosis not present

## 2022-02-20 DIAGNOSIS — R8290 Unspecified abnormal findings in urine: Secondary | ICD-10-CM

## 2022-02-20 DIAGNOSIS — Z888 Allergy status to other drugs, medicaments and biological substances status: Secondary | ICD-10-CM

## 2022-02-20 DIAGNOSIS — Z7983 Long term (current) use of bisphosphonates: Secondary | ICD-10-CM

## 2022-02-20 DIAGNOSIS — K7031 Alcoholic cirrhosis of liver with ascites: Secondary | ICD-10-CM | POA: Diagnosis not present

## 2022-02-20 DIAGNOSIS — D631 Anemia in chronic kidney disease: Secondary | ICD-10-CM | POA: Diagnosis not present

## 2022-02-20 DIAGNOSIS — I129 Hypertensive chronic kidney disease with stage 1 through stage 4 chronic kidney disease, or unspecified chronic kidney disease: Secondary | ICD-10-CM | POA: Diagnosis not present

## 2022-02-20 DIAGNOSIS — I361 Nonrheumatic tricuspid (valve) insufficiency: Secondary | ICD-10-CM | POA: Diagnosis not present

## 2022-02-20 DIAGNOSIS — E785 Hyperlipidemia, unspecified: Secondary | ICD-10-CM | POA: Diagnosis present

## 2022-02-20 DIAGNOSIS — K766 Portal hypertension: Secondary | ICD-10-CM | POA: Diagnosis not present

## 2022-02-20 DIAGNOSIS — N390 Urinary tract infection, site not specified: Secondary | ICD-10-CM | POA: Diagnosis not present

## 2022-02-20 DIAGNOSIS — D6859 Other primary thrombophilia: Secondary | ICD-10-CM | POA: Diagnosis present

## 2022-02-20 DIAGNOSIS — I272 Pulmonary hypertension, unspecified: Secondary | ICD-10-CM | POA: Diagnosis not present

## 2022-02-20 DIAGNOSIS — F32A Depression, unspecified: Secondary | ICD-10-CM | POA: Diagnosis not present

## 2022-02-20 DIAGNOSIS — R1900 Intra-abdominal and pelvic swelling, mass and lump, unspecified site: Secondary | ICD-10-CM | POA: Diagnosis not present

## 2022-02-20 DIAGNOSIS — R0689 Other abnormalities of breathing: Secondary | ICD-10-CM | POA: Diagnosis not present

## 2022-02-20 DIAGNOSIS — D62 Acute posthemorrhagic anemia: Secondary | ICD-10-CM | POA: Diagnosis not present

## 2022-02-20 DIAGNOSIS — E119 Type 2 diabetes mellitus without complications: Secondary | ICD-10-CM | POA: Diagnosis not present

## 2022-02-20 DIAGNOSIS — K767 Hepatorenal syndrome: Secondary | ICD-10-CM | POA: Diagnosis present

## 2022-02-20 DIAGNOSIS — I81 Portal vein thrombosis: Secondary | ICD-10-CM

## 2022-02-20 DIAGNOSIS — Z66 Do not resuscitate: Secondary | ICD-10-CM | POA: Diagnosis not present

## 2022-02-20 DIAGNOSIS — R0609 Other forms of dyspnea: Secondary | ICD-10-CM | POA: Diagnosis present

## 2022-02-20 DIAGNOSIS — K3189 Other diseases of stomach and duodenum: Secondary | ICD-10-CM | POA: Diagnosis present

## 2022-02-20 DIAGNOSIS — Z79890 Hormone replacement therapy (postmenopausal): Secondary | ICD-10-CM

## 2022-02-20 DIAGNOSIS — E871 Hypo-osmolality and hyponatremia: Secondary | ICD-10-CM | POA: Diagnosis not present

## 2022-02-20 DIAGNOSIS — M1A00X0 Idiopathic chronic gout, unspecified site, without tophus (tophi): Secondary | ICD-10-CM | POA: Diagnosis not present

## 2022-02-20 DIAGNOSIS — F1020 Alcohol dependence, uncomplicated: Secondary | ICD-10-CM | POA: Diagnosis present

## 2022-02-20 DIAGNOSIS — I639 Cerebral infarction, unspecified: Secondary | ICD-10-CM | POA: Diagnosis not present

## 2022-02-20 DIAGNOSIS — I10 Essential (primary) hypertension: Secondary | ICD-10-CM | POA: Diagnosis not present

## 2022-02-20 DIAGNOSIS — I4891 Unspecified atrial fibrillation: Secondary | ICD-10-CM | POA: Diagnosis not present

## 2022-02-20 DIAGNOSIS — Z8249 Family history of ischemic heart disease and other diseases of the circulatory system: Secondary | ICD-10-CM

## 2022-02-20 DIAGNOSIS — E8770 Fluid overload, unspecified: Secondary | ICD-10-CM | POA: Diagnosis not present

## 2022-02-20 LAB — CBC WITH DIFFERENTIAL/PLATELET
Abs Immature Granulocytes: 0.24 10*3/uL — ABNORMAL HIGH (ref 0.00–0.07)
Basophils Absolute: 0 10*3/uL (ref 0.0–0.1)
Basophils Relative: 0 %
Eosinophils Absolute: 0.1 10*3/uL (ref 0.0–0.5)
Eosinophils Relative: 0 %
HCT: 33.8 % — ABNORMAL LOW (ref 36.0–46.0)
Hemoglobin: 10.6 g/dL — ABNORMAL LOW (ref 12.0–15.0)
Immature Granulocytes: 1 %
Lymphocytes Relative: 8 %
Lymphs Abs: 1.4 10*3/uL (ref 0.7–4.0)
MCH: 25.5 pg — ABNORMAL LOW (ref 26.0–34.0)
MCHC: 31.4 g/dL (ref 30.0–36.0)
MCV: 81.4 fL (ref 80.0–100.0)
Monocytes Absolute: 0.8 10*3/uL (ref 0.1–1.0)
Monocytes Relative: 5 %
Neutro Abs: 15.6 10*3/uL — ABNORMAL HIGH (ref 1.7–7.7)
Neutrophils Relative %: 86 %
Platelets: 305 10*3/uL (ref 150–400)
RBC: 4.15 MIL/uL (ref 3.87–5.11)
RDW: 20.5 % — ABNORMAL HIGH (ref 11.5–15.5)
WBC: 18.1 10*3/uL — ABNORMAL HIGH (ref 4.0–10.5)
nRBC: 0 % (ref 0.0–0.2)

## 2022-02-20 LAB — COMPREHENSIVE METABOLIC PANEL
ALT: 94 U/L — ABNORMAL HIGH (ref 0–44)
AST: 238 U/L — ABNORMAL HIGH (ref 15–41)
Albumin: 2.5 g/dL — ABNORMAL LOW (ref 3.5–5.0)
Alkaline Phosphatase: 133 U/L — ABNORMAL HIGH (ref 38–126)
Anion gap: 8 (ref 5–15)
BUN: 45 mg/dL — ABNORMAL HIGH (ref 8–23)
CO2: 19 mmol/L — ABNORMAL LOW (ref 22–32)
Calcium: 8.5 mg/dL — ABNORMAL LOW (ref 8.9–10.3)
Chloride: 102 mmol/L (ref 98–111)
Creatinine, Ser: 2.66 mg/dL — ABNORMAL HIGH (ref 0.44–1.00)
GFR, Estimated: 19 mL/min — ABNORMAL LOW (ref >60)
Glucose, Bld: 124 mg/dL — ABNORMAL HIGH (ref 70–99)
Potassium: 5.2 mmol/L — ABNORMAL HIGH (ref 3.5–5.1)
Sodium: 129 mmol/L — ABNORMAL LOW (ref 135–145)
Total Bilirubin: 1.5 mg/dL — ABNORMAL HIGH (ref 0.3–1.2)
Total Protein: 6.8 g/dL (ref 6.5–8.1)

## 2022-02-20 LAB — TYPE AND SCREEN
ABO/RH(D): A POS
Antibody Screen: NEGATIVE

## 2022-02-20 LAB — RESP PANEL BY RT-PCR (FLU A&B, COVID) ARPGX2
Influenza A by PCR: NEGATIVE
Influenza B by PCR: NEGATIVE
SARS Coronavirus 2 by RT PCR: NEGATIVE

## 2022-02-20 LAB — APTT: aPTT: 39 seconds — ABNORMAL HIGH (ref 24–36)

## 2022-02-20 LAB — LIPASE, BLOOD: Lipase: 43 U/L (ref 11–51)

## 2022-02-20 LAB — TROPONIN I (HIGH SENSITIVITY)
Troponin I (High Sensitivity): 39 ng/L — ABNORMAL HIGH (ref <18)
Troponin I (High Sensitivity): 32 ng/L — ABNORMAL HIGH (ref <18)

## 2022-02-20 LAB — PROTIME-INR
INR: 1.3 — ABNORMAL HIGH (ref 0.8–1.2)
Prothrombin Time: 15.9 seconds — ABNORMAL HIGH (ref 11.4–15.2)

## 2022-02-20 MED ORDER — SODIUM CHLORIDE 0.9 % IV SOLN
2.0000 g | INTRAVENOUS | Status: DC
  Administered 2022-02-21: 2 g via INTRAVENOUS
  Filled 2022-02-20 (×2): qty 20

## 2022-02-20 MED ORDER — OCUVITE-LUTEIN PO CAPS
ORAL_CAPSULE | Freq: Every day | ORAL | Status: DC
Start: 2022-02-21 — End: 2022-02-21
  Filled 2022-02-20: qty 1

## 2022-02-20 MED ORDER — MORPHINE SULFATE (PF) 4 MG/ML IV SOLN
4.0000 mg | Freq: Once | INTRAVENOUS | Status: DC
  Filled 2022-02-20: qty 1

## 2022-02-20 MED ORDER — ALLOPURINOL 100 MG PO TABS
200.0000 mg | ORAL_TABLET | Freq: Every day | ORAL | Status: DC
Start: 2022-02-21 — End: 2022-02-22
  Administered 2022-02-21: 200 mg via ORAL
  Filled 2022-02-20: qty 2

## 2022-02-20 MED ORDER — SERTRALINE HCL 50 MG PO TABS
50.0000 mg | ORAL_TABLET | Freq: Every day | ORAL | Status: DC
  Administered 2022-02-21 – 2022-02-23 (×3): 50 mg via ORAL
  Filled 2022-02-20 (×3): qty 1

## 2022-02-20 MED ORDER — ONE-DAILY MULTI VITAMINS PO TABS: 1.0000 | ORAL_TABLET | Freq: Every day | ORAL | Status: DC

## 2022-02-20 MED ORDER — SODIUM CHLORIDE 0.9% FLUSH
3.0000 mL | Freq: Two times a day (BID) | INTRAVENOUS | Status: DC
  Administered 2022-02-20 – 2022-02-22 (×3): 3 mL via INTRAVENOUS

## 2022-02-20 MED ORDER — VITAMIN D 25 MCG (1000 UNIT) PO TABS
2000.0000 [IU] | ORAL_TABLET | Freq: Every day | ORAL | Status: DC
  Administered 2022-02-21 – 2022-02-23 (×3): 2000 [IU] via ORAL
  Filled 2022-02-20 (×3): qty 2

## 2022-02-20 MED ORDER — MIDODRINE HCL 5 MG PO TABS
10.0000 mg | ORAL_TABLET | Freq: Three times a day (TID) | ORAL | Status: DC
  Administered 2022-02-20 – 2022-02-23 (×10): 10 mg via ORAL
  Filled 2022-02-20 (×10): qty 2

## 2022-02-20 MED ORDER — ALBUMIN HUMAN 25 % IV SOLN
12.5000 g | Freq: Once | INTRAVENOUS | Status: AC
  Administered 2022-02-20: 12.5 g via INTRAVENOUS
  Filled 2022-02-20: qty 50

## 2022-02-20 MED ORDER — SODIUM CHLORIDE 0.9% FLUSH
3.0000 mL | INTRAVENOUS | Status: DC | PRN
  Administered 2022-02-22: 3 mL via INTRAVENOUS

## 2022-02-20 MED ORDER — ONDANSETRON HCL 4 MG/2ML IJ SOLN
4.0000 mg | Freq: Once | INTRAMUSCULAR | Status: AC
Start: 2022-02-20 — End: 2022-02-20
  Administered 2022-02-20: 4 mg via INTRAVENOUS
  Filled 2022-02-20: qty 2

## 2022-02-20 MED ORDER — POLYSACCHARIDE IRON COMPLEX 150 MG PO CAPS
150.0000 mg | ORAL_CAPSULE | Freq: Every day | ORAL | Status: DC
  Administered 2022-02-21 – 2022-02-23 (×3): 150 mg via ORAL
  Filled 2022-02-20 (×3): qty 1

## 2022-02-20 MED ORDER — SODIUM CHLORIDE 0.9 % IV SOLN: 250.0000 mL | INTRAVENOUS | Status: DC | PRN

## 2022-02-20 MED ORDER — ACETAMINOPHEN 500 MG PO TABS
1000.0000 mg | ORAL_TABLET | Freq: Four times a day (QID) | ORAL | Status: DC | PRN
  Administered 2022-02-22: 1000 mg via ORAL
  Filled 2022-02-20: qty 2

## 2022-02-20 MED ORDER — SODIUM CHLORIDE 0.9 % IV SOLN
1.0000 g | Freq: Once | INTRAVENOUS | Status: AC
  Administered 2022-02-20: 1 g via INTRAVENOUS
  Filled 2022-02-20: qty 10

## 2022-02-20 MED ORDER — AMIODARONE HCL 200 MG PO TABS
200.0000 mg | ORAL_TABLET | Freq: Every day | ORAL | Status: DC
  Administered 2022-02-21 – 2022-02-23 (×3): 200 mg via ORAL
  Filled 2022-02-20 (×3): qty 1

## 2022-02-20 MED ORDER — LEVOTHYROXINE SODIUM 100 MCG PO TABS
200.0000 ug | ORAL_TABLET | Freq: Every morning | ORAL | Status: DC
  Administered 2022-02-21 – 2022-02-23 (×3): 200 ug via ORAL
  Filled 2022-02-20 (×3): qty 2

## 2022-02-20 MED ORDER — SODIUM CHLORIDE 0.9 % IV SOLN: INTRAVENOUS | Status: DC

## 2022-02-20 MED ORDER — LACTATED RINGERS IV BOLUS
1000.0000 mL | Freq: Once | INTRAVENOUS | Status: AC
Start: 2022-02-20 — End: 2022-02-20
  Administered 2022-02-20: 1000 mL via INTRAVENOUS

## 2022-02-20 MED ORDER — ONDANSETRON HCL 4 MG/2ML IJ SOLN: 4.0000 mg | Freq: Four times a day (QID) | INTRAMUSCULAR | Status: DC | PRN

## 2022-02-20 MED ORDER — ONDANSETRON HCL 4 MG PO TABS: 4.0000 mg | ORAL_TABLET | Freq: Four times a day (QID) | ORAL | Status: DC | PRN

## 2022-02-20 MED ORDER — PANTOPRAZOLE SODIUM 40 MG IV SOLR
40.0000 mg | INTRAVENOUS | Status: DC
  Administered 2022-02-20 – 2022-02-23 (×4): 40 mg via INTRAVENOUS
  Filled 2022-02-20 (×4): qty 10

## 2022-02-20 NOTE — Assessment & Plan Note (Addendum)
Stable Continue Synthroid 

## 2022-02-20 NOTE — Consult Note (Signed)
? ? ?Diane Lame, MD [facility]  ?[address] ?[place], [place] [address] ?Phone: [phone] ?Fax : [phone] ? Consultation ? ?Referring Provider:     Dr. Francine Graven ?Primary Care Physician:  [name], MD ?Primary Gastroenterologist:  Dr. Therisa Doyne         ?Reason for Consultation:     Ascites ? ?Date of Admission:  02/20/2022 ?Date of Consultation:  02/20/2022 ?       ? HPI:   ?Diane Pruitt is a 71 y.o. female who has a history of cirrhosis and was in the ED on 4/17 for abdominal distention after recent paracentesis.  The patient had a hypotensive episode after the paracentesis.  The patient had been reported to have a similar episode 2 weeks prior to that ED visit.  At that time she was reporting weakness diarrhea and increased abdominal distention.  The patient is followed by [facility] in [place].  The patient also has a history of hypertension chronic kidney disease with A-fib and hypothyroidism.  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  The patient had a paracentesis on the 17th of this month with 6.3 L of fluid removed.  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  The patient's INR is 1.3 with her INR being 1.74 weeks ago but 1.23 weeks ago.  The patient also has elevated liver enzymes with a AST of 238, ALT of 94 with an alkaline phosphatase of 133 and a total bilirubin of 1.5 today.  The patient's white cell count 8 days ago was 12 and now is 18.1 and the patient's platelets are 304. ? ?The patient's daughter states that her mother has not been drinking since February that she knows about and that she has been in rehabilitation.  The patient came to this hospital because it was close to the rehabilitation and they  called EMS.  They also sore their primary gastroenterologist recently and was told that a TIPS may be helpful to control the ascites.  The patient denies any abdominal pain at the present time but is reported to be short of breath. ? ?The patient reports that she has diarrhea 6-7 times a day with waking up in the night to have diarrhea and also episodes where she can continue to the bathroom in time. ? ?Past Medical History:  ?Diagnosis Date  ? Atrial fibrillation ([place])   ? Barrett esophagus   ? Chronic kidney disease (CKD), stage III (moderate) (HCC)   ? Cirrhosis ([place])   ? Dysrhythmia   ? Atrial Fibrillation 2 years ago  ? Hypertension   ? Ischemic stroke ([place]) 01/25/2018  ? Stroke [facility])   ? ? ?Past Surgical History:  ?Procedure Laterality Date  ? CESAREAN SECTION    ? ESOPHAGOGASTRODUODENOSCOPY (EGD) WITH PROPOFOL N/A 01/05/2022  ? Procedure: ESOPHAGOGASTRODUODENOSCOPY (EGD) WITH PROPOFOL;  Surgeon: Ronnette Juniper, MD;  Location: [facility];  Service: Gastroenterology;  Laterality: N/A;  ? EYE SURGERY    ? INTRAOCULAR LENS INSERTION Bilateral About 3 years ago  ? IR PARACENTESIS  02/12/2022  ? KYPHOPLASTY N/A 02/18/2020  ? Procedure: KYPHOPLASTY L2;  Surgeon: Melina Schools, MD;  Location: [facility];  Service: Orthopedics;  Laterality: N/A;  60 mins  ? TONSILLECTOMY    ? TUBAL LIGATION    ? ? ?  Prior to Admission medications   ?Medication Sig Start Date End Date Taking? Authorizing Provider  ?acetaminophen (TYLENOL) 500 MG tablet Take 1,000 mg by mouth every 6 (six) hours as needed for headache or mild pain.    [provider]  ?alendronate (FOSAMAX) 70 MG tablet Take 70 mg by mouth every Monday.    [provider]  ?allopurinol (ZYLOPRIM) 100 MG tablet Take 200 mg by mouth daily. 01/20/20   [provider]  ?amiodarone (PACERONE) 200 MG tablet Take 200 mg by mouth in the morning and at bedtime.  02/27/18   [provider]  ?Cholecalciferol (VITAMIN D) 50 MCG (2000 UT) tablet Take 2,000  Units by mouth daily. 05/03/21   [provider]  ?iron polysaccharides (NIFEREX) 150 MG capsule Take 1 capsule (150 mg total) by mouth daily. 01/27/22   Kathie Dike, MD  ?levothyroxine (SYNTHROID) 200 MCG tablet Take 200 mcg by mouth every morning. 12/26/21   [provider]  ?midodrine (PROAMATINE) 10 MG tablet Take 1 tablet (10 mg total) by mouth 3 (three) times daily with meals. 02/01/22   Barb Merino, MD  ?Multiple Vitamin (MULTIVITAMIN) tablet Take 1 tablet by mouth daily.    [provider]  ?Multiple Vitamins-Minerals (PRESERVISION AREDS 2 PO) Take 1 capsule by mouth daily. Preservision AREDS-2 250 mg- 90 mg- 40 mg-1 mg    [provider]  ?ondansetron (ZOFRAN-ODT) 4 MG disintegrating tablet Take 4 mg by mouth every 4 (four) hours as needed for nausea or vomiting. 01/27/22   [provider]  ?pantoprazole (PROTONIX) 40 MG tablet Take 1 tablet (40 mg total) by mouth 2 (two) times daily before a meal. 01/26/22 02/25/22  Kathie Dike, MD  ?polyethylene glycol (MIRALAX / GLYCOLAX) 17 g packet Take 17 g by mouth daily as needed for mild constipation. 01/26/22   Kathie Dike, MD  ?sertraline (ZOLOFT) 50 MG tablet Take 50 mg by mouth daily. 11/01/21   [provider]  ? ? ?Family History  ?Problem Relation Age of Onset  ? Hypertension Mother   ? Hyperlipidemia Mother   ? Atrial fibrillation Mother   ? Hyperlipidemia Father   ? Atrial fibrillation Father   ?  ? ?Social History  ? ?Tobacco Use  ? Smoking status: Never  ? Smokeless tobacco: Never  ?Vaping Use  ? Vaping Use: Never used  ?Substance Use Topics  ? Alcohol use: Never  ? Drug use: Never  ? ? ?Allergies as of 02/20/2022 - Review Complete 02/20/2022  ?Allergen Reaction Noted  ? Colchicine Other (See Comments) 07/17/2021  ? ? ?Review of Systems:    ?All systems reviewed and negative except where noted in HPI. ? ? Physical Exam:  ?Vital signs in last 24 hours: ?Temp:  [97.9 ?F (36.6 ?C)] 97.9 ?F (36.6 ?C)  (04/25 1404) ?Pulse Rate:  [67-70] 67 (04/25 1500) ?Resp:  [16-20] 16 (04/25 1500) ?BP: (122-123)/(54-61) 122/54 (04/25 1500) ?SpO2:  [94 %-95 %] 95 % (04/25 1500) ?Weight:  [77.1 kg] 77.1 kg (04/25 1404) ?  ?General:   Pleasant, cooperative in NAD ?Head:  Normocephalic and atraumatic. ?Eyes:   No icterus.   Conjunctiva pink. PERRLA. ?Ears:  Normal auditory acuity. ?Neck:  Supple; no masses or thyroidomegaly ?Lungs: Respirations even and unlabored. Lungs clear to auscultation bilaterally.   No wheezes, crackles, or rhonchi.  ?Heart:  Regular rate and rhythm;  Without murmur, clicks, rubs or gallops ?Abdomen:  Soft, Positive distention with ascites, nontender. Normal bowel sounds. No appreciable masses or  hepatomegaly.  No rebound or guarding.  ?Rectal:  Not performed. ?Msk:  Symmetrical without gross deformities.   ?Extremities:  Without edema, cyanosis or clubbing. ?Neurologic:  Alert and Confused;  grossly normal neurologically. ?Skin:  Intact without significant lesions or rashes. ?Cervical Nodes:  No significant cervical adenopathy. ?Psych:  Alert and cooperative. Normal affect. ? ?LAB RESULTS: ?Recent Labs  ?  02/20/22 ?1408  ?WBC 18.1*  ?HGB 10.6*  ?HCT 33.8*  ?PLT 305  ? ?BMET ?Recent Labs  ?  02/20/22 ?1408  ?NA 129*  ?K 5.2*  ?CL 102  ?CO2 19*  ?GLUCOSE 124*  ?BUN 45*  ?CREATININE 2.66*  ?CALCIUM 8.5*  ? ?LFT ?Recent Labs  ?  02/20/22 ?1408  ?PROT 6.8  ?ALBUMIN 2.5*  ?AST 238*  ?ALT 94*  ?ALKPHOS 133*  ?BILITOT 1.5*  ? ?PT/INR ?Recent Labs  ?  02/20/22 ?1408  ?LABPROT 15.9*  ?INR 1.3*  ? ? ?STUDIES: ?DG Chest Portable 1 View ? ?Result Date: 02/20/2022 ?CLINICAL DATA:  DOE, abdominal ascites EXAM: PORTABLE CHEST 1 VIEW COMPARISON:  February 12, 2022 chest x-ray. FINDINGS: Low lung volumes. Linear bibasilar opacities, compatible with subsegmental atelectasis. No confluent consolidation. No visible pleural effusions or pneumothorax. Cardiomediastinal silhouette is similar. Polyarticular degenerative change.  IMPRESSION: Low lung volumes with subsegmental bibasilar atelectasis. Electronically Signed   By: Margaretha Sheffield M.D.   On: 02/20/2022 14:42   ? ? ? Impression / Plan:  ? ?Assessment: ?Principal Problem: ?  A

## 2022-02-20 NOTE — Assessment & Plan Note (Addendum)
Stable Continue sertraline 

## 2022-02-20 NOTE — Assessment & Plan Note (Addendum)
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 ?She presented 02/20/22 for evaluation of worsening ascites without abdominal pain, emesis w/ bilious material, coffee grounds material ?? IV Protonix for GI prophylaxis ?? Rocephin was d/c 02/22/22  ?? Hemoglobin stable since admission - held off on octreotide for now ?? IR for paracentesis 02/21/22: Successful ultrasound guided paracentesis from the RUQ.  Yielded 6.6 L of clear yellow fluid. No immediate complications. The patient has required >/=2 paracenteses in a 30 day period and a screening evaluation by the [facility] has been arranged. ?? Plan at this time to transfer to [facility] for TIPS procedure, at that time can remove portal vein thrombosis so will hold off on anticoagulation for now and can defer EGD  ?

## 2022-02-20 NOTE — ED Triage Notes (Signed)
Pt to ED via [facility] from [facility] with abdomianl distension worsening x 2 weeks since [facility] 4-5L drained. Hx ascited. Abdomen not painful on arrival but distension noted. Pt endorses increased dyspnea on exertion and nausea without emesis.  ?

## 2022-02-20 NOTE — ED Provider Notes (Signed)
? ?[facility] ?Provider Note ? ? ? Event Date/Time  ? First MD Initiated Contact with Patient 02/20/22 1429   ?  (approximate) ? ? ?History  ? ?Abdominal Pain ? ? ?HPI ? ?Diane Pruitt is a 71 y.o. female who presents to the ED for evaluation of Abdominal Pain ?  ?I reviewed medical DC summary from 4/6.  History of atrial fibrillation on Eliquis, HTN, HLD and cirrhosis.  She was admitted for orthostasis, GI bleeding requiring blood transfusions. ?Patient had her ascites tapped and 4 to 5 L drained 8 days ago at [facility].  Transient hypotension associated with this. ? ?Patient presents to the ED today for evaluation of increasing abdominal distention and pain.  She reports merrily lower abdominal pain, but reports pain throughout her abdomen alongside increasing distention.  Denies fevers, emesis.  Reports chronic diarrhea and chronic melena that she attributes to her iron supplementation. ? ?Physical Exam  ? ?Triage Vital Signs: ?ED Triage Vitals [02/20/22 1404]  ?Enc Vitals Group  ?   BP 123/61  ?   Pulse Rate 70  ?   Resp 20  ?   Temp 97.9 ?F (36.6 ?C)  ?   Temp Source Oral  ?   SpO2 94 %  ?   Weight 170 lb (77.1 kg)  ?   Height '5\' 2"'$  (1.575 m)  ?   Head Circumference   ?   Peak Flow   ?   Pain Score 0  ?   Pain Loc   ?   Pain Edu?   ?   Excl. in [place]?   ? ? ?Most recent vital signs: ?Vitals:  ? 02/20/22 1404 02/20/22 1500  ?BP: 123/61 (!) 122/54  ?Pulse: 70 67  ?Resp: 20 16  ?Temp: 97.9 ?F (36.6 ?C)   ?SpO2: 94% 95%  ? ? ?General: Awake, no distress.  Pleasant and conversational full sentences. ?CV:  Good peripheral perfusion.  ?Resp:  Normal effort.  ?Abd:  Moderately distended.  Not tense.  Lower abdominal tenderness without peritoneal features.  Upper abdomen is benign. ?MSK:  No deformity noted.  ?Neuro:  No focal deficits appreciated. ?Other:   ? ? ?ED Results / Procedures / Treatments  ? ?Labs ?(all labs ordered are listed, but only abnormal results are displayed) ?Labs Reviewed   ?CBC WITH DIFFERENTIAL/PLATELET - Abnormal; Notable for the following components:  ?    Result Value  ? WBC 18.1 (*)   ? Hemoglobin 10.6 (*)   ? HCT 33.8 (*)   ? MCH 25.5 (*)   ? RDW 20.5 (*)   ? Neutro Abs 15.6 (*)   ? Abs Immature Granulocytes 0.24 (*)   ? All other components within normal limits  ?COMPREHENSIVE METABOLIC PANEL - Abnormal; Notable for the following components:  ? Sodium 129 (*)   ? Potassium 5.2 (*)   ? CO2 19 (*)   ? Glucose, Bld 124 (*)   ? BUN 45 (*)   ? Creatinine, Ser 2.66 (*)   ? Calcium 8.5 (*)   ? Albumin 2.5 (*)   ? AST 238 (*)   ? ALT 94 (*)   ? Alkaline Phosphatase 133 (*)   ? Total Bilirubin 1.5 (*)   ? GFR, Estimated 19 (*)   ? All other components within normal limits  ?PROTIME-INR - Abnormal; Notable for the following components:  ? Prothrombin Time 15.9 (*)   ? INR 1.3 (*)   ? All other components within  normal limits  ?APTT - Abnormal; Notable for the following components:  ? aPTT 39 (*)   ? All other components within normal limits  ?TROPONIN I (HIGH SENSITIVITY) - Abnormal; Notable for the following components:  ? Troponin I (High Sensitivity) 39 (*)   ? All other components within normal limits  ?RESP PANEL BY RT-PCR (FLU A&B, COVID) ARPGX2  ?LIPASE, BLOOD  ?URINALYSIS, ROUTINE W REFLEX MICROSCOPIC  ?TYPE AND SCREEN  ? ? ?EKG ?Sinus rhythm, rate of 70 bpm.  Normal axis.  Incomplete left bundle.  No STEMI. ? ?RADIOLOGY ?1 view CXR with right linear opacity concerning for atelectasis versus infiltrate. ? ?Official radiology report(s): ?DG Chest Portable 1 View ? ?Result Date: 02/20/2022 ?CLINICAL DATA:  DOE, abdominal ascites EXAM: PORTABLE CHEST 1 VIEW COMPARISON:  February 12, 2022 chest x-ray. FINDINGS: Low lung volumes. Linear bibasilar opacities, compatible with subsegmental atelectasis. No confluent consolidation. No visible pleural effusions or pneumothorax. Cardiomediastinal silhouette is similar. Polyarticular degenerative change. IMPRESSION: Low lung volumes with  subsegmental bibasilar atelectasis. Electronically Signed   By: Margaretha Sheffield M.D.   On: 02/20/2022 14:42   ? ?PROCEDURES and INTERVENTIONS: ? ?.1-3 Lead EKG Interpretation ?Performed by: Vladimir Crofts, MD ?Authorized by: Vladimir Crofts, MD  ? ?  Interpretation: normal   ?  ECG rate:  70 ?  ECG rate assessment: normal   ?  Rhythm: sinus rhythm   ?  Ectopy: none   ?  Conduction: normal   ? ?Medications  ?cefTRIAXone (ROCEPHIN) 1 g in sodium chloride 0.9 % 100 mL IVPB (has no administration in time range)  ?albumin human 25 % solution 12.5 g (has no administration in time range)  ?lactated ringers bolus 1,000 mL (has no administration in time range)  ?ondansetron (ZOFRAN) injection 4 mg (4 mg Intravenous Given 02/20/22 1510)  ? ? ? ?IMPRESSION / MDM / ASSESSMENT AND PLAN / ED COURSE  ?I reviewed the triage vital signs and the nursing notes. ? ?71 year old cirrhotic presents to the ED with reaccumulation of ascites and concerns for cystitis versus SBP.  She has normal vital signs on room air.  Look systemically well.  Does have a leukocytosis without anemia or hemoglobin drop.  Metabolic panel is worsening today with metabolic acidosis, hyponatremia, AKI and mild hyperkalemia.  Stigmata for chronic liver dysfunction.  No signs of pancreatitis.  Troponin is slightly elevated but without signs of acute ischemia on EKG.  Less likely ACS.  We will provide Rocephin empirically treat for SBP versus acute cystitis considering her lower abdominal discomfort in the setting of reaccumulation of ascites after recent tap.  Not meeting sepsis criteria with no other vital derangements beyond her leukocytosis.  We will consult medicine for admission to facilitate paracentesis and addressing metabolic derangements. ? ?Clinical Course as of 02/20/22 1519  ?Tue Feb 20, 2022  ?Johnson, reexamined and obtain some additional history. [DS]  ?1517 Signed out to oncoming provider following up urinalysis. [DS]  ?  ?Clinical Course User  Index ?[DS] Vladimir Crofts, MD  ? ? ? ?FINAL CLINICAL IMPRESSION(S) / ED DIAGNOSES  ? ?Final diagnoses:  ?Lower abdominal pain  ?Ascites due to alcoholic cirrhosis (HCC)  ? ? ? ?Rx / DC Orders  ? ?ED Discharge Orders   ? ? None  ? ?  ? ? ? ?Note:  This document was prepared using Dragon voice recognition software and may include unintentional dictation errors. ?  ?Vladimir Crofts, MD ?02/20/22 1520 ? ?

## 2022-02-20 NOTE — Assessment & Plan Note (Addendum)
?   Not on anticoagulation due to recent bleeding ?? Continue Amiodarone  ?

## 2022-02-20 NOTE — Assessment & Plan Note (Addendum)
Most likely related to volume overload ?? Improved to Na 134 02/22/22, down slightly to 133 today 02/23/22  ?? Will monitor BMP ? ?

## 2022-02-20 NOTE — Assessment & Plan Note (Addendum)
Patient has a baseline serum creatinine of 1.3 and 02/20/22 at admission it was 2.66, 02/21/22 minimal improvement to 2.57 ?She had massive ascites but also intracellular fluid depletion ?Chart review shows she was hypotensive after her last paracentesis at [facility]. Hypotensive as well after paracentesis but not severely  ?AKI may be secondary to GI losses from diarrhea versus hepatorenal syndrome but not responding to IV fluid hydration, also receiving albumin  ?? Repeat renal parameters in a.m. 02/22/22 not significantly improving - consulted nephrology ?

## 2022-02-20 NOTE — H&P (Addendum)
?History and Physical  ? ? ?Patient: Diane Pruitt JKK:[id number] DOB: [birth year] ?DOA: 02/20/2022 ?DOS: the patient was seen and examined on 02/20/2022 ?PCP: [name], MD  ?Patient coming from: Home ? ?Chief Complaint:  ?Chief Complaint  ?Patient presents with  ? Abdominal Pain  ? ?HPI: Diane Pruitt is a 71 y.o. 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  Patient was recently seen at [facility] ER and had paracentesis with drainage of 4 - 5 L of ascitic fluid about a week ago. ?Patient complains of dyspnea with exertion which is new and nausea with coffee-ground emesis 1 day prior to her admission. ?Chart review shows that she was admitted at [facility] from 04/03 through 04/06 for upper GI bleed and had an upper endoscopy which showed normal esophagus and portal hypertensive gastropathy. ?She denies having any abdominal pain, no fever, no chills, no cough, no headache, no dizziness, no lightheadedness, no changes in her bowel habits, no focal deficit or blurred vision. ? ? ?Review of Systems: As mentioned in the history of present illness. All other systems reviewed and are negative. ?Past Medical History:  ?Diagnosis Date  ? Atrial fibrillation ([place])   ? Barrett esophagus   ? Chronic kidney disease (CKD), stage III (moderate) (HCC)   ? Cirrhosis ([place])   ? Dysrhythmia   ? Atrial Fibrillation 2 years ago  ? Hypertension   ? Ischemic stroke ([place]) 01/25/2018  ? Stroke [facility])   ? ?Past Surgical History:  ?Procedure Laterality Date  ? CESAREAN SECTION    ? ESOPHAGOGASTRODUODENOSCOPY (EGD) WITH PROPOFOL N/A 01/05/2022  ? Procedure: ESOPHAGOGASTRODUODENOSCOPY (EGD) WITH PROPOFOL;  Surgeon: Ronnette Juniper, MD;  Location: [facility];  Service: Gastroenterology;   Laterality: N/A;  ? EYE SURGERY    ? INTRAOCULAR LENS INSERTION Bilateral About 3 years ago  ? IR PARACENTESIS  02/12/2022  ? KYPHOPLASTY N/A 02/18/2020  ? Procedure: KYPHOPLASTY L2;  Surgeon: Melina Schools, MD;  Location: [place];  Service: Orthopedics;  Laterality: N/A;  60 mins  ? TONSILLECTOMY    ? TUBAL LIGATION    ? ?Social History:  reports that she has never smoked. She has never used smokeless tobacco. She reports that she does not drink alcohol and does not use drugs. ? ?Allergies  ?Allergen Reactions  ? Colchicine Other (See Comments)  ?  Stomach upset, diarrhea  ? ? ?Family History  ?Problem Relation Age of Onset  ? Hypertension Mother   ? Hyperlipidemia Mother   ? Atrial fibrillation Mother   ? Hyperlipidemia Father   ? Atrial fibrillation Father   ? ? ?Prior to Admission medications   ?Medication Sig Start Date End Date Taking? Authorizing Provider  ?acetaminophen (TYLENOL) 500 MG tablet Take 1,000 mg by mouth every 6 (six) hours as needed for headache or mild pain.    [provider]  ?alendronate (FOSAMAX) 70 MG tablet Take 70 mg by mouth every Monday.    [provider]  ?allopurinol (ZYLOPRIM) 100 MG tablet Take 200 mg by mouth daily. 01/20/20   [provider]  ?amiodarone (PACERONE) 200 MG tablet Take 200 mg by mouth in the morning and at bedtime.  02/27/18   [provider]  ?Cholecalciferol (VITAMIN D) 50 MCG (2000 UT) tablet Take 2,000 Units by mouth daily. 05/03/21   [provider]  ?iron polysaccharides (NIFEREX) 150 MG capsule Take 1 capsule (150 mg total) by mouth daily. 01/27/22   Kathie Dike, MD  ?levothyroxine (SYNTHROID) 200 MCG tablet Take 200 mcg by mouth every morning. 12/26/21   [provider]  ?midodrine (PROAMATINE) 10 MG tablet Take 1 tablet (10 mg total) by mouth 3 (three) times daily with meals. 02/01/22   Barb Merino, MD  ?Multiple Vitamin (MULTIVITAMIN) tablet Take 1 tablet by mouth daily.    [provider]   ?Multiple Vitamins-Minerals (PRESERVISION AREDS 2 PO) Take 1 capsule by mouth daily. Preservision AREDS-2 250 mg- 90 mg- 40 mg-1 mg    [provider]  ?ondansetron (ZOFRAN-ODT) 4 MG disintegrating tablet Take 4 mg by mouth every 4 (four) hours as needed for nausea or vomiting. 01/27/22   [provider]  ?pantoprazole (PROTONIX) 40 MG tablet Take 1 tablet (40 mg total) by mouth 2 (two) times daily before a meal. 01/26/22 02/25/22  Kathie Dike, MD  ?polyethylene glycol (MIRALAX / GLYCOLAX) 17 g packet Take 17 g by mouth daily as needed for mild constipation. 01/26/22   Kathie Dike, MD  ?sertraline (ZOLOFT) 50 MG tablet Take 50 mg by mouth daily. 11/01/21   [provider]  ? ? ?Physical Exam: ?Vitals:  ? 02/20/22 1404 02/20/22 1500  ?BP: 123/61 (!) 122/54  ?Pulse: 70 67  ?Resp: 20 16  ?Temp: 97.9 ?F (36.6 ?C)   ?TempSrc: Oral   ?SpO2: 94% 95%  ?Weight: 77.1 kg   ?Height: '5\' 2"'$  (1.575 m)   ? ?Physical Exam ?Vitals and nursing note reviewed.  ?Constitutional:   ?   Comments: Chronically ill-appearing  ?HENT:  ?   Head: Normocephalic and atraumatic.  ?   Mouth/Throat:  ?   Mouth: Mucous membranes are moist.  ?Eyes:  ?   Comments: Pale conjunctiva   ?Cardiovascular:  ?   Rate and Rhythm: Normal rate and regular rhythm.  ?Pulmonary:  ?   Effort: Pulmonary effort is normal.  ?   Breath sounds: Rales present.  ?   Comments: Rales at the bases bilaterally ?Abdominal:  ?   General: Bowel sounds are normal. There is distension.  ?   Palpations: There is fluid wave.  ?   Comments: No tenderness with palpation  ?Skin: ?   General: Skin is warm and dry.  ?Neurological:  ?   General: No focal deficit present.  ?   Mental Status: She is alert.  ?Psychiatric:     ?   Mood and Affect: Mood normal.     ?   Behavior: Behavior normal.  ? ? ?Data Reviewed: ?Relevant notes from primary care and specialist visits, past discharge summaries as available in EHR, including Care Everywhere. ?Prior diagnostic  testing as pertinent to current admission diagnoses ?Updated medications and problem lists for reconciliation ?ED course, including vitals, labs, imaging, treatment and response to treatment ?Triage notes, nursing and pharmacy notes and ED provider's notes ?Notable results as noted in HPI ?Labs reviewed.  Sodium 129, potassium 5.2, chloride 102, bicarb 19, glucose 124, BUN 45, creatinine 2.6 above a baseline of 1.3, calcium 8.5, total protein 6.8, albumin 2.5, AST 238, ALT 94, alkaline phosphatase 133, total bilirubin 1.5, PT 15.9, INR 1.3, white count 18.1, hemoglobin 10.6, hematocrit 33.8, MCV 81.4, RDW 20.5, platelet count 305 ?Twelve-lead EKG reviewed by me shows normal sinus rhythm with PACs ?Chest x-ray reviewed by me shows Low lung volumes with subsegmental bibasilar atelectasis. ?There are no new  results to review at this time. ? 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 ?She presents for evaluation of worsening ascites but denies having any abdominal pain. ?Has had emesis containing bilious material as well as coffee grounds ?We will place patient on Rocephin for GI prophylaxis ?Place patient on IV Protonix ?Patient's hemoglobin is stable at this time.  We will hold off on octreotide for now ?Consult IR for paracentesis ? ?AKI (acute kidney injury) ([place]) ?Patient has a baseline serum creatinine of 1.3 and today on admission it is 2.6. ?She has massive ascites but also appears to have intracellular fluid depletion ?Gentle IV fluid hydration ?Repeat renal parameters in a.m. ? ?Hyponatremia ?Most likely related to volume overload ?Will monitor closely ? ? ?Acquired hypothyroidism ?Stable ?Continue Synthroid ? ?Depression ?Stable ?Continue sertraline ? ?Paroxysmal atrial fibrillation (HCC) ?Not on anticoagulation due to recent bleeding ?Continue Amiodarone for rate  control ? ? ? ? ? Advance Care Planning:   Code Status: Full Code  ? ?Consults: Gastroenterology ? ?Family Communication: Greater than 50% of time was spent discussing patient's condition and plan of care with patient and her

## 2022-02-21 ENCOUNTER — Inpatient Hospital Stay: Payer: Medicare HMO

## 2022-02-21 ENCOUNTER — Other Ambulatory Visit (HOSPITAL_COMMUNITY): Payer: Self-pay

## 2022-02-21 DIAGNOSIS — E785 Hyperlipidemia, unspecified: Secondary | ICD-10-CM

## 2022-02-21 DIAGNOSIS — I480 Paroxysmal atrial fibrillation: Secondary | ICD-10-CM

## 2022-02-21 DIAGNOSIS — M1A00X0 Idiopathic chronic gout, unspecified site, without tophus (tophi): Secondary | ICD-10-CM

## 2022-02-21 DIAGNOSIS — A498 Other bacterial infections of unspecified site: Secondary | ICD-10-CM

## 2022-02-21 DIAGNOSIS — E039 Hypothyroidism, unspecified: Secondary | ICD-10-CM | POA: Diagnosis not present

## 2022-02-21 DIAGNOSIS — D509 Iron deficiency anemia, unspecified: Secondary | ICD-10-CM

## 2022-02-21 DIAGNOSIS — K3189 Other diseases of stomach and duodenum: Secondary | ICD-10-CM

## 2022-02-21 DIAGNOSIS — D6859 Other primary thrombophilia: Secondary | ICD-10-CM

## 2022-02-21 DIAGNOSIS — K7460 Unspecified cirrhosis of liver: Secondary | ICD-10-CM | POA: Diagnosis not present

## 2022-02-21 DIAGNOSIS — N179 Acute kidney failure, unspecified: Secondary | ICD-10-CM

## 2022-02-21 DIAGNOSIS — R188 Other ascites: Secondary | ICD-10-CM | POA: Diagnosis not present

## 2022-02-21 DIAGNOSIS — K766 Portal hypertension: Secondary | ICD-10-CM

## 2022-02-21 DIAGNOSIS — Z8673 Personal history of transient ischemic attack (TIA), and cerebral infarction without residual deficits: Secondary | ICD-10-CM

## 2022-02-21 DIAGNOSIS — K7031 Alcoholic cirrhosis of liver with ascites: Secondary | ICD-10-CM | POA: Diagnosis not present

## 2022-02-21 LAB — ALBUMIN, PLEURAL OR PERITONEAL FLUID: Albumin, Fluid: <1.5 g/dL

## 2022-02-21 LAB — URINALYSIS, ROUTINE W REFLEX MICROSCOPIC
Bilirubin Urine: NEGATIVE
Glucose, UA: NEGATIVE mg/dL
Hgb urine dipstick: NEGATIVE
Ketones, ur: NEGATIVE mg/dL
Nitrite: NEGATIVE
Protein, ur: NEGATIVE mg/dL
Specific Gravity, Urine: 1.018 (ref 1.005–1.030)
WBC, UA: >50 WBC/hpf — ABNORMAL HIGH (ref 0–5)
pH: 5 (ref 5.0–8.0)

## 2022-02-21 LAB — CREATININE, URINE, RANDOM: Creatinine, Urine: 116 mg/dL

## 2022-02-21 LAB — GASTROINTESTINAL PANEL BY PCR, STOOL (REPLACES STOOL CULTURE)

## 2022-02-21 LAB — CBC
HCT: 29.1 % — ABNORMAL LOW (ref 36.0–46.0)
Hemoglobin: 9.4 g/dL — ABNORMAL LOW (ref 12.0–15.0)
MCH: 26 pg (ref 26.0–34.0)
MCHC: 32.3 g/dL (ref 30.0–36.0)
MCV: 80.6 fL (ref 80.0–100.0)
Platelets: 241 10*3/uL (ref 150–400)
RBC: 3.61 MIL/uL — ABNORMAL LOW (ref 3.87–5.11)
RDW: 20.5 % — ABNORMAL HIGH (ref 11.5–15.5)
WBC: 17.9 10*3/uL — ABNORMAL HIGH (ref 4.0–10.5)
nRBC: 0 % (ref 0.0–0.2)

## 2022-02-21 LAB — SODIUM, URINE, RANDOM: Sodium, Ur: <10 mmol/L

## 2022-02-21 LAB — MRSA NEXT GEN BY PCR, NASAL: MRSA by PCR Next Gen: NOT DETECTED

## 2022-02-21 LAB — BODY FLUID CELL COUNT WITH DIFFERENTIAL
Eos, Fluid: 0 %
Lymphs, Fluid: 12 %
Monocyte-Macrophage-Serous Fluid: 27 %
Neutrophil Count, Fluid: 61 %
Total Nucleated Cell Count, Fluid: 199 cu mm

## 2022-02-21 LAB — BASIC METABOLIC PANEL
Anion gap: 9 (ref 5–15)
BUN: 44 mg/dL — ABNORMAL HIGH (ref 8–23)
CO2: 21 mmol/L — ABNORMAL LOW (ref 22–32)
Calcium: 8.3 mg/dL — ABNORMAL LOW (ref 8.9–10.3)
Chloride: 103 mmol/L (ref 98–111)
Creatinine, Ser: 2.57 mg/dL — ABNORMAL HIGH (ref 0.44–1.00)
GFR, Estimated: 20 mL/min — ABNORMAL LOW (ref >60)
Glucose, Bld: 80 mg/dL (ref 70–99)
Potassium: 5.4 mmol/L — ABNORMAL HIGH (ref 3.5–5.1)
Sodium: 133 mmol/L — ABNORMAL LOW (ref 135–145)

## 2022-02-21 LAB — LACTATE DEHYDROGENASE, PLEURAL OR PERITONEAL FLUID: LD, Fluid: 29 U/L — ABNORMAL HIGH (ref 3–23)

## 2022-02-21 LAB — C DIFFICILE QUICK SCREEN W PCR REFLEX
C Diff antigen: POSITIVE — AB
C Diff interpretation: DETECTED
C Diff toxin: POSITIVE — AB

## 2022-02-21 LAB — GLUCOSE, PLEURAL OR PERITONEAL FLUID: Glucose, Fluid: 93 mg/dL

## 2022-02-21 LAB — PROTEIN, PLEURAL OR PERITONEAL FLUID: Total protein, fluid: <3 g/dL

## 2022-02-21 MED ORDER — PROSOURCE PLUS PO LIQD
30.0000 mL | Freq: Three times a day (TID) | ORAL | Status: DC
  Administered 2022-02-22 – 2022-02-23 (×3): 30 mL via ORAL
  Filled 2022-02-21 (×8): qty 30

## 2022-02-21 MED ORDER — ALBUMIN HUMAN 25 % IV SOLN
25.0000 g | Freq: Every day | INTRAVENOUS | Status: DC
  Administered 2022-02-21 – 2022-02-22 (×2): 25 g via INTRAVENOUS
  Filled 2022-02-21 (×2): qty 100

## 2022-02-21 MED ORDER — VANCOMYCIN HCL 125 MG PO CAPS
125.0000 mg | ORAL_CAPSULE | Freq: Four times a day (QID) | ORAL | Status: DC
  Administered 2022-02-21 – 2022-02-23 (×10): 125 mg via ORAL
  Filled 2022-02-21 (×10): qty 1

## 2022-02-21 MED ORDER — ADULT MULTIVITAMIN W/MINERALS CH
1.0000 | ORAL_TABLET | Freq: Every day | ORAL | Status: DC
  Administered 2022-02-21 – 2022-02-23 (×3): 1 via ORAL
  Filled 2022-02-21 (×3): qty 1

## 2022-02-21 MED ORDER — BOOST / RESOURCE BREEZE PO LIQD CUSTOM
1.0000 | Freq: Three times a day (TID) | ORAL | Status: DC
Start: 2022-02-21 — End: 2022-02-23
  Administered 2022-02-21 – 2022-02-23 (×5): 1 via ORAL

## 2022-02-21 NOTE — Progress Notes (Signed)
? ?Jonathon Bellows , MD ?[address] [place], [place], [place], [address] ?[address], [place], [place], [place], [address] ?Phone: [phone]  ?Fax: [phone] ? ? ?Diane Pruitt is being followed for decompensated liver disease , AKI Day 1 of follow up  ? ?Subjective: ?Still complains of abdominal distension , no other complaint ? ? ?Objective: ?Vital signs in last 24 hours: ?Vitals:  ? 02/20/22 1820 02/20/22 2035 02/21/22 0447 02/21/22 0800  ?BP: (!) 128/59 (!) 110/51 107/61 113/72  ?Pulse: 68 66 65 67  ?Resp:  '18 18 16  '$ ?Temp: (!) 97 ?F (36.1 ?C) 98.1 ?F (36.7 ?C) 98.1 ?F (36.7 ?C) 98.4 ?F (36.9 ?C)  ?TempSrc: Oral   Oral  ?SpO2: 92% 95% 95% 93%  ?Weight:      ?Height:      ? ?Weight change:  ? ?Intake/Output Summary (Last 24 hours) at 02/21/2022 0817 ?Last data filed at 02/21/2022 5956 ?Gross per 24 hour  ?Intake 930.39 ml  ?Output 150 ml  ?Net 780.39 ml  ? ? ? ?Exam: ?Heart:: Regular rate and rhythm ?Lungs: normal ?Abdomen: Distended soft free fluid present no guarding no rigidity ? ? ?Lab Results: ?'@LABTEST2'$ @ ?Micro Results: ?Recent Results (from the past 240 hour(s))  ?Resp Panel by RT-PCR (Flu A&B, Covid) Nasopharyngeal Swab     Status: None  ? Collection Time: 02/20/22  3:03 PM  ? Specimen: Nasopharyngeal Swab; Nasopharyngeal(NP) swabs in vial transport medium  ?Result Value Ref Range Status  ? SARS Coronavirus 2 by RT PCR NEGATIVE NEGATIVE Final  ?  Comment: (NOTE) ?SARS-CoV-2 target nucleic acids are NOT DETECTED. ? ?The SARS-CoV-2 RNA is generally detectable in upper respiratory ?specimens during the acute phase of infection. The lowest ?concentration of SARS-CoV-2 viral copies this assay can detect is ?138 copies/mL. A negative result does not preclude SARS-Cov-2 ?infection and should not be used as the sole basis for treatment or ?other patient management decisions. A negative result may occur with  ?improper specimen collection/handling, submission of specimen other ?than nasopharyngeal swab,  presence of viral mutation(s) within the ?areas targeted by this assay, and inadequate number of viral ?copies(<138 copies/mL). A negative result must be combined with ?clinical observations, patient history, and epidemiological ?information. The expected result is Negative. ? ?Fact Sheet for Patients:  ?EntrepreneurPulse.com.au ? ?Fact Sheet for Healthcare Providers:  ?IncredibleEmployment.be ? ?This test is no t yet approved or cleared by the [place] FDA and  ?has been authorized for detection and/or diagnosis of SARS-CoV-2 by ?FDA under an Emergency Use Authorization (EUA). This EUA will remain  ?in effect (meaning this test can be used) for the duration of the ?COVID-19 declaration under Section 564(b)(1) of the Act, 21 ?U.S.C.section 360bbb-3(b)(1), unless the authorization is terminated  ?or revoked sooner.  ? ? ?  ? Influenza A by PCR NEGATIVE NEGATIVE Final  ? Influenza B by PCR NEGATIVE NEGATIVE Final  ?  Comment: (NOTE) ?The Xpert Xpress SARS-CoV-2/FLU/RSV plus assay is intended as an aid ?in the diagnosis of influenza from Nasopharyngeal swab specimens and ?should not be used as a sole basis for treatment. Nasal washings and ?aspirates are unacceptable for Xpert Xpress SARS-CoV-2/FLU/RSV ?testing. ? ?Fact Sheet for Patients: ?EntrepreneurPulse.com.au ? ?Fact Sheet for Healthcare Providers: ?IncredibleEmployment.be ? ?This test is not yet approved or cleared by the [place] FDA and ?has been authorized for detection and/or diagnosis of SARS-CoV-2 by ?FDA under an Emergency Use Authorization (EUA). This EUA will remain ?in effect (meaning this test can be used) for  the duration of the ?COVID-19 declaration under Section 564(b)(1) of the Act, 21 U.S.C. ?section 360bbb-3(b)(1), unless the authorization is terminated or ?revoked. ? ?Performed at [facility], [place], ?[place] [address] ?  ?MRSA Next Gen  by PCR, Nasal     Status: None  ? Collection Time: 02/21/22 12:44 AM  ? Specimen: Nasal Mucosa; Nasal Swab  ?Result Value Ref Range Status  ? MRSA by PCR Next Gen NOT DETECTED NOT DETECTED Final  ?  Comment: (NOTE) ?The GeneXpert MRSA Assay (FDA approved for NASAL specimens only), ?is one component of a comprehensive MRSA colonization surveillance ?program. It is not intended to diagnose MRSA infection nor to guide ?or monitor treatment for MRSA infections. ?Test performance is not FDA approved in patients less than 2 years ?old. ?Performed at [facility], [place], ?[place] [address] ?  ?Gastrointestinal Panel by PCR , Stool     Status: Abnormal  ? Collection Time: 02/21/22 12:44 AM  ? Specimen: Stool  ?Result Value Ref Range Status  ? Campylobacter species NOT DETECTED NOT DETECTED Final  ? Plesimonas shigelloides NOT DETECTED NOT DETECTED Final  ? Salmonella species NOT DETECTED NOT DETECTED Final  ? Yersinia enterocolitica NOT DETECTED NOT DETECTED Final  ? Vibrio species NOT DETECTED NOT DETECTED Final  ? Vibrio cholerae NOT DETECTED NOT DETECTED Final  ? Enteroaggregative E coli (EAEC) NOT DETECTED NOT DETECTED Final  ? Enteropathogenic E coli (EPEC) NOT DETECTED NOT DETECTED Final  ? Enterotoxigenic E coli (ETEC) NOT DETECTED NOT DETECTED Final  ? Shiga like toxin producing E coli (STEC) NOT DETECTED NOT DETECTED Final  ? Shigella/Enteroinvasive E coli (EIEC) NOT DETECTED NOT DETECTED Final  ? Cryptosporidium NOT DETECTED NOT DETECTED Final  ? Cyclospora cayetanensis NOT DETECTED NOT DETECTED Final  ? Entamoeba histolytica NOT DETECTED NOT DETECTED Final  ? Giardia lamblia NOT DETECTED NOT DETECTED Final  ? Adenovirus F40/41 NOT DETECTED NOT DETECTED Final  ? Astrovirus NOT DETECTED NOT DETECTED Final  ? Norovirus GI/GII NOT DETECTED NOT DETECTED Final  ? Rotavirus A NOT DETECTED NOT DETECTED Final  ? Sapovirus (I, II, IV, and V) DETECTED (A) NOT DETECTED Final  ?  Comment: Performed  at [facility], [address], [place], [place] [address]  ?C Difficile Quick Screen w PCR reflex     Status: Abnormal  ? Collection Time: 02/21/22 12:44 AM  ? Specimen: STOOL  ?Result Value Ref Range Status  ? C Diff antigen POSITIVE (A) NEGATIVE Final  ? C Diff toxin POSITIVE (A) NEGATIVE Final  ? C Diff interpretation Toxin producing C. difficile detected.  Final  ?  Comment: CRITICAL RESULT CALLED TO, READ BACK BY AND VERIFIED WITH: ?MARIA BROWN @ 0154 ON 02/21/2022.Marland KitchenMarland KitchenTKR ?Performed at [facility], [address] [place], [place] [address] ?  ? ?Studies/Results: ?DG Chest Portable 1 View ? ?Result Date: 02/20/2022 ?CLINICAL DATA:  DOE, abdominal ascites EXAM: PORTABLE CHEST 1 VIEW COMPARISON:  February 12, 2022 chest x-ray. FINDINGS: Low lung volumes. Linear bibasilar opacities, compatible with subsegmental atelectasis. No confluent consolidation. No visible pleural effusions or pneumothorax. Cardiomediastinal silhouette is similar. Polyarticular degenerative change. IMPRESSION: Low lung volumes with subsegmental bibasilar atelectasis. Electronically Signed   By: Margaretha Sheffield M.D.   On: 02/20/2022 14:42   ?Medications: I have reviewed the patient's current medications. ?Scheduled Meds: ? allopurinol  200 mg Oral Daily  ? amiodarone  200 mg Oral Daily  ? cholecalciferol  2,000 Units Oral  Daily  ? iron polysaccharides  150 mg Oral Daily  ? levothyroxine  200 mcg Oral q morning  ? midodrine  10 mg Oral TID WC  ? multivitamin-lutein   Oral Daily  ? pantoprazole (PROTONIX) IV  40 mg Intravenous Q24H  ? sertraline  50 mg Oral Daily  ? sodium chloride flush  3 mL Intravenous Q12H  ? vancomycin  125 mg Oral QID  ? ?Continuous Infusions: ? sodium chloride    ? sodium chloride 40 mL/hr at 02/21/22 0538  ? cefTRIAXone (ROCEPHIN)  IV    ? ?PRN Meds:.sodium chloride, acetaminophen, ondansetron **OR** ondansetron (ZOFRAN) IV, sodium chloride flush ? ? ?  Latest Ref Rng & Units 02/21/2022  ?  3:42 AM  02/20/2022  ?  2:08 PM 02/12/2022  ? 12:02 AM  ?CBC  ?WBC 4.0 - 10.5 K/uL 17.9   18.1   12.0    ?Hemoglobin 12.0 - 15.0 g/dL 9.4   10.6   10.4    ?Hematocrit 36.0 - 46.0 % 29.1   33.8   34.3    ?Platelet

## 2022-02-21 NOTE — Progress Notes (Signed)
CRITICAL VALUE STICKER ? ?CRITICAL VALUE: C.Diff positive ? ?RECEIVER (on-site recipient of call): Verdis Frederickson, RN ? ?DATE & TIME NOTIFIED: 0153 4/26 ? ?MESSENGER (representative from lab): Lorna Dibble ? ?MD NOTIFIED: Neomia Glass, NP ? ?TIME OF NOTIFICATION: 4/26 0208 ? ?RESPONSE: NP placed PO vanco orders ?  ?

## 2022-02-21 NOTE — Assessment & Plan Note (Signed)
Recent EGD no bleeding varices, GI following  ?

## 2022-02-21 NOTE — Procedures (Signed)
PROCEDURE SUMMARY: ? ?Successful ultrasound guided paracentesis from the RUQ. ?Yielded 6.6 L of clear yellow fluid.  ?No immediate complications.  ?The patient tolerated the procedure well.  ? ?Specimen was sent for labs. ? ?EBL <  77m ? ?The patient has required >/=2 paracenteses in a 30 day period and a [facility] has been arranged. ? ?MTsosie BillingD, PA-C ?02/21/2022, 4:37 PM ? ? ? ?

## 2022-02-21 NOTE — Assessment & Plan Note (Addendum)
?   Continue po iron and monitor CBC ?

## 2022-02-21 NOTE — Assessment & Plan Note (Addendum)
?   Reduced dose of allopurinol from 200 to 100 mg daily given renal fxn  ?

## 2022-02-21 NOTE — Assessment & Plan Note (Addendum)
?   Unable to be on ASA/Statin d/t bleed risk and liver risk respectively ?

## 2022-02-21 NOTE — TOC Benefit Eligibility Note (Addendum)
Patient Advocate Encounter ? ?Insurance verification completed.   ? ?The patient is currently admitted and upon discharge could be taking Dificid 200 mg tablets. ? ?The current 10 day co-pay is, $1,494.64.  ? ?The patient is currently admitted and upon discharge could be taking Vancomycin 125 mg capsules. ? ?The current 10 day co-pay is, $99.85.  ? ?The patient is insured through [employer] Part D  ? ? ?Lyndel Safe, CPhT ?Pharmacy Patient Advocate Specialist ?[place] Patient Advocate Team ?Direct Number: [phone]  Fax: [phone] ? ? ? ? ? ?  ?

## 2022-02-21 NOTE — Assessment & Plan Note (Addendum)
Started on po vancomycin 02/20/22 ?C Diff likely contributing to leukocytosis, which is minimally trending down, monitor CBC ?Also likely contributing to volume depletion and subsequent AKI  ?

## 2022-02-21 NOTE — Progress Notes (Signed)
? ? ?MEDICINE DAILY PROGRESS NOTE ? ?Patient: Diane Pruitt 71 y.o. female ?MRN: [id number] ? ?Today is hospital day 1 after presenting to ED on 02/20/2022  2:00 PM with abdominal swelling  ? ? ?[place] COURSE: ?02/20/22 to ED w/ abdominal swelling, came to ED from rehab close by, via EMS which was called by the rehab facility. 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 Patient was recently seen at [facility] ER and had paracentesis with drainage of 4 - 5 L of ascitic fluid about a week prior to admission. Patient complains of dyspnea with exertion which is new and nausea with coffee-ground emesis 1 day prior to her admission. Chart review shows that she was admitted at [facility] from 04/03 through 04/06 for upper GI bleed and had an upper endoscopy which showed normal esophagus and portal hypertensive gastropathy. She denies having any abdominal pain, no fever, no chills, no cough, no headache, no dizziness, no lightheadedness, no changes in her bowel habits, no focal deficit or blurred vision. Found to be AKI w/ Possible hepatorenal syndrome, CDiff (+) ? ?Procedures and Significant Results:  ?Recent paracentesis 02/12/22 yielding 6.3 liters, also had paracentesis 01/04/22 ?Recent EGD 01/05/22 showed normal esophagus and portal hypertensive gastropathy ?Labs on admission 02/20/22:  ?AKI w/ Cr 2.66 --> 2.57 02/21/22  ?Hyponatremia w/ Sodium 129 --> 133 02/21/22  ?Hyperkalemia w/ K 5.2 --> 5.4 02/21/22  ?Leukocytosis w/ WBC 18.1 --> 17.9 02/21/22  ?Anemia w/ Hgb 10.6 --> 9.4 02/21/22  ?Recent TSH 02/12/22 was 13.1 ?Elevated liver enz c/w her cirrhosis  ? ? ?Consultants:  ?Gastroenterology 02/20/22: initial consult notes MELD score 25, concern for possible hepatorenal syndrome, rehydration to treat AKI will  likely worsen ascites, recommended albumin w/ paracentesis to avoid worsening renal damage, eval for SBP, stool panel ordered ?IR for paracentesis which is planned for today 02/21/22  ? ? ? ?SUBJECTIVE:  ?Pt seen and examined at bedside, no concerns today other than feeling tired and still having some swelling in abdomen. No CP/SOB. No dizziness/lightheaded.  ? ? ? ? ?ASSESSMENT & PLAN ? ? 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 ?She presented 02/20/22 for evaluation of worsening ascites without abdominal pain. ?Emesis w/ bilious material, coffee grounds material ?IV Protonix for GI prophylaxis ?We will place patient on Rocephin  ?Hemoglobin stable at admission - held off on octreotide for now ?Consult IR for paracentesis  ?GI recs appreciated ? ?AKI (acute kidney injury) ([place]) ?Patient has a baseline serum creatinine of 1.3 and 02/20/22 at admission it was 2.66, today 02/21/22 minimal improvement to 2.57 ?She had massive ascites but also intracellular fluid depletion ?Chart review shows she was hypotensive after her last paracentesis at [facility]. ?AKI may be secondary to GI losses from diarrhea versus hepatorenal syndrome ?Gentle IV fluid hydration, also receiving albumin  ?Repeat renal parameters in a.m. and if not improving may need to involve nephrology ? ?Clostridium difficile infection ?Likely explains leukocytosis and AKI ?Started on po vancomycin overnight 02/20/22 ? ?Paroxysmal atrial fibrillation (HCC) ?Not on anticoagulation due to recent bleeding ?Continue Amiodarone for rate control ? ?Hyponatremia ?Most likely related to volume overload ?Will monitor closely ? ?Depression ?Stable ?Continue sertraline ? ?Acquired hypothyroidism ?Stable ?Continue Synthroid ? ?Hypercoagulable state ([place]) ?No Rx  VTE ppx  ? ?Chronic gouty arthritis ?continue allopurinol  ? ?History of embolic  stroke ?Unable to be on ASA/Statin d/t bleed risk and liver risk respectively ? ?Portal hypertensive gastropathy (HCC) ?Recent EGD no bleeding varices, GI following  ? ?Iron deficiency anemia ?Continue po iron and monitor CBC ?Hgb drop slightly today possible dilution effect from rehydration to treat AKI, continue to monitor  ? ? ?VTE Ppx: not on Rx d/t bleed risk and recent GI bleed ?CODE STATUS: FULL ?Admitted from: rehab facility to treat alcohol dependence  ?Expected Dispo: back to rehab facility  ?Barriers to discharge: needs paracentesis and w/ hx hypotension after these procedures will need close monitoring, will also need to improve/resolve AKI vs determine if hepatorenal syndrome is a factor  ?Family communication: will reach out to family later today once paracentesis completed  ? ? ? ? ? ? ? ? ? ? ? ? ? ?Objective: ?Vital signs in last 24 hours: ?Temp:  [97 ?F (36.1 ?C)-98.1 ?F (36.7 ?C)] 98.1 ?F (36.7 ?C) (04/26 0447) ?Pulse Rate:  [65-70] 65 (04/26 0447) ?Resp:  [16-20] 18 (04/26 0447) ?BP: (107-128)/(51-61) 107/61 (04/26 0447) ?SpO2:  [92 %-95 %] 95 % (04/26 0447) ?Weight:  [77.1 kg] 77.1 kg (04/25 1404) ?Weight change:  ?Last BM Date : 02/20/22 ? ?Intake/Output from previous day: ?04/25 0701 - 04/26 0700 ?In: 930.4 [P.O.:360; I.V.:423; IV Piggyback:147.4] ?Out: 150 [Urine:150] ?Intake/Output this shift: ?No intake/output data recorded. ? ?Constitutional:  ?General Appearance: alert, well-developed, well-nourished, NAD ?Eyes: ?Normal lids and conjunctive ?Neck: ?No masses, trachea midline ?Respiratory: ?Normal respiratory effort ?Breath sounds normal, no wheeze/rhonchi/rales ?Cardiovascular: ?S1/S2 normal, no murmur/rub/gallop auscultated ?No lower extremity edema ?Gastrointestinal: ?Nontender ?Distended but no guarding/rebound ?No hernia appreciated ?Musculoskeletal:  ?No clubbing/cyanosis of digits ?Neurological: ?No cranial nerve deficit on limited exam ?Motor intact and  symmetric ?Psychiatric: ?Normal judgment/insight ?Normal mood and affect ? ? ?Lab Results: ?Recent Labs  ?  02/20/22 ?1408 02/21/22 ?0342  ?WBC 18.1* 17.9*  ?HGB 10.6* 9.4*  ?HCT 33.8* 29.1*  ?PLT 305 241  ? ?BMET ?Recent Labs  ?  02/20/22 ?1408 02/21/22 ?0342  ?NA 129* 133*  ?K 5.2* 5.4*  ?CL 102 103  ?CO2 19* 21*  ?GLUCOSE 124* 80  ?BUN 45* 44*  ?CREATININE 2.66* 2.57*  ?CALCIUM 8.5* 8.3*  ? ? ?Studies/Results: ?DG Chest Portable 1 View ? ?Result Date: 02/20/2022 ?CLINICAL DATA:  DOE, abdominal ascites EXAM: PORTABLE CHEST 1 VIEW COMPARISON:  February 12, 2022 chest x-ray. FINDINGS: Low lung volumes. Linear bibasilar opacities, compatible with subsegmental atelectasis. No confluent consolidation. No visible pleural effusions or pneumothorax. Cardiomediastinal silhouette is similar. Polyarticular degenerative change. IMPRESSION: Low lung volumes with subsegmental bibasilar atelectasis. Electronically Signed   By: Margaretha Sheffield M.D.   On: 02/20/2022 14:42   ? ?Medications: Prior to Admission:  ?Medications Prior to Admission  ?Medication Sig Dispense Refill Last Dose  ? alendronate (FOSAMAX) 70 MG tablet Take 70 mg by mouth every Monday.   02/19/2022  ? allopurinol (ZYLOPRIM) 100 MG tablet Take 200 mg by mouth daily.   02/20/2022  ? amiodarone (PACERONE) 200 MG tablet Take 200 mg by mouth in the morning and at bedtime.   3 02/20/2022  ? Cholecalciferol (VITAMIN D) 50 MCG (2000 UT) tablet Take 2,000 Units by mouth daily.   02/20/2022  ? iron polysaccharides (NIFEREX) 150 MG capsule Take 1 capsule (150 mg total) by mouth daily. 30 capsule 1 02/20/2022  ? levothyroxine (SYNTHROID) 200 MCG tablet Take 200 mcg by mouth  every morning.   02/20/2022  ? Multiple Vitamin (MULTIVITAMIN) tablet Take 1 tablet by mouth daily.   02/20/2022  ? Multiple Vitamins-Minerals (PRESERVISION AREDS 2 PO) Take 1 capsule by mouth daily. Preservision AREDS-2 250 mg- 90 mg- 40 mg-1 mg   02/20/2022  ? sertraline (ZOLOFT) 50 MG tablet Take 50 mg by mouth  daily.   02/19/2022  ? acetaminophen (TYLENOL) 500 MG tablet Take 1,000 mg by mouth every 6 (six) hours as needed for headache or mild pain.   prn  ? midodrine (PROAMATINE) 10 MG tablet Take 1 tablet (10 mg total) by mouth 3 (three) tim

## 2022-02-21 NOTE — Assessment & Plan Note (Addendum)
?   No Rx VTE ppx d/t risk bleed ?

## 2022-02-21 NOTE — Progress Notes (Signed)
?   02/21/22 1300  ?Clinical Encounter Type  ?Visited With Health care provider  ?Visit Type Initial  ?Referral From Nurse  ? ?Chaplain followed up on request for Advance Directive. ?

## 2022-02-21 NOTE — Progress Notes (Signed)
Initial Nutrition Assessment ? ?DOCUMENTATION CODES:  ? ?Obesity unspecified ? ?INTERVENTION:  ? ?-Boost Breeze po TID, each supplement provides 250 kcal and 9 grams of protein  ?-30 ml Prosource Plus TID, each supplement provides 100 kcals and 15 grams protein ?-MVI with minerals daily ?-RD will follow for diet advancement and adjust supplement regimen as appopriate ? ?NUTRITION DIAGNOSIS:  ? ?Increased nutrient needs related to chronic illness (cirrhosis) as evidenced by estimated needs. ? ?GOAL:  ? ?Patient will meet greater than or equal to 90% of their needs ? ?MONITOR:  ? ?PO intake, Supplement acceptance, Diet advancement, Labs, Weight trends, Skin, I & O's ? ?REASON FOR ASSESSMENT:  ? ?Malnutrition Screening Tool ?  ? ?ASSESSMENT:  ? ?Diane Pruitt is a 71 y.o. 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  Patient was recently seen at [facility] ER and had paracentesis with drainage of 4 - 5 L of ascitic fluid about a week ago. ? ?Pt admitted with cirrhosis of liver with portal hypertension.  ? ?Reviewed I/O's: +780 ml x 25 hours ? ?UOP: 150 ml x 24 hours ? ?Per GI notes, plan for paracentesis today.  ? ?Spoke with pt at bedside, who reports decreased appetite over the past month since last hospitalization. Pt explains that she lives in a SNF and has not eaten much other than bites due to poor taste of food at SNF and early satiety secondary to ascites. Pt reports that her intake typically improves when she undergoes paracentesis. She did not eat anything this morning; noted breakfast tray on table, which was untouched. Pt shares that she tried Ensure supplements during last hospitalization, but they caused her diarrhea.  ? ?Pt unsure if she has lost weight.  Reviewed wt hx; pt has experienced a 6.3% wt loss over the past 6  weeks, which is not significant for time frame. Suspect true weight loss may be masked secondary to ascites.  ? ?Discussed importance of good meal and supplement intake to promote healing. Pt amenable to try Boost Breeze and Prosource in place of Ensure.  ?  ?Medications reviewed and include vitamin D3 and 0.9% sodium chloride infusion @ 40 ml/hr.   ? ?Lab Results  ?Component Value Date  ? HGBA1C 4.5 (L) 01/22/2022  ? PTA DM medications are none.  ? ?Labs reviewed: Na: 133, K: 5.4, CBGS: 129 (inpatient orders for glycemic control are none).   ? ?NUTRITION - FOCUSED PHYSICAL EXAM: ? ?Flowsheet Row Most Recent Value  ?Orbital Region No depletion  ?Upper Arm Region No depletion  ?Thoracic and Lumbar Region No depletion  ?Buccal Region No depletion  ?Temple Region No depletion  ?Clavicle Bone Region No depletion  ?Clavicle and Acromion Bone Region No depletion  ?Scapular Bone Region No depletion  ?Dorsal Hand No depletion  ?Patellar Region No depletion  ?Anterior Thigh Region No depletion  ?Posterior Calf Region No depletion  ?Edema (RD Assessment) None  ?Hair Reviewed  ?Eyes Reviewed  ?Mouth Reviewed  ?Skin Reviewed  ?Nails Reviewed  ? ?  ? ? ?Diet Order:   ?Diet Order   ? ?       ?  Diet clear liquid Room service appropriate? Yes; Fluid consistency: Thin  Diet effective now       ?  ? ?  ?  ? ?  ? ? ?  EDUCATION NEEDS:  ? ?Education needs have been addressed ? ?Skin:  Skin Assessment: Reviewed RN Assessment ? ?Last BM:  02/21/22 ? ?Height:  ? ?Ht Readings from Last 1 Encounters:  ?02/20/22 '5\' 2"'$  (1.575 m)  ? ? ?Weight:  ? ?Wt Readings from Last 1 Encounters:  ?02/20/22 77.1 kg  ? ? ?Ideal Body Weight:  50 kg ? ?BMI:  Body mass index is 31.09 kg/m?. ? ?Estimated Nutritional Needs:  ? ?Kcal:  1750-1950 ? ?Protein:  100-115 grams ? ?Fluid:  > 1.7 L ? ? ? ?Loistine Chance, RD, LDN, CDCES ?Registered Dietitian II ?Certified Diabetes Care and Education  Specialist ?Please refer to [facility] for RD and/or RD on-call/weekend/after hours pager  ?

## 2022-02-22 ENCOUNTER — Inpatient Hospital Stay: Payer: Medicare HMO

## 2022-02-22 DIAGNOSIS — I81 Portal vein thrombosis: Secondary | ICD-10-CM

## 2022-02-22 DIAGNOSIS — E039 Hypothyroidism, unspecified: Secondary | ICD-10-CM | POA: Diagnosis not present

## 2022-02-22 DIAGNOSIS — K7460 Unspecified cirrhosis of liver: Secondary | ICD-10-CM | POA: Diagnosis not present

## 2022-02-22 DIAGNOSIS — R8290 Unspecified abnormal findings in urine: Secondary | ICD-10-CM | POA: Diagnosis not present

## 2022-02-22 DIAGNOSIS — K7031 Alcoholic cirrhosis of liver with ascites: Secondary | ICD-10-CM

## 2022-02-22 DIAGNOSIS — N179 Acute kidney failure, unspecified: Secondary | ICD-10-CM | POA: Diagnosis not present

## 2022-02-22 DIAGNOSIS — E875 Hyperkalemia: Secondary | ICD-10-CM

## 2022-02-22 LAB — COMPREHENSIVE METABOLIC PANEL
ALT: 71 U/L — ABNORMAL HIGH (ref 0–44)
AST: 172 U/L — ABNORMAL HIGH (ref 15–41)
Albumin: 2.5 g/dL — ABNORMAL LOW (ref 3.5–5.0)
Alkaline Phosphatase: 106 U/L (ref 38–126)
Anion gap: 7 (ref 5–15)
BUN: 41 mg/dL — ABNORMAL HIGH (ref 8–23)
CO2: 19 mmol/L — ABNORMAL LOW (ref 22–32)
Calcium: 8 mg/dL — ABNORMAL LOW (ref 8.9–10.3)
Chloride: 108 mmol/L (ref 98–111)
Creatinine, Ser: 2.35 mg/dL — ABNORMAL HIGH (ref 0.44–1.00)
GFR, Estimated: 22 mL/min — ABNORMAL LOW (ref >60)
Glucose, Bld: 76 mg/dL (ref 70–99)
Potassium: 4.3 mmol/L (ref 3.5–5.1)
Sodium: 134 mmol/L — ABNORMAL LOW (ref 135–145)
Total Bilirubin: 0.9 mg/dL (ref 0.3–1.2)
Total Protein: 5.8 g/dL — ABNORMAL LOW (ref 6.5–8.1)

## 2022-02-22 LAB — PROTEIN, BODY FLUID (OTHER): Total Protein, Body Fluid Other: 1 g/dL

## 2022-02-22 LAB — CBC
HCT: 28.5 % — ABNORMAL LOW (ref 36.0–46.0)
Hemoglobin: 9.1 g/dL — ABNORMAL LOW (ref 12.0–15.0)
MCH: 25.8 pg — ABNORMAL LOW (ref 26.0–34.0)
MCHC: 31.9 g/dL (ref 30.0–36.0)
MCV: 80.7 fL (ref 80.0–100.0)
Platelets: 211 10*3/uL (ref 150–400)
RBC: 3.53 MIL/uL — ABNORMAL LOW (ref 3.87–5.11)
RDW: 20.4 % — ABNORMAL HIGH (ref 11.5–15.5)
WBC: 17.3 10*3/uL — ABNORMAL HIGH (ref 4.0–10.5)
nRBC: 0 % (ref 0.0–0.2)

## 2022-02-22 MED ORDER — ALBUMIN HUMAN 25 % IV SOLN
25.0000 g | Freq: Three times a day (TID) | INTRAVENOUS | Status: DC
  Administered 2022-02-22 – 2022-02-23 (×3): 25 g via INTRAVENOUS
  Filled 2022-02-22 (×3): qty 100

## 2022-02-22 MED ORDER — ALBUMIN HUMAN 25 % IV SOLN
25.0000 g | Freq: Three times a day (TID) | INTRAVENOUS | Status: DC
  Filled 2022-02-22: qty 100

## 2022-02-22 MED ORDER — ALLOPURINOL 100 MG PO TABS
100.0000 mg | ORAL_TABLET | Freq: Every day | ORAL | Status: DC
  Administered 2022-02-22 – 2022-02-23 (×2): 100 mg via ORAL
  Filled 2022-02-22 (×2): qty 1

## 2022-02-22 NOTE — Consult Note (Incomplete)
?Diane Pruitt  ?[phone] [phone] ? ?ID: Diane Pruitt OB: 15-Mar-1951  MR#: [id number]  CNO#:[id number] ? ?Patient Care Team: ?[name], MD as PCP - General (Family Medicine) ? ?CHIEF COMPLAINT: Portal vein thrombosis. ? ?INTERVAL HISTORY: *** ? ?REVIEW OF SYSTEMS:   ?ROS ? ?As per HPI. Otherwise, a complete review of systems is negative. ? ?PAST MEDICAL HISTORY: ?Past Medical History:  ?Diagnosis Date  ? Atrial fibrillation ([place])   ? Barrett esophagus   ? Chronic kidney disease (CKD), stage III (moderate) (HCC)   ? Cirrhosis ([place])   ? Dysrhythmia   ? Atrial Fibrillation 2 years ago  ? Hypertension   ? Ischemic stroke ([place]) 01/25/2018  ? Stroke [facility])   ? ? ?PAST SURGICAL HISTORY: ?Past Surgical History:  ?Procedure Laterality Date  ? CESAREAN SECTION    ? ESOPHAGOGASTRODUODENOSCOPY (EGD) WITH PROPOFOL N/A 01/05/2022  ? Procedure: ESOPHAGOGASTRODUODENOSCOPY (EGD) WITH PROPOFOL;  Surgeon: Ronnette Juniper, MD;  Location: [facility];  Service: Gastroenterology;  Laterality: N/A;  ? EYE SURGERY    ? INTRAOCULAR LENS INSERTION Bilateral About 3 years ago  ? IR PARACENTESIS  02/12/2022  ? KYPHOPLASTY N/A 02/18/2020  ? Procedure: KYPHOPLASTY L2;  Surgeon: Melina Schools, MD;  Location: [place];  Service: Orthopedics;  Laterality: N/A;  60 mins  ? TONSILLECTOMY    ? TUBAL LIGATION    ? ? ?FAMILY HISTORY: ?Family History  ?Problem Relation Age of Onset  ? Hypertension Mother   ? Hyperlipidemia Mother   ? Atrial fibrillation Mother   ? Hyperlipidemia Father   ? Atrial fibrillation Father   ? ? ?ADVANCED DIRECTIVES (Y/N):  '@ADVDIR'$ @ ? ?HEALTH MAINTENANCE: ?Social History  ? ?Tobacco Use  ? Smoking status: Never  ? Smokeless tobacco: Never  ?Vaping Use  ? Vaping Use: Never used  ?Substance Use Topics  ? Alcohol use: Never  ? Drug use: Never  ? ? ? Colonoscopy: ? PAP: ? Bone density: ? Lipid panel: ? ?Allergies  ?Allergen Reactions  ? Colchicine Other (See Comments)  ?  Stomach upset,  diarrhea ?Other reaction(s): stomach upset  ? ? ?Current Facility-Administered Medications  ?Medication Dose Route Frequency Provider Last Rate Last Admin  ? (feeding supplement) PROSource Plus liquid 30 mL  30 mL Oral TID BM Emeterio Reeve, DO   30 mL at 02/22/22 1402  ? 0.9 %  sodium chloride infusion  250 mL Intravenous PRN Agbata, Tochukwu, MD      ? 0.9 %  sodium chloride infusion   Intravenous Continuous Agbata, Tochukwu, MD 40 mL/hr at 02/22/22 0510 Infusion Verify at 02/22/22 0510  ? acetaminophen (TYLENOL) tablet 1,000 mg  1,000 mg Oral Q6H PRN Agbata, Tochukwu, MD   1,000 mg at 02/22/22 1306  ? albumin human 25 % solution 25 g  25 g Intravenous Q8H Belue, Alver Sorrow, RPH      ? allopurinol (ZYLOPRIM) tablet 100 mg  100 mg Oral Daily Emeterio Reeve, DO   100 mg at 02/22/22 1026  ? amiodarone (PACERONE) tablet 200 mg  200 mg Oral Daily Agbata, Tochukwu, MD   200 mg at 02/22/22 1026  ? cholecalciferol (VITAMIN D3) tablet 2,000 Units  2,000 Units Oral Daily Agbata, Tochukwu, MD   2,000 Units at 02/22/22 1025  ? feeding supplement (BOOST / RESOURCE BREEZE) liquid 1 Container  1 Container Oral TID BM Emeterio Reeve, DO   1 Container at 02/22/22 1401  ? iron polysaccharides (NIFEREX) capsule 150 mg  150 mg Oral Daily Agbata, Tochukwu,  MD   150 mg at 02/22/22 1025  ? levothyroxine (SYNTHROID) tablet 200 mcg  200 mcg Oral q morning Agbata, Tochukwu, MD   200 mcg at 02/22/22 0500  ? midodrine (PROAMATINE) tablet 10 mg  10 mg Oral TID WC Agbata, Tochukwu, MD   10 mg at 02/22/22 1242  ? multivitamin with minerals tablet 1 tablet  1 tablet Oral Daily Emeterio Reeve, DO   1 tablet at 02/22/22 1025  ? ondansetron (ZOFRAN) tablet 4 mg  4 mg Oral Q6H PRN Agbata, Tochukwu, MD      ? Or  ? ondansetron (ZOFRAN) injection 4 mg  4 mg Intravenous Q6H PRN Agbata, Tochukwu, MD      ? pantoprazole (PROTONIX) injection 40 mg  40 mg Intravenous Q24H Agbata, Tochukwu, MD   40 mg at 02/21/22 1730  ? sertraline (ZOLOFT)  tablet 50 mg  50 mg Oral Daily Agbata, Tochukwu, MD   50 mg at 02/22/22 1026  ? sodium chloride flush (NS) 0.9 % injection 3 mL  3 mL Intravenous Q12H Agbata, Tochukwu, MD   3 mL at 02/21/22 2132  ? sodium chloride flush (NS) 0.9 % injection 3 mL  3 mL Intravenous PRN Agbata, Tochukwu, MD      ? vancomycin (VANCOCIN) capsule 125 mg  125 mg Oral QID Foust, Katy L, NP   125 mg at 02/22/22 1401  ? ? ?OBJECTIVE: ?Vitals:  ? 02/22/22 0820 02/22/22 1213  ?BP: (!) 93/44 (!) 96/52  ?Pulse: 65 67  ?Resp: 20 20  ?Temp: (!) 97.5 ?F (36.4 ?C) 98 ?F (36.7 ?C)  ?SpO2: 94% 93%  ?   Body mass index is 31.09 kg/m?Marland Kitchen    ECOG FS:{CHL ONC [id number]} ? ?General: Well-developed, well-nourished, no acute distress. ?Eyes: Pink conjunctiva, anicteric sclera. ?HEENT: Normocephalic, moist mucous membranes. ?Lungs: No audible wheezing or coughing. ?Heart: Regular rate and rhythm. ?Abdomen: Soft, nontender, no obvious distention. ?Musculoskeletal: No edema, cyanosis, or clubbing. ?Neuro: Alert, answering all questions appropriately. Cranial nerves grossly intact. ?Skin: No rashes or petechiae noted. ?Psych: Normal affect. ?Lymphatics: No cervical, calvicular, axillary or inguinal LAD. ? ? ?LAB RESULTS: ? ?Lab Results  ?Component Value Date  ? NA 134 (L) 02/22/2022  ? K 4.3 02/22/2022  ? CL 108 02/22/2022  ? CO2 19 (L) 02/22/2022  ? GLUCOSE 76 02/22/2022  ? BUN 41 (H) 02/22/2022  ? CREATININE 2.35 (H) 02/22/2022  ? CALCIUM 8.0 (L) 02/22/2022  ? PROT 5.8 (L) 02/22/2022  ? ALBUMIN 2.5 (L) 02/22/2022  ? AST 172 (H) 02/22/2022  ? ALT 71 (H) 02/22/2022  ? ALKPHOS 106 02/22/2022  ? BILITOT 0.9 02/22/2022  ? GFRNONAA 22 (L) 02/22/2022  ? GFRAA >60 02/15/2020  ? ? ?Lab Results  ?Component Value Date  ? WBC 17.3 (H) 02/22/2022  ? NEUTROABS 15.6 (H) 02/20/2022  ? HGB 9.1 (L) 02/22/2022  ? HCT 28.5 (L) 02/22/2022  ? MCV 80.7 02/22/2022  ? PLT 211 02/22/2022  ? ? ? ?STUDIES: ?CT ABDOMEN PELVIS WO CONTRAST ? ?Result Date: 01/29/2022 ?CLINICAL DATA:   71 year old female status post fall. Recently discontinued Eliquis. History of cirrhosis, ascites. Status post paracentesis on 01/04/2022. EXAM: CT ABDOMEN AND PELVIS WITHOUT CONTRAST TECHNIQUE: Multidetector CT imaging of the abdomen and pelvis was performed following the standard protocol without IV contrast. RADIATION DOSE REDUCTION: This exam was performed according to the departmental dose-optimization program which includes automated exposure control, adjustment of the mA and/or kV according to patient size and/or use of iterative reconstruction technique. COMPARISON:  CT Abdomen and Pelvis 01/03/2022 and earlier. FINDINGS: Lower chest: Stable mild to moderate cardiomegaly. No pericardial or pleural effusion. Stable mild lung base scarring and atelectasis. Hepatobiliary: Perihepatic ascites is ongoing and appears mildly increased from last month. Simple fluid density. Nodular, cirrhotic liver. No discrete liver lesion by noncontrast CT. Gallbladder is distended and difficult to delineate from adjacent ascites, similar to the appearance last month. Pancreas: Negative noncontrast pancreas. Spleen: Stable mild splenomegaly.  Adjacent ascites. Adrenals/Urinary Tract: Normal adrenal glands. Nonobstructed kidneys. Moderate volume bladder with adjacent ascites. Numerous pelvic phleboliths. Stomach/Bowel: No dilated large or small bowel. Abundant ascites. No discrete bowel inflammation is evident. No free air. Vascular/Lymphatic: Extensive Aortoiliac calcified atherosclerosis. Vascular patency is not evaluated in the absence of IV contrast. Normal caliber abdominal aorta. No lymphadenopathy identified. Reproductive: Negative. Other: Large volume simple fluid density ascites in the pelvis, slightly increased from last month. Musculoskeletal: Chronic left lateral 11th rib fracture. Previously augmented L2 compression fracture. No acute osseous abnormality identified. IMPRESSION: 1. Cirrhosis with moderate volume Ascites  which appears mildly progressed compared to the pre-paracentesis CT on 01/03/2022. 2. No other acute finding in the noncontrast abdomen and pelvis. Redemonstrated mild splenomegaly, cardiomegaly, chronic T2 com

## 2022-02-22 NOTE — Progress Notes (Signed)
Discussed with Dr. Alice Reichert, GI, if additional albumin was needed this evening as the paracentesis drained greater than 5L of fluid. Per Dr. Alice Reichert, no additional albumin needed at this time. Will continue to monitor.  ?

## 2022-02-22 NOTE — Assessment & Plan Note (Addendum)
Non-obstructing  ?? IR recommending transfer to [facility] for TIPS ?? Hold off on anticoagulation for now given bleed risk and given IR can remove the thrombosis during TIPS procedure  ?

## 2022-02-22 NOTE — Progress Notes (Signed)
? ?  Portal Hypertension Clinic Screening Evaluation ? ? ?Indication for evaluation: ?Diane Pruitt is a 71 y.o. female undergoing preliminary evaluation in the [facility] due to recurrent ascites. ? ?Referring Physician/Established Gastroenterologist:  [facility] (outpatient),  ? ?Etiology of cirrhosis: EtOH ?Initially diagnosed: Unknown ?# of paracentesis in last month: 2 ?# of paracentesis in last 2 months: 3 ?History of hepatic hydrothorax:  No ?History of hepatic encephalopathy: no ? ?Prior evaluation for liver transplant: None documented ?History of hepatocellular carcinoma: No ? ?Prior esophagogastroduodenoscopy/intervention: 01/05/22 - portal gastropathy ?Current esophageal varices: No ?Current gastric varices: No ?History of hematemesis: Coffee ground emesis on presentation for current admission ? ?Current diuretic regimen: None ?Current pharmacologic encephalopathy prophylaxis/treatment: None ? ?History of renal dysfunction: Yes, CKD3, recent AKI ?History of hemodialysis: No ? ?History of cardiac dysfunction: Atrial fibrillation, on amiodaron ? ?Other pertinent past medical history: Barrett esophagus, hypertension, ischemic stroke (2019) ? ? ?Imaging: ?Prior cross sectional imaging of portal system: ?CT AP 01/03/22 ? ?Anatomy amenable to TIPS, no portal thrombus.  Recanalized paraumbilical vein which distributes to right external iliac vein. ? ?Hepatic Duplex (02/21/22) ? ?Non occlusive, subacute thrombus at portal confluence, too-and-fro/hepatofugal flow throughout hepatic portal system. ? ? ?Echocardiogram:  ?01/15/2018 ? ? ?Labs: ?Creatinine: 2.35 ?Total Bilirubin: 0.9 ?INR: 1.3 ?Sodium: 134 ?Albumin: 2.5 ? ?Child-Pugh = 9 points, class B ?MELD = 18 (6.0% estimated 1-monthmortality) ?[place] Index of Post-TIPS Survival (FIPS) = 1.37 (Overall survival estimate at 1 month 83.5%, 3 months 56.8%, 6 months 43.9%) ? ? ? ?Assessment: ?Diane Frakesis a 71y.o. female with history of alcoholic cirrhosis (Child Pugh B, MELD 18) with recent onset recurrent ascites, worsening renal function on top of CKD3, and subacute non-occlusive portal vein thrombus.  After preliminary evaluation, this patient would likely be a candidate for TIPS creation.  Her portal thrombus is likely the etiology of new onset ascites and hepatorenal syndrome/AKI.  Anticoagulation alone may relieve thrombus, however this is unlikely due to hepatofugal/to-and-fro flow visualized on hepatic duplex.  TIPS creation alone with establishment of antegrade portal flow could relieve the thrombus over time, however catheter directed thrombectomy could be performed at time of TIPS creation pending venographic findings.  ? ?Recommendation: ?Formal consult for TIPS could be considered.  My associate, Dr. MMaryelizabeth Kaufmann has [facility] this [facility] Dr. WAllen Norris ? ? ? ?Electronically Signed: ?DSuzette Battiest MD ?02/22/2022, 11:44 AM ? ? ? ?

## 2022-02-22 NOTE — Assessment & Plan Note (Addendum)
Possible contaminant (epithelial cells present) but either way added on UCx to previously collected urine, she was on ceftriaxone for SBP Ppx which would hopefully treat most UTI ?? Ucx pending ?

## 2022-02-22 NOTE — Progress Notes (Signed)
?Diane Lame, MD [facility]   ?[address] ?[place], [place] [address] ?Phone: [phone] ?Fax : [phone] ? ? 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  The patient was reported by radiology to have a meld score of 18 although her sodium meld score is higher she is deemed to be a candidate for a TIPS procedure. ? ? ?Objective: ?Vital signs in last 24 hours: ?Vitals:  ? 02/22/22 0509 02/22/22 0820 02/22/22 1213 02/22/22 1655  ?BP: (!) 100/46 (!) 93/44 (!) 96/52 (!) 105/49  ?Pulse:  65 67 68  ?Resp:  '20 20 20  '$ ?Temp:  (!) 97.5 ?F (36.4 ?C) 98 ?F (36.7 ?C) 98.7 ?F (37.1 ?C)  ?TempSrc:  Oral Oral   ?SpO2:  94% 93% 92%  ?Weight:      ?Height:      ? ?Weight change:  ? ?Intake/Output Summary (Last 24 hours) at 02/22/2022 1855 ?Last data filed at 02/22/2022 1757 ?Gross per 24 hour  ?Intake 2349.59 ml  ?Output --  ?Net 2349.59 ml  ? ? ? ?Exam: ?General: the patient is alert and orientated ?3 in no apparent distress ? ? ?Lab Results: ?'@LABTEST2'$ @ ?Micro Results: ?Recent Results (from the past 240 hour(s))  ?Resp Panel by RT-PCR (Flu A&B, Covid) Nasopharyngeal Swab     Status: None  ? Collection Time: 02/20/22  3:03 PM  ? Specimen: Nasopharyngeal Swab; Nasopharyngeal(NP) swabs in vial transport medium  ?Result Value Ref Range Status  ? SARS Coronavirus 2 by RT PCR NEGATIVE NEGATIVE Final  ?  Comment: (NOTE) ?SARS-CoV-2 target nucleic acids are NOT DETECTED. ? ?The SARS-CoV-2 RNA is generally detectable in upper respiratory ?specimens during the acute phase of infection. The lowest ?concentration of SARS-CoV-2 viral copies this assay can detect is ?138 copies/mL. A negative result does not preclude SARS-Cov-2 ?infection and should not be used as the sole basis for treatment or ?other patient management decisions. A negative result may occur with  ?improper specimen collection/handling, submission of specimen other ?than nasopharyngeal swab, presence of  viral mutation(s) within the ?areas targeted by this assay, and inadequate number of viral ?copies(<138 copies/mL). A negative result must be combined with ?clinical observations, patient history, and epidemiological ?information. The expected result is Negative. ? ?Fact Sheet for Patients:  ?EntrepreneurPulse.com.au ? ?Fact Sheet for Healthcare Providers:  ?IncredibleEmployment.be ? ?This test is no t yet approved or cleared by the [place] FDA and  ?has been authorized for detection and/or diagnosis of SARS-CoV-2 by ?FDA under an Emergency Use Authorization (EUA). This EUA will remain  ?in effect (meaning this test can be used) for the duration of the ?COVID-19 declaration under Section 564(b)(1) of the Act, 21 ?U.S.C.section 360bbb-3(b)(1), unless the authorization is terminated  ?or revoked sooner.  ? ? ?  ? Influenza A by PCR NEGATIVE NEGATIVE Final  ? Influenza B by PCR NEGATIVE NEGATIVE Final  ?  Comment: (NOTE) ?The Xpert Xpress SARS-CoV-2/FLU/RSV plus assay is intended as an aid ?in the diagnosis of influenza from Nasopharyngeal swab specimens and ?should not be used as a sole basis for treatment. Nasal washings and ?aspirates are unacceptable for Xpert Xpress SARS-CoV-2/FLU/RSV ?testing. ? ?Fact Sheet for Patients: ?EntrepreneurPulse.com.au ? ?Fact Sheet for Healthcare Providers: ?IncredibleEmployment.be ? ?This test is not yet approved or cleared by the [place] FDA and ?has been authorized for detection and/or diagnosis of SARS-CoV-2 by ?FDA under an Emergency Use Authorization (EUA). This EUA will remain ?in effect (meaning this  test can be used) for the duration of the ?COVID-19 declaration under Section 564(b)(1) of the Act, 21 U.S.C. ?section 360bbb-3(b)(1), unless the authorization is terminated or ?revoked. ? ?Performed at [facility], [place], ?[place] [address] ?  ?MRSA Next Gen by PCR,  Nasal     Status: None  ? Collection Time: 02/21/22 12:44 AM  ? Specimen: Nasal Mucosa; Nasal Swab  ?Result Value Ref Range Status  ? MRSA by PCR Next Gen NOT DETECTED NOT DETECTED Final  ?  Comment: (NOTE) ?The GeneXpert MRSA Assay (FDA approved for NASAL specimens only), ?is one component of a comprehensive MRSA colonization surveillance ?program. It is not intended to diagnose MRSA infection nor to guide ?or monitor treatment for MRSA infections. ?Test performance is not FDA approved in patients less than 2 years ?old. ?Performed at [facility], [place], ?[place] [address] ?  ?Gastrointestinal Panel by PCR , Stool     Status: Abnormal  ? Collection Time: 02/21/22 12:44 AM  ? Specimen: Stool  ?Result Value Ref Range Status  ? Campylobacter species NOT DETECTED NOT DETECTED Final  ? Plesimonas shigelloides NOT DETECTED NOT DETECTED Final  ? Salmonella species NOT DETECTED NOT DETECTED Final  ? Yersinia enterocolitica NOT DETECTED NOT DETECTED Final  ? Vibrio species NOT DETECTED NOT DETECTED Final  ? Vibrio cholerae NOT DETECTED NOT DETECTED Final  ? Enteroaggregative E coli (EAEC) NOT DETECTED NOT DETECTED Final  ? Enteropathogenic E coli (EPEC) NOT DETECTED NOT DETECTED Final  ? Enterotoxigenic E coli (ETEC) NOT DETECTED NOT DETECTED Final  ? Shiga like toxin producing E coli (STEC) NOT DETECTED NOT DETECTED Final  ? Shigella/Enteroinvasive E coli (EIEC) NOT DETECTED NOT DETECTED Final  ? Cryptosporidium NOT DETECTED NOT DETECTED Final  ? Cyclospora cayetanensis NOT DETECTED NOT DETECTED Final  ? Entamoeba histolytica NOT DETECTED NOT DETECTED Final  ? Giardia lamblia NOT DETECTED NOT DETECTED Final  ? Adenovirus F40/41 NOT DETECTED NOT DETECTED Final  ? Astrovirus NOT DETECTED NOT DETECTED Final  ? Norovirus GI/GII NOT DETECTED NOT DETECTED Final  ? Rotavirus A NOT DETECTED NOT DETECTED Final  ? Sapovirus (I, II, IV, and V) DETECTED (A) NOT DETECTED Final  ?  Comment: Performed at  [facility], [address] [place], [place] [address]  ?C Difficile Quick Screen w PCR reflex     Status: Abnormal  ? Collection Time: 02/21/22 12:44 AM  ? Specimen: STOOL  ?Result Value Ref Range Status  ? C Diff antigen POSITIVE (A) NEGATIVE Final  ? C Diff toxin POSITIVE (A) NEGATIVE Final  ? C Diff interpretation Toxin producing C. difficile detected.  Final  ?  Comment: CRITICAL RESULT CALLED TO, READ BACK BY AND VERIFIED WITH: ?MARIA BROWN @ 0154 ON 02/21/2022.Marland KitchenMarland KitchenTKR ?Performed at [facility], [address] [place], [place] [address] ?  ?Body fluid culture w Gram Stain     Status: None (Preliminary result)  ? Collection Time: 02/21/22  3:49 PM  ? Specimen: PATH Cytology Peritoneal fluid  ?Result Value Ref Range Status  ? Specimen Description   Final  ?  PERITONEAL ?Performed at [facility], [address], [place], [place] [address] ?  ? Special Requests   Final  ?  NONE ?Performed at [facility], [address], [place],  [address] ?  ? Gram Stain NO WBC SEEN ?NO ORGANISMS SEEN ?  Final  ? Culture   Final  ?  NO GROWTH <  24 HOURS ?Performed at [facility], [place] [address] [place], [place] [address] ?  ? Report Status PENDING  Incomplete  ? ?Studies/Results: ?US RENAL ? ?Result Date: 02/22/2022 ?CLINICAL DATA:  Renal dysfunction EXAM: RENAL / URINARY TRACT ULTRASOUND COMPLETE COMPARISON:  CT done on 01/29/2022 FINDINGS: Right Kidney: Renal measurements: 9.5 x 5.2 by 4.1 cm = volume: 105 mL. Echogenicity within normal limits. No mass or hydronephrosis visualized. Left Kidney: Renal measurements: 9.2 x 4.7 x 4 cm = volume: 90 mL. Echogenicity within normal limits. No mass or hydronephrosis visualized. Bladder: Appears normal for degree of bladder distention. Other: Moderate to large ascites is present. There is nodularity in the liver surface. IMPRESSION: There is no hydronephrosis.  Moderate to large ascites. Electronically Signed   By: Elmer Picker M.D.   On: 02/22/2022 16:55  ? ?US Paracentesis ? ?Result Date: 02/21/2022 ?INDICATION: Patient with cirrhosis and recurrent ascites, request received for diagnostic and therapeutic paracentesis. EXAMRegino Schultze

## 2022-02-22 NOTE — Assessment & Plan Note (Signed)
Resolved based on BMP today 02/22/22  ?

## 2022-02-22 NOTE — TOC Initial Note (Signed)
Transition of Care (TOC) - Initial/Assessment Note  ? ? ?Patient Details  ?Name: Diane Pruitt ?MRN: [id number] ?Date of Birth: 07/20/1951 ? ?Transition of Care (TOC) CM/SW Contact:    ?Dshawn Mcnay A Havanna Groner, LCSW ?Phone Number: ?02/22/2022, 4:09 PM ? ?Clinical Narrative:   CSW spoke with tp's daughter regarding dc plan. Per daughter pt was at [facility] prior to admission and daughter is not sure how many days she has left of SNF. However, pt's daughter states pt was unable to participate in therapy many of the days she was there so she is planning on appealing if insurance declines days. CSW did let pt's daughter know after insurance stops covering because pt is out of days it would be private pay. Pt's daughter does not want her to go back to white [facility] instead she would like a rehab in [place]. Her three top are [facility] [phone]) [place] (([phone])  or [place] [phone]). CSW to send referrals and check for bed availability.           ? ? ?Expected Discharge Plan: [place] ?Barriers to Discharge: Continued Medical Work up ? ? ?Patient Goals and CMS Choice ?Patient states their goals for this hospitalization and ongoing recovery are:: for pt to go to snf ?  ?Choice offered to / list presented to : Adult Children ? ?Expected Discharge Plan and Services ?Expected Discharge Plan: [place] ?  ?  ?Post Acute Care Choice: [place] ?Living arrangements for the past 2 months: [place] ?                ?  ?  ?  ?  ?  ?  ?  ?  ?  ?  ? ?Prior Living Arrangements/Services ?Living arrangements for the past 2 months: [place] ?Lives with:: Facility Resident ?Patient language and need for interpreter reviewed:: Yes ?Do you feel safe going back to the place where you live?: Yes      ?Need for Family Participation in Patient Care: Yes (Comment) ?Care giver support  system in place?: Yes (comment) ?  ?Criminal Activity/Legal Involvement Pertinent to Current Situation/Hospitalization: No - Comment as needed ? ?Activities of Daily Living ?Home Assistive Devices/Equipment: Gilford Rile (specify type) ?ADL Screening (condition at time of admission) ?Patient's cognitive ability adequate to safely complete daily activities?: Yes ?Is the patient deaf or have difficulty hearing?: No ?Does the patient have difficulty seeing, even when wearing glasses/contacts?: No ?Does the patient have difficulty concentrating, remembering, or making decisions?: Yes ?Patient able to express need for assistance with ADLs?: Yes ?Does the patient have difficulty dressing or bathing?: Yes ?Independently performs ADLs?: No ?Communication: Independent ?Dressing (OT): Needs assistance ?Is this a change from baseline?: Change from baseline, expected to last >3 days ?Grooming: Needs assistance ?Is this a change from baseline?: Change from baseline, expected to last >3 days ?Feeding: Independent ?Bathing: Needs assistance ?Is this a change from baseline?: Change from baseline, expected to last >3 days ?Toileting: Needs assistance ?Is this a change from baseline?: Change from baseline, expected to last >3days ?In/Out Bed: Needs assistance ?Is this a change from baseline?: Change from baseline, expected to last >3 days ?Walks in Home: Needs assistance ?Is this a change from baseline?: Change from baseline, expected to last >3 days ?Does the patient have difficulty walking or climbing stairs?: Yes ?Weakness of Legs: Both ?Weakness of Arms/Hands: Both ? ?Permission Sought/Granted ?Permission sought  to share information with : Family Supports ?Permission granted to share information with : Yes, Verbal Permission Granted ? Share Information with NAME: [name] ?   ? Permission granted to share info w Relationship: daughter ?   ? ?Emotional Assessment ?Appearance:: Appears stated age ?Attitude/Demeanor/Rapport: Engaged ?   ?Orientation: : Oriented to Self, Oriented to Place, Oriented to  Time, Oriented to Situation ?Alcohol / Substance Use: Not Applicable ?Psych Involvement: No (comment) ? ?Admission diagnosis:  Lower abdominal pain [R10.30] ?Abdominal pain [R10.9] ?Ascites due to alcoholic cirrhosis ([place]) [U27.25] ?Patient Active Problem List  ? Diagnosis Date Noted  ? Hyperkalemia 02/22/2022  ? Abnormal urinalysis 02/22/2022  ? Portal vein thrombosis 02/22/2022  ? Clostridium difficile infection 02/21/2022  ? 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 Orthostatic hypotension with weakness and falls  01/29/2022  ? Hyponatremia 01/29/2022  ? Weakness 01/29/2022  ? Portal hypertensive gastropathy ([place]) 01/25/2022  ? Hyperlipidemia 01/21/2022  ? Hypokalemia 01/06/2022  ? Acute blood loss anemia 01/04/2022  ? 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 AKI (acute kidney injury) ([place]) 01/04/2022  ? Elevated LFTs 01/04/2022  ? Gout 01/04/2022  ? Depression 01/04/2022  ? GI bleed 01/04/2022  ? Iron deficiency anemia due to chronic blood loss 01/03/2022  ? Unspecified abnormal finding in specimens from other organs, systems and tissues 11/20/2021  ? Age-related osteoporosis without current pathological fracture 11/01/2021  ? Chronic gouty arthritis 11/01/2021  ? Excessive sleepiness 11/01/2021  ? History of embolic stroke 36/64/4034  ? History of tobacco use 11/01/2021  ? Hypercoagulable state ([place]) 11/01/2021  ? Iron deficiency anemia 11/01/2021  ? Obesity due to excess calories 11/01/2021  ? Personal history of colonic polyps 11/01/2021  ? Polyp of colon 11/01/2021  ? Severe major depression, single episode, without psychotic features ([place]) 11/01/2021  ? Acquired hypothyroidism 02/12/2020  ? Compression fracture of L2 ([place]) 02/01/2020  ? Low back pain 01/18/2020  ? Pain of right hip joint 01/18/2020  ? Paroxysmal atrial fibrillation ([place]) 01/25/2018  ? Cyst of finger 01/23/2018  ? ?PCP:   [name], MD ?Pharmacy:   ?[id number] [address]?Phone: [phone]: [phone]? ?[facility]?[place]?Phone: [phone]: [phone] ? ? ? ? ?Social Determinants of Health (SDOH) Interventions ?  ? ?Readmission Risk Interventions ?   ? View : No data to display.  ?  ?  ?  ? ? ? ?

## 2022-02-22 NOTE — Consult Note (Signed)
?[facility]  ?CONSULT NOTE  ? ? ?Date: 02/22/2022      ?      ?      ?Patient Name:  Diane Pruitt  MRN: [id number]  ?DOB: [birth year]  Age / Sex: 71 y.o., female   ?      ?PCP: [name], MD     ?      ?      ?Service Requesting Consult: TRH     ?      ?      ?Reason for Consult: Acute kidney injury     ?      ? ?History of Present Illness: ?Ms. Diane Pruitt is a 71 y.o.  female with past medical conditions including depression, paroxysmal atrial fibrillation ischemic stroke, alcoholic liver cirrhosis and chronic kidney disease stage IIIb, who was admitted to [facility] on 02/20/2022 for Lower abdominal pain [R10.30] ?Abdominal pain [R10.9] ?Ascites due to alcoholic cirrhosis ([place]) [S17.79] ? ?Patient presents to the emergency department with complaints of abdominal distention and worsening ascites.  Patiently has recently been seen in [facility] emergency department with a paracentesis.  Patient underwent paracentesis at that time with 4 to 5 L of fluid removed. Patient states she has felt generally unwell for months.  She was recently admitted to [facility] for GI bleed.  She reports poor oral intake due to intermittent nausea with vomiting that began 1 to 2 days prior.  Denies shortness of breath or cough.  Denies dizziness, lightheadedness, headache, or fever. ? ?Labs include sodium 133, potassium 5.4, bicarb 21, BUN 44, and creatinine 2.57 with GFR 20.  WBCs 17.9 with hemoglobin 9.4.  Paracentesis completed with 6.6 L of clear yellow fluid removed.  Liver Doppler shows cirrhosis with portal hypertension, partially occluded portal vein thrombus and small to moderate residual intra abdominal ascites. ? ? ?Medications: ?Outpatient medications: ?Medications Prior to Admission  ?Medication Sig Dispense Refill Last Dose  ? alendronate (FOSAMAX) 70 MG tablet Take 70 mg by mouth every Monday.   02/19/2022  ? allopurinol (ZYLOPRIM) 100 MG tablet Take 200 mg by mouth daily.   02/20/2022   ? amiodarone (PACERONE) 200 MG tablet Take 200 mg by mouth in the morning and at bedtime.   3 02/20/2022  ? Cholecalciferol (VITAMIN D) 50 MCG (2000 UT) tablet Take 2,000 Units by mouth daily.   02/20/2022  ? iron polysaccharides (NIFEREX) 150 MG capsule Take 1 capsule (150 mg total) by mouth daily. 30 capsule 1 02/20/2022  ? levothyroxine (SYNTHROID) 200 MCG tablet Take 200 mcg by mouth every morning.   02/20/2022  ? Multiple Vitamin (MULTIVITAMIN) tablet Take 1 tablet by mouth daily.   02/20/2022  ? Multiple Vitamins-Minerals (PRESERVISION AREDS 2 PO) Take 1 capsule by mouth daily. Preservision AREDS-2 250 mg- 90 mg- 40 mg-1 mg   02/20/2022  ? sertraline (ZOLOFT) 50 MG tablet Take 50 mg by mouth daily.   02/19/2022  ? acetaminophen (TYLENOL) 500 MG tablet Take 1,000 mg by mouth every 6 (six) hours as needed for headache or mild pain.   prn  ? midodrine (PROAMATINE) 10 MG tablet Take 1 tablet (10 mg total) by mouth 3 (three) times daily with meals. (Patient not taking: Reported on 02/20/2022)   Not Taking  ? ondansetron (ZOFRAN-ODT) 4 MG disintegrating tablet Take 4 mg by mouth every 4 (four) hours as needed for nausea or vomiting.   prn  ? pantoprazole (PROTONIX) 40 MG tablet Take 1 tablet (40  mg total) by mouth 2 (two) times daily before a meal. 60 tablet 0 prn  ? polyethylene glycol (MIRALAX / GLYCOLAX) 17 g packet Take 17 g by mouth daily as needed for mild constipation. 14 each 0 prn  ? ? ?Current medications: ?Current Facility-Administered Medications  ?Medication Dose Route Frequency Provider Last Rate Last Admin  ? (feeding supplement) PROSource Plus liquid 30 mL  30 mL Oral TID BM Emeterio Reeve, DO   30 mL at 02/22/22 1028  ? 0.9 %  sodium chloride infusion  250 mL Intravenous PRN Agbata, Tochukwu, MD      ? 0.9 %  sodium chloride infusion   Intravenous Continuous Agbata, Tochukwu, MD 40 mL/hr at 02/22/22 0510 Infusion Verify at 02/22/22 0510  ? acetaminophen (TYLENOL) tablet 1,000 mg  1,000 mg Oral Q6H PRN  Agbata, Tochukwu, MD   1,000 mg at 02/22/22 1306  ? albumin human 25 % solution 25 g  25 g Intravenous Q8H Belue, Alver Sorrow, RPH      ? allopurinol (ZYLOPRIM) tablet 100 mg  100 mg Oral Daily Emeterio Reeve, DO   100 mg at 02/22/22 1026  ? amiodarone (PACERONE) tablet 200 mg  200 mg Oral Daily Agbata, Tochukwu, MD   200 mg at 02/22/22 1026  ? cholecalciferol (VITAMIN D3) tablet 2,000 Units  2,000 Units Oral Daily Agbata, Tochukwu, MD   2,000 Units at 02/22/22 1025  ? feeding supplement (BOOST / RESOURCE Laressa Bolinger) liquid 1 Container  1 Container Oral TID BM Emeterio Reeve, DO   1 Container at 02/22/22 1026  ? iron polysaccharides (NIFEREX) capsule 150 mg  150 mg Oral Daily Agbata, Tochukwu, MD   150 mg at 02/22/22 1025  ? levothyroxine (SYNTHROID) tablet 200 mcg  200 mcg Oral q morning Agbata, Tochukwu, MD   200 mcg at 02/22/22 0500  ? midodrine (PROAMATINE) tablet 10 mg  10 mg Oral TID WC Agbata, Tochukwu, MD   10 mg at 02/22/22 1242  ? multivitamin with minerals tablet 1 tablet  1 tablet Oral Daily Emeterio Reeve, DO   1 tablet at 02/22/22 1025  ? ondansetron (ZOFRAN) tablet 4 mg  4 mg Oral Q6H PRN Agbata, Tochukwu, MD      ? Or  ? ondansetron (ZOFRAN) injection 4 mg  4 mg Intravenous Q6H PRN Agbata, Tochukwu, MD      ? pantoprazole (PROTONIX) injection 40 mg  40 mg Intravenous Q24H Agbata, Tochukwu, MD   40 mg at 02/21/22 1730  ? sertraline (ZOLOFT) tablet 50 mg  50 mg Oral Daily Agbata, Tochukwu, MD   50 mg at 02/22/22 1026  ? sodium chloride flush (NS) 0.9 % injection 3 mL  3 mL Intravenous Q12H Agbata, Tochukwu, MD   3 mL at 02/21/22 2132  ? sodium chloride flush (NS) 0.9 % injection 3 mL  3 mL Intravenous PRN Agbata, Tochukwu, MD      ? vancomycin (VANCOCIN) capsule 125 mg  125 mg Oral QID Foust, Katy L, NP   125 mg at 02/22/22 1026  ?  ? ? ?Allergies: ?Allergies  ?Allergen Reactions  ? Colchicine Other (See Comments)  ?  Stomach upset, diarrhea ?Other reaction(s): stomach upset  ?  ? ? ?Past Medical  History: ?Past Medical History:  ?Diagnosis Date  ? Atrial fibrillation ([place])   ? Barrett esophagus   ? Chronic kidney disease (CKD), stage III (moderate) (HCC)   ? Cirrhosis ([place])   ? Dysrhythmia   ? Atrial Fibrillation 2 years ago  ?  Hypertension   ? Ischemic stroke ([place]) 01/25/2018  ? Stroke [facility])   ? ? ? ?Past Surgical History: ?Past Surgical History:  ?Procedure Laterality Date  ? CESAREAN SECTION    ? ESOPHAGOGASTRODUODENOSCOPY (EGD) WITH PROPOFOL N/A 01/05/2022  ? Procedure: ESOPHAGOGASTRODUODENOSCOPY (EGD) WITH PROPOFOL;  Surgeon: Ronnette Juniper, MD;  Location: [facility] ENDOSCOPY;  Service: Gastroenterology;  Laterality: N/A;  ? EYE SURGERY    ? INTRAOCULAR LENS INSERTION Bilateral About 3 years ago  ? IR PARACENTESIS  02/12/2022  ? KYPHOPLASTY N/A 02/18/2020  ? Procedure: KYPHOPLASTY L2;  Surgeon: Melina Schools, MD;  Location: [place];  Service: Orthopedics;  Laterality: N/A;  60 mins  ? TONSILLECTOMY    ? TUBAL LIGATION    ? ? ? ?Family History: ?Family History  ?Problem Relation Age of Onset  ? Hypertension Mother   ? Hyperlipidemia Mother   ? Atrial fibrillation Mother   ? Hyperlipidemia Father   ? Atrial fibrillation Father   ? ? ? ?Social History: ?Social History  ? ?Socioeconomic History  ? Marital status: Divorced  ?  Spouse name: Not on file  ? Number of children: Not on file  ? Years of education: Not on file  ? Highest education level: Not on file  ?Occupational History  ? Not on file  ?Tobacco Use  ? Smoking status: Never  ? Smokeless tobacco: Never  ?Vaping Use  ? Vaping Use: Never used  ?Substance and Sexual Activity  ? Alcohol use: Never  ? Drug use: Never  ? Sexual activity: Not on file  ?Other Topics Concern  ? Not on file  ?Social History Narrative  ? Not on file  ? ?Social Determinants of Health  ? ?Financial Resource Strain: Not on file  ?Food Insecurity: Not on file  ?Transportation Needs: Not on file  ?Physical Activity: Not on file  ?Stress: Not on file  ?Social Connections: Not on file  ?Intimate  Partner Violence: Not on file  ? ? ? ?Review of Systems: ?Review of Systems  ?Constitutional:  Negative for chills, fever and malaise/fatigue.  ?HENT:  Negative for congestion, sore throat and tinnitus

## 2022-02-22 NOTE — Progress Notes (Signed)
? ? ?MEDICINE DAILY PROGRESS NOTE ? ?Patient: Diane Pruitt 71 y.o. female ?MRN: [id number] ? ?Today is hospital day 2 after presenting to ED on 02/20/2022  2:00 PM with abdominal swelling  ? ? ?[place] COURSE: ?02/20/22 to ED w/ abdominal swelling, came to ED from rehab close by, via EMS which was called by the rehab facility. 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 Patient was recently seen at [facility] ER and had paracentesis with drainage of 4 - 5 L of ascitic fluid about a week prior to admission. Patient complains of dyspnea with exertion which is new and nausea with coffee-ground emesis 1 day prior to her admission. Chart review shows that she was admitted at [facility] from 04/03 through 04/06 for upper GI bleed and had an upper endoscopy which showed normal esophagus and portal hypertensive gastropathy. She denies having any abdominal pain, no fever, no chills, no cough, no headache, no dizziness, no lightheadedness, no changes in her bowel habits, no focal deficit or blurred vision. Found to be AKI w/ Possible hepatorenal syndrome, CDiff (+). Pt underwent paracentesis 02/21/22. 02/22/22 no substantial improvement in AKI so nephrology was consulted. IR also noted portal vein thrombosis, GI requested consult w/ hematology to anticoagulate, will plan for EGD to r/o varices.  ? ?Procedures and Significant Results:  ?Recent paracentesis 02/12/22 yielding 6.3 liters, also had paracentesis 01/04/22 ?Recent EGD 01/05/22 showed normal esophagus and portal hypertensive gastropathy ?Labs on admission 02/20/22:  ?AKI w/ Cr 2.66 --> 2.57 02/22/22  ?Hyponatremia w/ Sodium 129 --> 133 02/22/22  ?Hyperkalemia w/ K 5.2 --> 5.4 02/22/22  ?Leukocytosis w/ WBC 18.1 --> 17.9 02/22/22  ?Anemia w/ Hgb 10.6 --> 9.4  02/22/22  ?Recent TSH 02/12/22 was 13.1 ?Elevated liver enz c/w her cirrhosis  ? ? ?Consultants:  ?Gastroenterology 02/20/22: initial consult notes MELD score 25, concern for possible hepatorenal syndrome, rehydration to treat AKI will likely worsen ascites, recommended albumin w/ paracentesis to avoid worsening renal damage, eval for SBP, stool panel ordered ?IR for paracentesis 02/21/22: Successful ultrasound guided paracentesis from the RUQ.  Yielded 6.6 L of clear yellow fluid. No immediate complications. The patient has required >/=2 paracenteses in a 30 day period and a screening evaluation by the [facility] has been arranged. ?Nephrology 02/22/22 AKI not improving, concern for hepatorenal syndrome  ?Hematology 02/22/22 to assist w/ anticoagulation ? ? ?SUBJECTIVE:  ?Pt seen and examined in bed this MA, reports swelling in abdomen better post paracentesis, no fever or chills.  ? ? ? ? ?ASSESSMENT & PLAN ? ? ?Paroxysmal atrial fibrillation ([place]) ?Not on anticoagulation due to recent bleeding ?Continue Amiodarone for rate control ? ?Hyponatremia ?Most likely related to volume overload ?Improved to Na 134 today 02/22/22  ?Will monitor BMP ? ? ?Depression ?Stable ?Continue sertraline ? 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 ?She presented 02/20/22 for evaluation of worsening ascites without abdominal pain. ?Emesis w/ bilious material, coffee grounds material ?IV Protonix for GI prophylaxis ?Rocephin was d/c today 02/22/22  ?Hemoglobin stable at admission - held off on octreotide for now ?IR for paracentesis 02/21/22: Successful ultrasound guided paracentesis from the RUQ.  Yielded 6.6 L of clear yellow fluid. No immediate complications. The patient has required >/=2 paracenteses in  a 30 day period and a screening evaluation by the [facility] has been arranged. ?GI following, planning for repeat EGD  ? ?AKI (acute kidney injury) ([place]) ?Patient has a baseline serum creatinine of 1.3 and 02/20/22 at admission it was 2.66, yesterday 02/21/22 minimal improvement to 2.57 ?She had massive ascites but also intracellular fluid depletion ?Chart review shows she was hypotensive after her last paracentesis at [facility]. Hypotensive as well after yesterday's paracentesis but not severely  ?AKI may be secondary to GI losses from diarrhea versus hepatorenal syndrome but not responding to IV fluid hydration, also receiving albumin  ?Repeat renal parameters in a.m. today 02/22/22 not significantly improving - consulted nephrology ? ?Acquired hypothyroidism ?Stable ?Continue Synthroid ? ?Clostridium difficile infection ?Started on po vancomycin 02/20/22 ?C Diff likely contributing to leukocytosis, which is minimally trending down, monitor CBC ?Also likely contributing to volume depletion and subsequent AKI  ? ?Hypercoagulable state ([place]) ?No Rx VTE ppx  ? ?Chronic gouty arthritis ?Reduced dose of allopurinol from 200 to 100 mg daily given renal fxn  ? ?History of embolic stroke ?Unable to be on ASA/Statin d/t bleed risk and liver risk respectively ? ?Portal hypertensive gastropathy (HCC) ?Recent EGD no bleeding varices, GI following  ? ?Iron deficiency anemia ?Continue po iron and monitor CBC ?Hgb drop slightly today possible dilution effect from rehydration to treat AKI, continue to monitor  ? ?Hyperkalemia ?Resolved based on BMP today 02/22/22  ? ?Abnormal urinalysis ?Possible contaminant (epithelial cells present) but either way added on UCx to previously collected urine, she is on ceftriaxone for SBP Ppx which would hopefully treat most UTI ? 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 ? ?VTE Ppx: not on  Rx d/t bleed risk and recent GI bleed ?CODE STATUS: FULL ?Admitted from: rehab facility to treat alcohol dependence  ?Expected Dispo: back to rehab facility  ?Barriers to discharge: needs paracentesis and w/ hx hypotension after these procedures will need close monitoring, will also need to improve/resolve AKI vs determine if hepatorenal syndrome is a factor  ?Family communication: will reach out to family later today once paracentesis completed  ? ? ? ? ? ? ? ? ? ? ? ? ? ?Objective: ?Vital signs in last 24 hours: ?Temp:  [97.5 ?F (36.4 ?C)-98.4 ?F (36.9 ?C)] 98.1 ?F (36.7 ?C) (04/27 0456) ?Pulse Rate:  [61-68] 67 (04/27 0456) ?Resp:  [16-18] 18 (04/27 0456) ?BP: (93-113)/(40-72) 100/46 (04/27 0509) ?SpO2:  [89 %-95 %] 92 % (04/27 0456) ?Weight change:  ?Last BM Date : 02/21/22 ? ?Intake/Output from previous day: ?04/26 0701 - 04/27 0700 ?In: 1104.3 [P.O.:357; I.V.:547.3; IV Piggyback:200] ?Out: -  ?Intake/Output this shift: ?No intake/output data recorded. ? ?Constitutional:  ?General Appearance: alert, well-developed, well-nourished, NAD ?Eyes: ?Normal lids and conjunctive ?Neck: ?No masses, trachea midline ?Respiratory: ?Normal respiratory effort ?Breath sounds normal, no wheeze/rhonchi/rales ?Cardiovascular: ?S1/S2 normal, no murmur/rub/gallop auscultated ?No lower extremity edema ?Gastrointestinal: ?Nontender ?Distension improved/resolved frmo yesterday  ?No hernia appreciated ?Musculoskeletal:  ?No clubbing/cyanosis of digits ?Neurological: ?No cranial nerve deficit on limited exam ?Motor intact and symmetric ?Psychiatric: ?Normal judgment/insight ?Normal mood and affect ? ? ?Lab Results: ?Recent Labs  ?  02/21/22 ?0342 02/22/22 ?0336  ?WBC 17.9* 17.3*  ?HGB 9.4* 9.1*  ?HCT 29.1* 28.5*  ?PLT 241 211  ? ? ?BMET ?Recent Labs  ?  02/21/22 ?0342 02/22/22 ?0336  ?NA 133* 134*  ?  K 5.4* 4.3  ?CL 103 108  ?CO2 21* 19*  ?GLUCOSE 80 76  ?BUN 44* 41*  ?CREATININE 2.57* 2.35*  ?CALCIUM 8.3* 8.0*   ? ? ? ?Studies/Results: ?US Paracentesis ? ?Result Date: 02/21/2022 ?INDICATION: Patient with cirrhosis and recurrent ascites, request received for diagnostic and therapeutic paracentesis. EXAM: ULTRASOUND GUIDED PARACENTESIS MEDICATI

## 2022-02-23 ENCOUNTER — Encounter (HOSPITAL_COMMUNITY): Payer: Self-pay | Admitting: Osteopathic Medicine

## 2022-02-23 ENCOUNTER — Inpatient Hospital Stay: Admit: 2022-02-23 | Payer: Medicare HMO

## 2022-02-23 ENCOUNTER — Inpatient Hospital Stay (HOSPITAL_COMMUNITY)
Admit: 2022-02-23 | Discharge: 2022-02-23 | Disposition: A | Discharge status: Home / Self Care | Payer: Medicare HMO | Attending: Interventional Radiology | Admitting: Interventional Radiology

## 2022-02-23 ENCOUNTER — Other Ambulatory Visit: Payer: Self-pay

## 2022-02-23 ENCOUNTER — Inpatient Hospital Stay (HOSPITAL_COMMUNITY)
Admission: AD | Admit: 2022-02-23 | Discharge: 2022-03-07 | DRG: 432 | Disposition: A | Discharge status: Skilled Nursing Facility | Payer: Medicare HMO | Source: Other Acute Inpatient Hospital | Attending: Internal Medicine | Admitting: Internal Medicine

## 2022-02-23 DIAGNOSIS — A0472 Enterocolitis due to Clostridium difficile, not specified as recurrent: Secondary | ICD-10-CM | POA: Diagnosis not present

## 2022-02-23 DIAGNOSIS — E872 Acidosis, unspecified: Secondary | ICD-10-CM | POA: Diagnosis not present

## 2022-02-23 DIAGNOSIS — K767 Hepatorenal syndrome: Secondary | ICD-10-CM | POA: Diagnosis present

## 2022-02-23 DIAGNOSIS — I959 Hypotension, unspecified: Secondary | ICD-10-CM | POA: Diagnosis not present

## 2022-02-23 DIAGNOSIS — I480 Paroxysmal atrial fibrillation: Secondary | ICD-10-CM | POA: Diagnosis present

## 2022-02-23 DIAGNOSIS — N179 Acute kidney failure, unspecified: Secondary | ICD-10-CM | POA: Diagnosis present

## 2022-02-23 DIAGNOSIS — Z8673 Personal history of transient ischemic attack (TIA), and cerebral infarction without residual deficits: Secondary | ICD-10-CM

## 2022-02-23 DIAGNOSIS — K2270 Barrett's esophagus without dysplasia: Secondary | ICD-10-CM | POA: Diagnosis present

## 2022-02-23 DIAGNOSIS — K7040 Alcoholic hepatic failure without coma: Secondary | ICD-10-CM | POA: Diagnosis present

## 2022-02-23 DIAGNOSIS — E876 Hypokalemia: Secondary | ICD-10-CM | POA: Diagnosis present

## 2022-02-23 DIAGNOSIS — E669 Obesity, unspecified: Secondary | ICD-10-CM | POA: Diagnosis not present

## 2022-02-23 DIAGNOSIS — R531 Weakness: Secondary | ICD-10-CM | POA: Diagnosis not present

## 2022-02-23 DIAGNOSIS — D649 Anemia, unspecified: Secondary | ICD-10-CM | POA: Diagnosis not present

## 2022-02-23 DIAGNOSIS — D62 Acute posthemorrhagic anemia: Secondary | ICD-10-CM | POA: Diagnosis present

## 2022-02-23 DIAGNOSIS — R188 Other ascites: Secondary | ICD-10-CM | POA: Diagnosis not present

## 2022-02-23 DIAGNOSIS — Z888 Allergy status to other drugs, medicaments and biological substances status: Secondary | ICD-10-CM

## 2022-02-23 DIAGNOSIS — N1832 Chronic kidney disease, stage 3b: Secondary | ICD-10-CM | POA: Diagnosis not present

## 2022-02-23 DIAGNOSIS — I361 Nonrheumatic tricuspid (valve) insufficiency: Secondary | ICD-10-CM | POA: Diagnosis not present

## 2022-02-23 DIAGNOSIS — Z79890 Hormone replacement therapy (postmenopausal): Secondary | ICD-10-CM

## 2022-02-23 DIAGNOSIS — Z7401 Bed confinement status: Secondary | ICD-10-CM | POA: Diagnosis not present

## 2022-02-23 DIAGNOSIS — I129 Hypertensive chronic kidney disease with stage 1 through stage 4 chronic kidney disease, or unspecified chronic kidney disease: Secondary | ICD-10-CM | POA: Diagnosis present

## 2022-02-23 DIAGNOSIS — K319 Disease of stomach and duodenum, unspecified: Secondary | ICD-10-CM | POA: Diagnosis present

## 2022-02-23 DIAGNOSIS — D696 Thrombocytopenia, unspecified: Secondary | ICD-10-CM | POA: Diagnosis present

## 2022-02-23 DIAGNOSIS — D509 Iron deficiency anemia, unspecified: Secondary | ICD-10-CM | POA: Diagnosis not present

## 2022-02-23 DIAGNOSIS — E875 Hyperkalemia: Secondary | ICD-10-CM | POA: Diagnosis not present

## 2022-02-23 DIAGNOSIS — K766 Portal hypertension: Secondary | ICD-10-CM | POA: Diagnosis present

## 2022-02-23 DIAGNOSIS — E440 Moderate protein-calorie malnutrition: Secondary | ICD-10-CM | POA: Diagnosis present

## 2022-02-23 DIAGNOSIS — R0602 Shortness of breath: Secondary | ICD-10-CM | POA: Diagnosis not present

## 2022-02-23 DIAGNOSIS — I340 Nonrheumatic mitral (valve) insufficiency: Secondary | ICD-10-CM

## 2022-02-23 DIAGNOSIS — I951 Orthostatic hypotension: Secondary | ICD-10-CM | POA: Diagnosis not present

## 2022-02-23 DIAGNOSIS — E871 Hypo-osmolality and hyponatremia: Secondary | ICD-10-CM | POA: Diagnosis not present

## 2022-02-23 DIAGNOSIS — I272 Pulmonary hypertension, unspecified: Secondary | ICD-10-CM | POA: Diagnosis not present

## 2022-02-23 DIAGNOSIS — K7460 Unspecified cirrhosis of liver: Secondary | ICD-10-CM | POA: Diagnosis not present

## 2022-02-23 DIAGNOSIS — Z538 Procedure and treatment not carried out for other reasons: Secondary | ICD-10-CM | POA: Diagnosis not present

## 2022-02-23 DIAGNOSIS — K3189 Other diseases of stomach and duodenum: Secondary | ICD-10-CM | POA: Diagnosis not present

## 2022-02-23 DIAGNOSIS — Z515 Encounter for palliative care: Secondary | ICD-10-CM | POA: Diagnosis not present

## 2022-02-23 DIAGNOSIS — R2689 Other abnormalities of gait and mobility: Secondary | ICD-10-CM | POA: Diagnosis present

## 2022-02-23 DIAGNOSIS — F32A Depression, unspecified: Secondary | ICD-10-CM | POA: Diagnosis not present

## 2022-02-23 DIAGNOSIS — Z66 Do not resuscitate: Secondary | ICD-10-CM | POA: Diagnosis present

## 2022-02-23 DIAGNOSIS — R69 Illness, unspecified: Secondary | ICD-10-CM | POA: Diagnosis not present

## 2022-02-23 DIAGNOSIS — Z79899 Other long term (current) drug therapy: Secondary | ICD-10-CM

## 2022-02-23 DIAGNOSIS — K7031 Alcoholic cirrhosis of liver with ascites: Principal | ICD-10-CM | POA: Diagnosis present

## 2022-02-23 DIAGNOSIS — I10 Essential (primary) hypertension: Secondary | ICD-10-CM | POA: Diagnosis not present

## 2022-02-23 DIAGNOSIS — G9341 Metabolic encephalopathy: Secondary | ICD-10-CM | POA: Diagnosis not present

## 2022-02-23 DIAGNOSIS — N183 Chronic kidney disease, stage 3 unspecified: Secondary | ICD-10-CM | POA: Diagnosis not present

## 2022-02-23 DIAGNOSIS — J9811 Atelectasis: Secondary | ICD-10-CM | POA: Diagnosis not present

## 2022-02-23 DIAGNOSIS — E8770 Fluid overload, unspecified: Secondary | ICD-10-CM | POA: Diagnosis present

## 2022-02-23 DIAGNOSIS — I4891 Unspecified atrial fibrillation: Secondary | ICD-10-CM | POA: Diagnosis not present

## 2022-02-23 DIAGNOSIS — D631 Anemia in chronic kidney disease: Secondary | ICD-10-CM | POA: Diagnosis present

## 2022-02-23 DIAGNOSIS — N1831 Chronic kidney disease, stage 3a: Secondary | ICD-10-CM | POA: Diagnosis not present

## 2022-02-23 DIAGNOSIS — I81 Portal vein thrombosis: Secondary | ICD-10-CM | POA: Diagnosis not present

## 2022-02-23 DIAGNOSIS — N289 Disorder of kidney and ureter, unspecified: Secondary | ICD-10-CM | POA: Diagnosis not present

## 2022-02-23 DIAGNOSIS — M1A00X0 Idiopathic chronic gout, unspecified site, without tophus (tophi): Secondary | ICD-10-CM | POA: Diagnosis not present

## 2022-02-23 DIAGNOSIS — E785 Hyperlipidemia, unspecified: Secondary | ICD-10-CM | POA: Diagnosis not present

## 2022-02-23 DIAGNOSIS — K7682 Hepatic encephalopathy: Secondary | ICD-10-CM | POA: Diagnosis present

## 2022-02-23 DIAGNOSIS — E039 Hypothyroidism, unspecified: Secondary | ICD-10-CM | POA: Diagnosis not present

## 2022-02-23 DIAGNOSIS — R918 Other nonspecific abnormal finding of lung field: Secondary | ICD-10-CM | POA: Diagnosis not present

## 2022-02-23 DIAGNOSIS — D6859 Other primary thrombophilia: Secondary | ICD-10-CM | POA: Diagnosis not present

## 2022-02-23 DIAGNOSIS — R54 Age-related physical debility: Secondary | ICD-10-CM | POA: Diagnosis present

## 2022-02-23 DIAGNOSIS — A498 Other bacterial infections of unspecified site: Secondary | ICD-10-CM | POA: Diagnosis present

## 2022-02-23 DIAGNOSIS — Z7189 Other specified counseling: Secondary | ICD-10-CM | POA: Diagnosis not present

## 2022-02-23 DIAGNOSIS — Z6832 Body mass index (BMI) 32.0-32.9, adult: Secondary | ICD-10-CM

## 2022-02-23 DIAGNOSIS — Z8249 Family history of ischemic heart disease and other diseases of the circulatory system: Secondary | ICD-10-CM

## 2022-02-23 DIAGNOSIS — R8290 Unspecified abnormal findings in urine: Secondary | ICD-10-CM | POA: Diagnosis not present

## 2022-02-23 LAB — CBC
HCT: 28.7 % — ABNORMAL LOW (ref 36.0–46.0)
Hemoglobin: 9.1 g/dL — ABNORMAL LOW (ref 12.0–15.0)
MCH: 26 pg (ref 26.0–34.0)
MCHC: 31.7 g/dL (ref 30.0–36.0)
MCV: 82 fL (ref 80.0–100.0)
Platelets: 191 10*3/uL (ref 150–400)
RBC: 3.5 MIL/uL — ABNORMAL LOW (ref 3.87–5.11)
RDW: 20.6 % — ABNORMAL HIGH (ref 11.5–15.5)
WBC: 17.9 10*3/uL — ABNORMAL HIGH (ref 4.0–10.5)
nRBC: 0 % (ref 0.0–0.2)

## 2022-02-23 LAB — COMPREHENSIVE METABOLIC PANEL
ALT: 66 U/L — ABNORMAL HIGH (ref 0–44)
AST: 157 U/L — ABNORMAL HIGH (ref 15–41)
Albumin: 2.9 g/dL — ABNORMAL LOW (ref 3.5–5.0)
Alkaline Phosphatase: 100 U/L (ref 38–126)
Anion gap: 9 (ref 5–15)
BUN: 38 mg/dL — ABNORMAL HIGH (ref 8–23)
CO2: 17 mmol/L — ABNORMAL LOW (ref 22–32)
Calcium: 7.8 mg/dL — ABNORMAL LOW (ref 8.9–10.3)
Chloride: 107 mmol/L (ref 98–111)
Creatinine, Ser: 2.25 mg/dL — ABNORMAL HIGH (ref 0.44–1.00)
GFR, Estimated: 23 mL/min — ABNORMAL LOW (ref >60)
Glucose, Bld: 93 mg/dL (ref 70–99)
Potassium: 3.5 mmol/L (ref 3.5–5.1)
Sodium: 133 mmol/L — ABNORMAL LOW (ref 135–145)
Total Bilirubin: 1 mg/dL (ref 0.3–1.2)
Total Protein: 5.9 g/dL — ABNORMAL LOW (ref 6.5–8.1)

## 2022-02-23 LAB — ECHOCARDIOGRAM COMPLETE BUBBLE STUDY
AR max vel: 2.51 cm2
AV Area VTI: 3.12 cm2
AV Area mean vel: 2.63 cm2
AV Mean grad: 5 mmHg
AV Peak grad: 8.7 mmHg
Ao pk vel: 1.48 m/s
Area-P 1/2: 3.87 cm2
MV VTI: 2.03 cm2
S' Lateral: 2.8 cm

## 2022-02-23 LAB — PROTIME-INR
INR: 1.5 — ABNORMAL HIGH (ref 0.8–1.2)
Prothrombin Time: 18.3 seconds — ABNORMAL HIGH (ref 11.4–15.2)

## 2022-02-23 LAB — SODIUM, URINE, RANDOM: Sodium, Ur: <10 mmol/L

## 2022-02-23 LAB — CYTOLOGY - NON PAP

## 2022-02-23 MED ORDER — ACETAMINOPHEN 325 MG PO TABS
650.0000 mg | ORAL_TABLET | Freq: Four times a day (QID) | ORAL | Status: DC | PRN
  Administered 2022-03-04 – 2022-03-06 (×5): 650 mg via ORAL
  Filled 2022-02-23 (×5): qty 2

## 2022-02-23 MED ORDER — ADULT MULTIVITAMIN W/MINERALS CH
1.0000 | ORAL_TABLET | Freq: Every day | ORAL | Status: DC
  Administered 2022-02-25 – 2022-03-07 (×11): 1 via ORAL
  Filled 2022-02-23 (×11): qty 1

## 2022-02-23 MED ORDER — PANTOPRAZOLE SODIUM 40 MG PO TBEC
40.0000 mg | DELAYED_RELEASE_TABLET | Freq: Two times a day (BID) | ORAL | Status: DC
  Administered 2022-02-23 – 2022-03-07 (×23): 40 mg via ORAL
  Filled 2022-02-23 (×23): qty 1

## 2022-02-23 MED ORDER — ALLOPURINOL 100 MG PO TABS
200.0000 mg | ORAL_TABLET | Freq: Every day | ORAL | Status: DC
  Administered 2022-02-25 – 2022-03-07 (×11): 200 mg via ORAL
  Filled 2022-02-23 (×11): qty 2

## 2022-02-23 MED ORDER — ONDANSETRON HCL 4 MG/2ML IJ SOLN
4.0000 mg | Freq: Four times a day (QID) | INTRAMUSCULAR | Status: DC | PRN
  Administered 2022-03-05: 4 mg via INTRAVENOUS
  Filled 2022-02-23: qty 2

## 2022-02-23 MED ORDER — AMIODARONE HCL 200 MG PO TABS
200.0000 mg | ORAL_TABLET | Freq: Every day | ORAL | Status: DC
  Administered 2022-02-25 – 2022-03-07 (×11): 200 mg via ORAL
  Filled 2022-02-23 (×11): qty 1

## 2022-02-23 MED ORDER — VANCOMYCIN HCL 125 MG PO CAPS
125.0000 mg | ORAL_CAPSULE | Freq: Four times a day (QID) | ORAL | Status: AC
  Administered 2022-02-23 – 2022-03-05 (×37): 125 mg via ORAL
  Filled 2022-02-23 (×43): qty 1

## 2022-02-23 MED ORDER — AMIODARONE HCL 200 MG PO TABS: 200.0000 mg | ORAL_TABLET | Freq: Two times a day (BID) | ORAL | Status: DC

## 2022-02-23 MED ORDER — SODIUM BICARBONATE 8.4 % IV SOLN
INTRAVENOUS | Status: DC
  Filled 2022-02-23: qty 1000
  Filled 2022-02-23: qty 150

## 2022-02-23 MED ORDER — LEVOTHYROXINE SODIUM 100 MCG PO TABS
200.0000 ug | ORAL_TABLET | Freq: Every morning | ORAL | Status: DC
  Administered 2022-02-24 – 2022-03-07 (×12): 200 ug via ORAL
  Filled 2022-02-23 (×12): qty 2

## 2022-02-23 MED ORDER — SODIUM BICARBONATE 8.4 % IV SOLN
INTRAVENOUS | Status: DC
  Filled 2022-02-23: qty 150

## 2022-02-23 MED ORDER — MIDODRINE HCL 5 MG PO TABS
10.0000 mg | ORAL_TABLET | Freq: Three times a day (TID) | ORAL | Status: DC
  Administered 2022-02-24: 10 mg via ORAL
  Filled 2022-02-23: qty 2

## 2022-02-23 MED ORDER — SERTRALINE HCL 50 MG PO TABS
50.0000 mg | ORAL_TABLET | Freq: Every day | ORAL | Status: DC
  Administered 2022-02-25 – 2022-03-07 (×11): 50 mg via ORAL
  Filled 2022-02-23 (×11): qty 1

## 2022-02-23 MED ORDER — ACETAMINOPHEN 650 MG RE SUPP: 650.0000 mg | Freq: Four times a day (QID) | RECTAL | Status: DC | PRN

## 2022-02-23 MED ORDER — ONDANSETRON HCL 4 MG PO TABS: 4.0000 mg | ORAL_TABLET | Freq: Four times a day (QID) | ORAL | Status: DC | PRN

## 2022-02-23 NOTE — Progress Notes (Signed)
?Diane Pruitt  ?ROUNDING NOTE  ? ?Subjective:  ? ?Patient resting comfortably ?No family at bedside ?Alert and oriented ?Tolerating small meals ? ?Creatinine 2.25 ? ? ?Objective:  ?Vital signs in last 24 hours:  ?Temp:  [97.8 ?F (36.6 ?C)-98.7 ?F (37.1 ?C)] 97.8 ?F (36.6 ?C) (04/28 9563) ?Pulse Rate:  [61-68] 67 (04/28 0836) ?Resp:  [15-20] 17 (04/28 0836) ?BP: (92-105)/(46-52) 95/49 (04/28 0836) ?SpO2:  [91 %-94 %] 94 % (04/28 0836) ? ?Weight change:  ?Filed Weights  ? 02/20/22 1404  ?Weight: 77.1 kg  ? ? ?Intake/Output: ?I/O last 3 completed shifts: ?In: 2669 [P.O.:1464; I.V.:1126.9; IV Piggyback:78.1] ?Out: -  ?  ?Intake/Output this shift: ? No intake/output data recorded. ? ?Physical Exam: ?General: NAD  ?Head: Normocephalic, atraumatic. Moist oral mucosal membranes  ?Eyes: Anicteric  ?Lungs:  Clear to auscultation, normal effort  ?Heart: Regular rate and rhythm  ?Abdomen:  Soft, nontender  ?Extremities:  No peripheral edema.  ?Neurologic: Nonfocal, moving all four extremities  ?Skin: No lesions  ?Access: None  ? ? ?Basic Metabolic Panel: ?Recent Labs  ?Lab 02/20/22 ?1408 02/21/22 ?0342 02/22/22 ?8756 02/23/22 ?0446  ?NA 129* 133* 134* 133*  ?K 5.2* 5.4* 4.3 3.5  ?CL 102 103 108 107  ?CO2 19* 21* 19* 17*  ?GLUCOSE 124* 80 76 93  ?BUN 45* 44* 41* 38*  ?CREATININE 2.66* 2.57* 2.35* 2.25*  ?CALCIUM 8.5* 8.3* 8.0* 7.8*  ? ? ?Liver Function Tests: ?Recent Labs  ?Lab 02/20/22 ?1408 02/22/22 ?0336 02/23/22 ?0446  ?AST 238* 172* 157*  ?ALT 94* 71* 66*  ?ALKPHOS 133* 106 100  ?BILITOT 1.5* 0.9 1.0  ?PROT 6.8 5.8* 5.9*  ?ALBUMIN 2.5* 2.5* 2.9*  ? ?Recent Labs  ?Lab 02/20/22 ?1408  ?LIPASE 43  ? ?No results for input(s): AMMONIA in the last 168 hours. ? ?CBC: ?Recent Labs  ?Lab 02/20/22 ?1408 02/21/22 ?0342 02/22/22 ?4332 02/23/22 ?0446  ?WBC 18.1* 17.9* 17.3* 17.9*  ?NEUTROABS 15.6*  --   --   --   ?HGB 10.6* 9.4* 9.1* 9.1*  ?HCT 33.8* 29.1* 28.5* 28.7*  ?MCV 81.4 80.6 80.7 82.0  ?PLT 305 241 211 191   ? ? ?Cardiac Enzymes: ?No results for input(s): CKTOTAL, CKMB, CKMBINDEX, TROPONINI in the last 168 hours. ? ?BNP: ?Invalid input(s): POCBNP ? ?CBG: ?No results for input(s): GLUCAP in the last 168 hours. ? ?Microbiology: ?Results for orders placed or performed during the hospital encounter of 02/20/22  ?Resp Panel by RT-PCR (Flu A&B, Covid) Nasopharyngeal Swab     Status: None  ? Collection Time: 02/20/22  3:03 PM  ? Specimen: Nasopharyngeal Swab; Nasopharyngeal(NP) swabs in vial transport medium  ?Result Value Ref Range Status  ? SARS Coronavirus 2 by RT PCR NEGATIVE NEGATIVE Final  ?  Comment: (NOTE) ?SARS-CoV-2 target nucleic acids are NOT DETECTED. ? ?The SARS-CoV-2 RNA is generally detectable in upper respiratory ?specimens during the acute phase of infection. The lowest ?concentration of SARS-CoV-2 viral copies this assay can detect is ?138 copies/mL. A negative result does not preclude SARS-Cov-2 ?infection and should not be used as the sole basis for treatment or ?other patient management decisions. A negative result may occur with  ?improper specimen collection/handling, submission of specimen other ?than nasopharyngeal swab, presence of viral mutation(s) within the ?areas targeted by this assay, and inadequate number of viral ?copies(<138 copies/mL). A negative result must be combined with ?clinical observations, patient history, and epidemiological ?information. The expected result is Negative. ? ?Fact Sheet for Patients:  ?EntrepreneurPulse.com.au ? ?Fact  Sheet for Healthcare Providers:  ?IncredibleEmployment.be ? ?This test is no t yet approved or cleared by the [place] FDA and  ?has been authorized for detection and/or diagnosis of SARS-CoV-2 by ?FDA under an Emergency Use Authorization (EUA). This EUA will remain  ?in effect (meaning this test can be used) for the duration of the ?COVID-19 declaration under Section 564(b)(1) of the Act, 21 ?U.S.C.section  360bbb-3(b)(1), unless the authorization is terminated  ?or revoked sooner.  ? ? ?  ? Influenza A by PCR NEGATIVE NEGATIVE Final  ? Influenza B by PCR NEGATIVE NEGATIVE Final  ?  Comment: (NOTE) ?The Xpert Xpress SARS-CoV-2/FLU/RSV plus assay is intended as an aid ?in the diagnosis of influenza from Nasopharyngeal swab specimens and ?should not be used as a sole basis for treatment. Nasal washings and ?aspirates are unacceptable for Xpert Xpress SARS-CoV-2/FLU/RSV ?testing. ? ?Fact Sheet for Patients: ?EntrepreneurPulse.com.au ? ?Fact Sheet for Healthcare Providers: ?IncredibleEmployment.be ? ?This test is not yet approved or cleared by the [place] FDA and ?has been authorized for detection and/or diagnosis of SARS-CoV-2 by ?FDA under an Emergency Use Authorization (EUA). This EUA will remain ?in effect (meaning this test can be used) for the duration of the ?COVID-19 declaration under Section 564(b)(1) of the Act, 21 U.S.C. ?section 360bbb-3(b)(1), unless the authorization is terminated or ?revoked. ? ?Performed at [facility], [place], ?[place] [address] ?  ?MRSA Next Gen by PCR, Nasal     Status: None  ? Collection Time: 02/21/22 12:44 AM  ? Specimen: Nasal Mucosa; Nasal Swab  ?Result Value Ref Range Status  ? MRSA by PCR Next Gen NOT DETECTED NOT DETECTED Final  ?  Comment: (NOTE) ?The GeneXpert MRSA Assay (FDA approved for NASAL specimens only), ?is one component of a comprehensive MRSA colonization surveillance ?program. It is not intended to diagnose MRSA infection nor to guide ?or monitor treatment for MRSA infections. ?Test performance is not FDA approved in patients less than 2 years ?old. ?Performed at [facility], [place], ?[place] [address] ?  ?Gastrointestinal Panel by PCR , Stool     Status: Abnormal  ? Collection Time: 02/21/22 12:44 AM  ? Specimen: Stool  ?Result Value Ref Range Status  ? Campylobacter  species NOT DETECTED NOT DETECTED Final  ? Plesimonas shigelloides NOT DETECTED NOT DETECTED Final  ? Salmonella species NOT DETECTED NOT DETECTED Final  ? Yersinia enterocolitica NOT DETECTED NOT DETECTED Final  ? Vibrio species NOT DETECTED NOT DETECTED Final  ? Vibrio cholerae NOT DETECTED NOT DETECTED Final  ? Enteroaggregative E coli (EAEC) NOT DETECTED NOT DETECTED Final  ? Enteropathogenic E coli (EPEC) NOT DETECTED NOT DETECTED Final  ? Enterotoxigenic E coli (ETEC) NOT DETECTED NOT DETECTED Final  ? Shiga like toxin producing E coli (STEC) NOT DETECTED NOT DETECTED Final  ? Shigella/Enteroinvasive E coli (EIEC) NOT DETECTED NOT DETECTED Final  ? Cryptosporidium NOT DETECTED NOT DETECTED Final  ? Cyclospora cayetanensis NOT DETECTED NOT DETECTED Final  ? Entamoeba histolytica NOT DETECTED NOT DETECTED Final  ? Giardia lamblia NOT DETECTED NOT DETECTED Final  ? Adenovirus F40/41 NOT DETECTED NOT DETECTED Final  ? Astrovirus NOT DETECTED NOT DETECTED Final  ? Norovirus GI/GII NOT DETECTED NOT DETECTED Final  ? Rotavirus A NOT DETECTED NOT DETECTED Final  ? Sapovirus (I, II, IV, and V) DETECTED (A) NOT DETECTED Final  ?  Comment: Performed at [facility], [address] [place], [place] [address]  ?C  Difficile Quick Screen w PCR reflex     Status: Abnormal  ? Collection Time: 02/21/22 12:44 AM  ? Specimen: STOOL  ?Result Value Ref Range Status  ? C Diff antigen POSITIVE (A) NEGATIVE Final  ? C Diff toxin POSITIVE (A) NEGATIVE Final  ? C Diff interpretation Toxin producing C. difficile detected.  Final  ?  Comment: CRITICAL RESULT CALLED TO, READ BACK BY AND VERIFIED WITH: ?MARIA BROWN @ 0154 ON 02/21/2022.Marland KitchenMarland KitchenTKR ?Performed at [facility], [address], [place], [place] [address] ?  ?Body fluid culture w Gram Stain     Status: None (Preliminary result)  ? Collection Time: 02/21/22  3:49 PM  ? Specimen: PATH Cytology Peritoneal fluid  ?Result Value Ref Range Status  ? Specimen Description    Final  ?  PERITONEAL ?Performed at [facility], [address], [place], [place] [address] ?  ? Special Requests   Final  ?  NONE ?Performed at [facility], [place], [place]

## 2022-02-23 NOTE — Progress Notes (Signed)
*  PRELIMINARY RESULTS* ?Echocardiogram ?2D Echocardiogram has been performed. ? ?Miche Loughridge, Sonia Side ?02/23/2022, 12:22 PM ?

## 2022-02-23 NOTE — H&P (Signed)
?History and Physical  ? ? ?Diane Pruitt GNF:[id number] DOB: April 07, 1951 DOA: 02/23/2022 ? ?PCP: [name], MD  ?Patient coming from: [facility] ? ?I have personally briefly reviewed patient's old medical records available.  ? ?Chief Complaint: Weakness, recurrent ascites and transferred for procedure. ? ?HPI:  Diane Pruitt is a 71 y.o. 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   ?Patient was recently admitted to [facility] 4/3 -4/6 for upper GI bleeding, portal hypertensive gastropathy, ascites and was discharged to a skilled nursing facility near [place].  Patient tells me she felt well for a few days, however since then she felt poorly, poor appetite, weakness, dizziness and abdominal distention and diarrhea.   ?On admission to [facility] she was found with AKI consistent with hepatorenal syndrome, C. difficile positive diarrhea. ?Paracentesis 4/26 ?IR consulted, noted portal vein thrombosis.  After careful discussion, decided to undergo TIPS procedure hence transferred to [facility]. ?I examined patient after she arrived to the medical floor.  Currently denies any complaints.  She tells me that she is overall weak.  She tells me she has not walked after hospitalization. ? ?Review of Systems: all systems are reviewed and pertinent positive as per HPI otherwise rest are negative.  ? ? ?Past Medical History:  ?Diagnosis Date  ? Atrial fibrillation ([place])   ? Barrett esophagus   ? Chronic kidney disease (CKD), stage III (moderate) (HCC)   ? Cirrhosis ([place])   ? Dysrhythmia   ? Atrial Fibrillation 2 years ago  ? Hypertension   ? Ischemic stroke ([place])  01/25/2018  ? Stroke [facility])   ? ? ?Past Surgical History:  ?Procedure Laterality Date  ? CESAREAN SECTION    ? ESOPHAGOGASTRODUODENOSCOPY (EGD) WITH PROPOFOL N/A 01/05/2022  ? Procedure: ESOPHAGOGASTRODUODENOSCOPY (EGD) WITH PROPOFOL;  Surgeon: Ronnette Juniper, MD;  Location: [facility];  Service: Gastroenterology;  Laterality: N/A;  ? EYE SURGERY    ? INTRAOCULAR LENS INSERTION Bilateral About 3 years ago  ? IR PARACENTESIS  02/12/2022  ? KYPHOPLASTY N/A 02/18/2020  ? Procedure: KYPHOPLASTY L2;  Surgeon: Melina Schools, MD;  Location: [facility];  Service: Orthopedics;  Laterality: N/A;  60 mins  ? TONSILLECTOMY    ? TUBAL LIGATION    ? ? ?Social history  ? reports that she has never smoked. She has never used smokeless tobacco. She reports that she does not drink alcohol and does not use drugs. ? ?Allergies  ?Allergen Reactions  ? Colchicine Other (See Comments)  ?  Stomach upset, diarrhea ?Other reaction(s): stomach upset  ? ? ?Family History  ?Problem Relation Age of Onset  ? Hypertension Mother   ? Hyperlipidemia Mother   ? Atrial fibrillation Mother   ? Hyperlipidemia Father   ? Atrial fibrillation Father   ? ? ? ?Prior to Admission medications   ?Medication Sig Start Date End Date Taking? Authorizing Provider  ?acetaminophen (TYLENOL) 500 MG tablet Take 1,000 mg by mouth every 6 (six) hours as needed for headache or mild pain.    [provider]  ?alendronate (FOSAMAX) 70 MG tablet Take 70 mg by mouth every Monday.    [provider]  ?allopurinol (ZYLOPRIM) 100 MG tablet Take 200 mg by mouth daily. 01/20/20   [provider]  ?  amiodarone (PACERONE) 200 MG tablet Take 200 mg by mouth in the morning and at bedtime.  02/27/18   [provider]  ?Cholecalciferol (VITAMIN D) 50 MCG (2000 UT) tablet Take 2,000 Units by mouth daily. 05/03/21   [provider]  ?iron polysaccharides (NIFEREX) 150 MG capsule Take 1 capsule (150 mg total) by mouth daily. 01/27/22   Kathie Dike, MD   ?levothyroxine (SYNTHROID) 200 MCG tablet Take 200 mcg by mouth every morning. 12/26/21   [provider]  ?midodrine (PROAMATINE) 10 MG tablet Take 1 tablet (10 mg total) by mouth 3 (three) times daily with meals. ?Patient not taking: Reported on 02/20/2022 02/01/22   Barb Merino, MD  ?Multiple Vitamin (MULTIVITAMIN) tablet Take 1 tablet by mouth daily.    [provider]  ?Multiple Vitamins-Minerals (PRESERVISION AREDS 2 PO) Take 1 capsule by mouth daily. Preservision AREDS-2 250 mg- 90 mg- 40 mg-1 mg    [provider]  ?ondansetron (ZOFRAN-ODT) 4 MG disintegrating tablet Take 4 mg by mouth every 4 (four) hours as needed for nausea or vomiting. 01/27/22   [provider]  ?pantoprazole (PROTONIX) 40 MG tablet Take 1 tablet (40 mg total) by mouth 2 (two) times daily before a meal. 01/26/22 02/25/22  Kathie Dike, MD  ?polyethylene glycol (MIRALAX / GLYCOLAX) 17 g packet Take 17 g by mouth daily as needed for mild constipation. 01/26/22   Kathie Dike, MD  ?sertraline (ZOLOFT) 50 MG tablet Take 50 mg by mouth daily. 11/01/21   [provider]  ? ? ?Physical Exam: ?There were no vitals filed for this visit. ? ?Constitutional: NAD, calm, comfortable ?Frail and debilitated.  Not in any distress.  On room air. ?There were no vitals filed for this visit. ?Eyes: PERRL, lids and conjunctivae normal ?ENMT: Mucous membranes are moist. Posterior pharynx clear of any exudate or lesions.Normal dentition.  ?Neck: normal, supple, no masses, no thyromegaly ?Respiratory: clear to auscultation bilaterally, no wheezing, no crackles. Normal respiratory effort. No accessory muscle use.  ?Cardiovascular: Regular rate and rhythm, no murmurs / rubs / gallops. No extremity edema. 2+ pedal pulses. No carotid bruits.  ?Abdomen: no tenderness, no masses palpated. No hepatosplenomegaly. Bowel sounds positive.  ?Distended but nontender. ?Musculoskeletal: no clubbing / cyanosis. No joint deformity  upper and lower extremities. Good ROM, no contractures. Normal muscle tone.  ?Skin: no rashes, lesions, ulcers. No induration ?Neurologic: CN 2-12 grossly intact. Sensation intact, DTR normal. Strength 5/5 in all 4.  ?Psychiatric: Normal judgment and insight. Alert and oriented x 3. Normal mood.  ? ? ? ?Labs on Admission: I have personally reviewed following labs and imaging studies ? ?CBC: ?Recent Labs  ?Lab 02/20/22 ?1408 02/21/22 ?0342 02/22/22 ?2423 02/23/22 ?0446  ?WBC 18.1* 17.9* 17.3* 17.9*  ?NEUTROABS 15.6*  --   --   --   ?HGB 10.6* 9.4* 9.1* 9.1*  ?HCT 33.8* 29.1* 28.5* 28.7*  ?MCV 81.4 80.6 80.7 82.0  ?PLT 305 241 211 191  ? ?Basic Metabolic Panel: ?Recent Labs  ?Lab 02/20/22 ?1408 02/21/22 ?0342 02/22/22 ?5361 02/23/22 ?0446  ?NA 129* 133* 134* 133*  ?K 5.2* 5.4* 4.3 3.5  ?CL 102 103 108 107  ?CO2 19* 21* 19* 17*  ?GLUCOSE 124* 80 76 93  ?BUN 45* 44* 41* 38*  ?CREATININE 2.66* 2.57* 2.35* 2.25*  ?CALCIUM 8.5* 8.3* 8.0* 7.8*  ? ?GFR: ?Estimated Creatinine Clearance: 22.4 mL/min (A) (by C-G formula based on SCr of 2.25 mg/dL (H)). ?Liver Function Tests: ?Recent Labs  ?Lab 02/20/22 ?  1408 02/22/22 ?0336 02/23/22 ?0446  ?AST 238* 172* 157*  ?ALT 94* 71* 66*  ?ALKPHOS 133* 106 100  ?BILITOT 1.5* 0.9 1.0  ?PROT 6.8 5.8* 5.9*  ?ALBUMIN 2.5* 2.5* 2.9*  ? ?Recent Labs  ?Lab 02/20/22 ?1408  ?LIPASE 43  ? ?No results for input(s): AMMONIA in the last 168 hours. ?Coagulation Profile: ?Recent Labs  ?Lab 02/20/22 ?1408 02/23/22 ?6384  ?INR 1.3* 1.5*  ? ?Cardiac Enzymes: ?No results for input(s): CKTOTAL, CKMB, CKMBINDEX, TROPONINI in the last 168 hours. ?BNP (last 3 results) ?No results for input(s): PROBNP in the last 8760 hours. ?HbA1C: ?No results for input(s): HGBA1C in the last 72 hours. ?CBG: ?No results for input(s): GLUCAP in the last 168 hours. ?Lipid Profile: ?No results for input(s): CHOL, HDL, LDLCALC, TRIG, CHOLHDL, LDLDIRECT in the last 72 hours. ?Thyroid Function Tests: ?No results for input(s): TSH,  T4TOTAL, FREET4, T3FREE, THYROIDAB in the last 72 hours. ?Anemia Panel: ?No results for input(s): VITAMINB12, FOLATE, FERRITIN, TIBC, IRON, RETICCTPCT in the last 72 hours. ?Urine analysis: ?   ?Component Value Dat

## 2022-02-23 NOTE — Progress Notes (Signed)
The patient is awaiting transfer to [facility] for TIPS procedure the daughter has been explained the risks of the TIPS including worsening hepatic encephalopathy but she states that she believes that would be better than the recurrent ascites and shortness of breath the patient is having.  The patient will be followed at [facility] by interventional radiology.  Nothing to do from a GI point of view ? ?I will sign off.  Please call if any further GI concerns or questions.  We would like to thank you for the opportunity to participate in the care of Diane Pruitt. ? ?

## 2022-02-23 NOTE — TOC Progression Note (Signed)
Transition of Care (TOC) - Progression Note  ? ? ?Patient Details  ?Name: Love Chowning ?MRN: [id number] ?Date of Birth: Aug 10, 1951 ? ?Transition of Care (TOC) CM/SW Contact  ?Kiani Wurtzel E Vinie Charity, LCSW ?Phone Number: ?02/23/2022, 10:04 AM ? ?Clinical Narrative:   CSW called the 3 SNFs in [place] that family was interested in.  ?[facility]- spoke to [facility], faxed referral.  ?[facility]- spoke to [facility], faxed referral.  ?[facility]- left VM for admissions.  ? ?Update per notes now awaiting transfer to [facility].  ? ? ? ?Expected Discharge Plan: [place] ?Barriers to Discharge: Continued Medical Work up ? ?Expected Discharge Plan and Services ?Expected Discharge Plan: [place] ?  ?  ?Post Acute Care Choice: [place] ?Living arrangements for the past 2 months: [place] ?                ?  ?  ?  ?  ?  ?  ?  ?  ?  ?  ? ? ?Social Determinants of Health (SDOH) Interventions ?  ? ?Readmission Risk Interventions ?   ? View : No data to display.  ?  ?  ?  ? ? ?

## 2022-02-23 NOTE — Discharge Summary (Signed)
Physician Discharge Summary  ?Diane Pruitt DHR:[id number] DOB: 1951/05/09 DOA: 02/20/2022 ? ?PCP: [name], MD ? ?Admit date: 02/20/2022 ?Discharge date: 02/23/2022 ? ?Admitted From: rehab ?Disposition:  transfer to [facility] for procedure  ? ?Condition:fair  ?CODE STATUS: FULL   ? ?Brief/Interim Summary: ?02/20/22 to ED w/ abdominal swelling, came to ED from rehab close by, via EMS which was called by the rehab facility. 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 Patient was recently seen at [facility] ER and had paracentesis with drainage of 4 - 5 L of ascitic fluid about a week prior to admission. Patient complains of dyspnea with exertion which is new and nausea with coffee-ground emesis 1 day prior to her admission. Chart review shows that she was admitted at [facility] from 04/03 through 04/06 for upper GI bleed and had an upper endoscopy which showed normal esophagus and portal hypertensive gastropathy. She denies having any abdominal pain, no fever, no chills, no cough, no headache, no dizziness, no lightheadedness, no changes in her bowel habits, no focal deficit or blurred vision.  ?Found to be AKI w/ Possible hepatorenal syndrome, also CDiff (+).  ?Pt underwent paracentesis 02/21/22.  ?02/22/22 no substantial improvement in AKI so nephrology was consulted. Renal US no concerns.  ?IR noted portal vein thrombosis, initial plan to anticoagulate and also get EGD to r/o varices however IR recommended transfer to [facility] today 02/23/22 for TIPS tomorrow 02/24/22 ? ?Discharge Diagnoses:  ?Principal Problem: ?  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  AKI (acute kidney injury) ([place]) ?  Acquired hypothyroidism ?  History of embolic stroke ?  Hyponatremia ?  Depression ?   Paroxysmal atrial fibrillation (HCC) ?  Hyperlipidemia ?  Chronic gouty arthritis ?  Hypercoagulable state ([place]) ?  Iron deficiency anemia ?  Portal hypertensive gastropathy (HCC) ?  Clostridium difficile infection ?  Hyperkalemia ?  Abnormal urinalysis ?  Portal vein thrombosis ?  Ascites due to alcoholic cirrhosis (HCC) ? ? ?ASSESSMENT & PLAN:  ? ?Paroxysmal atrial fibrillation (HCC) ?Not on anticoagulation due to recent bleeding ?Continue Amiodarone  ? ?Hyponatremia ?Most likely related to volume overload ?Improved to Na 134 02/22/22, down slightly to 133 today 02/23/22  ?Will monitor BMP ? ? ?Depression ?Stable ?Continue sertraline ? 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 ?She presented 02/20/22 for evaluation of worsening ascites without abdominal pain, emesis w/ bilious material, coffee grounds material ?IV Protonix for GI prophylaxis ?Rocephin was d/c 02/22/22  ?Hemoglobin stable since admission - held off on octreotide for now ?IR for paracentesis 02/21/22: Successful ultrasound guided paracentesis from the RUQ.  Yielded 6.6 L of clear yellow fluid. No immediate complications. The patient has required >/=2 paracenteses in a 30 day period and a screening evaluation by the [facility] has been arranged. ?Plan at this time to transfer to [facility] for TIPS procedure, at that time can remove portal vein thrombosis so will hold off on anticoagulation for now and can defer EGD  ? ?AKI (acute kidney injury) ([place]) ?Patient has a baseline serum creatinine of 1.3 and 02/20/22 at admission it was 2.66, 02/21/22 minimal improvement to 2.57 ?She had massive ascites but also intracellular fluid depletion ?Chart review shows  she was hypotensive after her last paracentesis at [facility]. Hypotensive as well after paracentesis but not severely  ?AKI  may be secondary to GI losses from diarrhea versus hepatorenal syndrome but not responding to IV fluid hydration, also receiving albumin  ?Repeat renal parameters in a.m. 02/22/22 not significantly improving - consulted nephrology ? ?Acquired hypothyroidism ?Stable ?Continue Synthroid ? ?Clostridium difficile infection ?Started on po vancomycin 02/20/22 ?C Diff likely contributing to leukocytosis, which is minimally trending down, monitor CBC ?Also likely contributing to volume depletion and subsequent AKI  ? ?Hypercoagulable state ([place]) ?No Rx VTE ppx d/t risk bleed ? ?Chronic gouty arthritis ?Reduced dose of allopurinol from 200 to 100 mg daily given renal fxn  ? ?History of embolic stroke ?Unable to be on ASA/Statin d/t bleed risk and liver risk respectively ? ?Portal hypertensive gastropathy (HCC) ?Recent EGD no bleeding varices, GI following  ? ?Iron deficiency anemia ?Continue po iron and monitor CBC ? ?Hyperkalemia ?Resolved based on BMP today 02/22/22  ? ?Abnormal urinalysis ?Possible contaminant (epithelial cells present) but either way added on UCx to previously collected urine, she was on ceftriaxone for SBP Ppx which would hopefully treat most UTI ?Ucx pending ? ?Portal vein thrombosis ?Non-obstructing  ?IR recommending transfer to [facility] for TIPS ?Hold off on anticoagulation for now given bleed risk and given IR can remove the thrombosis during TIPS procedure  ? ? ? ? ? ?Discharge Instructions ?Transfer pending  ? ?Allergies as of 02/23/2022   ? ?   Reactions  ? Colchicine Other (See Comments)  ? Stomach upset, diarrhea ?Other reaction(s): stomach upset  ? ?  ? ? ? ? ? ? ? ? ?Allergies  ?Allergen Reactions  ? Colchicine Other (See Comments)  ?  Stomach upset, diarrhea ?Other reaction(s): stomach upset  ? ? ?Consultations: ?Gastroenterology  ?Interventional Radiology ?Nephrology  ? ? ?Procedures/Studies: ?CT ABDOMEN PELVIS WO CONTRAST ? ?Result Date: 01/29/2022 ?CLINICAL DATA:  71 year old female status  post fall. Recently discontinued Eliquis. History of cirrhosis, ascites. Status post paracentesis on 01/04/2022. EXAM: CT ABDOMEN AND PELVIS WITHOUT CONTRAST TECHNIQUE: Multidetector CT imaging of the abdomen and pelvis was performed following the standard protocol without IV contrast. RADIATION DOSE REDUCTION: This exam was performed according to the departmental dose-optimization program which includes automated exposure control, adjustment of the mA and/or kV according to patient size and/or use of iterative reconstruction technique. COMPARISON:  CT Abdomen and Pelvis 01/03/2022 and earlier. FINDINGS: Lower chest: Stable mild to moderate cardiomegaly. No pericardial or pleural effusion. Stable mild lung base scarring and atelectasis. Hepatobiliary: Perihepatic ascites is ongoing and appears mildly increased from last month. Simple fluid density. Nodular, cirrhotic liver. No discrete liver lesion by noncontrast CT. Gallbladder is distended and difficult to delineate from adjacent ascites, similar to the appearance last month. Pancreas: Negative noncontrast pancreas. Spleen: Stable mild splenomegaly.  Adjacent ascites. Adrenals/Urinary Tract: Normal adrenal glands. Nonobstructed kidneys. Moderate volume bladder with adjacent ascites. Numerous pelvic phleboliths. Stomach/Bowel: No dilated large or small bowel. Abundant ascites. No discrete bowel inflammation is evident. No free air. Vascular/Lymphatic: Extensive Aortoiliac calcified atherosclerosis. Vascular patency is not evaluated in the absence of IV contrast. Normal caliber abdominal aorta. No lymphadenopathy identified. Reproductive: Negative. Other: Large volume simple fluid density ascites in the pelvis, slightly increased from last month. Musculoskeletal: Chronic left lateral 11th rib fracture. Previously augmented L2 compression fracture. No acute osseous abnormality identified. IMPRESSION: 1. Cirrhosis with moderate volume Ascites which appears mildly  progressed compared to the pre-paracentesis CT on  01/03/2022. 2. No other acute finding in the noncontrast abdomen and pelvis. Redemonstrated mild splenomegaly, cardiomegaly, chronic T2 compression fracture, aortic

## 2022-02-23 NOTE — Progress Notes (Deleted)
Physician Discharge Summary  ?Diane Pruitt PPI:[id number] DOB: April 21, 1951 DOA: 02/20/2022 ? ?PCP: [name], MD ? ?Admit date: 02/20/2022 ?Discharge date: 02/23/2022 ? ?Admitted From: rehab ?Disposition:  transfer to [facility] for procedure  ? ?Condition:fair  ?CODE STATUS: FULL   ? ?Brief/Interim Summary: ?02/20/22 to ED w/ abdominal swelling, came to ED from rehab close by, via EMS which was called by the rehab facility. 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 Patient was recently seen at [facility] ER and had paracentesis with drainage of 4 - 5 L of ascitic fluid about a week prior to admission. Patient complains of dyspnea with exertion which is new and nausea with coffee-ground emesis 1 day prior to her admission. Chart review shows that she was admitted at [facility] from 04/03 through 04/06 for upper GI bleed and had an upper endoscopy which showed normal esophagus and portal hypertensive gastropathy. She denies having any abdominal pain, no fever, no chills, no cough, no headache, no dizziness, no lightheadedness, no changes in her bowel habits, no focal deficit or blurred vision.  ?Found to be AKI w/ Possible hepatorenal syndrome, also CDiff (+).  ?Pt underwent paracentesis 02/21/22.  ?02/22/22 no substantial improvement in AKI so nephrology was consulted. Renal US no concerns.  ?IR noted portal vein thrombosis, initial plan to anticoagulate and also get EGD to r/o varices however IR recommended transfer to [facility] today 02/23/22 for TIPS tomorrow 02/24/22 ? ?Discharge Diagnoses:  ?Principal Problem: ?  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  AKI (acute kidney injury) ([place]) ?  Acquired hypothyroidism ?  History of embolic stroke ?  Hyponatremia ?  Depression ?   Paroxysmal atrial fibrillation (HCC) ?  Hyperlipidemia ?  Chronic gouty arthritis ?  Hypercoagulable state ([place]) ?  Iron deficiency anemia ?  Portal hypertensive gastropathy (HCC) ?  Clostridium difficile infection ?  Hyperkalemia ?  Abnormal urinalysis ?  Portal vein thrombosis ?  Ascites due to alcoholic cirrhosis (HCC) ? ? ?ASSESSMENT & PLAN:  ? ?Paroxysmal atrial fibrillation (HCC) ?Not on anticoagulation due to recent bleeding ?Continue Amiodarone  ? ?Hyponatremia ?Most likely related to volume overload ?Improved to Na 134 02/22/22, down slightly to 133 today 02/23/22  ?Will monitor BMP ? ? ?Depression ?Stable ?Continue sertraline ? 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 ?She presented 02/20/22 for evaluation of worsening ascites without abdominal pain, emesis w/ bilious material, coffee grounds material ?IV Protonix for GI prophylaxis ?Rocephin was d/c 02/22/22  ?Hemoglobin stable since admission - held off on octreotide for now ?IR for paracentesis 02/21/22: Successful ultrasound guided paracentesis from the RUQ.  Yielded 6.6 L of clear yellow fluid. No immediate complications. The patient has required >/=2 paracenteses in a 30 day period and a screening evaluation by the [facility] has been arranged. ?Plan at this time to transfer to [facility] for TIPS procedure, at that time can remove portal vein thrombosis so will hold off on anticoagulation for now and can defer EGD  ? ?AKI (acute kidney injury) ([place]) ?Patient has a baseline serum creatinine of 1.3 and 02/20/22 at admission it was 2.66, 02/21/22 minimal improvement to 2.57 ?She had massive ascites but also intracellular fluid depletion ?Chart review shows  she was hypotensive after her last paracentesis at [facility]. Hypotensive as well after paracentesis but not severely  ?AKI  may be secondary to GI losses from diarrhea versus hepatorenal syndrome but not responding to IV fluid hydration, also receiving albumin  ?Repeat renal parameters in a.m. 02/22/22 not significantly improving - consulted nephrology ? ?Acquired hypothyroidism ?Stable ?Continue Synthroid ? ?Clostridium difficile infection ?Started on po vancomycin 02/20/22 ?C Diff likely contributing to leukocytosis, which is minimally trending down, monitor CBC ?Also likely contributing to volume depletion and subsequent AKI  ? ?Hypercoagulable state ([place]) ?No Rx VTE ppx d/t risk bleed ? ?Chronic gouty arthritis ?Reduced dose of allopurinol from 200 to 100 mg daily given renal fxn  ? ?History of embolic stroke ?Unable to be on ASA/Statin d/t bleed risk and liver risk respectively ? ?Portal hypertensive gastropathy (HCC) ?Recent EGD no bleeding varices, GI following  ? ?Iron deficiency anemia ?Continue po iron and monitor CBC ? ?Hyperkalemia ?Resolved based on BMP today 02/22/22  ? ?Abnormal urinalysis ?Possible contaminant (epithelial cells present) but either way added on UCx to previously collected urine, she was on ceftriaxone for SBP Ppx which would hopefully treat most UTI ?Ucx pending ? ?Portal vein thrombosis ?Non-obstructing  ?IR recommending transfer [facility] for [facility]IPS ?Hold off on anticoagulation for now given bleed risk and given IR can remove the thrombosis during TIPS procedure  ? ? ? ? ? ?Discharge Instructions ?Transfer pending  ? ?Allergies as of 02/23/2022   ? ?   Reactions  ? Colchicine Other (See Comments)  ? Stomach upset, diarrhea ?Other reaction(s): stomach upset  ? ?  ? ? ? ? ? ? ? ? ?Allergies  ?Allergen Reactions  ? Colchicine Other (See Comments)  ?  Stomach upset, diarrhea ?Other reaction(s): stomach upset  ? ? ?Consultations: ?Gastroenterology  ?Interventional Radiology ?Nephrology  ? ? ?Procedures/Studies: ?CT ABDOMEN PELVIS WO CONTRAST ? ?Result Date: 01/29/2022 ?CLINICAL DATA:  71 year old female status  post fall. Recently discontinued Eliquis. History of cirrhosis, ascites. Status post paracentesis on 01/04/2022. EXAM: CT ABDOMEN AND PELVIS WITHOUT CONTRAST TECHNIQUE: Multidetector CT imaging of the abdomen and pelvis was performed following the standard protocol without IV contrast. RADIATION DOSE REDUCTION: This exam was performed according to the departmental dose-optimization program which includes automated exposure control, adjustment of the mA and/or kV according to patient size and/or use of iterative reconstruction technique. COMPARISON:  CT Abdomen and Pelvis 01/03/2022 and earlier. FINDINGS: Lower chest: Stable mild to moderate cardiomegaly. No pericardial or pleural effusion. Stable mild lung base scarring and atelectasis. Hepatobiliary: Perihepatic ascites is ongoing and appears mildly increased from last month. Simple fluid density. Nodular, cirrhotic liver. No discrete liver lesion by noncontrast CT. Gallbladder is distended and difficult to delineate from adjacent ascites, similar to the appearance last month. Pancreas: Negative noncontrast pancreas. Spleen: Stable mild splenomegaly.  Adjacent ascites. Adrenals/Urinary Tract: Normal adrenal glands. Nonobstructed kidneys. Moderate volume bladder with adjacent ascites. Numerous pelvic phleboliths. Stomach/Bowel: No dilated large or small bowel. Abundant ascites. No discrete bowel inflammation is evident. No free air. Vascular/Lymphatic: Extensive Aortoiliac calcified atherosclerosis. Vascular patency is not evaluated in the absence of IV contrast. Normal caliber abdominal aorta. No lymphadenopathy identified. Reproductive: Negative. Other: Large volume simple fluid density ascites in the pelvis, slightly increased from last month. Musculoskeletal: Chronic left lateral 11th rib fracture. Previously augmented L2 compression fracture. No acute osseous abnormality identified. IMPRESSION: 1. Cirrhosis with moderate volume Ascites which appears mildly  progressed compared to the pre-paracentesis CT on  01/03/2022. 2. No other acute finding in the noncontrast abdomen and pelvis. Redemonstrated mild splenomegaly, cardiomegaly, chronic T2 compression fracture, aortic

## 2022-02-23 NOTE — Care Management Important Message (Signed)
Important Message ? ?Patient Details  ?Name: Diane Pruitt ?MRN: [id number] ?Date of Birth: 12/06/1950 ? ? ?Medicare Important Message Given:  N/A - LOS <3 / Initial given by admissions ? ? ? ? ?Juliann Pulse A Athea Haley ?02/23/2022, 9:26 AM ?

## 2022-02-23 NOTE — Progress Notes (Signed)
Report given to [employer]. Patient to go to [facility] Room 2. Waiting for transportation at this time. ?

## 2022-02-23 NOTE — Consult Note (Signed)
? ? ? ?Chief Complaint: ?Cirrhosis with portal hypertension and ascites ? ?Referring Physician(s): ?[name] ?Emeterio Reeve ? ?Supervising Physician: Michaelle Birks ? ?Patient Status: [facility] - In-pt ? ?History of Present Illness: ?Diane Pruitt is a 71 y.o. female with medical issues including depression, paroxysmal atrial fibrillation ischemic stroke, alcoholic liver cirrhosis and chronic kidney disease stage IIIb. ? ?She is known to our service for previous paracentesis procedures.  ? ?She has undergone 2 paracentesis procedures in a month, 4/17 and again on 4/26. Both large volume. ? ?She was admitted to [facility] on 02/20/2022 with abdominal pain. ? ?Liver doppler done yesterday showed = ?1. 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 ?2. Marked elevation of portal venous pressures with reversal of ?flow. ?3. 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 ? ?We are asked to evaluate her for TIPS and mechanical thrombectomy of portal vein thrombus. ? ?She is awake and oriented at the time of our visit with no signs of encephalopathy. ? ?MELD calculated today using most recent labs = 19 ?Na MELD is 22 ?Child Class B ? ? ?Past Medical History:  ?Diagnosis Date  ? Atrial fibrillation ([place])   ? Barrett esophagus   ? Chronic kidney disease (CKD), stage III (moderate) (HCC)   ? Cirrhosis ([place])   ? Dysrhythmia   ? Atrial Fibrillation 2 years ago  ? Hypertension   ? Ischemic stroke ([place]) 01/25/2018  ? Stroke [facility])   ? ? ?Past Surgical History:  ?Procedure Laterality Date  ? CESAREAN SECTION    ? ESOPHAGOGASTRODUODENOSCOPY (EGD) WITH PROPOFOL N/A 01/05/2022  ? Procedure: ESOPHAGOGASTRODUODENOSCOPY (EGD) WITH PROPOFOL;  Surgeon: Ronnette Juniper, MD;  Location: [facility];  Service: Gastroenterology;  Laterality: N/A;  ? EYE SURGERY    ? INTRAOCULAR LENS INSERTION Bilateral About 3 years ago  ? IR PARACENTESIS  02/12/2022  ? KYPHOPLASTY N/A 02/18/2020  ? Procedure: KYPHOPLASTY L2;   Surgeon: Melina Schools, MD;  Location: [facility];  Service: Orthopedics;  Laterality: N/A;  60 mins  ? TONSILLECTOMY    ? TUBAL LIGATION    ? ? ?Allergies: ?Colchicine ? ?Medications: ?Prior to Admission medications   ?Medication Sig Start Date End Date Taking? Authorizing Provider  ?alendronate (FOSAMAX) 70 MG tablet Take 70 mg by mouth every Monday.   Yes [provider]  ?allopurinol (ZYLOPRIM) 100 MG tablet Take 200 mg by mouth daily. 01/20/20  Yes [provider]  ?amiodarone (PACERONE) 200 MG tablet Take 200 mg by mouth in the morning and at bedtime.  02/27/18  Yes [provider]  ?Cholecalciferol (VITAMIN D) 50 MCG (2000 UT) tablet Take 2,000 Units by mouth daily. 05/03/21  Yes [provider]  ?iron polysaccharides (NIFEREX) 150 MG capsule Take 1 capsule (150 mg total) by mouth daily. 01/27/22  Yes Kathie Dike, MD  ?levothyroxine (SYNTHROID) 200 MCG tablet Take 200 mcg by mouth every morning. 12/26/21  Yes [provider]  ?Multiple Vitamin (MULTIVITAMIN) tablet Take 1 tablet by mouth daily.   Yes [provider]  ?Multiple Vitamins-Minerals (PRESERVISION AREDS 2 PO) Take 1 capsule by mouth daily. Preservision AREDS-2 250 mg- 90 mg- 40 mg-1 mg   Yes [provider]  ?sertraline (ZOLOFT) 50 MG tablet Take 50 mg by mouth daily. 11/01/21  Yes [provider]  ?acetaminophen (TYLENOL) 500 MG tablet Take 1,000 mg by mouth every 6 (six) hours as needed for headache or mild pain.    [provider]  ?midodrine (PROAMATINE) 10 MG tablet Take 1  tablet (10 mg total) by mouth 3 (three) times daily with meals. ?Patient not taking: Reported on 02/20/2022 02/01/22   Barb Merino, MD  ?ondansetron (ZOFRAN-ODT) 4 MG disintegrating tablet Take 4 mg by mouth every 4 (four) hours as needed for nausea or vomiting. 01/27/22   [provider]  ?pantoprazole (PROTONIX) 40 MG tablet Take 1 tablet (40 mg total) by mouth 2 (two) times daily before a meal.  01/26/22 02/25/22  Kathie Dike, MD  ?polyethylene glycol (MIRALAX / GLYCOLAX) 17 g packet Take 17 g by mouth daily as needed for mild constipation. 01/26/22   Kathie Dike, MD  ?  ? ?Family History  ?Problem Relation Age of Onset  ? Hypertension Mother   ? Hyperlipidemia Mother   ? Atrial fibrillation Mother   ? Hyperlipidemia Father   ? Atrial fibrillation Father   ? ? ?Social History  ? ?Socioeconomic History  ? Marital status: Divorced  ?  Spouse name: Not on file  ? Number of children: Not on file  ? Years of education: Not on file  ? Highest education level: Not on file  ?Occupational History  ? Not on file  ?Tobacco Use  ? Smoking status: Never  ? Smokeless tobacco: Never  ?Vaping Use  ? Vaping Use: Never used  ?Substance and Sexual Activity  ? Alcohol use: Never  ? Drug use: Never  ? Sexual activity: Not on file  ?Other Topics Concern  ? Not on file  ?Social History Narrative  ? Not on file  ? ?Social Determinants of Health  ? ?Financial Resource Strain: Not on file  ?Food Insecurity: Not on file  ?Transportation Needs: Not on file  ?Physical Activity: Not on file  ?Stress: Not on file  ?Social Connections: Not on file  ? ? ? ?Review of Systems: A 12 point ROS discussed and pertinent positives are indicated.  All other systems are negative. ? ?Review of Systems  ?Constitutional:  Positive for activity change, appetite change, fatigue and unexpected weight change.  ?Gastrointestinal:  Positive for abdominal distention, abdominal pain and nausea.  ? ?Vital Signs: ?BP (!) 95/49 (BP Location: Right Arm)   Pulse 67   Temp 97.8 ?F (36.6 ?C)   Resp 17   Ht '5\' 2"'$  (1.575 m)   Wt 170 lb (77.1 kg)   SpO2 94%   BMI 31.09 kg/m?  ? ?Physical Exam ?Vitals reviewed.  ?Constitutional:   ?   Appearance: Normal appearance.  ?HENT:  ?   Head: Normocephalic and atraumatic.  ?Eyes:  ?   Extraocular Movements: Extraocular movements intact.  ?Cardiovascular:  ?   Rate and Rhythm: Normal rate and regular rhythm.   ?Pulmonary:  ?   Effort: Pulmonary effort is normal. No respiratory distress.  ?   Breath sounds: Normal breath sounds.  ?Abdominal:  ?   General: There is distension.  ?   Palpations: Abdomen is soft.  ?   Tenderness: There is no abdominal tenderness.  ?Musculoskeletal:     ?   General: Normal range of motion.  ?   Cervical back: Normal range of motion.  ?Skin: ?   General: Skin is warm and dry.  ?Neurological:  ?   General: No focal deficit present.  ?   Mental Status: She is alert and oriented to person, place, and time.  ?Psychiatric:     ?   Mood and Affect: Mood normal.     ?   Behavior: Behavior normal.     ?  Thought Content: Thought content normal.     ?   Judgment: Judgment normal.  ? ? ?Imaging: ?CT ABDOMEN PELVIS WO CONTRAST ? ?Result Date: 01/29/2022 ?CLINICAL DATA:  72 year old female status post fall. Recently discontinued Eliquis. History of cirrhosis, ascites. Status post paracentesis on 01/04/2022. EXAM: CT ABDOMEN AND PELVIS WITHOUT CONTRAST TECHNIQUE: Multidetector CT imaging of the abdomen and pelvis was performed following the standard protocol without IV contrast. RADIATION DOSE REDUCTION: This exam was performed according to the departmental dose-optimization program which includes automated exposure control, adjustment of the mA and/or kV according to patient size and/or use of iterative reconstruction technique. COMPARISON:  CT Abdomen and Pelvis 01/03/2022 and earlier. FINDINGS: Lower chest: Stable mild to moderate cardiomegaly. No pericardial or pleural effusion. Stable mild lung base scarring and atelectasis. Hepatobiliary: Perihepatic ascites is ongoing and appears mildly increased from last month. Simple fluid density. Nodular, cirrhotic liver. No discrete liver lesion by noncontrast CT. Gallbladder is distended and difficult to delineate from adjacent ascites, similar to the appearance last month. Pancreas: Negative noncontrast pancreas. Spleen: Stable mild splenomegaly.  Adjacent  ascites. Adrenals/Urinary Tract: Normal adrenal glands. Nonobstructed kidneys. Moderate volume bladder with adjacent ascites. Numerous pelvic phleboliths. Stomach/Bowel: No dilated large or small bowel. Abundant ascites. N

## 2022-02-23 NOTE — Progress Notes (Signed)
I have attempted to speak with pt RN via phone and epic chat regarding transfer to [facility] for TIPS procedure tomorrow with no answer. Per bed control, [facility] RN needs to contact carelink to have pt transferred to [facility] for admission.  ?Procedure is planned for tomorrow at 10am in IR at [facility]. ?

## 2022-02-24 ENCOUNTER — Encounter (HOSPITAL_COMMUNITY)
Admission: AD | Disposition: A | Discharge status: Skilled Nursing Facility | Payer: Self-pay | Source: Other Acute Inpatient Hospital | Attending: Internal Medicine

## 2022-02-24 ENCOUNTER — Inpatient Hospital Stay (HOSPITAL_COMMUNITY): Payer: Medicare HMO | Admitting: Anesthesiology

## 2022-02-24 ENCOUNTER — Inpatient Hospital Stay (HOSPITAL_COMMUNITY): Payer: Medicare HMO

## 2022-02-24 ENCOUNTER — Encounter (HOSPITAL_COMMUNITY): Payer: Self-pay | Admitting: Internal Medicine

## 2022-02-24 ENCOUNTER — Inpatient Hospital Stay: Admit: 2022-02-24 | Payer: Medicare HMO | Admitting: Interventional Radiology

## 2022-02-24 DIAGNOSIS — I480 Paroxysmal atrial fibrillation: Secondary | ICD-10-CM | POA: Diagnosis not present

## 2022-02-24 DIAGNOSIS — K7031 Alcoholic cirrhosis of liver with ascites: Secondary | ICD-10-CM | POA: Diagnosis not present

## 2022-02-24 DIAGNOSIS — K7460 Unspecified cirrhosis of liver: Secondary | ICD-10-CM

## 2022-02-24 DIAGNOSIS — I4891 Unspecified atrial fibrillation: Secondary | ICD-10-CM

## 2022-02-24 DIAGNOSIS — I81 Portal vein thrombosis: Secondary | ICD-10-CM

## 2022-02-24 DIAGNOSIS — I272 Pulmonary hypertension, unspecified: Secondary | ICD-10-CM

## 2022-02-24 DIAGNOSIS — I951 Orthostatic hypotension: Secondary | ICD-10-CM | POA: Diagnosis not present

## 2022-02-24 HISTORY — PX: IR PARACENTESIS: IMG2679

## 2022-02-24 HISTORY — PX: RADIOLOGY WITH ANESTHESIA: SHX6223

## 2022-02-24 HISTORY — PX: IR TIPS: IMG2295

## 2022-02-24 HISTORY — PX: IR US GUIDE VASC ACCESS RIGHT: IMG2390

## 2022-02-24 LAB — POCT I-STAT 7, (LYTES, BLD GAS, ICA,H+H)
Acid-base deficit: 6 mmol/L — ABNORMAL HIGH (ref 0.0–2.0)
Acid-base deficit: 6 mmol/L — ABNORMAL HIGH (ref 0.0–2.0)
Bicarbonate: 20.2 mmol/L (ref 20.0–28.0)
Bicarbonate: 18.3 mmol/L — ABNORMAL LOW (ref 20.0–28.0)
Calcium, Ion: 1.16 mmol/L (ref 1.15–1.40)
Calcium, Ion: 1.1 mmol/L — ABNORMAL LOW (ref 1.15–1.40)
HCT: 31 % — ABNORMAL LOW (ref 36.0–46.0)
HCT: 27 % — ABNORMAL LOW (ref 36.0–46.0)
Hemoglobin: 10.5 g/dL — ABNORMAL LOW (ref 12.0–15.0)
Hemoglobin: 9.2 g/dL — ABNORMAL LOW (ref 12.0–15.0)
O2 Saturation: 92 %
O2 Saturation: 91 %
Potassium: 3 mmol/L — ABNORMAL LOW (ref 3.5–5.1)
Potassium: 2.9 mmol/L — ABNORMAL LOW (ref 3.5–5.1)
Sodium: 136 mmol/L (ref 135–145)
Sodium: 136 mmol/L (ref 135–145)
TCO2: 21 mmol/L — ABNORMAL LOW (ref 22–32)
TCO2: 19 mmol/L — ABNORMAL LOW (ref 22–32)
pCO2 arterial: 43.8 mmHg (ref 32–48)
pCO2 arterial: 31.8 mmHg — ABNORMAL LOW (ref 32–48)
pH, Arterial: 7.27 — ABNORMAL LOW (ref 7.35–7.45)
pH, Arterial: 7.37 (ref 7.35–7.45)
pO2, Arterial: 72 mmHg — ABNORMAL LOW (ref 83–108)
pO2, Arterial: 61 mmHg — ABNORMAL LOW (ref 83–108)

## 2022-02-24 LAB — PROTIME-INR
INR: 1.5 — ABNORMAL HIGH (ref 0.8–1.2)
Prothrombin Time: 17.9 seconds — ABNORMAL HIGH (ref 11.4–15.2)

## 2022-02-24 LAB — COMPREHENSIVE METABOLIC PANEL WITH GFR
ALT: 63 U/L — ABNORMAL HIGH (ref 0–44)
AST: 154 U/L — ABNORMAL HIGH (ref 15–41)
Albumin: 2.9 g/dL — ABNORMAL LOW (ref 3.5–5.0)
Alkaline Phosphatase: 101 U/L (ref 38–126)
Anion gap: 9 (ref 5–15)
BUN: 33 mg/dL — ABNORMAL HIGH (ref 8–23)
CO2: 17 mmol/L — ABNORMAL LOW (ref 22–32)
Calcium: 7.9 mg/dL — ABNORMAL LOW (ref 8.9–10.3)
Chloride: 106 mmol/L (ref 98–111)
Creatinine, Ser: 2.07 mg/dL — ABNORMAL HIGH (ref 0.44–1.00)
GFR, Estimated: 25 mL/min — ABNORMAL LOW (ref >60)
Glucose, Bld: 95 mg/dL (ref 70–99)
Potassium: 3.1 mmol/L — ABNORMAL LOW (ref 3.5–5.1)
Sodium: 132 mmol/L — ABNORMAL LOW (ref 135–145)
Total Bilirubin: 1 mg/dL (ref 0.3–1.2)
Total Protein: 5.7 g/dL — ABNORMAL LOW (ref 6.5–8.1)

## 2022-02-24 LAB — CBC
HCT: 28 % — ABNORMAL LOW (ref 36.0–46.0)
Hemoglobin: 9.1 g/dL — ABNORMAL LOW (ref 12.0–15.0)
MCH: 26.4 pg (ref 26.0–34.0)
MCHC: 32.5 g/dL (ref 30.0–36.0)
MCV: 81.2 fL (ref 80.0–100.0)
Platelets: 173 10*3/uL (ref 150–400)
RBC: 3.45 MIL/uL — ABNORMAL LOW (ref 3.87–5.11)
RDW: 20.6 % — ABNORMAL HIGH (ref 11.5–15.5)
WBC: 17.4 10*3/uL — ABNORMAL HIGH (ref 4.0–10.5)
nRBC: 0.1 % (ref 0.0–0.2)

## 2022-02-24 LAB — URINE CULTURE: Culture: >=100000 — AB

## 2022-02-24 SURGERY — IR WITH ANESTHESIA
Anesthesia: General

## 2022-02-24 MED ORDER — LIDOCAINE 2% (20 MG/ML) 5 ML SYRINGE
INTRAMUSCULAR | Status: DC | PRN
  Administered 2022-02-24: 60 mg via INTRAVENOUS

## 2022-02-24 MED ORDER — OXYCODONE HCL 5 MG/5ML PO SOLN: 5.0000 mg | Freq: Once | ORAL | Status: DC | PRN

## 2022-02-24 MED ORDER — ACETAMINOPHEN 160 MG/5ML PO SOLN: 325.0000 mg | ORAL | Status: DC | PRN

## 2022-02-24 MED ORDER — SODIUM CHLORIDE 0.9 % IV SOLN
2.0000 g | Freq: Once | INTRAVENOUS | Status: AC
  Administered 2022-02-24: 2 g via INTRAVENOUS
  Filled 2022-02-24: qty 2

## 2022-02-24 MED ORDER — IODIXANOL 320 MG/ML IV SOLN
100.0000 mL | Freq: Once | INTRAVENOUS | Status: AC | PRN
  Administered 2022-02-24: 70 mL via INTRAVENOUS

## 2022-02-24 MED ORDER — ALBUMIN HUMAN 5 % IV SOLN: INTRAVENOUS | Status: DC | PRN

## 2022-02-24 MED ORDER — AMISULPRIDE (ANTIEMETIC) 5 MG/2ML IV SOLN: 10.0000 mg | Freq: Once | INTRAVENOUS | Status: DC | PRN

## 2022-02-24 MED ORDER — FENTANYL CITRATE (PF) 100 MCG/2ML IJ SOLN: 25.0000 ug | INTRAMUSCULAR | Status: DC | PRN

## 2022-02-24 MED ORDER — MIDODRINE HCL 5 MG PO TABS
15.0000 mg | ORAL_TABLET | Freq: Three times a day (TID) | ORAL | Status: DC
  Administered 2022-02-24 – 2022-03-07 (×31): 15 mg via ORAL
  Filled 2022-02-24 (×31): qty 3

## 2022-02-24 MED ORDER — FUROSEMIDE 10 MG/ML IJ SOLN
20.0000 mg | Freq: Once | INTRAMUSCULAR | Status: AC
Start: 2022-02-24 — End: 2022-02-24
  Administered 2022-02-24: 20 mg via INTRAVENOUS
  Filled 2022-02-24: qty 2

## 2022-02-24 MED ORDER — IODIXANOL 320 MG/ML IV SOLN
100.0000 mL | Freq: Once | INTRAVENOUS | Status: AC | PRN
  Administered 2022-02-24: 30 mL via INTRAVENOUS

## 2022-02-24 MED ORDER — ONDANSETRON HCL 4 MG/2ML IJ SOLN
INTRAMUSCULAR | Status: DC | PRN
  Administered 2022-02-24: 4 mg via INTRAVENOUS

## 2022-02-24 MED ORDER — EPHEDRINE SULFATE-NACL 50-0.9 MG/10ML-% IV SOSY
PREFILLED_SYRINGE | INTRAVENOUS | Status: DC | PRN
Start: 2022-02-24 — End: 2022-02-24
  Administered 2022-02-24 (×3): 5 mg via INTRAVENOUS

## 2022-02-24 MED ORDER — CHLORHEXIDINE GLUCONATE CLOTH 2 % EX PADS
6.0000 | MEDICATED_PAD | Freq: Every day | CUTANEOUS | Status: DC
  Administered 2022-02-24 – 2022-03-07 (×11): 6 via TOPICAL

## 2022-02-24 MED ORDER — ACETAMINOPHEN 325 MG PO TABS: 325.0000 mg | ORAL_TABLET | ORAL | Status: DC | PRN

## 2022-02-24 MED ORDER — PHENYLEPHRINE HCL-NACL 20-0.9 MG/250ML-% IV SOLN
INTRAVENOUS | Status: DC | PRN
  Administered 2022-02-24: 50 ug/min via INTRAVENOUS
  Administered 2022-02-24: 75 ug/min via INTRAVENOUS

## 2022-02-24 MED ORDER — FENTANYL CITRATE (PF) 250 MCG/5ML IJ SOLN
INTRAMUSCULAR | Status: AC
  Filled 2022-02-24: qty 5

## 2022-02-24 MED ORDER — CHLORHEXIDINE GLUCONATE 0.12 % MT SOLN
OROMUCOSAL | Status: AC
Start: 2022-02-24 — End: 2022-02-24
  Administered 2022-02-24: 15 mL via OROMUCOSAL
  Filled 2022-02-24: qty 15

## 2022-02-24 MED ORDER — ROCURONIUM BROMIDE 10 MG/ML (PF) SYRINGE
PREFILLED_SYRINGE | INTRAVENOUS | Status: DC | PRN
  Administered 2022-02-24: 60 mg via INTRAVENOUS

## 2022-02-24 MED ORDER — ORAL CARE MOUTH RINSE: 15.0000 mL | Freq: Once | OROMUCOSAL | Status: AC

## 2022-02-24 MED ORDER — LIDOCAINE HCL 1 % IJ SOLN
INTRAMUSCULAR | Status: AC
  Filled 2022-02-24: qty 20

## 2022-02-24 MED ORDER — ALBUMIN HUMAN 5 % IV SOLN
12.5000 g | Freq: Once | INTRAVENOUS | Status: AC
  Administered 2022-02-24: 12.5 g via INTRAVENOUS

## 2022-02-24 MED ORDER — POTASSIUM CHLORIDE CRYS ER 20 MEQ PO TBCR
40.0000 meq | EXTENDED_RELEASE_TABLET | Freq: Two times a day (BID) | ORAL | Status: AC
  Administered 2022-02-24 – 2022-02-25 (×3): 40 meq via ORAL
  Filled 2022-02-24 (×3): qty 2

## 2022-02-24 MED ORDER — POTASSIUM CHLORIDE CRYS ER 20 MEQ PO TBCR
40.0000 meq | EXTENDED_RELEASE_TABLET | Freq: Three times a day (TID) | ORAL | Status: DC
Start: 2022-02-24 — End: 2022-02-24

## 2022-02-24 MED ORDER — PHENYLEPHRINE 80 MCG/ML (10ML) SYRINGE FOR IV PUSH (FOR BLOOD PRESSURE SUPPORT)
PREFILLED_SYRINGE | INTRAVENOUS | Status: DC | PRN
  Administered 2022-02-24: 80 ug via INTRAVENOUS

## 2022-02-24 MED ORDER — ACETAMINOPHEN 10 MG/ML IV SOLN: 1000.0000 mg | Freq: Once | INTRAVENOUS | Status: DC | PRN

## 2022-02-24 MED ORDER — CHLORHEXIDINE GLUCONATE 0.12 % MT SOLN: 15.0000 mL | Freq: Once | OROMUCOSAL | Status: AC

## 2022-02-24 MED ORDER — OXYCODONE HCL 5 MG PO TABS: 5.0000 mg | ORAL_TABLET | Freq: Once | ORAL | Status: DC | PRN

## 2022-02-24 MED ORDER — PROPOFOL 10 MG/ML IV BOLUS
INTRAVENOUS | Status: DC | PRN
  Administered 2022-02-24: 100 mg via INTRAVENOUS

## 2022-02-24 MED ORDER — SODIUM CHLORIDE 0.9 % IV SOLN: INTRAVENOUS | Status: DC

## 2022-02-24 MED ORDER — ALBUMIN HUMAN 5 % IV SOLN
INTRAVENOUS | Status: AC
  Filled 2022-02-24: qty 250

## 2022-02-24 MED ORDER — LACTATED RINGERS IV SOLN
INTRAVENOUS | Status: DC | PRN
Start: 2022-02-24 — End: 2022-02-24

## 2022-02-24 MED ORDER — LACTATED RINGERS IV SOLN: INTRAVENOUS | Status: DC | PRN

## 2022-02-24 NOTE — Progress Notes (Signed)
I gave her Midodrine with a sip of water this am since she had been orthostatic with PT when she was sitting on the [facility]. ?

## 2022-02-24 NOTE — Progress Notes (Signed)
Pt received back from PACU via bed, family at bedside. C/o not being able to breathe but sats are 93-94 on 4L nasal cannula. Pt lying flat.  ?

## 2022-02-24 NOTE — Sedation Documentation (Signed)
Pt. Transported to PACU with CRNA. Bedside report given to PACU, RN. Dressings clean, dry, intact.  ?

## 2022-02-24 NOTE — Transfer of Care (Signed)
Immediate Anesthesia Transfer of Care Note ? ?Patient: Diane Pruitt ? ?Procedure(s) Performed: TIPS ? ?Patient Location: PACU ? ?Anesthesia Type:General ? ?Level of Consciousness: drowsy ? ?Airway & Oxygen Therapy: Patient Spontanous Breathing and Patient connected to face mask oxygen ? ?Post-op Assessment: Report given to RN and Post -op Vital signs reviewed and stable ? ?Post vital signs: Reviewed and stable MAPs a little low gave phenylephrine and left fluids running [facility]. [facility] notified ? ?Last Vitals:  ?Vitals Value Taken Time  ?BP    ?Temp    ?Pulse 75 02/24/22 1433  ?Resp 22 02/24/22 1434  ?SpO2 77 % 02/24/22 1433  ?Vitals shown include unvalidated device data. ? ?Last Pain:  ?Vitals:  ? 02/24/22 0834  ?TempSrc: Oral  ?PainSc:   ?   ? ?  ? ?Complications: No notable events documented. ?

## 2022-02-24 NOTE — Anesthesia Procedure Notes (Signed)
Arterial Line Insertion ?Start/End4/29/2023 9:15 AM, 02/24/2022 9:30 AM ?Performed by: Effie Berkshire, MD, anesthesiologist ? Lidocaine 1% used for infiltration ?Left, radial was placed ?Catheter size: 20 G ? ?Attempts: 2 ?Procedure performed without using ultrasound guided technique. ?Following insertion, dressing applied and Biopatch. ?Post procedure assessment: normal ? ?Patient tolerated the procedure well with no immediate complications. ? ? ?

## 2022-02-24 NOTE — Evaluation (Signed)
Occupational Therapy Evaluation ?Patient Details ?Name: Diane Pruitt ?MRN: [id number] ?DOB: 30-May-1951 ?Today's Date: 02/24/2022 ? ? ?History of Present Illness The pt is a 71 yo female presenitng 4/28 from [facility] for TIPS procedure on 4/29. The pt had been admitted 4/25 - 4/28 due to hypertensive gastropathy. Of note, pt also admitted 4/3-4/6 for upper GI bleed and was d/c to SNF. PMH includes: depression, afib, ischemic stroke, CKD III, and alcoholic liver cirrhosis.  ? ?Clinical Impression ?  ?PTA patient reports needing assistance with transfers to w/c and ADLs, reports initially walking with RW at [facility] but progressively getting weaker and unable to recently (reports Feb 2023 was home alone independent). Admitted for above and presents with problem list below, including generalized weakness, decreased activity tolerance, hypotension and impaired balance.  Pt completing transfers with min assist +2 to [facility], toileting bed level total assist and ADLs at EOB with setup to max assist.  Patient will benefit from further OT service while admitted and after dc at SNF level to optimize independence, safety and return to PLOF.  Will follow.   ? ?BP/HR as below: ?98/64 (58) HR 69 supine  ?76/50 (60) HR 73 sitting  ?61/47  ( 51) HR 86 sitting commode  ?100/46 (63) HR 70 supine  ?   ? ?Recommendations for follow up therapy are one component of a multi-disciplinary discharge planning process, led by the attending physician.  Recommendations may be updated based on patient status, additional functional criteria and insurance authorization.  ? ?Follow Up Recommendations ? Skilled nursing-short term rehab (<3 hours/day)  ?  ?Assistance Recommended at Discharge Frequent or constant Supervision/Assistance  ?Patient can return home with the following A lot of help with walking and/or transfers;A lot of help with bathing/dressing/bathroom;Assistance with cooking/housework;Assist for transportation;Help with stairs or ramp  for entrance ? ?  ?Functional Status Assessment ? Patient has had a recent decline in their functional status and demonstrates the ability to make significant improvements in function in a reasonable and predictable amount of time.  ?Equipment Recommendations ? Other (comment) (defer)  ?  ?Recommendations for Other Services   ? ? ?  ?Precautions / Restrictions Precautions ?Precautions: Fall;Other (comment) ?Precaution Comments: watch soft BP ?Restrictions ?Weight Bearing Restrictions: No  ? ?  ? ?Mobility Bed Mobility ?Overal bed mobility: Needs Assistance ?Bed Mobility: Supine to Sit, Sit to Supine ?  ?  ?Supine to sit: Mod assist ?Sit to supine: Min assist ?  ?General bed mobility comments: modA to elevate trunk and move LE to EOB ?  ? ?Transfers ?  ?  ?  ?  ?  ?  ?  ?  ?  ?  ?  ? ?  ?Balance Overall balance assessment: Needs assistance ?Sitting-balance support: Single extremity supported, Feet supported ?Sitting balance-Leahy Scale: Fair ?  ?Postural control: Posterior lean ?Standing balance support: Bilateral upper extremity supported, During functional activity ?Standing balance-Leahy Scale: Poor ?Standing balance comment: dependent on BUE support and minA of 2 ?  ?  ?  ?  ?  ?  ?  ?  ?  ?  ?  ?   ? ?ADL either performed or assessed with clinical judgement  ? ?ADL Overall ADL's : Needs assistance/impaired ?  ?  ?Grooming: Set up;Sitting ?  ?  ?  ?  ?  ?Upper Body Dressing : Set up;Sitting ?  ?Lower Body Dressing: Moderate assistance;Sitting/lateral leans;Bed level ?  ?Toilet Transfer: Minimal assistance;+2 for physical assistance;+2 for safety/equipment;Stand-pivot;BSC/3in1 ?  ?Toileting- Clothing  Manipulation and Hygiene: Total assistance;Sit to/from stand ?Toileting - Clothing Manipulation Details (indicate cue type and reason): d/t hypotensive on commode, completed in supine ?  ?  ?Functional mobility during ADLs: Minimal assistance;+2 for physical assistance;+2 for safety/equipment ?General ADL Comments:  limited by weakness, hypotension  ? ? ? ?Vision   ?Vision Assessment?: No apparent visual deficits  ?   ?Perception   ?  ?Praxis   ?  ? ?Pertinent Vitals/Pain Pain Assessment ?Pain Assessment: No/denies pain  ? ? ? ?Hand Dominance Left ?  ?Extremity/Trunk Assessment Upper Extremity Assessment ?Upper Extremity Assessment: Generalized weakness ?  ?Lower Extremity Assessment ?Lower Extremity Assessment: Defer to PT evaluation ?  ?Cervical / Trunk Assessment ?Cervical / Trunk Assessment: Kyphotic ?  ?Communication Communication ?Communication: No difficulties ?  ?Cognition Arousal/Alertness: Awake/alert ?Behavior During Therapy: Flat affect, WFL for tasks assessed/performed ?Overall Cognitive Status: Within Functional Limits for tasks assessed ?  ?  ?  ?  ?  ?  ?  ?  ?  ?  ?  ?  ?  ?  ?  ?  ?General Comments: pt agreeable and answering all questions and following cues well. flat, calm affect through session ?  ?  ?General Comments  BP: 98/64 (58) HR 69 supine; 76/50 (60) HR 73 sitting; 61/47 (51) HR 86 sitting on commode; and 100/46 (63) HR 70 supine. pt reports weakness and slight dizziness. significant brusing and skin tear noted on LUE, is covered with dressing ? ?  ?Exercises   ?  ?Shoulder Instructions    ? ? ?Home Living Family/patient expects to be discharged to:: Skilled nursing facility ?  ?  ?  ?  ?  ?  ?  ?  ?  ?  ?  ?  ?  ?  ?  ?  ?Additional Comments: pt last home in feb, was previously independent ?  ? ?  ?Prior Functioning/Environment Prior Level of Function : Needs assist (prior to hopsitalizations (feb 2023) was independent) ?  ?  ?  ?  ?  ?  ?Mobility Comments: was using a RW initally at rehab, progressing towards using WC due to weakness with assist for stand pivot transfers ?ADLs Comments: staff assisting with ADLs bed level, transfers with assist to commode ?  ? ?  ?  ?OT Problem List: Decreased strength;Decreased activity tolerance;Impaired balance (sitting and/or standing);Decreased safety  awareness;Decreased knowledge of use of DME or AE;Decreased knowledge of precautions;Cardiopulmonary status limiting activity ?  ?   ?OT Treatment/Interventions: Self-care/ADL training;Therapeutic exercise;DME and/or AE instruction;Therapeutic activities;Patient/family education;Balance training  ?  ?OT Goals(Current goals can be found in the care plan section) Acute Rehab OT Goals ?Patient Stated Goal: feel better, be able to walk ?OT Goal Formulation: With patient ?Time For Goal Achievement: 03/10/22 ?Potential to Achieve Goals: Good  ?OT Frequency: Min 2X/week ?  ? ?Co-evaluation PT/OT/SLP Co-Evaluation/Treatment: Yes ?Reason for Co-Treatment: For patient/therapist safety;To address functional/ADL transfers ?PT goals addressed during session: Mobility/safety with mobility;Balance;Proper use of DME ?OT goals addressed during session: ADL's and self-care ?  ? ?  ?AM-PAC OT "6 Clicks" Daily Activity     ?Outcome Measure Help from another person eating meals?: Total (NPO) ?Help from another person taking care of personal grooming?: A Little ?Help from another person toileting, which includes using toliet, bedpan, or urinal?: Total ?Help from another person bathing (including washing, rinsing, drying)?: A Lot ?Help from another person to put on and taking off regular upper body clothing?: A Little ?Help from another  person to put on and taking off regular lower body clothing?: A Lot ?6 Click Score: 12 ?  ?End of Session Nurse Communication: Mobility status ? ?Activity Tolerance: Patient tolerated treatment well;Treatment limited secondary to medical complications (Comment) (hypotension) ?Patient left: in bed;with call bell/phone within reach;with bed alarm set ? ?OT Visit Diagnosis: Other abnormalities of gait and mobility (R26.89);Muscle weakness (generalized) (M62.81);Dizziness and giddiness (R42);History of falling (Z91.81)  ?              ?Time: [id number] ?OT Time Calculation (min): 20 min ?Charges:  OT General  Charges ?$OT Visit: 1 Visit ?OT Evaluation ?$OT Eval Moderate Complexity: 1 Mod ? ?Jolaine Artist, OT ?Acute Rehabilitation Services ?Pager [phone] ?Office [phone] ? ? ?Delight Stare ?02/24/2022, 9:16 A

## 2022-02-24 NOTE — Anesthesia Procedure Notes (Signed)
Procedure Name: Intubation ?Date/Time: 02/24/2022 10:04 AM ?Performed by: Georgia Duff, CRNA ?Pre-anesthesia Checklist: Patient identified, Emergency Drugs available, Suction available and Patient being monitored ?Patient Re-evaluated:Patient Re-evaluated prior to induction ?Oxygen Delivery Method: Circle System Utilized ?Preoxygenation: Pre-oxygenation with 100% oxygen ?Induction Type: IV induction ?Ventilation: Mask ventilation without difficulty ?Laryngoscope Size: McGraph and 3 ?Grade View: Grade I ?Tube type: Oral ?Tube size: 7.0 mm ?Number of attempts: 1 ?Airway Equipment and Method: Stylet and Oral airway ?Placement Confirmation: ETT inserted through vocal cords under direct vision, positive ETCO2 and breath sounds checked- equal and bilateral ?Secured at: 20 cm ?Tube secured with: Tape ?Dental Injury: Teeth and Oropharynx as per pre-operative assessment  ? ? ? ? ?

## 2022-02-24 NOTE — Sedation Documentation (Signed)
Pre TIPS Pressure: 25/17 gradient of 20 ?

## 2022-02-24 NOTE — Procedures (Signed)
Vascular and Interventional Radiology Procedure Note ? ?Patient: Diane Pruitt ?DOB: [birth year] ?Medical Record Number: [id number] ?Note Date/Time: 02/24/22 2:55 PM  ? ?Performing Physician: Michaelle Birks, MD ?Assistant(s): Daryll Brod, MD ? ?Diagnosis: Cirrhosis w pHTN and PV thrombus ? ?Procedure:  ?*ABORTED* TRANSJUGULAR INTRAHEPATIC PORTOSYSTEMIC SHUNT (TIPS)  ?INTRACARDIAC ECHOCARDIOGRAPHY (ICE) ?THERAPEUTIC PARACENTESIS ? ?Anesthesia: General Anesthesia ?Complications: None ?Estimated Blood Loss: Minimal ?Specimens: None ? ?Findings:  ?- access via the RIGHT jugular and femoral veins. ?- ICE-guided portal vein access, however acute angulation with a RHV to RPV branch approach. ?- Aborted TIPS secondary to challenging portal vein anatomy ?- Elevated portosystemic gradient of 24 mmHg, consistent with portal HTN (RA 20, PV 44) ?- Therapeutic paracentesis with 3.8L of serous ascites removed. ? ?Plan: ?- will re-attempt TIPS placement this week. ? ?Final report to follow once all images are reviewed and compared with previous studies. ? ?See detailed dictation with images in PACS. ?The patient tolerated the procedure well without incident or complication and was returned to Recovery in stable condition.  ? ? ?Michaelle Birks, MD ?[facility] ?[facility] ? ? ?Pager. [phone] ?Clinic. [phone]  ?

## 2022-02-24 NOTE — Anesthesia Preprocedure Evaluation (Addendum)
Anesthesia Evaluation  ?Patient identified by MRN, date of birth, ID band ?Patient awake ? ? ? ?Reviewed: ?Allergy & Precautions, NPO status , Patient's Chart, lab work & pertinent test results ? ?Airway ?Mallampati: III ? ?TM Distance: <3 FB ?Neck ROM: Full ? ? ? Dental ? ?(+) Teeth Intact, Dental Advisory Given, Caps ?  ?Pulmonary ? ?  ?breath sounds clear to auscultation ? ? ? ? ? ? Cardiovascular ?hypertension, Pt. on medications ?+ dysrhythmias Atrial Fibrillation  ?Rhythm:Regular Rate:Normal ? ?Echo: ?1. Left ventricular ejection fraction, by estimation, is 60 to 65%. The  ?left ventricle has normal function. The left ventricle has no regional  ?wall motion abnormalities. Left ventricular diastolic parameters are  ?consistent with Grade II diastolic  ?dysfunction (pseudonormalization).  ??2. Right ventricular systolic function is normal. The right ventricular  ?size is normal. There is moderately elevated pulmonary artery systolic  ?pressure. The estimated right ventricular systolic pressure is 29.5 mmHg.  ??3. Left atrial size was severely dilated.  ??4. The mitral valve is normal in structure. Mild to moderate mitral valve  ?regurgitation. No evidence of mitral stenosis. Moderate mitral annular  ?calcification.  ??5. The aortic valve has an indeterminant number of cusps. Aortic valve  ?regurgitation is not visualized. Aortic valve sclerosis is present, with  ?no evidence of aortic valve stenosis.  ??6. The inferior vena cava is normal in size with greater than 50%  ?respiratory variability, suggesting right atrial pressure of 3 mmHg.  ?  ?Neuro/Psych ?PSYCHIATRIC DISORDERS Depression CVA   ? GI/Hepatic ?GERD  Medicated,(+) Cirrhosis  ?  ?  ? ,   ?Endo/Other  ?Hypothyroidism  ? Renal/GU ?Renal disease  ? ?  ?Musculoskeletal ? ?(+) Arthritis ,  ? Abdominal ?  ?Peds ? Hematology ?  ?Anesthesia Other Findings ? ? Reproductive/Obstetrics ? ?  ? ? ? ? ? ? ? ? ? ? ? ? ? ?  ?   ? ? ? ? ? ? ? ?Anesthesia Physical ?Anesthesia Plan ? ?ASA: 4 ? ?Anesthesia Plan: General  ? ?Post-op Pain Management:   ? ?Induction: Intravenous ? ?PONV Risk Score and Plan: 4 or greater and Ondansetron, Dexamethasone, Treatment may vary due to age or medical condition and Midazolam ? ?Airway Management Planned: Oral ETT ? ?Additional Equipment: Arterial line ? ?Intra-op Plan:  ? ?Post-operative Plan: Possible Post-op intubation/ventilation ? ?Informed Consent: I have reviewed the patients History and Physical, chart, labs and discussed the procedure including the risks, benefits and alternatives for the proposed anesthesia with the patient or authorized representative who has indicated his/her understanding and acceptance.  ? ? ? ? ? ?Plan Discussed with: CRNA ? ?Anesthesia Plan Comments: (2 Large Bore IV's, possible CVC)  ? ? ? ? ?Anesthesia Quick Evaluation ? ?

## 2022-02-24 NOTE — Evaluation (Signed)
Physical Therapy Evaluation ?Patient Details ?Name: Diane Pruitt ?MRN: [id number] ?DOB: 1951/05/03 ?Today's Date: 02/24/2022 ? ?History of Present Illness ? The pt is a 71 yo female presenitng 4/28 from [facility] for TIPS procedure on 4/29. The pt had been admitted 4/25 - 4/28 due to hypertensive gastropathy. Of note, pt also admitted 4/3-4/6 for upper GI bleed and was d/c to SNF. PMH includes: depression, afib, ischemic stroke, CKD III, and alcoholic liver cirrhosis. ?  ?Clinical Impression ? Pt in bed upon arrival of PT, agreeable to evaluation at this time. Prior to admission the pt was using a RW for therapy sessions at [facility] rehab, but had recently regressed to needing WC for mobility and significant assist from staff for ADLs. The pt now presents with limitations in functional mobility, activity tolerance, strength, and dynamic stability due to above dx, and will continue to benefit from skilled PT to address these deficits. The pt required up to modA to complete bed mobility, and minA of 2 to complete sit-stand and stand-pivot transfers to [facility], but further mobility was limited by progressively lower BP with static sitting. (See values below). The pt will continue to benefit from skilled PT acutely to progress activity tolerance and strength for OOB mobility, will need to return to SNF rehab at d/c due to lack of assist at home.  ? ?VITALS:  ?- supine in bed- BP: 98/64 (58); HR: 69bpm ?- sitting EOB - BP: 76/50 (60); HR: 73bpm ?- sitting on BSC (3 min) - BP: 61/47 (51); HR: 86bpm ?- supine at end of session - BP: 100/46 (63); HR: 70bpm ?   ? ?Recommendations for follow up therapy are one component of a multi-disciplinary discharge planning process, led by the attending physician.  Recommendations may be updated based on patient status, additional functional criteria and insurance authorization. ? ?Follow Up Recommendations Skilled nursing-short term rehab (<3 hours/day) ? ?  ?Assistance Recommended at  Discharge    ?Patient can return home with the following ? A little help with walking and/or transfers;A lot of help with walking and/or transfers;A little help with bathing/dressing/bathroom;Assistance with cooking/housework;Direct supervision/assist for medications management;Assist for transportation;Help with stairs or ramp for entrance ? ?  ?Equipment Recommendations None recommended by PT  ?Recommendations for Other Services ?    ?  ?Functional Status Assessment Patient has had a recent decline in their functional status and demonstrates the ability to make significant improvements in function in a reasonable and predictable amount of time.  ? ?  ?Precautions / Restrictions Precautions ?Precautions: Fall;Other (comment) ?Precaution Comments: watch soft BP ?Restrictions ?Weight Bearing Restrictions: No  ? ?  ? ?Mobility ? Bed Mobility ?Overal bed mobility: Needs Assistance ?Bed Mobility: Supine to Sit, Sit to Supine ?  ?  ?Supine to sit: Mod assist ?Sit to supine: Min assist ?  ?General bed mobility comments: modA to elevate trunk and move LE to EOB ?  ? ?Transfers ?Overall transfer level: Needs assistance ?Equipment used: 2 person hand held assist ?Transfers: Sit to/from Stand, Bed to chair/wheelchair/BSC ?Sit to Stand: Min assist, +2 physical assistance ?  ?Step pivot transfers: Min assist, +2 physical assistance ?  ?  ?  ?General transfer comment: pt needing minA to boost up to standing, pulling on therapists to maintain forward wt shift, still has mild posterior lean. Able to manage small lateral steps to [facility] and back to bed, further mobility deferred due to soft BP (MAP to 51) ?  ? ?Ambulation/Gait ?  ?  ?  ?  ?  ?  ?  ?  General Gait Details: deferred due to soft BP in sitting, MAP 51 ?  ? ?Balance Overall balance assessment: Needs assistance ?Sitting-balance support: Single extremity supported, Feet supported ?Sitting balance-Leahy Scale: Fair ?  ?Postural control: Posterior lean ?Standing balance support:  Bilateral upper extremity supported, During functional activity ?Standing balance-Leahy Scale: Poor ?Standing balance comment: dependent on BUE support and minA of 2 ?  ?  ?  ?  ?  ?  ?  ?  ?  ?  ?  ?   ? ? ? ?Pertinent Vitals/Pain Pain Assessment ?Pain Assessment: No/denies pain  ? ? ?Home Living Family/patient expects to be discharged to:: Skilled nursing facility ?  ?  ?  ?  ?  ?  ?  ?  ?  ?Additional Comments: pt last home in feb, was previously independent  ?  ?Prior Function Prior Level of Function : Needs assist (prior to hopsitalizations (feb 2023) was independent) ?  ?  ?  ?  ?  ?  ?Mobility Comments: was using a RW initally at rehab, progressing towards using WC due to weakness with assist for stand pivot transfers ?ADLs Comments: staff assisting with ADLs bed level, transfers with assist to commode ?  ? ? ?Hand Dominance  ? Dominant Hand: Left ? ?  ?Extremity/Trunk Assessment  ? Upper Extremity Assessment ?Upper Extremity Assessment: Defer to OT evaluation ?  ? ?Lower Extremity Assessment ?Lower Extremity Assessment: Generalized weakness ?  ? ?Cervical / Trunk Assessment ?Cervical / Trunk Assessment: Kyphotic  ?Communication  ? Communication: No difficulties  ?Cognition Arousal/Alertness: Awake/alert ?Behavior During Therapy: Flat affect, WFL for tasks assessed/performed ?Overall Cognitive Status: Within Functional Limits for tasks assessed ?  ?  ?  ?  ?  ?  ?  ?  ?  ?  ?  ?  ?  ?  ?  ?  ?General Comments: pt agreeable and answering all questions and following cues well. flat, calm affect through session ?  ?  ? ?  ?General Comments General comments (skin integrity, edema, etc.): BP: 98/64 (58) HR 69 supine; 76/50 (60) HR 73 sitting; 61/47 (51) HR 86 sitting on commode; and 100/46 (63) HR 70 supine. pt reports weakness and slight dizziness. significant brusing and skin tear noted on LUE, is covered with dressing ? ?  ?Exercises    ? ?Assessment/Plan  ?  ?PT Assessment Patient needs continued PT services   ?PT Problem List Decreased strength;Decreased activity tolerance;Decreased balance;Decreased mobility ? ?   ?  ?PT Treatment Interventions DME instruction;Gait training;Stair training;Functional mobility training;Therapeutic activities;Therapeutic exercise;Balance training;Patient/family education   ? ?PT Goals (Current goals can be found in the Care Plan section)  ?Acute Rehab PT Goals ?Patient Stated Goal: improve strength and be able to walk ?PT Goal Formulation: With patient ?Time For Goal Achievement: 03/10/22 ?Potential to Achieve Goals: Good ? ?  ?Frequency Min 2X/week ?  ? ? ?Co-evaluation PT/OT/SLP Co-Evaluation/Treatment: Yes ?Reason for Co-Treatment: For patient/therapist safety;To address functional/ADL transfers ?PT goals addressed during session: Mobility/safety with mobility;Balance;Proper use of DME ?  ?  ? ? ?  ?AM-PAC PT "6 Clicks" Mobility  ?Outcome Measure Help needed turning from your back to your side while in a flat bed without using bedrails?: A Little ?Help needed moving from lying on your back to sitting on the side of a flat bed without using bedrails?: A Lot ?Help needed moving to and from a bed to a chair (including a wheelchair)?: A Lot ?Help needed standing up from  a chair using your arms (e.g., wheelchair or bedside chair)?: A Lot ?Help needed to walk in hospital room?: Total ?Help needed climbing 3-5 steps with a railing? : Total ?6 Click Score: 11 ? ?  ?End of Session   ?Activity Tolerance: Treatment limited secondary to medical complications (Comment) (soft BP) ?Patient left: in bed;with call bell/phone within reach;with bed alarm set ?Nurse Communication: Mobility status ?PT Visit Diagnosis: Other abnormalities of gait and mobility (R26.89);Unsteadiness on feet (R26.81);Muscle weakness (generalized) (M62.81) ?  ? ?Time: [phone] ?PT Time Calculation (min) (ACUTE ONLY): 20 min ? ? ?Charges:   PT Evaluation ?$PT Eval Moderate Complexity: 1 Mod ?  ?  ?   ? ? ?West Carbo, PT, DPT   ? ?Acute Rehabilitation Department ?Pager #: [phone] ? ?Sandra Cockayne ?02/24/2022, 8:41 AM ? ?

## 2022-02-24 NOTE — Anesthesia Procedure Notes (Signed)
Arterial Line Insertion ?Start/End4/29/2023 9:20 AM, 02/24/2022 9:25 AM ?Performed by: Effie Berkshire, MD ? Patient location: Pre-op. ?Preanesthetic checklist: patient identified, IV checked, site marked, risks and benefits discussed, surgical consent, monitors and equipment checked, pre-op evaluation, timeout performed and anesthesia consent ?Lidocaine 1% used for infiltration ?Left, radial was placed ?Catheter size: 20 G ?Hand hygiene performed  and maximum sterile barriers used  ? ?Attempts: 1 ?Procedure performed without using ultrasound guided technique. ?Following insertion, dressing applied and Biopatch. ?Post procedure assessment: normal and unchanged ? ?Post procedure complications: second provider assisted. ?Patient tolerated the procedure well with no immediate complications. ? ? ?

## 2022-02-24 NOTE — Progress Notes (Signed)
Noted irreg rhythm, Dr. Sloan Leiter notified and will order cardiac monitoring. CXR ordered and K for replacement ordered.  ?

## 2022-02-24 NOTE — Progress Notes (Signed)
?PROGRESS NOTE ? ? ? ?Diane Pruitt  GDJ:[id number] DOB: 01/31/1951 DOA: 02/23/2022 ?PCP: [name], MD  ? ? ?Brief Narrative:  ?Diane Pruitt is a 71 y.o. female with medical history significant for depression, paroxysmal atrial fibrillation no longer on Eliquis, history of ischemic stroke, stage 3A CKD, alcoholic liver cirrhosis with complications of hypertensive gastropathy who was admitted to [facility] since 4/25 and transferred to [facility] for IR procedure tomorrow.   ?Patient was recently admitted to [facility] 4/3 -4/6 for upper GI bleeding, portal hypertensive gastropathy, ascites and was discharged to a skilled nursing facility near [place].  Patient tells me she felt well for a few days, however since then she felt poorly, poor appetite, weakness, dizziness and abdominal distention and diarrhea.   ?On admission to [place] she was found with AKI consistent with hepatorenal syndrome, C. difficile positive diarrhea. ?Paracentesis 4/26 ?IR consulted, noted portal vein thrombosis.  After careful discussion, decided to undergo TIPS procedure hence transferred to [facility]. ? ?Assessment & Plan: ?  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  Recurrent ascites.  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 ?Unable to tolerate diuretics, will hold off on any diuretics. ?She completed 3 days of Rocephin and discontinued as no evidence of SBP. ?Paracentesis 4/25, transudate. ?Currently without distress, no indication for repeat paracentesis and will not tolerate it. ?Patient has history of hypertensive gastropathy and recent GI bleeding, on PPI.  We will continue oral PPI. ?Bilirubin is normal.  Transaminases are mildly elevated but is stable. ?Notified [facility] to follow-up. ?Severe orthostatic hypotension.  Currently on midodrine 10 mg 3  times a day.  Continues to be orthostatic.  Will increase to 15 mg 3 times a day. ?  ?Acute kidney injury with history of chronic kidney disease stage IIIa: ?Previous baseline creatinine 1.3.  Continue to have elevated creatinine, seen by nephrology at [facility].  Received IV hydration and albumin. ?Some improvement of renal status today. ?Currently on bicarbonate infusion 75 mL/h, will continue.  Recent ultrasound without obstruction.  Intake and output monitoring.  Anticipate stabilization. ?  ?C. difficile diarrhea: Diagnosed 4/25.  Continue vancomycin 125 mg 4 times daily for 10 days.  Only 1 loose stool noted overnight. ?  ?Paroxysmal A-fib: Currently sinus rhythm.  Unable to anticoagulate.  On amiodarone 200 mg daily. ?  ?History of embolic stroke: Currently no new neurological deficit.  Aspirin and Eliquis unable to use because of recent bleeding.  Statin unable to be used due to abnormal liver functions. ?  ?Hypothyroidism: On Synthroid.  Continue. ? ? ?DVT prophylaxis: SCDs Start: 02/23/22 1736 ? ? ?Code Status: Full code ?Family Communication: None ?Disposition Plan: Status is: Inpatient ?Remains inpatient appropriate because: Inpatient procedures ?  ? ? ?Consultants:  ?Interventional radiology ?Gastroenterology ? ?Procedures:  ?Paracentesis 4/25 ?TIPS procedure 4/29 ? ?Antimicrobials:  ?None.  Completed Rocephin. ? ? ?Subjective: ?Patient seen in the morning rounds when she was going to interventional radiology. ?Patient is stating postop unit for long time, went to examine the patient. ?Patient was comfortable, denying any complaints. ?PACU nurse at the bedside tells me that they had to abort the radiology procedure but they did paracentesis.  I do not have any records yet. ?I am hoping that performing physician communicated with patient and family. ? ?Objective: ?Vitals:  ? 02/24/22 1433 02/24/22 1436 02/24/22 1453 02/24/22 1507  ?BP:   Marland Kitchen)  87/43 (!) 84/38  ?Pulse: 73 76 77 80  ?Resp: (!) 25 (!)  27 (!) 21 (!) 29  ?Temp:      ?TempSrc:      ?SpO2:  91% 90% 91%  ?Weight:      ?Height:      ? ? ?Intake/Output Summary (Last 24 hours) at 02/24/2022 1521 ?Last data filed at 02/24/2022 1404 ?Gross per 24 hour  ?Intake 3850 ml  ?Output 3725 ml  ?Net 125 ml  ? ?Filed Weights  ? 02/23/22 1818  ?Weight: 77 kg  ? ? ?Examination: ? ?General: Pale.  Debilitated and frail.  Not in distress. ?Cardiovascular: S1-S2 normal.  Regular rate rhythm.  Heart rate controlled. ?Respiratory: Bilateral clear.  On 1 to 2 L of oxygen. ?Gastrointestinal: Soft.  Nontender.  Bowel sound present. ?Ext: No edema or swelling.  No cyanosis.  No deformities. ?Neuro: Intact. ? ? ? ? ? ?Data Reviewed: I have personally reviewed following labs and imaging studies ? ?CBC: ?Recent Labs  ?Lab 02/20/22 ?1408 02/21/22 ?0342 02/22/22 ?9417 02/23/22 ?4081 02/24/22 ?0026  ?WBC 18.1* 17.9* 17.3* 17.9* 17.4*  ?NEUTROABS 15.6*  --   --   --   --   ?HGB 10.6* 9.4* 9.1* 9.1* 9.1*  ?HCT 33.8* 29.1* 28.5* 28.7* 28.0*  ?MCV 81.4 80.6 80.7 82.0 81.2  ?PLT 305 241 211 191 173  ? ?Basic Metabolic Panel: ?Recent Labs  ?Lab 02/20/22 ?1408 02/21/22 ?0342 02/22/22 ?4481 02/23/22 ?8563 02/24/22 ?0026  ?NA 129* 133* 134* 133* 132*  ?K 5.2* 5.4* 4.3 3.5 3.1*  ?CL 102 103 108 107 106  ?CO2 19* 21* 19* 17* 17*  ?GLUCOSE 124* 80 76 93 95  ?BUN 45* 44* 41* 38* 33*  ?CREATININE 2.66* 2.57* 2.35* 2.25* 2.07*  ?CALCIUM 8.5* 8.3* 8.0* 7.8* 7.9*  ? ?GFR: ?Estimated Creatinine Clearance: 24.3 mL/min (A) (by C-G formula based on SCr of 2.07 mg/dL (H)). ?Liver Function Tests: ?Recent Labs  ?Lab 02/20/22 ?1408 02/22/22 ?0336 02/23/22 ?0446 02/24/22 ?0026  ?AST 238* 172* 157* 154*  ?ALT 94* 71* 66* 63*  ?ALKPHOS 133* 106 100 101  ?BILITOT 1.5* 0.9 1.0 1.0  ?PROT 6.8 5.8* 5.9* 5.7*  ?ALBUMIN 2.5* 2.5* 2.9* 2.9*  ? ?Recent Labs  ?Lab 02/20/22 ?1408  ?LIPASE 43  ? ?No results for input(s): AMMONIA in the last 168 hours. ?Coagulation Profile: ?Recent Labs  ?Lab 02/20/22 ?1408 02/23/22 ?1497  02/24/22 ?0026  ?INR 1.3* 1.5* 1.5*  ? ?Cardiac Enzymes: ?No results for input(s): CKTOTAL, CKMB, CKMBINDEX, TROPONINI in the last 168 hours. ?BNP (last 3 results) ?No results for input(s): PROBNP in the last 8760 hours. ?HbA1C: ?No results for input(s): HGBA1C in the last 72 hours. ?CBG: ?No results for input(s): GLUCAP in the last 168 hours. ?Lipid Profile: ?No results for input(s): CHOL, HDL, LDLCALC, TRIG, CHOLHDL, LDLDIRECT in the last 72 hours. ?Thyroid Function Tests: ?No results for input(s): TSH, T4TOTAL, FREET4, T3FREE, THYROIDAB in the last 72 hours. ?Anemia Panel: ?No results for input(s): VITAMINB12, FOLATE, FERRITIN, TIBC, IRON, RETICCTPCT in the last 72 hours. ?Sepsis Labs: ?No results for input(s): PROCALCITON, LATICACIDVEN in the last 168 hours. ? ?Recent Results (from the past 240 hour(s))  ?Resp Panel by RT-PCR (Flu A&B, Covid) Nasopharyngeal Swab     Status: None  ? Collection Time: 02/20/22  3:03 PM  ? Specimen: Nasopharyngeal Swab; Nasopharyngeal(NP) swabs in vial transport medium  ?Result Value Ref Range Status  ? SARS Coronavirus 2 by RT PCR NEGATIVE NEGATIVE Final  ?  Comment: (NOTE) ?SARS-CoV-2 target nucleic acids are NOT DETECTED. ? ?The SARS-CoV-2 RNA is generally detectable in upper respiratory ?specimens during the acute phase of infection. The lowest ?concentration of SARS-CoV-2 viral copies this assay can detect is ?138 copies/mL. A negative result does not preclude SARS-Cov-2 ?infection and should not be used as the sole basis for treatment or ?other patient management decisions. A negative result may occur with  ?improper specimen collection/handling, submission of specimen other ?than nasopharyngeal swab, presence of viral mutation(s) within the ?areas targeted by this assay, and inadequate number of viral ?copies(<138 copies/mL). A negative result must be combined with ?clinical observations, patient history, and epidemiological ?information. The expected result is  Negative. ? ?Fact Sheet for Patients:  ?EntrepreneurPulse.com.au ? ?Fact Sheet for Healthcare Providers:  ?IncredibleEmployment.be ? ?This test is no t yet approved or cleared by the

## 2022-02-24 NOTE — Anesthesia Postprocedure Evaluation (Signed)
Anesthesia Post Note ? ?Patient: [name] ? ?Procedure(s) Performed: TIPS ? ?  ? ?Patient location during evaluation: PACU ?Anesthesia Type: General ?Level of consciousness: awake and alert ?Pain management: pain level controlled ?Vital Signs Assessment: post-procedure vital signs reviewed and stable ?Respiratory status: spontaneous breathing, nonlabored ventilation, respiratory function stable and patient connected to nasal cannula oxygen ?Cardiovascular status: blood pressure returned to baseline and stable ?Postop Assessment: no apparent nausea or vomiting ?Anesthetic complications: no ? ? ?No notable events documented. ? ?Last Vitals:  ?Vitals:  ? 02/24/22 1537 02/24/22 1558  ?BP: (!) 90/43 (!) 92/46  ?Pulse: 79 80  ?Resp: (!) 22 (!) 52  ?Temp:    ?SpO2: 91% 91%  ?  ?Last Pain:  ?Vitals:  ? 02/24/22 1537  ?TempSrc:   ?PainSc: 0-No pain  ? ? ?  ?  ?  ?  ?  ?  ? ?Effie Berkshire ? ? ? ? ?

## 2022-02-24 NOTE — Progress Notes (Signed)
Patient NPO past MN ?

## 2022-02-25 DIAGNOSIS — I951 Orthostatic hypotension: Secondary | ICD-10-CM | POA: Diagnosis not present

## 2022-02-25 DIAGNOSIS — I480 Paroxysmal atrial fibrillation: Secondary | ICD-10-CM | POA: Diagnosis not present

## 2022-02-25 DIAGNOSIS — K7031 Alcoholic cirrhosis of liver with ascites: Secondary | ICD-10-CM | POA: Diagnosis not present

## 2022-02-25 DIAGNOSIS — E039 Hypothyroidism, unspecified: Secondary | ICD-10-CM | POA: Diagnosis not present

## 2022-02-25 DIAGNOSIS — A498 Other bacterial infections of unspecified site: Secondary | ICD-10-CM | POA: Diagnosis not present

## 2022-02-25 DIAGNOSIS — Z66 Do not resuscitate: Secondary | ICD-10-CM

## 2022-02-25 DIAGNOSIS — Z7189 Other specified counseling: Secondary | ICD-10-CM

## 2022-02-25 DIAGNOSIS — D62 Acute posthemorrhagic anemia: Secondary | ICD-10-CM | POA: Diagnosis not present

## 2022-02-25 DIAGNOSIS — Z515 Encounter for palliative care: Secondary | ICD-10-CM

## 2022-02-25 LAB — CBC WITH DIFFERENTIAL/PLATELET
Abs Immature Granulocytes: 0.19 10*3/uL — ABNORMAL HIGH (ref 0.00–0.07)
Basophils Absolute: 0 10*3/uL (ref 0.0–0.1)
Basophils Relative: 0 %
Eosinophils Absolute: 0.1 10*3/uL (ref 0.0–0.5)
Eosinophils Relative: 1 %
HCT: 27.5 % — ABNORMAL LOW (ref 36.0–46.0)
Hemoglobin: 9 g/dL — ABNORMAL LOW (ref 12.0–15.0)
Immature Granulocytes: 1 %
Lymphocytes Relative: 6 %
Lymphs Abs: 0.9 10*3/uL (ref 0.7–4.0)
MCH: 26.6 pg (ref 26.0–34.0)
MCHC: 32.7 g/dL (ref 30.0–36.0)
MCV: 81.4 fL (ref 80.0–100.0)
Monocytes Absolute: 1.1 10*3/uL — ABNORMAL HIGH (ref 0.1–1.0)
Monocytes Relative: 7 %
Neutro Abs: 13.5 10*3/uL — ABNORMAL HIGH (ref 1.7–7.7)
Neutrophils Relative %: 85 %
Platelets: 134 10*3/uL — ABNORMAL LOW (ref 150–400)
RBC: 3.38 MIL/uL — ABNORMAL LOW (ref 3.87–5.11)
RDW: 21.2 % — ABNORMAL HIGH (ref 11.5–15.5)
Smear Review: ADEQUATE
WBC: 15.8 10*3/uL — ABNORMAL HIGH (ref 4.0–10.5)
nRBC: 0 % (ref 0.0–0.2)

## 2022-02-25 LAB — COMPREHENSIVE METABOLIC PANEL
ALT: 70 U/L — ABNORMAL HIGH (ref 0–44)
AST: 219 U/L — ABNORMAL HIGH (ref 15–41)
Albumin: 3 g/dL — ABNORMAL LOW (ref 3.5–5.0)
Alkaline Phosphatase: 98 U/L (ref 38–126)
Anion gap: 10 (ref 5–15)
BUN: 32 mg/dL — ABNORMAL HIGH (ref 8–23)
CO2: 18 mmol/L — ABNORMAL LOW (ref 22–32)
Calcium: 7.9 mg/dL — ABNORMAL LOW (ref 8.9–10.3)
Chloride: 105 mmol/L (ref 98–111)
Creatinine, Ser: 2.05 mg/dL — ABNORMAL HIGH (ref 0.44–1.00)
GFR, Estimated: 26 mL/min — ABNORMAL LOW (ref >60)
Glucose, Bld: 86 mg/dL (ref 70–99)
Potassium: 3.9 mmol/L (ref 3.5–5.1)
Sodium: 133 mmol/L — ABNORMAL LOW (ref 135–145)
Total Bilirubin: 1.5 mg/dL — ABNORMAL HIGH (ref 0.3–1.2)
Total Protein: 5.4 g/dL — ABNORMAL LOW (ref 6.5–8.1)

## 2022-02-25 LAB — MAGNESIUM: Magnesium: 1.5 mg/dL — ABNORMAL LOW (ref 1.7–2.4)

## 2022-02-25 LAB — BODY FLUID CULTURE W GRAM STAIN
Culture: NO GROWTH
Gram Stain: NONE SEEN

## 2022-02-25 MED ORDER — MAGNESIUM SULFATE 2 GM/50ML IV SOLN
2.0000 g | Freq: Once | INTRAVENOUS | Status: AC
  Administered 2022-02-25: 2 g via INTRAVENOUS
  Filled 2022-02-25: qty 50

## 2022-02-25 NOTE — Plan of Care (Signed)
  Problem: Nutrition: Goal: Adequate nutrition will be maintained Outcome: Progressing   Problem: Pain Managment: Goal: General experience of comfort will improve Outcome: Progressing   Problem: Safety: Goal: Ability to remain free from injury will improve Outcome: Progressing   

## 2022-02-25 NOTE — Consult Note (Signed)
? ?Palliative Care Consult Note  ?                                ?Date: 02/25/2022  ? ?Patient Name: Diane Pruitt  ?DOB: January 30, 1951  MRN: [id number]  Age / Sex: 71 y.o., female  ?PCP: [name], MD ?Referring Physician: Barb Merino, MD ? ?Reason for Consultation: Establishing goals of care ? ?HPI/Patient Profile: Palliative Care consult requested for goals of care discussion in this 71 y.o. female  with past medical history of atrial fibrillation (Eliquis), CKD 3, depression, ischemic stroke, alcoholic cirrhosis, and hypertension. She was admitted initially on 4/25 at [facility] due to weakness and recurrent ascites. Transferred to [facility] on 02/23/2022 for IR TIPS procedure. Recent hospitalization where she was treated for upper GI bleed and discharged on 02/01/22 to SNF.  ? ?Past Medical History:  ?Diagnosis Date  ? Atrial fibrillation ([place])   ? Barrett esophagus   ? Chronic kidney disease (CKD), stage III (moderate) (HCC)   ? Cirrhosis ([place])   ? Dysrhythmia   ? Atrial Fibrillation 2 years ago  ? Hypertension   ? Ischemic stroke ([place]) 01/25/2018  ? Stroke [facility])   ? ? ? ?Subjective:  ? ?This NP Osborne Oman reviewed medical records, received report from team, assessed the patient and then met at the patient's bedside with Diane Pruitt to discuss diagnosis, prognosis, GOC, EOL wishes disposition and options. ? ?Awake and alert resting in bed. Easily awakened. No family at the bedside. Denies pain. Just finishing up lunch.  ?  ?Concept of Palliative Care was introduced as specialized medical care for people and their families living with serious illness.  It focuses on providing relief from the symptoms and stress of a serious illness.  The goal is to improve quality of life for both the patient and the family. Values and goals of care important to patient and family were attempted to be elicited. ? ?I created space and opportunity for patient and family to explore  state of health prior to admission, thoughts, and feelings.  ? ?Ms. Goedecke lived by herself prior to recent illness. She has 4 children who live in different areas ([place], [place], [place], and [place]). 7 grandchildren. 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  ? ?Prior to SNF rehab and recent hospitalizations she was able to perform all ADLs independently. Does endorse requiring walker to assist in ambulation due to gait instability. Noticeable increase in weakness and decreased appetite.  ? ?We discussed Her current illness and what it means in the larger context of Her on-going co-morbidities. Natural disease trajectory and expectations were discussed. ? ?Diane Pruitt verbalized understanding of current illness and co-morbidities. She is emotional expressing her hopes for some improvement/stability. Hopeful TIPS procedure will offer her with the ability to continue thrive.  ? ?I discussed the importance of continued conversation with family and their medical providers regarding overall plan of care and treatment options, ensuring decisions are within the context of the patients values and GOCs. ? ?Questions and concerns were addressed.  Patient was encouraged to call with questions or concerns.  PMT will continue to support holistically as needed. ? ?Objective:  ? ?Primary Diagnoses: ?Present on Admission: ? 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 Acute blood loss anemia ? Orthostatic hypotension with weakness and falls  ? AKI (acute kidney injury) ([place]) ? Acquired hypothyroidism ? Depression ? Paroxysmal atrial fibrillation (  HCC) ? Clostridium difficile infection ? ? ?Scheduled Meds: ? allopurinol  200 mg Oral Daily  ? amiodarone  200 mg Oral Daily  ? Chlorhexidine Gluconate Cloth  6 each Topical Daily  ? levothyroxine  200 mcg Oral q morning  ? midodrine  15 mg Oral TID WC  ? multivitamin with minerals  1 tablet Oral Daily  ? pantoprazole  40 mg Oral BID  ? potassium chloride  40 mEq  Oral BID  ? sertraline  50 mg Oral Daily  ? vancomycin  125 mg Oral QID  ? ? ?Continuous Infusions: ? ? ?PRN Meds: ?acetaminophen **OR** acetaminophen, ondansetron **OR** ondansetron (ZOFRAN) IV ? ?Allergies  ?Allergen Reactions  ? Colchicine Other (See Comments)  ?  Stomach upset, diarrhea ?Other reaction(s): stomach upset  ? ? ?Review of Systems  ?Constitutional:  Positive for fatigue.  ?Gastrointestinal:  Positive for abdominal pain.  ?Neurological:  Positive for weakness.  ?Unless otherwise noted, a complete review of systems is negative. ? ?Physical Exam ?General: NAD, frail chronically-ill appearing ?Cardiovascular: regular rate and rhythm ?Pulmonary: diminished bilaterally  ?Abdomen: soft, nontender, + bowel sounds ?Extremities: no edema, no joint deformities ?Skin: no rashes, warm and dry ?Neurological: AAO x4 ? ?Vital Signs:  ?BP (!) 93/46 (BP Location: Left Arm)   Pulse 63   Temp 98.5 ?F (36.9 ?C) (Oral)   Resp 17   Ht _0  (1.575 m)   Wt 77 kg   SpO2 98%   BMI 31.05 kg/m?  ?Pain Scale: 0-10 ?  ?Pain Score: Asleep ? ?SpO2: SpO2: 98 % ?O2 Device:SpO2: 98 % ?O2 Flow Rate: .O2 Flow Rate (L/min): 4 L/min ? ?IO: Intake/output summary:  ?Intake/Output Summary (Last 24 hours) at 02/25/2022 1252 ?Last data filed at 02/25/2022 3267 ?Gross per 24 hour  ?Intake 1750 ml  ?Output 4125 ml  ?Net -2375 ml  ? ? ?LBM: Last BM Date : 02/22/22 ?Baseline Weight: Weight: 77 kg ?Most recent weight: Weight: 77 kg     ? ?Palliative Assessment/Data:  ? ? ?Advanced Care Planning:  ? ?Primary Decision Maker: ?PATIENT ? ?Code Status/Advance Care Planning: ?DNR ? ?A discussion was had today regarding advanced directives. Concepts specific to code status, artifical feeding and hydration, continued IV antibiotics and rehospitalization was had.   ? ?Diane Pruitt shares she does have an advance directive. Her daughter Diane Pruitt is her primary decision maker and if she is not available her son, Diane Pruitt would be additional support.  ? ?We discussed  her current full code status with consideration of her current illness and co-morbidities. Diane Pruitt states she does not wish for any heroic measures to be taken. Her desire is for a natural death when that time comes. Also would not want any artificial feedings or hydration to prolong her life. She states this is documented and her children are aware of her wishes.  ? ? ?Assessment & Plan:  ? ?SUMMARY OF RECOMMENDATIONS   ?DNR/DNI-as requested and confirmed by patient.  ?No advanced directives on file however Diane Pruitt states she does have one. Her daughter Diane Pruitt and son Diane Pruitt are her main medical decision makers. No artificial feedings. Desires natural death.  ?Clear in expressed wishes to continue to treat the treatable. She is remaining hopeful for some improvement and stability. Realistic in her understanding of ongoing health challenges and long-term prognosis. States she is open to further SNF rehab however would like to be consider for a different location if possible.  ?Continue with current plan of care per medical team.  ?  Ongoing goals of care discussions to include daughter. Recommend outpatient palliative follow-up at minimum.  ?PMT will continue to support and follow. Please secure chat with urgent needs. ? ?Palliative Prophylaxis:  ?Bowel Regimen and Frequent Pain Assessment ? ?Additional Recommendations (Limitations, Scope, Preferences): ?No Artificial Feeding and dnr/dni, treat the treatable  ? ?Psycho-social/Spiritual:  ?Desire for further Chaplaincy support: no ?Additional Recommendations:  Ongoing goals of care discussions  ? ?Prognosis:  ?Guarded-Poor  ? ?Discharge Planning:  ?To Be Determined with recommended outpatient Palliative support at minimum  ? ? ?Patient expressed understanding and was in agreement with this plan.  ? ?Time Total: 50 min.  ? ?Visit consisted of counseling and education dealing with the complex and emotionally intense issues of symptom management and palliative care in the  setting of serious and potentially life-threatening illness.Greater than 50%  of this time was spent counseling and coordinating care related to the above assessment and plan. ? ?Signed by: ? ?[employer]

## 2022-02-25 NOTE — Progress Notes (Signed)
PROGRESS NOTE    Diane Pruitt  XBJ:[id number] DOB: 1951-02-24 DOA: 02/23/2022 PCP: Sigmund Hazel, MD    Brief Narrative:  Diane Pruitt is a 71 y.o. 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   Patient was recently admitted to [facility] 4/3 -4/6 for upper GI bleeding, portal hypertensive gastropathy, ascites and was discharged to a skilled nursing facility near [facility].  Patient tells me she felt well for a few days, however since then she felt poorly, poor appetite, weakness, dizziness and abdominal distention and diarrhea.   On admission to  she was found with AKI consistent with hepatorenal syndrome, C. difficile positive diarrhea. Paracentesis 4/26 IR consulted, noted portal vein thrombosis.  After careful discussion, decided to undergo TIPS procedure hence transferred to [facility].  Assessment & Plan:   Cirrhosis of liver with portal hypertensive gastropathy, cirrhosis of liver suspected secondary to alcoholic liver disease.  Recurrent ascites.  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 Unable to tolerate diuretics, will hold off on any diuretics.  1 dose of Lasix given 4/29 night. She completed 3 days of Rocephin and discontinued as no evidence of SBP. Paracentesis 4/25, transudate. Currently without distress, no indication for repeat paracentesis and will not tolerate it. Patient has history of hypertensive gastropathy and recent GI bleeding, on PPI.  We will continue oral PPI. Bilirubin is normal.  Transaminases are mildly elevated but is  stable. GI will follow-up. Severe orthostatic hypotension.  Increase dose of midodrine 50 mg 3 times a day.  Acute kidney injury with history of chronic kidney disease stage IIIa: Previous baseline creatinine 1.3.  Continue to have elevated creatinine, seen by nephrology at [facility].  Treated with bicarbonate infusion.  Creatinine 2.05 which is at about her baseline.  Due to fluid overload, discontinued further isotonic fluid.  Renal ultrasound without obstruction.  Continue intake and output monitoring.  C. difficile diarrhea: Diagnosed 4/25.  Continue vancomycin 125 mg 4 times daily for 10 days.  No more loose stools.   Paroxysmal A-fib: Patient is sinus rhythm today.  Unable to anticoagulate due to ongoing bleeding.  Statin unable to be used due to abnormal liver functions.   Hypothyroidism: On Synthroid.  Continue.  Hypomagnesemia: Replace aggressively today.  Recheck levels.  Work with PT and OT.  Referred to a skilled nursing facility.  Discussed with radiology at the bedside, they would postpone procedure for at least 2 weeks for patient to recover into rehab. Discontinue Foley catheter.   DVT prophylaxis: SCDs Start: 02/23/22 1736   Code Status: Full code Family Communication: Daughter on the phone. Disposition Plan: Status is: Inpatient Remains inpatient appropriate because: Inpatient procedures     Consultants:  Interventional radiology Gastroenterology  Procedures:  Paracentesis 4/25 Paracentesis 4/29, 3.7 L removed.  Antimicrobials:  None.  Completed Rocephin.   Subjective:  Patient seen and examined.  No family at the bedside.  Patient herself tells me she has no issues.  Denies any nausea vomiting.  Appetite is poor.  Not mobilized today.  Expecting PT OT to work with her today.  Does not want to go back to same nursing home for rehab. Discussed with patient  about challenging conditions.  Discussed with patient's daughter about ongoing issues, rehab  and difficult to treat conditions.  They were agreeable to meet with palliative care team for discussion.  Objective: Vitals:   02/24/22 2035 02/24/22 2140 02/25/22 0552 02/25/22 0818  BP: (!) 83/50 (!) 81/45 (!) 88/52 (!) 93/46  Pulse: 70 67 65 63  Resp:  18 16 17   Temp:  97.9 F (36.6 C) 98.2 F (36.8 C) 98.5 F (36.9 C)  TempSrc:  Oral Oral Oral  SpO2: 95% 95% 99% 98%  Weight:      Height:        Intake/Output Summary (Last 24 hours) at 02/25/2022 1350 Last data filed at 02/25/2022 0552 Gross per 24 hour  Intake 1250 ml  Output 4125 ml  Net -2875 ml   Filed Weights   02/23/22 1818  Weight: 77 kg    Examination:  General: Pale.  Debilitated and frail.  Not in distress.  Pale looking. Cardiovascular: S1-S2 normal.  Regular rate rhythm.  Heart rate controlled. Respiratory: Bilateral clear.  On 4 L oxygen. Gastrointestinal: Soft.  Nontender.  Bowel sound present.  Foley catheter with clear urine. Ext: No edema or swelling.  No cyanosis.  No deformities. Neuro: Intact.      Data Reviewed: I have personally reviewed following labs and imaging studies  CBC: Recent Labs  Lab 02/20/22 1408 02/21/22 0342 02/22/22 0336 02/23/22 0446 02/24/22 0026 02/24/22 1325 02/24/22 1537 02/25/22 0854  WBC 18.1* 17.9* 17.3* 17.9* 17.4*  --   --  15.8*  NEUTROABS 15.6*  --   --   --   --   --   --  13.5*  HGB 10.6* 9.4* 9.1* 9.1* 9.1* 10.5* 9.2* 9.0*  HCT 33.8* 29.1* 28.5* 28.7* 28.0* 31.0* 27.0* 27.5*  MCV 81.4 80.6 80.7 82.0 81.2  --   --  81.4  PLT 305 241 211 191 173  --   --  134*   Basic Metabolic Panel: Recent Labs  Lab 02/21/22 0342 02/22/22 0336 02/23/22 0446 02/24/22 0026 02/24/22 1325 02/24/22 1537 02/25/22 0854  NA 133* 134* 133* 132* 136 136 133*  K 5.4* 4.3 3.5 3.1* 3.0* 2.9* 3.9  CL 103 108 107 106  --   --  105  CO2 21* 19* 17* 17*  --   --  18*  GLUCOSE 80 76 93 95  --   --  86  BUN 44* 41* 38* 33*  --   --  32*  CREATININE 2.57* 2.35* 2.25*  2.07*  --   --  2.05*  CALCIUM 8.3* 8.0* 7.8* 7.9*  --   --  7.9*  MG  --   --   --   --   --   --  1.5*   GFR: Estimated Creatinine Clearance: 24.5 mL/min (A) (by C-G formula based on SCr of 2.05 mg/dL (H)). Liver Function Tests: Recent Labs  Lab 02/20/22 1408 02/22/22 0336 02/23/22 0446 02/24/22 0026 02/25/22 0854  AST 238* 172* 157* 154* 219*  ALT 94* 71* 66* 63* 70*  ALKPHOS 133* 106 100 101 98  BILITOT 1.5* 0.9 1.0 1.0 1.5*  PROT 6.8 5.8* 5.9* 5.7* 5.4*  ALBUMIN 2.5* 2.5* 2.9* 2.9* 3.0*   Recent Labs  Lab 02/20/22 1408  LIPASE 43   No results for input(s): AMMONIA in the last 168 hours. Coagulation Profile: Recent Labs  Lab 02/20/22 1408 02/23/22 0855 02/24/22 0026  INR 1.3* 1.5* 1.5*   Cardiac Enzymes: No results  for input(s): CKTOTAL, CKMB, CKMBINDEX, TROPONINI in the last 168 hours. BNP (last 3 results) No results for input(s): PROBNP in the last 8760 hours. HbA1C: No results for input(s): HGBA1C in the last 72 hours. CBG: No results for input(s): GLUCAP in the last 168 hours. Lipid Profile: No results for input(s): CHOL, HDL, LDLCALC, TRIG, CHOLHDL, LDLDIRECT in the last 72 hours. Thyroid Function Tests: No results for input(s): TSH, T4TOTAL, FREET4, T3FREE, THYROIDAB in the last 72 hours. Anemia Panel: No results for input(s): VITAMINB12, FOLATE, FERRITIN, TIBC, IRON, RETICCTPCT in the last 72 hours. Sepsis Labs: No results for input(s): PROCALCITON, LATICACIDVEN in the last 168 hours.  Recent Results (from the past 240 hour(s))  Resp Panel by RT-PCR (Flu A&B, Covid) Nasopharyngeal Swab     Status: None   Collection Time: 02/20/22  3:03 PM   Specimen: Nasopharyngeal Swab; Nasopharyngeal(NP) swabs in vial transport medium  Result Value Ref Range Status   SARS Coronavirus 2 by RT PCR NEGATIVE NEGATIVE Final    Comment: (NOTE) SARS-CoV-2 target nucleic acids are NOT DETECTED.  The SARS-CoV-2 RNA is generally detectable in upper  respiratory specimens during the acute phase of infection. The lowest concentration of SARS-CoV-2 viral copies this assay can detect is 138 copies/mL. A negative result does not preclude SARS-Cov-2 infection and should not be used as the sole basis for treatment or other patient management decisions. A negative result may occur with  improper specimen collection/handling, submission of specimen other than nasopharyngeal swab, presence of viral mutation(s) within the areas targeted by this assay, and inadequate number of viral copies(<138 copies/mL). A negative result must be combined with clinical observations, patient history, and epidemiological information. The expected result is Negative.  Fact Sheet for Patients:  BloggerCourse.com  Fact Sheet for Healthcare Providers:  SeriousBroker.it  This test is no t yet approved or cleared by the [place] FDA and  has been authorized for detection and/or diagnosis of SARS-CoV-2 by FDA under an Emergency Use Authorization (EUA). This EUA will remain  in effect (meaning this test can be used) for the duration of the COVID-19 declaration under Section 564(b)(1) of the Act, 21 U.S.C.section 360bbb-3(b)(1), unless the authorization is terminated  or revoked sooner.       Influenza A by PCR NEGATIVE NEGATIVE Final   Influenza B by PCR NEGATIVE NEGATIVE Final    Comment: (NOTE) The Xpert Xpress SARS-CoV-2/FLU/RSV plus assay is intended as an aid in the diagnosis of influenza from Nasopharyngeal swab specimens and should not be used as a sole basis for treatment. Nasal washings and aspirates are unacceptable for Xpert Xpress SARS-CoV-2/FLU/RSV testing.  Fact Sheet for Patients: BloggerCourse.com  Fact Sheet for Healthcare Providers: SeriousBroker.it  This test is not yet approved or cleared by the [place] FDA and has been  authorized for detection and/or diagnosis of SARS-CoV-2 by FDA under an Emergency Use Authorization (EUA). This EUA will remain in effect (meaning this test can be used) for the duration of the COVID-19 declaration under Section 564(b)(1) of the Act, 21 U.S.C. section 360bbb-3(b)(1), unless the authorization is terminated or revoked.  Performed at [facility], [address] [place], [place] [address]   MRSA Next Gen by PCR, Nasal     Status: None   Collection Time: 02/21/22 12:44 AM   Specimen: Nasal Mucosa; Nasal Swab  Result Value Ref Range Status   MRSA by PCR Next Gen NOT DETECTED NOT DETECTED Final    Comment: (NOTE) The GeneXpert MRSA Assay (FDA  approved for NASAL specimens only), is one component of a comprehensive MRSA colonization surveillance program. It is not intended to diagnose MRSA infection nor to guide or monitor treatment for MRSA infections. Test performance is not FDA approved in patients less than 50 years old. Performed at [facility], [address] [place], [place] [address]   Gastrointestinal Panel by PCR , Stool     Status: Abnormal   Collection Time: 02/21/22 12:44 AM   Specimen: Stool  Result Value Ref Range Status   Campylobacter species NOT DETECTED NOT DETECTED Final   Plesimonas shigelloides NOT DETECTED NOT DETECTED Final   Salmonella species NOT DETECTED NOT DETECTED Final   Yersinia enterocolitica NOT DETECTED NOT DETECTED Final   Vibrio species NOT DETECTED NOT DETECTED Final   Vibrio cholerae NOT DETECTED NOT DETECTED Final   Enteroaggregative E coli (EAEC) NOT DETECTED NOT DETECTED Final   Enteropathogenic E coli (EPEC) NOT DETECTED NOT DETECTED Final   Enterotoxigenic E coli (ETEC) NOT DETECTED NOT DETECTED Final   Shiga like toxin producing E coli (STEC) NOT DETECTED NOT DETECTED Final   Shigella/Enteroinvasive E coli (EIEC) NOT DETECTED NOT DETECTED Final   Cryptosporidium NOT DETECTED NOT DETECTED Final    Cyclospora cayetanensis NOT DETECTED NOT DETECTED Final   Entamoeba histolytica NOT DETECTED NOT DETECTED Final   Giardia lamblia NOT DETECTED NOT DETECTED Final   Adenovirus F40/41 NOT DETECTED NOT DETECTED Final   Astrovirus NOT DETECTED NOT DETECTED Final   Norovirus GI/GII NOT DETECTED NOT DETECTED Final   Rotavirus A NOT DETECTED NOT DETECTED Final   Sapovirus (I, II, IV, and V) DETECTED (A) NOT DETECTED Final    Comment: Performed at [facility], [address] [place], [place] [address]  C Difficile Quick Screen w PCR reflex     Status: Abnormal   Collection Time: 02/21/22 12:44 AM   Specimen: STOOL  Result Value Ref Range Status   C Diff antigen POSITIVE (A) NEGATIVE Final   C Diff toxin POSITIVE (A) NEGATIVE Final   C Diff interpretation Toxin producing C. difficile detected.  Final    Comment: CRITICAL RESULT CALLED TO, READ BACK BY AND VERIFIED WITH: MARIA BROWN @ 0154 ON 02/21/2022.Marland KitchenMarland KitchenTKR Performed at [facility], [address] [place], [place] [address]   Body fluid culture w Gram Stain     Status: None   Collection Time: 02/21/22  3:49 PM   Specimen: PATH Cytology Peritoneal fluid  Result Value Ref Range Status   Specimen Description   Final    PERITONEAL Performed at [facility], [address], [place], [place] [address]    Special Requests   Final    NONE Performed at [facility], [address] [place], [place] [address]    Gram Stain NO WBC SEEN NO ORGANISMS SEEN   Final   Culture   Final    NO GROWTH 3 DAYS Performed at [facility], [address] [place], [place] [address]    Report Status 02/25/2022 FINAL  Final  Urine Culture     Status: Abnormal   Collection Time: 02/22/22 12:48 AM   Specimen: Urine, Random  Result Value Ref Range Status   Specimen Description   Final    URINE, RANDOM Performed at [facility], [address], [place], [place] [address]    Special Requests   Final     Immunocompromised Performed at [facility], [address] [place], [place] [address]  Culture >=100,000 COLONIES/mL ENTEROCOCCUS FAECALIS (A)  Final   Report Status 02/24/2022 FINAL  Final   Organism ID, Bacteria ENTEROCOCCUS FAECALIS (A)  Final      Susceptibility   Enterococcus faecalis - MIC*    AMPICILLIN <=2 SENSITIVE Sensitive     NITROFURANTOIN <=16 SENSITIVE Sensitive     VANCOMYCIN 1 SENSITIVE Sensitive     * >=100,000 COLONIES/mL ENTEROCOCCUS FAECALIS         Radiology Studies: DG Chest 1 View  Result Date: 02/24/2022 CLINICAL DATA:  Shortness of breath EXAM: CHEST  1 VIEW COMPARISON:  Previous studies including the examination of 02/20/2022 FINDINGS: Transverse diameter of heart is increased. Dense calcification is seen in the mitral annulus. Central pulmonary vessels are prominent. Increased interstitial markings are seen in the parahilar regions. There are small linear densities in the right mid and both lower lung fields. Costophrenic angles are clear. There is no pneumothorax. IMPRESSION: Cardiomegaly. Central pulmonary vessels are more prominent suggesting CHF. There is increase in interstitial markings in the parahilar regions, possibly suggesting interstitial edema. There are linear densities in the parahilar regions and lower lung fields suggesting subsegmental atelectasis. Electronically Signed   By: Ernie Avena M.D.   On: 02/24/2022 17:23   IR Tips  Result Date: 02/25/2022 CLINICAL DATA:  Briefly, 71 year old female with decompensated EtOH cirrhosis and portal hypertension. Liver Doppler with PV/SMV confluence thrombus. MELD = 18.  Child Pugh class B, 8 points. EXAM: Procedures: 1. ABORTED TRANSJUGULAR INTRAHEPATIC PORTOSYSTEMIC SHUNT (TIPS) 2. CENTRAL AND PORTAL VENOUS MANOMETRY 3. PORTAL VENOGRAM 4. INTRACARDIAC ECHOCARDIOGRAPHY (ICE) 5. THERAPEUTIC PARACENTESIS Performing Physician: Roanna Banning, MD Assistant(s): Ruel Favors, MD A qualified  trainee/resident or advanced practice provider (APP) was not immediately available to assist with this case. MEDICATIONS: As antibiotic prophylaxis, Mefoxin 2 g IV was ordered pre-procedure and administered intravenously within one hour of incision. ANESTHESIA/SEDATION: Sedation by the Anesthesia Team was performed. Please see anesthesiology log for details. CONTRAST:  100 mL Visipaque 320, intravenous FLUOROSCOPY TIME:  Fluoroscopic dose; 368 mGy COMPLICATIONS: None immediate. PROCEDURE: Informed written consent was obtained from the patient and/or patient's representative after a thorough discussion of the procedural risks, benefits and alternatives. All questions were addressed. Maximal sterile barrier technique was utilized including caps, mask, sterile gowns, sterile gloves, sterile drape, hand hygiene and skin antiseptic. A timeout was performed prior to the initiation of the procedure. Initial ultrasound scanning demonstrates a large amount of ascites within the RIGHT abdomen. Under direct ultrasound guidance, an 8 Fr Safe-T-Centesis catheter was introduced. An ultrasound image was saved for documentation purposes. Therapeutic paracentesis was performed. A preliminary ultrasound of the RIGHT neck was performed and demonstrates a patent internal jugular vein. A permanent ultrasound image was recorded. Using a combination of fluoroscopy and ultrasound, an access site was determined. A small dermatotomy was made at the planned puncture site. Using ultrasound guidance, access into the RIGHT internal jugular vein was obtained with visualization of needle entry into the vessel using a standard micropuncture technique. A wire was advanced into the IVC and serial fascial dilation performed. A 10 French TIPS sheath was placed into the internal jugular vein and advanced to the IVC. A preliminary ultrasound of the RIGHT groin was performed and demonstrates a patent right common femoral vein. A permanent ultrasound image  was recorded. Using a combination of fluoroscopy and ultrasound, an access site was determined. A small dermatotomy was made at the planned puncture site. Using ultrasound guidance, access into the RIGHT common femoral vein was  obtained with visualization of needle entry into the vessel using a standard micropuncture technique. A wire was advanced into the IVC insert all fascial dilation performed. An 8 [place], 11 cm vascular sheath was placed into the external iliac vein. Through this access site, an 23 [place] ICE catheter was advanced with ease under fluoroscopic guidance to the level of the intrahepatic inferior vena cava. The jugular sheath was retracted into the right atrium and manometry was performed. A 5 French angled tip catheter was then directed into the RIGHT hepatic vein. Hepatic venogram was performed. These images demonstrated patent hepatic vein with no stenosis. The catheter was advanced to a wedge portion of the a patent vein over which the 10 French sheath was advanced into the RIGHT hepatic vein. Using ICE ultrasound visualization the catheter as RIGHT hepatic vein as well as the portal anatomy was defined. A planned exit site from the hepatic vein and puncture site from the portal vein was placed into a single sonographic plane. Under direct ultrasound visualization, the Argon Scorpion X needle was advanced into the central RIGHT portal vein. Hand injection of contrast confirmed position within the portal system. A Glidewire was then advanced into the splenic vein. A 5 French marking pigtail catheter was then advanced over the wire into the main portal vein and wire removed. Portal venogram was performed which demonstrated a patent portal vein. RIGHT atrial and portal pressures were performed. The tract was then dilated with an 8 mm x 4 mm Mustang balloon. At this point, secondary to acute angulation with a standard RHV to RPV branch approach a TIPS stent placement would prove  challenging. Multiple attempts were made at cannulating a middle hepatic vein, which appears to have a better trajectory for TIPS placement, though this was unsuccessful. The decision was made to abort and reconvene at a later date. The paracentesis drain, catheters and sheath were removed and manual compression was applied to the right internal jugular and right common femoral venous access sites until hemostasis was achieved. The patient was transferred to the PACU in stable condition. Pre-TIPS Mean Pressures (mmHg): Right atrium: 20 Portal vein: 44 Portosystemic gradient: 24 FINDINGS: -access via the RIGHT jugular and femoral veins. - ICE-guided portal vein access, however acute angulation with a RHV to RPV branch approach. - Aborted TIPS secondary to challenging portal vein anatomy - Elevated portosystemic gradient of 24 mmHg, consistent with portal HTN (RA 20, PV 44) - Therapeutic paracentesis with 3.8L of serous ascites removed. IMPRESSION: 1. Portal venogram, central and portal venous manometry consistent with portal hypertension. Portosystemic gradient markedly elevated at 44 mmHg. 2. Aborted TIPS secondary to challenging anatomy, as detailed above. PLAN: Appreciate management by Cardiology, Nephrology and GI teams. We will plan on medical optimization, serial paracentesis for symptomatic relief, and return to Vascular Interventional Radiology (VIR) for interval TIPS placement at a later date. Roanna Banning, MD [facility] Radiology Electronically Signed   By: Roanna Banning M.D.   On: 02/25/2022 12:45   IR US Guide Vasc Access Right  Result Date: 02/25/2022 CLINICAL DATA:  Briefly, 71 year old female with decompensated EtOH cirrhosis and portal hypertension. Liver Doppler with PV/SMV confluence thrombus. MELD = 18.  Child Pugh class B, 8 points. EXAM: Procedures: 1. ABORTED TRANSJUGULAR INTRAHEPATIC PORTOSYSTEMIC SHUNT (TIPS) 2. CENTRAL AND PORTAL VENOUS  MANOMETRY 3. PORTAL VENOGRAM 4. INTRACARDIAC ECHOCARDIOGRAPHY (ICE) 5. THERAPEUTIC PARACENTESIS Performing Physician: Roanna Banning, MD Assistant(s): Ruel Favors, MD A qualified trainee/resident or advanced practice provider (APP)  was not immediately available to assist with this case. MEDICATIONS: As antibiotic prophylaxis, Mefoxin 2 g IV was ordered pre-procedure and administered intravenously within one hour of incision. ANESTHESIA/SEDATION: Sedation by the Anesthesia Team was performed. Please see anesthesiology log for details. CONTRAST:  100 mL Visipaque 320, intravenous FLUOROSCOPY TIME:  Fluoroscopic dose; 368 mGy COMPLICATIONS: None immediate. PROCEDURE: Informed written consent was obtained from the patient and/or patient's representative after a thorough discussion of the procedural risks, benefits and alternatives. All questions were addressed. Maximal sterile barrier technique was utilized including caps, mask, sterile gowns, sterile gloves, sterile drape, hand hygiene and skin antiseptic. A timeout was performed prior to the initiation of the procedure. Initial ultrasound scanning demonstrates a large amount of ascites within the RIGHT abdomen. Under direct ultrasound guidance, an 8 Fr Safe-T-Centesis catheter was introduced. An ultrasound image was saved for documentation purposes. Therapeutic paracentesis was performed. A preliminary ultrasound of the RIGHT neck was performed and demonstrates a patent internal jugular vein. A permanent ultrasound image was recorded. Using a combination of fluoroscopy and ultrasound, an access site was determined. A small dermatotomy was made at the planned puncture site. Using ultrasound guidance, access into the RIGHT internal jugular vein was obtained with visualization of needle entry into the vessel using a standard micropuncture technique. A wire was advanced into the IVC and serial fascial dilation performed. A 10 French TIPS sheath was placed into the internal  jugular vein and advanced to the IVC. A preliminary ultrasound of the RIGHT groin was performed and demonstrates a patent right common femoral vein. A permanent ultrasound image was recorded. Using a combination of fluoroscopy and ultrasound, an access site was determined. A small dermatotomy was made at the planned puncture site. Using ultrasound guidance, access into the RIGHT common femoral vein was obtained with visualization of needle entry into the vessel using a standard micropuncture technique. A wire was advanced into the IVC insert all fascial dilation performed. An 8 [place], 11 cm vascular sheath was placed into the external iliac vein. Through this access site, an 26 [place] ICE catheter was advanced with ease under fluoroscopic guidance to the level of the intrahepatic inferior vena cava. The jugular sheath was retracted into the right atrium and manometry was performed. A 5 French angled tip catheter was then directed into the RIGHT hepatic vein. Hepatic venogram was performed. These images demonstrated patent hepatic vein with no stenosis. The catheter was advanced to a wedge portion of the a patent vein over which the 10 French sheath was advanced into the RIGHT hepatic vein. Using ICE ultrasound visualization the catheter as RIGHT hepatic vein as well as the portal anatomy was defined. A planned exit site from the hepatic vein and puncture site from the portal vein was placed into a single sonographic plane. Under direct ultrasound visualization, the Argon Scorpion X needle was advanced into the central RIGHT portal vein. Hand injection of contrast confirmed position within the portal system. A Glidewire was then advanced into the splenic vein. A 5 French marking pigtail catheter was then advanced over the wire into the main portal vein and wire removed. Portal venogram was performed which demonstrated a patent portal vein. RIGHT atrial and portal pressures were performed. The tract was then  dilated with an 8 mm x 4 mm Mustang balloon. At this point, secondary to acute angulation with a standard RHV to RPV branch approach a TIPS stent placement would prove challenging. Multiple attempts were made at cannulating a middle hepatic vein,  which appears to have a better trajectory for TIPS placement, though this was unsuccessful. The decision was made to abort and reconvene at a later date. The paracentesis drain, catheters and sheath were removed and manual compression was applied to the right internal jugular and right common femoral venous access sites until hemostasis was achieved. The patient was transferred to the PACU in stable condition. Pre-TIPS Mean Pressures (mmHg): Right atrium: 20 Portal vein: 44 Portosystemic gradient: 24 FINDINGS: -access via the RIGHT jugular and femoral veins. - ICE-guided portal vein access, however acute angulation with a RHV to RPV branch approach. - Aborted TIPS secondary to challenging portal vein anatomy - Elevated portosystemic gradient of 24 mmHg, consistent with portal HTN (RA 20, PV 44) - Therapeutic paracentesis with 3.8L of serous ascites removed. IMPRESSION: 1. Portal venogram, central and portal venous manometry consistent with portal hypertension. Portosystemic gradient markedly elevated at 44 mmHg. 2. Aborted TIPS secondary to challenging anatomy, as detailed above. PLAN: Appreciate management by Cardiology, Nephrology and GI teams. We will plan on medical optimization, serial paracentesis for symptomatic relief, and return to Vascular Interventional Radiology (VIR) for interval TIPS placement at a later date. Roanna Banning, MD [facility] Radiology Electronically Signed   By: Roanna Banning M.D.   On: 02/25/2022 12:45   IR Paracentesis  Result Date: 02/25/2022 CLINICAL DATA:  Briefly, 71 year old female with decompensated EtOH cirrhosis and portal hypertension. Liver Doppler with PV/SMV confluence thrombus.  MELD = 18.  Child Pugh class B, 8 points. EXAM: Procedures: 1. ABORTED TRANSJUGULAR INTRAHEPATIC PORTOSYSTEMIC SHUNT (TIPS) 2. CENTRAL AND PORTAL VENOUS MANOMETRY 3. PORTAL VENOGRAM 4. INTRACARDIAC ECHOCARDIOGRAPHY (ICE) 5. THERAPEUTIC PARACENTESIS Performing Physician: Roanna Banning, MD Assistant(s): Ruel Favors, MD A qualified trainee/resident or advanced practice provider (APP) was not immediately available to assist with this case. MEDICATIONS: As antibiotic prophylaxis, Mefoxin 2 g IV was ordered pre-procedure and administered intravenously within one hour of incision. ANESTHESIA/SEDATION: Sedation by the Anesthesia Team was performed. Please see anesthesiology log for details. CONTRAST:  100 mL Visipaque 320, intravenous FLUOROSCOPY TIME:  Fluoroscopic dose; 368 mGy COMPLICATIONS: None immediate. PROCEDURE: Informed written consent was obtained from the patient and/or patient's representative after a thorough discussion of the procedural risks, benefits and alternatives. All questions were addressed. Maximal sterile barrier technique was utilized including caps, mask, sterile gowns, sterile gloves, sterile drape, hand hygiene and skin antiseptic. A timeout was performed prior to the initiation of the procedure. Initial ultrasound scanning demonstrates a large amount of ascites within the RIGHT abdomen. Under direct ultrasound guidance, an 8 Fr Safe-T-Centesis catheter was introduced. An ultrasound image was saved for documentation purposes. Therapeutic paracentesis was performed. A preliminary ultrasound of the RIGHT neck was performed and demonstrates a patent internal jugular vein. A permanent ultrasound image was recorded. Using a combination of fluoroscopy and ultrasound, an access site was determined. A small dermatotomy was made at the planned puncture site. Using ultrasound guidance, access into the RIGHT internal jugular vein was obtained with visualization of needle entry into the vessel using a  standard micropuncture technique. A wire was advanced into the IVC and serial fascial dilation performed. A 10 French TIPS sheath was placed into the internal jugular vein and advanced to the IVC. A preliminary ultrasound of the RIGHT groin was performed and demonstrates a patent right common femoral vein. A permanent ultrasound image was recorded. Using a combination of fluoroscopy and ultrasound, an access site was determined. A small dermatotomy was made at the planned puncture  site. Using ultrasound guidance, access into the RIGHT common femoral vein was obtained with visualization of needle entry into the vessel using a standard micropuncture technique. A wire was advanced into the IVC insert all fascial dilation performed. An 8 [place], 11 cm vascular sheath was placed into the external iliac vein. Through this access site, an 87 [place] ICE catheter was advanced with ease under fluoroscopic guidance to the level of the intrahepatic inferior vena cava. The jugular sheath was retracted into the right atrium and manometry was performed. A 5 French angled tip catheter was then directed into the RIGHT hepatic vein. Hepatic venogram was performed. These images demonstrated patent hepatic vein with no stenosis. The catheter was advanced to a wedge portion of the a patent vein over which the 10 French sheath was advanced into the RIGHT hepatic vein. Using ICE ultrasound visualization the catheter as RIGHT hepatic vein as well as the portal anatomy was defined. A planned exit site from the hepatic vein and puncture site from the portal vein was placed into a single sonographic plane. Under direct ultrasound visualization, the Argon Scorpion X needle was advanced into the central RIGHT portal vein. Hand injection of contrast confirmed position within the portal system. A Glidewire was then advanced into the splenic vein. A 5 French marking pigtail catheter was then advanced over the wire into the main portal vein  and wire removed. Portal venogram was performed which demonstrated a patent portal vein. RIGHT atrial and portal pressures were performed. The tract was then dilated with an 8 mm x 4 mm Mustang balloon. At this point, secondary to acute angulation with a standard RHV to RPV branch approach a TIPS stent placement would prove challenging. Multiple attempts were made at cannulating a middle hepatic vein, which appears to have a better trajectory for TIPS placement, though this was unsuccessful. The decision was made to abort and reconvene at a later date. The paracentesis drain, catheters and sheath were removed and manual compression was applied to the right internal jugular and right common femoral venous access sites until hemostasis was achieved. The patient was transferred to the PACU in stable condition. Pre-TIPS Mean Pressures (mmHg): Right atrium: 20 Portal vein: 44 Portosystemic gradient: 24 FINDINGS: -access via the RIGHT jugular and femoral veins. - ICE-guided portal vein access, however acute angulation with a RHV to RPV branch approach. - Aborted TIPS secondary to challenging portal vein anatomy - Elevated portosystemic gradient of 24 mmHg, consistent with portal HTN (RA 20, PV 44) - Therapeutic paracentesis with 3.8L of serous ascites removed. IMPRESSION: 1. Portal venogram, central and portal venous manometry consistent with portal hypertension. Portosystemic gradient markedly elevated at 44 mmHg. 2. Aborted TIPS secondary to challenging anatomy, as detailed above. PLAN: Appreciate management by Cardiology, Nephrology and GI teams. We will plan on medical optimization, serial paracentesis for symptomatic relief, and return to Vascular Interventional Radiology (VIR) for interval TIPS placement at a later date. Roanna Banning, MD [facility] Radiology Electronically Signed   By: Roanna Banning M.D.   On: 02/25/2022 12:45        Scheduled Meds:  allopurinol   200 mg Oral Daily   amiodarone  200 mg Oral Daily   Chlorhexidine Gluconate Cloth  6 each Topical Daily   levothyroxine  200 mcg Oral q morning   midodrine  15 mg Oral TID WC   multivitamin with minerals  1 tablet Oral Daily   pantoprazole  40 mg Oral BID  potassium chloride  40 mEq Oral BID   sertraline  50 mg Oral Daily   vancomycin  125 mg Oral QID   Continuous Infusions:     LOS: 2 days    Time spent: 35 minutes     Dorcas Carrow, MD [facility] Pager [phone]

## 2022-02-25 NOTE — Progress Notes (Signed)
Placed DNR armband on pt. ?

## 2022-02-25 NOTE — Progress Notes (Signed)
? ? ?Referring Physician(s): [name], MD ? ?Supervising Physician: Michaelle Birks ? ?Patient Status:  [facility] - In-pt ? ?Chief Complaint: Follow up aborted TIPS 02/24/22 in IR ? ?Subjective: ? ?Patient seen in her room with Dr. Maryelizabeth Kaufmann, she denies any complaints today. Required lasix overnight for fluid overload. Discussed that TIPS was aborted due to challenging portal vein anatomy and likelihood that if TIPS were placed in the typical fashion it would likely clot quickly, so plan is to try to place a middle to right TIPS once medically optimized (likely about 2 weeks). Patient stated understanding and agreement. ? ?Allergies: ?Colchicine ? ?Medications: ?Prior to Admission medications   ?Medication Sig Start Date End Date Taking? Authorizing Provider  ?acetaminophen (TYLENOL) 500 MG tablet Take 1,000 mg by mouth every 6 (six) hours as needed for headache or mild pain.   Yes [provider]  ?alendronate (FOSAMAX) 70 MG tablet Take 70 mg by mouth every Monday.   Yes [provider]  ?allopurinol (ZYLOPRIM) 100 MG tablet Take 200 mg by mouth daily. 01/20/20  Yes [provider]  ?amiodarone (PACERONE) 200 MG tablet Take 200 mg by mouth in the morning and at bedtime.  02/27/18  Yes [provider]  ?Cholecalciferol (VITAMIN D) 50 MCG (2000 UT) tablet Take 2,000 Units by mouth daily. 05/03/21  Yes [provider]  ?furosemide (LASIX) 20 MG tablet Take 20 mg by mouth daily. 02/04/22  Yes [provider]  ?iron polysaccharides (NIFEREX) 150 MG capsule Take 1 capsule (150 mg total) by mouth daily. 01/27/22  Yes Kathie Dike, MD  ?levothyroxine (SYNTHROID) 200 MCG tablet Take 200 mcg by mouth every morning. 12/26/21  Yes [provider]  ?midodrine (PROAMATINE) 10 MG tablet Take 1 tablet (10 mg total) by mouth 3 (three) times daily with meals. 02/01/22  Yes Barb Merino, MD  ?Multiple Vitamins-Minerals (PRESERVISION AREDS 2 PO) Take 1 capsule by mouth daily.  Preservision AREDS-2 250 mg- 90 mg- 40 mg-1 mg   Yes [provider]  ?pantoprazole (PROTONIX) 40 MG tablet Take 1 tablet (40 mg total) by mouth 2 (two) times daily before a meal. 01/26/22 02/25/22 Yes Memon, Jolaine Artist, MD  ?polyethylene glycol (MIRALAX / GLYCOLAX) 17 g packet Take 17 g by mouth daily as needed for mild constipation. 01/26/22  Yes Kathie Dike, MD  ?spironolactone (ALDACTONE) 50 MG tablet Take 50 mg by mouth daily. 02/04/22  Yes [provider]  ? ? ? ?Vital Signs: ?BP (!) 93/46 (BP Location: Left Arm)   Pulse 63   Temp 98.5 ?F (36.9 ?C) (Oral)   Resp 17   Ht '5\' 2"'$  (1.575 m)   Wt 169 lb 12.1 oz (77 kg)   SpO2 98%   BMI 31.05 kg/m?  ? ?Physical Exam ?Vitals and nursing note reviewed.  ?Constitutional:   ?   General: She is not in acute distress. ?HENT:  ?   Head: Normocephalic.  ?Eyes:  ?   General: No scleral icterus. ?Cardiovascular:  ?   Rate and Rhythm: Normal rate and regular rhythm.  ?   Comments: (+) right IJ puncture site clean, dry, small amount of dried blood on dressing but no active bleeding. Non tender, soft. Dressing removed. ?(+) right CFV puncture site clean, dry, no active bleeding. Non tender, soft. Dressing removed. ? ?Pulmonary:  ?   Effort: Pulmonary effort is normal.  ?   Breath sounds: Normal breath sounds.  ?   Comments: (+) 4L supplement oxygen via [place] ?Abdominal:  ?  General: There is no distension.  ?   Palpations: Abdomen is soft.  ?   Tenderness: There is no abdominal tenderness.  ?   Comments: (+) RLQ paracentesis puncture site clean, dry, dressed. No bleeding or leakage noted. Non tender, soft. Dressing removed.  ?Genitourinary: ?   Comments: (+) foley draining clear yellow urine ?Skin: ?   General: Skin is warm and dry.  ?   Coloration: Skin is not jaundiced.  ?Neurological:  ?   Mental Status: She is alert. Mental status is at baseline.  ? ? ?Imaging: ?DG Chest 1 View ? ?Result Date: 02/24/2022 ?CLINICAL DATA:  Shortness of breath EXAM: CHEST  1  VIEW COMPARISON:  Previous studies including the examination of 02/20/2022 FINDINGS: Transverse diameter of heart is increased. Dense calcification is seen in the mitral annulus. Central pulmonary vessels are prominent. Increased interstitial markings are seen in the parahilar regions. There are small linear densities in the right mid and both lower lung fields. Costophrenic angles are clear. There is no pneumothorax. IMPRESSION: Cardiomegaly. Central pulmonary vessels are more prominent suggesting CHF. There is increase in interstitial markings in the parahilar regions, possibly suggesting interstitial edema. There are linear densities in the parahilar regions and lower lung fields suggesting subsegmental atelectasis. Electronically Signed   By: Elmer Picker M.D.   On: 02/24/2022 17:23  ? ?US RENAL ? ?Result Date: 02/22/2022 ?CLINICAL DATA:  Renal dysfunction EXAM: RENAL / URINARY TRACT ULTRASOUND COMPLETE COMPARISON:  CT done on 01/29/2022 FINDINGS: Right Kidney: Renal measurements: 9.5 x 5.2 by 4.1 cm = volume: 105 mL. Echogenicity within normal limits. No mass or hydronephrosis visualized. Left Kidney: Renal measurements: 9.2 x 4.7 x 4 cm = volume: 90 mL. Echogenicity within normal limits. No mass or hydronephrosis visualized. Bladder: Appears normal for degree of bladder distention. Other: Moderate to large ascites is present. There is nodularity in the liver surface. IMPRESSION: There is no hydronephrosis.  Moderate to large ascites. Electronically Signed   By: Elmer Picker M.D.   On: 02/22/2022 16:55  ? ?US Paracentesis ? ?Result Date: 02/21/2022 ?INDICATION: Patient with cirrhosis and recurrent ascites, request received for diagnostic and therapeutic paracentesis. EXAM: ULTRASOUND GUIDED PARACENTESIS MEDICATIONS: Local 1% lidocaine only COMPLICATIONS: None immediate. PROCEDURE: Informed written consent was obtained from the patient after a discussion of the risks, benefits and alternatives to  treatment. A timeout was performed prior to the initiation of the procedure. Initial ultrasound scanning demonstrates a large amount of ascites within the right upper abdominal quadrant. The right upper abdomen was prepped and draped in the usual sterile fashion. 1% lidocaine was used for local anesthesia. Following this, a 19 gauge, 7-cm, Yueh catheter was introduced. An ultrasound image was saved for documentation purposes. The paracentesis was performed. The catheter was removed and a dressing was applied. The patient tolerated the procedure well without immediate post procedural complication. Patient received post-procedure intravenous albumin; see nursing notes for details. FINDINGS: A total of approximately 6.6 L of clear yellow fluid was removed. Samples were sent to the laboratory as requested by the clinical team. IMPRESSION: Successful ultrasound-guided paracentesis yielding 6.6 Liters liters of peritoneal fluid. Read by Tsosie Billing PA-C, PLAN: The patient has required >/=2 paracenteses in a 30 day period and a formal evaluation by the [facility] Interventional Radiology Portal Hypertension [facility] has been arranged. Michaelle Birks, MD [facility] Radiology Electronically Signed   By: Michaelle Birks M.D.   On: 02/21/2022 17:00  ? ?US LIVER DOPPLER ? ?  Result Date: 02/22/2022 ?CLINICAL DATA:  Ascites.  Evaluate for portal vein thrombosis. EXAM: DUPLEX ULTRASOUND OF LIVER TECHNIQUE: Color and duplex Doppler ultrasound was performed to evaluate the hepatic in-flow and out-flow vessels. COMPARISON:  CT AP, 01/29/2022 and 01/04/2019. IR ultrasound, most recently 02/21/2022. FINDINGS: Liver: Increased hepatic echogenicity with coarse parenchymal echotexture. Macronodular hepatic contour. No focal lesion, mass or intrahepatic biliary ductal dilatation. Main Portal Vein size: 1.1 cm Portal Vein Velocities (normal < 40 cm/s PSV) Main Prox:  45 cm/sec Main Mid: 44 cm/sec  Main Dist:  185 cm/sec Right: 285 cm/sec Left: 203 cm/sec Hepatic Vein Velocities Right:  28 cm/sec Middle:  27 cm/sec Left:  29 cm/sec IVC: Present and patent with normal respiratory phasicity. Hepatic Artery Velocity:  1

## 2022-02-25 NOTE — Consult Note (Signed)
Referring Provider: Dr. Jerral Ralph Primary Care Physician:  Sigmund Hazel, MD Primary Gastroenterologist:  Gentry Fitz  Reason for Consultation:  Cirrhosis  HPI: Diane Pruitt is a 71 y.o. female with multiple medical problems including decompensated cirrhosis with refractory ascites transferred from [facility] on 02/23/22 for TIPS procedure. IR unable to do TIPS yesterday due to altered vascular anatomy and IR will re-try this week. 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 Patient found to have C. Diff at [facility] and on PO Vancomycin for that. Denies abdominal pain, N/V. Denies diarrhea overnight.   Past Medical History:  Diagnosis Date   Atrial fibrillation (HCC)    Barrett esophagus    Chronic kidney disease (CKD), stage III (moderate) (HCC)    Cirrhosis (HCC)    Dysrhythmia    Atrial Fibrillation 2 years ago   Hypertension    Ischemic stroke (HCC) 01/25/2018   Stroke [facility])     Past Surgical History:  Procedure Laterality Date   CESAREAN SECTION     ESOPHAGOGASTRODUODENOSCOPY (EGD) WITH PROPOFOL N/A 01/05/2022   Procedure: ESOPHAGOGASTRODUODENOSCOPY (EGD) WITH PROPOFOL;  Surgeon: Kerin Salen, MD;  Location: [facility] ENDOSCOPY;  Service: Gastroenterology;  Laterality: N/A;   EYE SURGERY     INTRAOCULAR LENS INSERTION Bilateral About 3 years ago   IR PARACENTESIS  02/12/2022   IR PARACENTESIS  02/24/2022   IR TIPS  02/24/2022   IR US GUIDE VASC ACCESS RIGHT  02/24/2022   KYPHOPLASTY N/A 02/18/2020   Procedure: KYPHOPLASTY L2;  Surgeon: Venita Lick, MD;  Location: [facility] OR;  Service: Orthopedics;  Laterality: N/A;  60 mins   TONSILLECTOMY     TUBAL LIGATION      Prior to Admission medications   Medication Sig Start Date End Date Taking? Authorizing Provider  acetaminophen (TYLENOL) 500 MG tablet Take 1,000 mg by mouth every 6 (six) hours as needed for headache or mild pain.   Yes [provider]  alendronate (FOSAMAX) 70 MG tablet Take 70 mg by mouth every Monday.   Yes  [provider]  allopurinol (ZYLOPRIM) 100 MG tablet Take 200 mg by mouth daily. 01/20/20  Yes [provider]  amiodarone (PACERONE) 200 MG tablet Take 200 mg by mouth in the morning and at bedtime.  02/27/18  Yes [provider]  Cholecalciferol (VITAMIN D) 50 MCG (2000 UT) tablet Take 2,000 Units by mouth daily. 05/03/21  Yes [provider]  furosemide (LASIX) 20 MG tablet Take 20 mg by mouth daily. 02/04/22  Yes [provider]  iron polysaccharides (NIFEREX) 150 MG capsule Take 1 capsule (150 mg total) by mouth daily. 01/27/22  Yes Erick Blinks, MD  levothyroxine (SYNTHROID) 200 MCG tablet Take 200 mcg by mouth every morning. 12/26/21  Yes [provider]  midodrine (PROAMATINE) 10 MG tablet Take 1 tablet (10 mg total) by mouth 3 (three) times daily with meals. 02/01/22  Yes Dorcas Carrow, MD  Multiple Vitamins-Minerals (PRESERVISION AREDS 2 PO) Take 1 capsule by mouth daily. Preservision AREDS-2 250 mg- 90 mg- 40 mg-1 mg   Yes [provider]  pantoprazole (PROTONIX) 40 MG tablet Take 1 tablet (40 mg total) by mouth 2 (two) times daily before a meal. 01/26/22 02/25/22 Yes Memon, Durward Mallard, MD  polyethylene glycol (MIRALAX / GLYCOLAX) 17 g packet Take 17 g by mouth daily as needed for mild constipation. 01/26/22  Yes Erick Blinks, MD  spironolactone (ALDACTONE) 50 MG tablet Take 50 mg by mouth daily. 02/04/22  Yes [provider]  Scheduled Meds:  allopurinol  200 mg Oral Daily   amiodarone  200 mg Oral Daily   Chlorhexidine Gluconate Cloth  6 each Topical Daily   levothyroxine  200 mcg Oral q morning   midodrine  15 mg Oral TID WC   multivitamin with minerals  1 tablet Oral Daily   pantoprazole  40 mg Oral BID   potassium chloride  40 mEq Oral BID   sertraline  50 mg Oral Daily   vancomycin  125 mg Oral QID   Continuous Infusions: PRN Meds:.acetaminophen **OR** acetaminophen, ondansetron **OR** ondansetron (ZOFRAN)  IV  Allergies as of 02/23/2022 - Review Complete 02/23/2022  Allergen Reaction Noted   Colchicine Other (See Comments) 07/17/2021    Family History  Problem Relation Age of Onset   Hypertension Mother    Hyperlipidemia Mother    Atrial fibrillation Mother    Hyperlipidemia Father    Atrial fibrillation Father     Social History   Socioeconomic History   Marital status: Divorced    Spouse name: Not on file   Number of children: Not on file   Years of education: Not on file   Highest education level: Not on file  Occupational History   Not on file  Tobacco Use   Smoking status: Never   Smokeless tobacco: Never  Vaping Use   Vaping Use: Never used  Substance and Sexual Activity   Alcohol use: Never   Drug use: Never   Sexual activity: Not on file  Other Topics Concern   Not on file  Social History Narrative   Not on file   Social Determinants of Health   Financial Resource Strain: Not on file  Food Insecurity: Not on file  Transportation Needs: Not on file  Physical Activity: Not on file  Stress: Not on file  Social Connections: Not on file  Intimate Partner Violence: Not on file    Review of Systems: All negative except as stated above in HPI.  Physical Exam: Vital signs: Vitals:   02/25/22 0552 02/25/22 0818  BP: (!) 88/52 (!) 93/46  Pulse: 65 63  Resp: 16 17  Temp: 98.2 F (36.8 C) 98.5 F (36.9 C)  SpO2: 99% 98%   Last BM Date : 02/22/22 General: Lethargic, elderly, obese, no acute distress Head: normocephalic, atraumatic Eyes: anicteric sclera ENT: oropharynx clear Neck: supple, nontender Lungs:  Clear throughout to auscultation.   No wheezes, crackles, or rhonchi. No acute distress. Heart:  Regular rate and rhythm; no murmurs, clicks, rubs,  or gallops. Abdomen: +distention, nontender, soft, +BS  Rectal:  Deferred Ext: no edema  GI:  Lab Results: Recent Labs    02/23/22 0446 02/24/22 0026 02/24/22 1325 02/24/22 1537 02/25/22 0854   WBC 17.9* 17.4*  --   --  15.8*  HGB 9.1* 9.1* 10.5* 9.2* 9.0*  HCT 28.7* 28.0* 31.0* 27.0* 27.5*  PLT 191 173  --   --  134*   BMET Recent Labs    02/23/22 0446 02/24/22 0026 02/24/22 1325 02/24/22 1537 02/25/22 0854  NA 133* 132* 136 136 133*  K 3.5 3.1* 3.0* 2.9* 3.9  CL 107 106  --   --  105  CO2 17* 17*  --   --  18*  GLUCOSE 93 95  --   --  86  BUN 38* 33*  --   --  32*  CREATININE 2.25* 2.07*  --   --  2.05*  CALCIUM 7.8* 7.9*  --   --  7.9*   LFT Recent Labs    02/25/22 0854  PROT 5.4*  ALBUMIN 3.0*  AST 219*  ALT 70*  ALKPHOS 98  BILITOT 1.5*   PT/INR Recent Labs    02/23/22 0855 02/24/22 0026  LABPROT 18.3* 17.9*  INR 1.5* 1.5*     Studies/Results: DG Chest 1 View  Result Date: 02/24/2022 CLINICAL DATA:  Shortness of breath EXAM: CHEST  1 VIEW COMPARISON:  Previous studies including the examination of 02/20/2022 FINDINGS: Transverse diameter of heart is increased. Dense calcification is seen in the mitral annulus. Central pulmonary vessels are prominent. Increased interstitial markings are seen in the parahilar regions. There are small linear densities in the right mid and both lower lung fields. Costophrenic angles are clear. There is no pneumothorax. IMPRESSION: Cardiomegaly. Central pulmonary vessels are more prominent suggesting CHF. There is increase in interstitial markings in the parahilar regions, possibly suggesting interstitial edema. There are linear densities in the parahilar regions and lower lung fields suggesting subsegmental atelectasis. Electronically Signed   By: Ernie Avena M.D.   On: 02/24/2022 17:23   IR Tips  Result Date: 02/25/2022 CLINICAL DATA:  Briefly, 71 year old female with decompensated EtOH cirrhosis and portal hypertension. Liver Doppler with PV/SMV confluence thrombus. MELD = 18.  Child Pugh class B, 8 points. EXAM: Procedures: 1. ABORTED TRANSJUGULAR INTRAHEPATIC PORTOSYSTEMIC SHUNT (TIPS) 2. CENTRAL AND PORTAL  VENOUS MANOMETRY 3. PORTAL VENOGRAM 4. INTRACARDIAC ECHOCARDIOGRAPHY (ICE) 5. THERAPEUTIC PARACENTESIS Performing Physician: Roanna Banning, MD Assistant(s): Ruel Favors, MD A qualified trainee/resident or advanced practice provider (APP) was not immediately available to assist with this case. MEDICATIONS: As antibiotic prophylaxis, Mefoxin 2 g IV was ordered pre-procedure and administered intravenously within one hour of incision. ANESTHESIA/SEDATION: Sedation by the Anesthesia Team was performed. Please see anesthesiology log for details. CONTRAST:  100 mL Visipaque 320, intravenous FLUOROSCOPY TIME:  Fluoroscopic dose; 368 mGy COMPLICATIONS: None immediate. PROCEDURE: Informed written consent was obtained from the patient and/or patient's representative after a thorough discussion of the procedural risks, benefits and alternatives. All questions were addressed. Maximal sterile barrier technique was utilized including caps, mask, sterile gowns, sterile gloves, sterile drape, hand hygiene and skin antiseptic. A timeout was performed prior to the initiation of the procedure. Initial ultrasound scanning demonstrates a large amount of ascites within the RIGHT abdomen. Under direct ultrasound guidance, an 8 Fr Safe-T-Centesis catheter was introduced. An ultrasound image was saved for documentation purposes. Therapeutic paracentesis was performed. A preliminary ultrasound of the RIGHT neck was performed and demonstrates a patent internal jugular vein. A permanent ultrasound image was recorded. Using a combination of fluoroscopy and ultrasound, an access site was determined. A small dermatotomy was made at the planned puncture site. Using ultrasound guidance, access into the RIGHT internal jugular vein was obtained with visualization of needle entry into the vessel using a standard micropuncture technique. A wire was advanced into the IVC and serial fascial dilation performed. A 10 French TIPS sheath was placed into the  internal jugular vein and advanced to the IVC. A preliminary ultrasound of the RIGHT groin was performed and demonstrates a patent right common femoral vein. A permanent ultrasound image was recorded. Using a combination of fluoroscopy and ultrasound, an access site was determined. A small dermatotomy was made at the planned puncture site. Using ultrasound guidance, access into the RIGHT common femoral vein was obtained with visualization of needle entry into the vessel using a standard micropuncture technique. A wire was advanced into the IVC insert all fascial dilation performed.  An 8 [place], 11 cm vascular sheath was placed into the external iliac vein. Through this access site, an 84 [place] ICE catheter was advanced with ease under fluoroscopic guidance to the level of the intrahepatic inferior vena cava. The jugular sheath was retracted into the right atrium and manometry was performed. A 5 French angled tip catheter was then directed into the RIGHT hepatic vein. Hepatic venogram was performed. These images demonstrated patent hepatic vein with no stenosis. The catheter was advanced to a wedge portion of the a patent vein over which the 10 French sheath was advanced into the RIGHT hepatic vein. Using ICE ultrasound visualization the catheter as RIGHT hepatic vein as well as the portal anatomy was defined. A planned exit site from the hepatic vein and puncture site from the portal vein was placed into a single sonographic plane. Under direct ultrasound visualization, the Argon Scorpion X needle was advanced into the central RIGHT portal vein. Hand injection of contrast confirmed position within the portal system. A Glidewire was then advanced into the splenic vein. A 5 French marking pigtail catheter was then advanced over the wire into the main portal vein and wire removed. Portal venogram was performed which demonstrated a patent portal vein. RIGHT atrial and portal pressures were performed. The tract  was then dilated with an 8 mm x 4 mm Mustang balloon. At this point, secondary to acute angulation with a standard RHV to RPV branch approach a TIPS stent placement would prove challenging. Multiple attempts were made at cannulating a middle hepatic vein, which appears to have a better trajectory for TIPS placement, though this was unsuccessful. The decision was made to abort and reconvene at a later date. The paracentesis drain, catheters and sheath were removed and manual compression was applied to the right internal jugular and right common femoral venous access sites until hemostasis was achieved. The patient was transferred to the PACU in stable condition. Pre-TIPS Mean Pressures (mmHg): Right atrium: 20 Portal vein: 44 Portosystemic gradient: 24 FINDINGS: -access via the RIGHT jugular and femoral veins. - ICE-guided portal vein access, however acute angulation with a RHV to RPV branch approach. - Aborted TIPS secondary to challenging portal vein anatomy - Elevated portosystemic gradient of 24 mmHg, consistent with portal HTN (RA 20, PV 44) - Therapeutic paracentesis with 3.8L of serous ascites removed. IMPRESSION: 1. Portal venogram, central and portal venous manometry consistent with portal hypertension. Portosystemic gradient markedly elevated at 44 mmHg. 2. Aborted TIPS secondary to challenging anatomy, as detailed above. PLAN: Appreciate management by Cardiology, Nephrology and GI teams. We will plan on medical optimization, serial paracentesis for symptomatic relief, and return to Vascular Interventional Radiology (VIR) for interval TIPS placement at a later date. Roanna Banning, MD [facility] Radiology Electronically Signed   By: Roanna Banning M.D.   On: 02/25/2022 12:45   IR US Guide Vasc Access Right  Result Date: 02/25/2022 CLINICAL DATA:  Briefly, 71 year old female with decompensated EtOH cirrhosis and portal hypertension. Liver Doppler with PV/SMV  confluence thrombus. MELD = 18.  Child Pugh class B, 8 points. EXAM: Procedures: 1. ABORTED TRANSJUGULAR INTRAHEPATIC PORTOSYSTEMIC SHUNT (TIPS) 2. CENTRAL AND PORTAL VENOUS MANOMETRY 3. PORTAL VENOGRAM 4. INTRACARDIAC ECHOCARDIOGRAPHY (ICE) 5. THERAPEUTIC PARACENTESIS Performing Physician: Roanna Banning, MD Assistant(s): Ruel Favors, MD A qualified trainee/resident or advanced practice provider (APP) was not immediately available to assist with this case. MEDICATIONS: As antibiotic prophylaxis, Mefoxin 2 g IV was ordered pre-procedure and administered intravenously within one hour  of incision. ANESTHESIA/SEDATION: Sedation by the Anesthesia Team was performed. Please see anesthesiology log for details. CONTRAST:  100 mL Visipaque 320, intravenous FLUOROSCOPY TIME:  Fluoroscopic dose; 368 mGy COMPLICATIONS: None immediate. PROCEDURE: Informed written consent was obtained from the patient and/or patient's representative after a thorough discussion of the procedural risks, benefits and alternatives. All questions were addressed. Maximal sterile barrier technique was utilized including caps, mask, sterile gowns, sterile gloves, sterile drape, hand hygiene and skin antiseptic. A timeout was performed prior to the initiation of the procedure. Initial ultrasound scanning demonstrates a large amount of ascites within the RIGHT abdomen. Under direct ultrasound guidance, an 8 Fr Safe-T-Centesis catheter was introduced. An ultrasound image was saved for documentation purposes. Therapeutic paracentesis was performed. A preliminary ultrasound of the RIGHT neck was performed and demonstrates a patent internal jugular vein. A permanent ultrasound image was recorded. Using a combination of fluoroscopy and ultrasound, an access site was determined. A small dermatotomy was made at the planned puncture site. Using ultrasound guidance, access into the RIGHT internal jugular vein was obtained with visualization of needle entry into the  vessel using a standard micropuncture technique. A wire was advanced into the IVC and serial fascial dilation performed. A 10 French TIPS sheath was placed into the internal jugular vein and advanced to the IVC. A preliminary ultrasound of the RIGHT groin was performed and demonstrates a patent right common femoral vein. A permanent ultrasound image was recorded. Using a combination of fluoroscopy and ultrasound, an access site was determined. A small dermatotomy was made at the planned puncture site. Using ultrasound guidance, access into the RIGHT common femoral vein was obtained with visualization of needle entry into the vessel using a standard micropuncture technique. A wire was advanced into the IVC insert all fascial dilation performed. An 8 [place], 11 cm vascular sheath was placed into the external iliac vein. Through this access site, an 65 [place] ICE catheter was advanced with ease under fluoroscopic guidance to the level of the intrahepatic inferior vena cava. The jugular sheath was retracted into the right atrium and manometry was performed. A 5 French angled tip catheter was then directed into the RIGHT hepatic vein. Hepatic venogram was performed. These images demonstrated patent hepatic vein with no stenosis. The catheter was advanced to a wedge portion of the a patent vein over which the 10 French sheath was advanced into the RIGHT hepatic vein. Using ICE ultrasound visualization the catheter as RIGHT hepatic vein as well as the portal anatomy was defined. A planned exit site from the hepatic vein and puncture site from the portal vein was placed into a single sonographic plane. Under direct ultrasound visualization, the Argon Scorpion X needle was advanced into the central RIGHT portal vein. Hand injection of contrast confirmed position within the portal system. A Glidewire was then advanced into the splenic vein. A 5 French marking pigtail catheter was then advanced over the wire into the  main portal vein and wire removed. Portal venogram was performed which demonstrated a patent portal vein. RIGHT atrial and portal pressures were performed. The tract was then dilated with an 8 mm x 4 mm Mustang balloon. At this point, secondary to acute angulation with a standard RHV to RPV branch approach a TIPS stent placement would prove challenging. Multiple attempts were made at cannulating a middle hepatic vein, which appears to have a better trajectory for TIPS placement, though this was unsuccessful. The decision was made to abort and reconvene at a later date.  The paracentesis drain, catheters and sheath were removed and manual compression was applied to the right internal jugular and right common femoral venous access sites until hemostasis was achieved. The patient was transferred to the PACU in stable condition. Pre-TIPS Mean Pressures (mmHg): Right atrium: 20 Portal vein: 44 Portosystemic gradient: 24 FINDINGS: -access via the RIGHT jugular and femoral veins. - ICE-guided portal vein access, however acute angulation with a RHV to RPV branch approach. - Aborted TIPS secondary to challenging portal vein anatomy - Elevated portosystemic gradient of 24 mmHg, consistent with portal HTN (RA 20, PV 44) - Therapeutic paracentesis with 3.8L of serous ascites removed. IMPRESSION: 1. Portal venogram, central and portal venous manometry consistent with portal hypertension. Portosystemic gradient markedly elevated at 44 mmHg. 2. Aborted TIPS secondary to challenging anatomy, as detailed above. PLAN: Appreciate management by Cardiology, Nephrology and GI teams. We will plan on medical optimization, serial paracentesis for symptomatic relief, and return to Vascular Interventional Radiology (VIR) for interval TIPS placement at a later date. Roanna Banning, MD [facility] Radiology Electronically Signed   By: Roanna Banning M.D.   On: 02/25/2022 12:45   IR  Paracentesis  Result Date: 02/25/2022 CLINICAL DATA:  Briefly, 71 year old female with decompensated EtOH cirrhosis and portal hypertension. Liver Doppler with PV/SMV confluence thrombus. MELD = 18.  Child Pugh class B, 8 points. EXAM: Procedures: 1. ABORTED TRANSJUGULAR INTRAHEPATIC PORTOSYSTEMIC SHUNT (TIPS) 2. CENTRAL AND PORTAL VENOUS MANOMETRY 3. PORTAL VENOGRAM 4. INTRACARDIAC ECHOCARDIOGRAPHY (ICE) 5. THERAPEUTIC PARACENTESIS Performing Physician: Roanna Banning, MD Assistant(s): Ruel Favors, MD A qualified trainee/resident or advanced practice provider (APP) was not immediately available to assist with this case. MEDICATIONS: As antibiotic prophylaxis, Mefoxin 2 g IV was ordered pre-procedure and administered intravenously within one hour of incision. ANESTHESIA/SEDATION: Sedation by the Anesthesia Team was performed. Please see anesthesiology log for details. CONTRAST:  100 mL Visipaque 320, intravenous FLUOROSCOPY TIME:  Fluoroscopic dose; 368 mGy COMPLICATIONS: None immediate. PROCEDURE: Informed written consent was obtained from the patient and/or patient's representative after a thorough discussion of the procedural risks, benefits and alternatives. All questions were addressed. Maximal sterile barrier technique was utilized including caps, mask, sterile gowns, sterile gloves, sterile drape, hand hygiene and skin antiseptic. A timeout was performed prior to the initiation of the procedure. Initial ultrasound scanning demonstrates a large amount of ascites within the RIGHT abdomen. Under direct ultrasound guidance, an 8 Fr Safe-T-Centesis catheter was introduced. An ultrasound image was saved for documentation purposes. Therapeutic paracentesis was performed. A preliminary ultrasound of the RIGHT neck was performed and demonstrates a patent internal jugular vein. A permanent ultrasound image was recorded. Using a combination of fluoroscopy and ultrasound, an access site was determined. A small  dermatotomy was made at the planned puncture site. Using ultrasound guidance, access into the RIGHT internal jugular vein was obtained with visualization of needle entry into the vessel using a standard micropuncture technique. A wire was advanced into the IVC and serial fascial dilation performed. A 10 French TIPS sheath was placed into the internal jugular vein and advanced to the IVC. A preliminary ultrasound of the RIGHT groin was performed and demonstrates a patent right common femoral vein. A permanent ultrasound image was recorded. Using a combination of fluoroscopy and ultrasound, an access site was determined. A small dermatotomy was made at the planned puncture site. Using ultrasound guidance, access into the RIGHT common femoral vein was obtained with visualization of needle entry into the vessel using a standard micropuncture technique.  A wire was advanced into the IVC insert all fascial dilation performed. An 8 [place], 11 cm vascular sheath was placed into the external iliac vein. Through this access site, an 73 [place] ICE catheter was advanced with ease under fluoroscopic guidance to the level of the intrahepatic inferior vena cava. The jugular sheath was retracted into the right atrium and manometry was performed. A 5 French angled tip catheter was then directed into the RIGHT hepatic vein. Hepatic venogram was performed. These images demonstrated patent hepatic vein with no stenosis. The catheter was advanced to a wedge portion of the a patent vein over which the 10 French sheath was advanced into the RIGHT hepatic vein. Using ICE ultrasound visualization the catheter as RIGHT hepatic vein as well as the portal anatomy was defined. A planned exit site from the hepatic vein and puncture site from the portal vein was placed into a single sonographic plane. Under direct ultrasound visualization, the Argon Scorpion X needle was advanced into the central RIGHT portal vein. Hand injection of  contrast confirmed position within the portal system. A Glidewire was then advanced into the splenic vein. A 5 French marking pigtail catheter was then advanced over the wire into the main portal vein and wire removed. Portal venogram was performed which demonstrated a patent portal vein. RIGHT atrial and portal pressures were performed. The tract was then dilated with an 8 mm x 4 mm Mustang balloon. At this point, secondary to acute angulation with a standard RHV to RPV branch approach a TIPS stent placement would prove challenging. Multiple attempts were made at cannulating a middle hepatic vein, which appears to have a better trajectory for TIPS placement, though this was unsuccessful. The decision was made to abort and reconvene at a later date. The paracentesis drain, catheters and sheath were removed and manual compression was applied to the right internal jugular and right common femoral venous access sites until hemostasis was achieved. The patient was transferred to the PACU in stable condition. Pre-TIPS Mean Pressures (mmHg): Right atrium: 20 Portal vein: 44 Portosystemic gradient: 24 FINDINGS: -access via the RIGHT jugular and femoral veins. - ICE-guided portal vein access, however acute angulation with a RHV to RPV branch approach. - Aborted TIPS secondary to challenging portal vein anatomy - Elevated portosystemic gradient of 24 mmHg, consistent with portal HTN (RA 20, PV 44) - Therapeutic paracentesis with 3.8L of serous ascites removed. IMPRESSION: 1. Portal venogram, central and portal venous manometry consistent with portal hypertension. Portosystemic gradient markedly elevated at 44 mmHg. 2. Aborted TIPS secondary to challenging anatomy, as detailed above. PLAN: Appreciate management by Cardiology, Nephrology and GI teams. We will plan on medical optimization, serial paracentesis for symptomatic relief, and return to Vascular Interventional Radiology (VIR) for interval TIPS placement at a later  date. Roanna Banning, MD [facility] Electronically Signed   By: Roanna Banning M.D.   On: 02/25/2022 12:45    Impression/Plan: Decompensated cirrhosis with refractory ascites and question of hepatorenal syndrome. TIPS could not be completed due to vascular anatomy. Needs a renal consult to evaluate for HRS. Supportive care. No new GI recs. Await TIPS. Will follow from a distance. Call us back if needed.    LOS: 2 days   Shirley Friar  02/25/2022, 1:42 PM  Questions please call [phone]

## 2022-02-26 ENCOUNTER — Encounter (HOSPITAL_COMMUNITY): Payer: Self-pay | Admitting: Interventional Radiology

## 2022-02-26 DIAGNOSIS — Z515 Encounter for palliative care: Secondary | ICD-10-CM | POA: Diagnosis not present

## 2022-02-26 DIAGNOSIS — Z66 Do not resuscitate: Secondary | ICD-10-CM | POA: Diagnosis not present

## 2022-02-26 DIAGNOSIS — K3189 Other diseases of stomach and duodenum: Secondary | ICD-10-CM

## 2022-02-26 DIAGNOSIS — I951 Orthostatic hypotension: Secondary | ICD-10-CM | POA: Diagnosis not present

## 2022-02-26 DIAGNOSIS — Z7189 Other specified counseling: Secondary | ICD-10-CM | POA: Diagnosis not present

## 2022-02-26 DIAGNOSIS — K7031 Alcoholic cirrhosis of liver with ascites: Secondary | ICD-10-CM | POA: Diagnosis not present

## 2022-02-26 DIAGNOSIS — K766 Portal hypertension: Secondary | ICD-10-CM

## 2022-02-26 DIAGNOSIS — I480 Paroxysmal atrial fibrillation: Secondary | ICD-10-CM | POA: Diagnosis not present

## 2022-02-26 LAB — COMPREHENSIVE METABOLIC PANEL
ALT: 79 U/L — ABNORMAL HIGH (ref 0–44)
AST: 210 U/L — ABNORMAL HIGH (ref 15–41)
Albumin: 2.8 g/dL — ABNORMAL LOW (ref 3.5–5.0)
Alkaline Phosphatase: 102 U/L (ref 38–126)
Anion gap: 10 (ref 5–15)
BUN: 37 mg/dL — ABNORMAL HIGH (ref 8–23)
CO2: 18 mmol/L — ABNORMAL LOW (ref 22–32)
Calcium: 8.1 mg/dL — ABNORMAL LOW (ref 8.9–10.3)
Chloride: 102 mmol/L (ref 98–111)
Creatinine, Ser: 2.37 mg/dL — ABNORMAL HIGH (ref 0.44–1.00)
GFR, Estimated: 22 mL/min — ABNORMAL LOW (ref >60)
Glucose, Bld: 98 mg/dL (ref 70–99)
Potassium: 4.3 mmol/L (ref 3.5–5.1)
Sodium: 130 mmol/L — ABNORMAL LOW (ref 135–145)
Total Bilirubin: 1.6 mg/dL — ABNORMAL HIGH (ref 0.3–1.2)
Total Protein: 5.6 g/dL — ABNORMAL LOW (ref 6.5–8.1)

## 2022-02-26 LAB — CBC WITH DIFFERENTIAL/PLATELET
Abs Immature Granulocytes: 0.19 10*3/uL — ABNORMAL HIGH (ref 0.00–0.07)
Basophils Absolute: 0 10*3/uL (ref 0.0–0.1)
Basophils Relative: 0 %
Eosinophils Absolute: 0.1 10*3/uL (ref 0.0–0.5)
Eosinophils Relative: 1 %
HCT: 28.7 % — ABNORMAL LOW (ref 36.0–46.0)
Hemoglobin: 9.2 g/dL — ABNORMAL LOW (ref 12.0–15.0)
Immature Granulocytes: 1 %
Lymphocytes Relative: 6 %
Lymphs Abs: 1.1 10*3/uL (ref 0.7–4.0)
MCH: 26 pg (ref 26.0–34.0)
MCHC: 32.1 g/dL (ref 30.0–36.0)
MCV: 81.1 fL (ref 80.0–100.0)
Monocytes Absolute: 1.1 10*3/uL — ABNORMAL HIGH (ref 0.1–1.0)
Monocytes Relative: 6 %
Neutro Abs: 14.8 10*3/uL — ABNORMAL HIGH (ref 1.7–7.7)
Neutrophils Relative %: 86 %
Platelets: 160 10*3/uL (ref 150–400)
RBC: 3.54 MIL/uL — ABNORMAL LOW (ref 3.87–5.11)
RDW: 20.8 % — ABNORMAL HIGH (ref 11.5–15.5)
WBC: 17.3 10*3/uL — ABNORMAL HIGH (ref 4.0–10.5)
nRBC: 0.1 % (ref 0.0–0.2)

## 2022-02-26 MED ORDER — BOOST / RESOURCE BREEZE PO LIQD CUSTOM
1.0000 | Freq: Three times a day (TID) | ORAL | Status: DC
  Administered 2022-02-26 – 2022-03-07 (×27): 1 via ORAL

## 2022-02-26 MED ORDER — PROSOURCE PLUS PO LIQD
30.0000 mL | Freq: Two times a day (BID) | ORAL | Status: DC
  Administered 2022-02-27 – 2022-03-07 (×15): 30 mL via ORAL
  Filled 2022-02-26 (×15): qty 30

## 2022-02-26 MED ORDER — ALBUMIN HUMAN 25 % IV SOLN
50.0000 g | Freq: Every day | INTRAVENOUS | Status: AC
  Administered 2022-02-26 – 2022-02-28 (×3): 50 g via INTRAVENOUS
  Filled 2022-02-26 (×3): qty 200

## 2022-02-26 NOTE — Consult Note (Addendum)
[facility] ?Nephrology Consultation Note ? ?Requesting MD: Barb Merino, MD ?Reason for consult: AKI on CKD IIIa, hepatorenal syndrome ? ?HPI:  ?Diane Pruitt is a 71 y.o. female with history significant for CKD stage 3A,  alcoholic liver cirrhosis with multiple complications including upper GI bleeding, portal hypertensive gastropathy and recurrent ascites, paroxysmal a-fib, HTN, depression, and prior ischemic stroke. ? ?Patient initially presented to [facility] on 4/25 with abdominal distention and worsening ascites as well as poor oral intake and feeling generally unwell. ?She was admitted to [facility] with AKI on CKD concerning for possible hepatorenal syndrome. However she was also found to have C. Diff positive diarrhea on admission to [facility]. ? ?Patient was transferred to [facility] on 4/28 for TIPS procedure which was attempted on 4/29 but was unsuccessful due to vascular challenges.  ? ?She was seen by nephrology while admitted to [facility]. Renal ultrasound was negative for obstruction. Received IV albumin without significant improvement in renal function.  ? ?Creatinine, Ser  ?Date/Time Value Ref Range Status  ?02/26/2022 09:26 AM 2.37 (H) 0.44 - 1.00 mg/dL Final  ?02/25/2022 08:54 AM 2.05 (H) 0.44 - 1.00 mg/dL Final  ?02/24/2022 12:26 AM 2.07 (H) 0.44 - 1.00 mg/dL Final  ?02/23/2022 04:46 AM 2.25 (H) 0.44 - 1.00 mg/dL Final  ?02/22/2022 03:36 AM 2.35 (H) 0.44 - 1.00 mg/dL Final  ?02/21/2022 03:42 AM 2.57 (H) 0.44 - 1.00 mg/dL Final  ?02/20/2022 02:08 PM 2.66 (H) 0.44 - 1.00 mg/dL Final  ?02/12/2022 12:02 AM 1.69 (H) 0.44 - 1.00 mg/dL Final  ?02/01/2022 03:13 AM 1.49 (H) 0.44 - 1.00 mg/dL Final  ?01/31/2022 01:06 AM 1.58 (H) 0.44 - 1.00 mg/dL Final  ?01/30/2022 01:18 AM 1.62 (H) 0.44 - 1.00 mg/dL Final  ?01/29/2022 03:50 AM 1.76 (H) 0.44 - 1.00 mg/dL Final  ?01/25/2022 04:27 AM 1.45 (H) 0.44 - 1.00 mg/dL Final  ?01/24/2022 03:02 AM 1.56 (H) 0.44 - 1.00 mg/dL Final  ?01/23/2022 06:35 AM 1.53 (H) 0.44 -  1.00 mg/dL Final  ?01/22/2022 12:58 AM 1.70 (H) 0.44 - 1.00 mg/dL Final  ?01/21/2022 03:25 PM 1.68 (H) 0.44 - 1.00 mg/dL Final  ?01/06/2022 07:04 AM 1.30 (H) 0.44 - 1.00 mg/dL Final  ?01/05/2022 05:17 AM 1.26 (H) 0.44 - 1.00 mg/dL Final  ?01/04/2022 07:32 AM 1.06 (H) 0.44 - 1.00 mg/dL Final  ?01/03/2022 01:55 PM 1.32 (H) 0.44 - 1.00 mg/dL Final  ?02/15/2020 11:44 AM 0.92 0.44 - 1.00 mg/dL Final  ?01/27/2018 04:18 AM 0.90 0.44 - 1.00 mg/dL Final  ?01/26/2018 03:12 AM 0.99 0.44 - 1.00 mg/dL Final  ?01/25/2018 07:38 PM 1.03 (H) 0.44 - 1.00 mg/dL Final  ? ? ? ?PMHx: ?Past Medical History:  ?Diagnosis Date  ? Atrial fibrillation ([place])   ? Barrett esophagus   ? Chronic kidney disease (CKD), stage III (moderate) (HCC)   ? Cirrhosis ([place])   ? Dysrhythmia   ? Atrial Fibrillation 2 years ago  ? Hypertension   ? Ischemic stroke ([place]) 01/25/2018  ? Stroke [facility])   ? ? ?Past Surgical History:  ?Procedure Laterality Date  ? CESAREAN SECTION    ? ESOPHAGOGASTRODUODENOSCOPY (EGD) WITH PROPOFOL N/A 01/05/2022  ? Procedure: ESOPHAGOGASTRODUODENOSCOPY (EGD) WITH PROPOFOL;  Surgeon: Ronnette Juniper, MD;  Location: [facility];  Service: Gastroenterology;  Laterality: N/A;  ? EYE SURGERY    ? INTRAOCULAR LENS INSERTION Bilateral About 3 years ago  ? IR PARACENTESIS  02/12/2022  ? IR PARACENTESIS  02/24/2022  ? IR TIPS  02/24/2022  ? IR US GUIDE VASC ACCESS  RIGHT  02/24/2022  ? KYPHOPLASTY N/A 02/18/2020  ? Procedure: KYPHOPLASTY L2;  Surgeon: Melina Schools, MD;  Location:  [facility];  Service: Orthopedics;  Laterality: N/A;  60 mins  ? TONSILLECTOMY    ? TUBAL LIGATION    ? ? ?Family Hx:  ?Family History  ?Problem Relation Age of Onset  ? Hypertension Mother   ? Hyperlipidemia Mother   ? Atrial fibrillation Mother   ? Hyperlipidemia Father   ? Atrial fibrillation Father   ? ? ?Social History:  reports that she has never smoked. She has never used smokeless tobacco. She reports that she does not drink alcohol and does not use drugs. ? ?Allergies:   ?Allergies  ?Allergen Reactions  ? Colchicine Other (See Comments)  ?  Stomach upset, diarrhea ?Other reaction(s): stomach upset  ? ? ?Medications: ?Prior to Admission medications   ?Medication Sig Start Date End Date Taking? Authorizing Provider  ?acetaminophen (TYLENOL) 500 MG tablet Take 1,000 mg by mouth every 6 (six) hours as needed for headache or mild pain.   Yes [provider]  ?alendronate (FOSAMAX) 70 MG tablet Take 70 mg by mouth every Monday.   Yes [provider]  ?allopurinol (ZYLOPRIM) 100 MG tablet Take 200 mg by mouth daily. 01/20/20  Yes [provider]  ?amiodarone (PACERONE) 200 MG tablet Take 200 mg by mouth in the morning and at bedtime.  02/27/18  Yes [provider]  ?Cholecalciferol (VITAMIN D) 50 MCG (2000 UT) tablet Take 2,000 Units by mouth daily. 05/03/21  Yes [provider]  ?furosemide (LASIX) 20 MG tablet Take 20 mg by mouth daily. 02/04/22  Yes [provider]  ?iron polysaccharides (NIFEREX) 150 MG capsule Take 1 capsule (150 mg total) by mouth daily. 01/27/22  Yes Kathie Dike, MD  ?levothyroxine (SYNTHROID) 200 MCG tablet Take 200 mcg by mouth every morning. 12/26/21  Yes [provider]  ?midodrine (PROAMATINE) 10 MG tablet Take 1 tablet (10 mg total) by mouth 3 (three) times daily with meals. 02/01/22  Yes Barb Merino, MD  ?Multiple Vitamins-Minerals (PRESERVISION AREDS 2 PO) Take 1 capsule by mouth daily. Preservision AREDS-2 250 mg- 90 mg- 40 mg-1 mg   Yes [provider]  ?pantoprazole (PROTONIX) 40 MG tablet Take 1 tablet (40 mg total) by mouth 2 (two) times daily before a meal. 01/26/22 02/25/22 Yes Memon, Jolaine Artist, MD  ?polyethylene glycol (MIRALAX / GLYCOLAX) 17 g packet Take 17 g by mouth daily as needed for mild constipation. 01/26/22  Yes Kathie Dike, MD  ?spironolactone (ALDACTONE) 50 MG tablet Take 50 mg by mouth daily. 02/04/22  Yes [provider]  ? ? I have reviewed the patient's current  medications. ? ?Labs:  ?Results for orders placed or performed during the hospital encounter of 02/23/22 (from the past 48 hour(s))  ?I-STAT 7, (LYTES, BLD GAS, ICA, H+H)     Status: Abnormal  ? Collection Time: 02/24/22  1:25 PM  ?Result Value Ref Range  ? pH, Arterial 7.270 (L) 7.35 - 7.45  ? pCO2 arterial 43.8 32 - 48 mmHg  ? pO2, Arterial 72 (L) 83 - 108 mmHg  ? Bicarbonate 20.2 20.0 - 28.0 mmol/L  ? TCO2 21 (L) 22 - 32 mmol/L  ? O2 Saturation 92 %  ? Acid-base deficit 6.0 (H) 0.0 - 2.0 mmol/L  ? Sodium 136 135 - 145 mmol/L  ? Potassium 3.0 (L) 3.5 - 5.1 mmol/L  ? Calcium, Ion 1.16 1.15 - 1.40 mmol/L  ? HCT 31.0 (  L) 36.0 - 46.0 %  ? Hemoglobin 10.5 (L) 12.0 - 15.0 g/dL  ? Sample type ARTERIAL   ?I-STAT 7, (LYTES, BLD GAS, ICA, H+H)     Status: Abnormal  ? Collection Time: 02/24/22  3:37 PM  ?Result Value Ref Range  ? pH, Arterial 7.370 7.35 - 7.45  ? pCO2 arterial 31.8 (L) 32 - 48 mmHg  ? pO2, Arterial 61 (L) 83 - 108 mmHg  ? Bicarbonate 18.3 (L) 20.0 - 28.0 mmol/L  ? TCO2 19 (L) 22 - 32 mmol/L  ? O2 Saturation 91 %  ? Acid-base deficit 6.0 (H) 0.0 - 2.0 mmol/L  ? Sodium 136 135 - 145 mmol/L  ? Potassium 2.9 (L) 3.5 - 5.1 mmol/L  ? Calcium, Ion 1.10 (L) 1.15 - 1.40 mmol/L  ? HCT 27.0 (L) 36.0 - 46.0 %  ? Hemoglobin 9.2 (L) 12.0 - 15.0 g/dL  ? Sample type ARTERIAL   ?Comprehensive metabolic panel     Status: Abnormal  ? Collection Time: 02/25/22  8:54 AM  ?Result Value Ref Range  ? Sodium 133 (L) 135 - 145 mmol/L  ? Potassium 3.9 3.5 - 5.1 mmol/L  ?  Comment: DELTA CHECK NOTED  ? Chloride 105 98 - 111 mmol/L  ? CO2 18 (L) 22 - 32 mmol/L  ? Glucose, Bld 86 70 - 99 mg/dL  ?  Comment: Glucose reference range applies only to samples taken after fasting for at least 8 hours.  ? BUN 32 (H) 8 - 23 mg/dL  ? Creatinine, Ser 2.05 (H) 0.44 - 1.00 mg/dL  ? Calcium 7.9 (L) 8.9 - 10.3 mg/dL  ? Total Protein 5.4 (L) 6.5 - 8.1 g/dL  ? Albumin 3.0 (L) 3.5 - 5.0 g/dL  ? AST 219 (H) 15 - 41 U/L  ? ALT 70 (H) 0 - 44 U/L  ? Alkaline  Phosphatase 98 38 - 126 U/L  ? Total Bilirubin 1.5 (H) 0.3 - 1.2 mg/dL  ? GFR, Estimated 26 (L) >60 mL/min  ?  Comment: (NOTE) ?Calculated using the CKD-EPI Creatinine Equation (2021) ?  ? Anion gap 10 5

## 2022-02-26 NOTE — Progress Notes (Signed)
Nutrition Follow-up ? ?DOCUMENTATION CODES:  ?Not applicable ? ?INTERVENTION:  ?Continue current diet as ordered. Encourage PO intake ?Boost Breeze po TID, each supplement provides 250 kcal and 9 grams of protein  ?30 ml Prosource Plus TID, each supplement provides 100 kcals and 15 grams protein ?MVI with minerals daily ? ?NUTRITION DIAGNOSIS:  ?Increased nutrient needs related to chronic illness (cirrhosis) as evidenced by estimated needs. ? ?GOAL:  ?Patient will meet greater than or equal to 90% of their needs ? ?MONITOR:  ?PO intake, Supplement acceptance, I & O's, Labs ? ?REASON FOR ASSESSMENT:  ?Consult ?Assessment of nutrition requirement/status ? ?ASSESSMENT:  ?71 y.o. female with history of HTN, HLD, atrial fibrillation, hx of ischemic stroke, CKD3, alcoholic liver cirrhosis, and barrett's esophagus presented to [facility] from SNF for worsening ascites. Transferred to [facility] to undergo TIPS procedure. ? ?Recently admitted to [facility] 4/3-4/6 ? ?Pt was followed by RD at [facility] prior to transfer to [facility] with last assessment on 4/26. Pt reported at that she has had a decreased appetite over the past month due to not liking the food at her SNF and early satiety secondary to ascites.  ? ?On admission to [facility] pt was found with AKI consistent with hepatorenal syndrome and to be positive for C. Difficile. ? ?4/26 - paracentesis, 6.6L of clear yellow fluid removed ?4/28 - transferred to [facility] ?4/29 - TIPS attempted in radiology but was unsuccessful due to pt's anatomy. ? ?Will continue previously planned nutrition interventions and monitor for pt's acceptance. Noted that pt doe not like ensure shakes as they caused diarrhea during previous admission. ? ?Noted a 10.6% weight loss over the last year. ? ? ?Intake/Output Summary (Last 24 hours) at 02/26/2022 2037 ?Last data filed at 02/26/2022 1340 ?Gross per 24 hour  ?Intake 220 ml  ?Output 400 ml  ?Net -180 ml  ?Net IO Since Admission: -35 mL [02/26/22 2037]   ? ?Average Meal Intake: ?4/27-5/1: 25% intake x 5 recorded meals ? ?Nutritionally Relevant Medications: ?Scheduled Meds: ? multivitamin with minerals  1 tablet Oral Daily  ? pantoprazole  40 mg Oral BID  ? vancomycin  125 mg Oral QID  ? ?Continuous Infusions: ? albumin human 50 g (02/26/22 1431)  ? ?PRN Meds: ondansetron ? ?Labs Reviewed: ?Sodium 130 ?BUN 37, creatinine 2.37 ? ?NUTRITION - FOCUSED PHYSICAL EXAM: ?Defer to in-person assessment ? ?Diet Order:   ?Diet Order   ? ?       ?  Diet regular Room service appropriate? Yes; Fluid consistency: Thin  Diet effective now       ?  ? ?  ?  ? ?  ? ? ?EDUCATION NEEDS:  ?No education needs have been identified at this time ? ?Skin:  Skin Assessment: Reviewed RN Assessment ? ?Last BM:  4/28 - type 6 ? ?Height:  ?Ht Readings from Last 1 Encounters:  ?02/23/22 '5\' 2"'$  (1.575 m)  ? ? ?Weight:  ?Wt Readings from Last 1 Encounters:  ?02/23/22 77 kg  ? ? ?Ideal Body Weight:  50 kg ? ?BMI:  Body mass index is 31.05 kg/m?. ? ?Estimated Nutritional Needs:  ?Kcal:  1700-1900 kcal/d ?Protein:  90-100 g/d ?Fluid:  1.8-2L/d ? ? ? ?Ranell Patrick, RD, LDN ?Clinical Dietitian ?RD pager # available in [place]  ?After hours/weekend pager # available in [place] ?

## 2022-02-26 NOTE — Progress Notes (Signed)
?Daily Progress Note  ? ?Patient Name: Diane Pruitt       Date: 02/26/2022 ?DOB: 12-14-1950  Age: 71 y.o. MRN#: [id number] ?Attending Physician: Barb Merino, MD ?Primary Care Physician: [name], MD ?Admit Date: 02/23/2022 ?Length of Stay: 3 days ? ?Reason for Consultation/Follow-up: Establishing goals of care ? ?HPI/Patient Profile:  Palliative Care consult requested for goals of care discussion in this 71 y.o. female  with past medical history of atrial fibrillation ([place]), CKD 3, depression, ischemic stroke, alcoholic cirrhosis, and hypertension. She was admitted initially on 4/25 at [facility] due to weakness and recurrent ascites. Transferred to [facility] on 02/23/2022 for IR TIPS procedure. Recent hospitalization where she was treated for upper GI bleed and discharged on 02/01/22 to SNF. ? ?Subjective:  ? ?Subjective: ?Chart Reviewed. Updates received. Patient Assessed. Created space and opportunity for patient  and family to explore thoughts and feelings regarding current medical situation. ? ?Today's Discussion: Today I met with the patient at the bedside.  We had a discussion about her current clinical status including cirrhosis, the complications of cirrhosis that can cause problems including hepatorenal which she seems to be waiting into with increased creatinine.  We discussed nephrology on board to make recommendations.  Confirmed DNR status, desire for no tube feed.  We discussed the current plan to go to SNF/rehab to see if she can get any better.  She has some concerns about this as she had a poor experience at her previous facility at [facility] in [place].  She states PT and OT were coming to see her but she was too sick to participate and, in her mind, had minimal to no medical treatment to allow her to improve and participate.  While she is agreeable to go to SNF/rehab she would prefer a different facility. ? ?We also had a discussion about if her disease continues to progress/continued  decline and we may need to have a discussion about hospice care.  However, she is not quite at that point just yet where she would like to consider this. ? ?Today she states she is feeling okay, some abdominal swelling but not bad.  She did have a bowel movement in the past 24 hours but did not notice any melena or hematochezia.  Her breathing is okay and not having pain today. ? ?Based on previous discussions we decided to complete a MOST form today.  Details about the MOST form are listed below.  Overall, limited scope of treatment. ? ?We also discussed possible completion of TIPS procedure in a couple weeks, after she is able to get stronger at rehab (based on hospitalist note and their discussion with interventional radiology and recommendations). ? ?I provided emotional general support through therapeutic listening, empathy, sharing of stories, and other techniques.  I answered all questions and addressed all concerns to the best my ability. ? ? ?I completed a MOST form today. The patient and family outlined their wishes for the following treatment decisions: ? ?Cardiopulmonary Resuscitation: Do Not Attempt Resuscitation (DNR/No CPR)  ?Medical Interventions: Limited Additional Interventions: Use medical treatment, IV fluids and cardiac monitoring as indicated, DO NOT USE intubation or mechanical ventilation. May consider use of less invasive airway support such as BiPAP or CPAP. Also provide comfort measures. Transfer to the hospital if indicated. Avoid intensive care.   ?Antibiotics: Antibiotics if indicated  ?IV Fluids: IV fluids for a defined trial period  ?Feeding Tube: No feeding tube  ? ? ? ?Review of Systems  ?Constitutional:  Positive  for fatigue.  ?     Feels ok overall  ?Gastrointestinal:  Positive for abdominal distention (minimal/mild). Negative for abdominal pain, blood in stool, diarrhea, nausea and vomiting.  ? ?Objective:  ? ?Vital Signs:  ?BP (!) 96/49 (BP Location: Left Arm)   Pulse 62    Temp 97.8 ?F (36.6 ?C) (Oral)   Resp 18   Ht 5' 2" (1.575 m)   Wt 77 kg   SpO2 98%   BMI 31.05 kg/m?  ? ?Physical Exam: ?Physical Exam ?Vitals and nursing note reviewed.  ?Constitutional:   ?   Appearance: Normal appearance.  ?HENT:  ?   Head: Normocephalic and atraumatic.  ?Cardiovascular:  ?   Rate and Rhythm: Normal rate.  ?Pulmonary:  ?   Effort: Pulmonary effort is normal. No respiratory distress.  ?Abdominal:  ?   General: There is distension (Mild).  ?   Comments: Abdomen firm but not tense  ?Skin: ?   General: Skin is warm and dry.  ?Neurological:  ?   General: No focal deficit present.  ?   Mental Status: She is alert.  ?Psychiatric:     ?   Mood and Affect: Mood normal.     ?   Behavior: Behavior normal.  ? ? ?Palliative Assessment/Data: 40-50% ? ? ?Assessment & Plan:  ? ?Impression: ?Present on Admission: ? 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 Acute blood loss anemia ? Orthostatic hypotension with weakness and falls  ? AKI (acute kidney injury) ([place]) ? Acquired hypothyroidism ? Depression ? Paroxysmal atrial fibrillation (HCC) ? Clostridium difficile infection ? ?71 year old female with multiple chronic comorbidities including alcoholic cirrhosis, GI bleed/ascites, increased weakness, frailty, failed TIPS procedure due to complex vascular anatomy.  No recurrent bleeding at this time.  She understands plan for discharge to skilled nursing facility, requested a different facility than her previous one.  Discussed plans for possible repeat of TIPS procedure attempt a couple weeks after strengthening.  Long-term prognosis is poor.  Unsure how much she would be able to tolerate in the form of repeated paracentesis.  Her MELD score today appears to be about 26.  I do not feel she would be a transplant candidate, although this is not an official opinion.  Nephrology has been consulted for possible hepatorenal syndrome.  MOST form completed and scanned into the system (ACP/Vynca  Tab). ? ?SUMMARY OF RECOMMENDATIONS   ?Remain DNR ?No feeding tube, limited trial of IV fluids if needed, okay with antibiotics, DNR (see MOST form scanned into system) ?Continue to treat the treatable otherwise ?Plan for discharge to skilled nursing facility/rehab ?She would benefit from outpatient palliative follow-up as well ?Goals appear clear at this time ?PMT will follow peripherally ?Please contact us if we can be of further assistance ? ?Symptom Management:  ?Per primary team ?PMT is available to assist as needed ? ?Code Status: DNR ? ?Prognosis: Unable to determine ? ?Discharge Planning: [facility] for rehab with Palliative care service follow-up ? ?Discussed with: Medical team, nursing team, patient, TOC ? ?Thank you for allowing [place] to participate in the care of Ginamarie Banfield ?PMT will continue to support holistically. ? ?Time Total: 90 min ? ?Visit consisted of counseling and education dealing with the complex and emotionally intense issues of symptom management and palliative care in the setting of serious and potentially life-threatening illness. Greater than 50%  of this time was spent counseling and coordinating care related to the above assessment and plan. ? ?  Walden Field, NP ?Palliative Medicine Team ? ?Team Phone # [phone] (Nights/Weekends) ? 06/27/2021, 8:17 AM  ? ?

## 2022-02-26 NOTE — Plan of Care (Signed)
  Problem: Activity: Goal: Risk for activity intolerance will decrease Outcome: Progressing   Problem: Nutrition: Goal: Adequate nutrition will be maintained Outcome: Progressing   Problem: Pain Managment: Goal: General experience of comfort will improve Outcome: Progressing   Problem: Safety: Goal: Ability to remain free from injury will improve Outcome: Progressing   

## 2022-02-26 NOTE — Progress Notes (Signed)
?PROGRESS NOTE ? ? ? ?Diane Pruitt  PZW:[id number] DOB: 1950-12-10 DOA: 02/23/2022 ?PCP: [name], MD  ? ? ?Brief Narrative:  ?Diane Pruitt is a 71 y.o. 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   ?Patient was recently admitted to [facility] 4/3 -4/6 for upper GI bleeding, portal hypertensive gastropathy, ascites and was discharged to a skilled nursing facility near [place].  Patient tells me she felt well for a few days, however since then she felt poorly, poor appetite, weakness, dizziness and abdominal distention and diarrhea.   ?On admission to [place] she was found with AKI consistent with hepatorenal syndrome, C. difficile positive diarrhea. ?Paracentesis 4/26 ?IR consulted, noted portal vein thrombosis.  After careful discussion, decided to undergo TIPS procedure hence transferred to [facility]. ?Attempted TIPS and unable to do it.  Remains debilitated. ? ?Assessment & Plan: ?  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 Recurrent ascites.  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 ?Patient was taken to operating room for TIPS procedure, however could not be navigated because of difficult vascular anatomy. ?Unable to tolerate diuretics, will hold off on any diuretics.  1 dose of Lasix given 4/29 night for fluid overload. ?She completed 3 days of Rocephin and discontinued as no evidence of SBP. ?Paracentesis 4/25, transudate. ?Paracentesis 4/29, transudate. ?Patient has history of hypertensive gastropathy and recent GI bleeding, on PPI.  We will continue oral PPI. ?Bilirubin is normal.  Transaminases are mildly elevated but is  stable. ?GI will follow-up. ?Severe orthostatic hypotension, some response with increasing dose of midodrine 15 mg 3 times daily. ? ?Acute kidney injury with history of chronic kidney disease stage IIIa: ?Previous baseline creatinine 1.3.  Continue to have elevated creatinine, seen by nephrology at [facility].  Treated with bicarbonate infusion.  Creatinine 2.05 which is at about her baseline.  Due to fluid overload, discontinued further isotonic fluid.  Renal ultrasound without obstruction.  Continue intake and output monitoring. ?We will discuss with nephrology about optimizing therapies, further treatments. ? ?C. difficile diarrhea: Diagnosed 4/25.  Continue vancomycin 125 mg 4 times daily for 10 days.  Diarrhea mostly improved. ?  ?Paroxysmal A-fib: Patient is sinus rhythm today.  Unable to anticoagulate due to ongoing bleeding.  Statin unable to be used due to abnormal liver functions. ?  ?Hypothyroidism: On Synthroid.  Continue.  TSH normal. ? ?Hypomagnesemia: Aggressively replaced with improvement. ? ?Work with PT and OT.  Referred to a skilled nursing facility.  Discussed with radiology at the bedside, they would postpone procedure for patient to recover into rehab. ? ? ? ?DVT prophylaxis: SCDs Start: 02/23/22 1736 ? ? ?Code Status: DNR ?Family Communication: Daughter on the phone 4/30 ?Disposition Plan: Status is: Inpatient ?Remains inpatient appropriate because: Inpatient procedures ?  ? ? ?Consultants:  ?Interventional radiology ?Gastroenterology ?Nephrology ? ?Procedures:  ?Paracentesis 4/25 ?Paracentesis 4/29, 3.7 L removed. ? ?Antimicrobials:  ?None.  Completed Rocephin. ? ? ?Subjective: ? ?Patient seen and examined.  Denies any complaints.  She tells me that she was able to go to bedside commode and stand up without getting dizziness last night.  Very anxious.  She feels like she will not get any rehab at the skilled nursing facility.  She wants  to see if they can do TIPS procedure before  discharge. ?I will communicate with interventional radiology today if they can do the procedure before discharge next few days. ?We will discuss with nephrology about ongoing abnormal kidney functions. ? ?Objective: ?Vitals:  ? 02/25/22 1430 02/25/22 2019 02/26/22 0424 02/26/22 0752  ?BP: 104/64 (!) 98/52 (!) 108/55 (!) 96/49  ?Pulse: 67 64 65 62  ?Resp: '20 20 18 18  '$ ?Temp: 98.3 ?F (36.8 ?C) 98.4 ?F (36.9 ?C) 97.8 ?F (36.6 ?C) 97.8 ?F (36.6 ?C)  ?TempSrc: Oral Oral Oral Oral  ?SpO2: 93% 98% 100% 98%  ?Weight:      ?Height:      ? ? ?Intake/Output Summary (Last 24 hours) at 02/26/2022 1106 ?Last data filed at 02/25/2022 2100 ?Gross per 24 hour  ?Intake 240 ml  ?Output 400 ml  ?Net -160 ml  ? ?Filed Weights  ? 02/23/22 1818  ?Weight: 77 kg  ? ? ?Examination: ? ?General: Pale.  Debilitated and frail.  Not in distress.  Anxious and flat affect. ?Cardiovascular: S1-S2 normal.  Regular rate rhythm.  Heart rate controlled. ?Respiratory: Bilateral clear.  On 2 L oxygen. ?Gastrointestinal: Soft.  Nontender.  Bowel sound present.   ?Ext: No edema or swelling.  No cyanosis.  No deformities. ?Neuro: Intact. ? ? ? ? ? ?Data Reviewed: I have personally reviewed following labs and imaging studies ? ?CBC: ?Recent Labs  ?Lab 02/20/22 ?1408 02/21/22 ?0342 02/22/22 ?0336 02/23/22 ?0446 02/24/22 ?0026 02/24/22 ?1325 02/24/22 ?1537 02/25/22 ?0086 02/26/22 ?7619  ?WBC 18.1*   < > 17.3* 17.9* 17.4*  --   --  15.8* 17.3*  ?NEUTROABS 15.6*  --   --   --   --   --   --  13.5* 14.8*  ?HGB 10.6*   < > 9.1* 9.1* 9.1* 10.5* 9.2* 9.0* 9.2*  ?HCT 33.8*   < > 28.5* 28.7* 28.0* 31.0* 27.0* 27.5* 28.7*  ?MCV 81.4   < > 80.7 82.0 81.2  --   --  81.4 81.1  ?PLT 305   < > 211 191 173  --   --  134* 160  ? < > = values in this interval not displayed.  ? ?Basic Metabolic Panel: ?Recent Labs  ?Lab 02/22/22 ?0336 02/23/22 ?0446 02/24/22 ?0026 02/24/22 ?1325 02/24/22 ?1537 02/25/22 ?5093 02/26/22 ?2671  ?NA 134* 133* 132* 136 136 133* 130*  ?K 4.3 3.5 3.1* 3.0*  2.9* 3.9 4.3  ?CL 108 107 106  --   --  105 102  ?CO2 19* 17* 17*  --   --  18* 18*  ?GLUCOSE 76 93 95  --   --  86 98  ?BUN 41* 38* 33*  --   --  32* 37*  ?CREATININE 2.35* 2.25* 2.07*  --   --  2.05* 2.37*  ?CALCIUM 8.0* 7.8* 7.9*  --   --  7.9* 8.1*  ?MG  --   --   --   --   --  1.5*  --   ? ?GFR: ?Estimated Creatinine Clearance: 21.2 mL/min (A) (by C-G formula based on SCr of 2.37 mg/dL (H)). ?Liver Function Tests: ?Recent Labs  ?Lab 02/22/22 ?2458 02/23/22 ?0998 02/24/22 ?0026 02/25/22 ?3382 02/26/22 ?5053  ?AST 172* 157* 154* 219* 210*  ?ALT 71* 66* 63* 70* 79*  ?ALKPHOS 106 100 101 98 102  ?BILITOT 0.9 1.0 1.0 1.5* 1.6*  ?PROT 5.8* 5.9* 5.7* 5.4* 5.6*  ?ALBUMIN 2.5* 2.9* 2.9* 3.0* 2.8*  ? ?Recent  Labs  ?Lab 02/20/22 ?1408  ?LIPASE 43  ? ?No results for input(s): AMMONIA in the last 168 hours. ?Coagulation Profile: ?Recent Labs  ?Lab 02/20/22 ?1408 02/23/22 ?6294 02/24/22 ?0026  ?INR 1.3* 1.5* 1.5*  ? ?Cardiac Enzymes: ?No results for input(s): CKTOTAL, CKMB, CKMBINDEX, TROPONINI in the last 168 hours. ?BNP (last 3 results) ?No results for input(s): PROBNP in the last 8760 hours. ?HbA1C: ?No results for input(s): HGBA1C in the last 72 hours. ?CBG: ?No results for input(s): GLUCAP in the last 168 hours. ?Lipid Profile: ?No results for input(s): CHOL, HDL, LDLCALC, TRIG, CHOLHDL, LDLDIRECT in the last 72 hours. ?Thyroid Function Tests: ?No results for input(s): TSH, T4TOTAL, FREET4, T3FREE, THYROIDAB in the last 72 hours. ?Anemia Panel: ?No results for input(s): VITAMINB12, FOLATE, FERRITIN, TIBC, IRON, RETICCTPCT in the last 72 hours. ?Sepsis Labs: ?No results for input(s): PROCALCITON, LATICACIDVEN in the last 168 hours. ? ?Recent Results (from the past 240 hour(s))  ?Resp Panel by RT-PCR (Flu A&B, Covid) Nasopharyngeal Swab     Status: None  ? Collection Time: 02/20/22  3:03 PM  ? Specimen: Nasopharyngeal Swab; Nasopharyngeal(NP) swabs in vial transport medium  ?Result Value Ref Range Status  ? SARS  Coronavirus 2 by RT PCR NEGATIVE NEGATIVE Final  ?  Comment: (NOTE) ?SARS-CoV-2 target nucleic acids are NOT DETECTED. ? ?The SARS-CoV-2 RNA is generally detectable in upper respiratory ?specimens during the acute

## 2022-02-27 LAB — COMPREHENSIVE METABOLIC PANEL
ALT: 69 U/L — ABNORMAL HIGH (ref 0–44)
AST: 185 U/L — ABNORMAL HIGH (ref 15–41)
Albumin: 3 g/dL — ABNORMAL LOW (ref 3.5–5.0)
Alkaline Phosphatase: 118 U/L (ref 38–126)
Anion gap: 9 (ref 5–15)
BUN: 41 mg/dL — ABNORMAL HIGH (ref 8–23)
CO2: 17 mmol/L — ABNORMAL LOW (ref 22–32)
Calcium: 8.5 mg/dL — ABNORMAL LOW (ref 8.9–10.3)
Chloride: 104 mmol/L (ref 98–111)
Creatinine, Ser: 2.31 mg/dL — ABNORMAL HIGH (ref 0.44–1.00)
GFR, Estimated: 22 mL/min — ABNORMAL LOW (ref >60)
Glucose, Bld: 87 mg/dL (ref 70–99)
Potassium: 4.7 mmol/L (ref 3.5–5.1)
Sodium: 130 mmol/L — ABNORMAL LOW (ref 135–145)
Total Bilirubin: 1.6 mg/dL — ABNORMAL HIGH (ref 0.3–1.2)
Total Protein: 5.5 g/dL — ABNORMAL LOW (ref 6.5–8.1)

## 2022-02-27 LAB — URINALYSIS, COMPLETE (UACMP) WITH MICROSCOPIC
Bilirubin Urine: NEGATIVE
Glucose, UA: NEGATIVE mg/dL
Hgb urine dipstick: NEGATIVE
Ketones, ur: NEGATIVE mg/dL
Leukocytes,Ua: NEGATIVE
Nitrite: NEGATIVE
Protein, ur: NEGATIVE mg/dL
Specific Gravity, Urine: 1.023 (ref 1.005–1.030)
pH: 5 (ref 5.0–8.0)

## 2022-02-27 LAB — CBC WITH DIFFERENTIAL/PLATELET
Abs Immature Granulocytes: 0.22 10*3/uL — ABNORMAL HIGH (ref 0.00–0.07)
Basophils Absolute: 0 10*3/uL (ref 0.0–0.1)
Basophils Relative: 0 %
Eosinophils Absolute: 0.2 10*3/uL (ref 0.0–0.5)
Eosinophils Relative: 1 %
HCT: 27.7 % — ABNORMAL LOW (ref 36.0–46.0)
Hemoglobin: 9.3 g/dL — ABNORMAL LOW (ref 12.0–15.0)
Immature Granulocytes: 1 %
Lymphocytes Relative: 7 %
Lymphs Abs: 1.3 10*3/uL (ref 0.7–4.0)
MCH: 26.9 pg (ref 26.0–34.0)
MCHC: 33.6 g/dL (ref 30.0–36.0)
MCV: 80.1 fL (ref 80.0–100.0)
Monocytes Absolute: 1.2 10*3/uL — ABNORMAL HIGH (ref 0.1–1.0)
Monocytes Relative: 6 %
Neutro Abs: 15.1 10*3/uL — ABNORMAL HIGH (ref 1.7–7.7)
Neutrophils Relative %: 85 %
Platelets: 132 10*3/uL — ABNORMAL LOW (ref 150–400)
RBC: 3.46 MIL/uL — ABNORMAL LOW (ref 3.87–5.11)
RDW: 21.4 % — ABNORMAL HIGH (ref 11.5–15.5)
WBC: 18 10*3/uL — ABNORMAL HIGH (ref 4.0–10.5)
nRBC: 0 % (ref 0.0–0.2)

## 2022-02-27 LAB — SODIUM, URINE, RANDOM: Sodium, Ur: <10 mmol/L

## 2022-02-27 LAB — CREATININE, URINE, RANDOM: Creatinine, Urine: 123.6 mg/dL

## 2022-02-27 NOTE — Progress Notes (Signed)
?[facility] ?Progress Note  ? ? ?Assessment/ Plan:   ?1. AKI on CKD IIIa; Concern for hepatorenal syndrome ?Renal function essentially unchanged today after starting IV Albumin yesterday. Suspect hepatorenal syndrome given normal renal ultrasound at [facility], prior urine sodium <10, and overall clinical picture of advanced cirrhosis. ?-Urine electrolytes pending ?-Continue IV albumin 25% 50g daily (today is day #2 of 3) ?-Continue Midodrine '15mg'$  TID ?-Strict I/Os ?-Avoid nephrotoxic agents ?-Tentatively planning repeat attempt at TIPS per hospitalist note ? ?2. Hyponatremia ?Secondary to cirrhosis. Na 130 this morning which is unchanged from prior. ?-Follow daily labs ? ?3. Alcohol induced liver cirrhosis w/recurrent ascites, portal vein thrombosis, portal hypertensive gastropathy ?-Poor overall prognosis, palliative involved ?-Plan per primary and GI ?-Planning for repeat attempt at TIPS, timing TBD ? ?4. Anemia of Chronic Disease ?-Hgb stable at 9.3. ? ?5. C Diff Colitis  ?-Improving per patient report. Cont abx per primary ? ?Subjective:   ?No acute events overnight. Patient denies specific complaints this morning. Reports her diarrhea is decreasing in frequency. She feels she is eating/drinking well.  ? ?Objective:   ?BP 100/62 (BP Location: Left Arm)   Pulse 64   Temp 98.1 ?F (36.7 ?C) (Oral)   Resp 18   Ht '5\' 2"'$  (1.575 m)   Wt 78.8 kg   SpO2 99%   BMI 31.77 kg/m?  ? ?Intake/Output Summary (Last 24 hours) at 02/27/2022 1032 ?Last data filed at 02/27/2022 0900 ?Gross per 24 hour  ?Intake 440 ml  ?Output --  ?Net 440 ml  ?Unfortunately urine output is not well recorded, neither are daily weights ? ?Physical Exam: ?VFI:EPPIR, pleasant, NAD ?CVS:RRR, normal S1/S2 ?Resp:normal effort ?Abd: moderately distended (slightly increased from yesterday), nontender ?Ext: trace pedal edema ? ?Imaging: ?No results found. ? ?Labs: ?BMET ?Recent Labs  ?Lab 02/21/22 ?5188 02/22/22 ?0336 02/23/22 ?0446  02/24/22 ?0026 02/24/22 ?1325 02/24/22 ?1537 02/25/22 ?4166 02/26/22 ?0630 02/27/22 ?0804  ?NA 133* 134* 133* 132* 136 136 133* 130* 130*  ?K 5.4* 4.3 3.5 3.1* 3.0* 2.9* 3.9 4.3 4.7  ?CL 103 108 107 106  --   --  105 102 104  ?CO2 21* 19* 17* 17*  --   --  18* 18* 17*  ?GLUCOSE 80 76 93 95  --   --  86 98 87  ?BUN 44* 41* 38* 33*  --   --  32* 37* 41*  ?CREATININE 2.57* 2.35* 2.25* 2.07*  --   --  2.05* 2.37* 2.31*  ?CALCIUM 8.3* 8.0* 7.8* 7.9*  --   --  7.9* 8.1* 8.5*  ? ?CBC ?Recent Labs  ?Lab 02/20/22 ?1408 02/21/22 ?0342 02/24/22 ?0026 02/24/22 ?1325 02/24/22 ?1537 02/25/22 ?1601 02/26/22 ?0932 02/27/22 ?0804  ?WBC 18.1*   < > 17.4*  --   --  15.8* 17.3* 18.0*  ?NEUTROABS 15.6*  --   --   --   --  13.5* 14.8* 15.1*  ?HGB 10.6*   < > 9.1*   < > 9.2* 9.0* 9.2* 9.3*  ?HCT 33.8*   < > 28.0*   < > 27.0* 27.5* 28.7* 27.7*  ?MCV 81.4   < > 81.2  --   --  81.4 81.1 80.1  ?PLT 305   < > 173  --   --  134* 160 132*  ? < > = values in this interval not displayed.  ? ? ?Medications:   ? ? (feeding supplement) PROSource Plus  30 mL Oral BID BM  ? allopurinol  200  mg Oral Daily  ? amiodarone  200 mg Oral Daily  ? Chlorhexidine Gluconate Cloth  6 each Topical Daily  ? feeding supplement  1 Container Oral TID BM  ? levothyroxine  200 mcg Oral q morning  ? midodrine  15 mg Oral TID WC  ? multivitamin with minerals  1 tablet Oral Daily  ? pantoprazole  40 mg Oral BID  ? sertraline  50 mg Oral Daily  ? vancomycin  125 mg Oral QID  ? ? ? ?Alcus Dad, MD ?02/27/2022, 10:32 AM   ?

## 2022-02-27 NOTE — Progress Notes (Signed)
Pt had previously aborted TIPS procedure 02/24/22 d/t challenging portal vein anatomy. Pt was seen at bedside to inform her that she has been scheduled for outpatient TIPS procedure 03/23/2022. She was also advised that someone will call her closer to that time with preparation instructions.  ?Pt verbalized understanding and is in agreement with plan.  ? ? ? ?Narda Rutherford, AGNP-BC ?02/27/2022, 1:23 PM ?  ?

## 2022-02-27 NOTE — Progress Notes (Addendum)
?PROGRESS NOTE ? ? ? ?Diane Pruitt  DJM:[id number] DOB: 02-Apr-1951 DOA: 02/23/2022 ?PCP: [name], MD  ? ? ?Brief Narrative:  ?Diane Pruitt is a 71 y.o. 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   ?Patient was recently admitted to [facility] 4/3 -4/6 for upper GI bleeding, portal hypertensive gastropathy, ascites and was discharged to a skilled nursing facility near [place].  Patient tells me she felt well for a few days, however since then she felt poorly, poor appetite, weakness, dizziness and abdominal distention and diarrhea.   ?On admission to [place] she was found with AKI consistent with hepatorenal syndrome, C. difficile positive diarrhea. ?Paracentesis 4/26 ?IR consulted, noted portal vein thrombosis.  After careful discussion, decided to undergo TIPS procedure hence transferred to [facility]. ?Attempted TIPS and unable to do it.  Remains debilitated. ? ?Assessment & Plan: ?  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 Recurrent ascites.  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 ?Patient was taken to operating room for TIPS procedure, however could not be navigated because of difficult vascular anatomy. ?Unable to tolerate diuretics, will hold off on any diuretics.  1 dose of Lasix given 4/29 night for fluid overload. ?She completed 3 days of Rocephin and discontinued as no evidence of SBP. ?Paracentesis 4/25, transudate. ?Paracentesis 4/29, transudate. ?Patient has history of hypertensive gastropathy and recent GI bleeding, on PPI.  We will continue oral PPI. ?Bilirubin is slightly abnormal.  Transaminases are mildly  elevated but is stable. ?GI will follow-up.  Severe orthostatic hypotension responded to increasing dose of midodrine 15 mg 3 times daily.  No orthostatics today. ? ?Acute kidney injury with history of chronic kidney disease stage IIIa: Possible hepatorenal syndrome. ?Previous baseline creatinine 1.3.   ?Renal functions worsened and staying up at creatinine of 2.3. ?Renal ultrasound without obstruction.  Continue intake and output monitoring. ?Followed by nephrology, currently on albumin infusion daily, however with no clinical response.  Suggested palliation. ? ?C. difficile diarrhea: Diagnosed 4/25.  Continue vancomycin 125 mg 4 times daily for 10 days.  Diarrhea mostly improved. ?  ?Paroxysmal A-fib: Patient is sinus rhythm today.  Unable to anticoagulate due to ongoing bleeding.  Statin unable to be used due to abnormal liver functions. ?  ?Hypothyroidism: On Synthroid.  Continue.  TSH normal. ? ?Hypomagnesemia: Aggressively replaced with improvement. ? ?Work with PT and OT.  Referred to a skilled nursing facility.  Radiology will schedule follow-up TIPS procedure in 2 weeks. ?Seen by palliative, currently DNR.  May ultimately need hospice in the future. ?Transfer to SNF when bed is available. ? ?Called and updated patient's daughter on the phone. ?We should attempt another paracentesis if she does not get bed today before discharge . ?Outpatient palliative care with be appropriate. ? ? ?DVT prophylaxis: SCDs Start: 02/23/22 1736 ? ? ?Code Status: DNR ?Family Communication: None today. ?Disposition Plan: Status is: Inpatient ?Remains inpatient appropriate because: Inpatient procedures ?  ? ? ?Consultants:  ?Interventional radiology ?Gastroenterology ?Nephrology ? ?Procedures:  ?Paracentesis 4/25 ?Paracentesis 4/29, 3.7 L removed. ? ?Antimicrobials:  ?None.  Completed Rocephin. ? ? ?Subjective: ? ?Patient seen and examined.  No overnight events.  She had only 1 bowel movement last 24 hours and that was  more  formed.  She feels very weak and debilitated.  Patient herself denies any complaints. ? ? ?Objective: ?Vitals:  ? 02/26/22 1433 02/26/22 2012 02/27/22 0549 02/27/22 0736  ?BP: 117/69 (!) 101/55 100/65 100/62  ?Pulse: (!) 103 70  64  ?Resp: '18 18 18 18  '$ ?Temp: 98 ?F (36.7 ?C) 97.8 ?F (36.6 ?C) 98.1 ?F (36.7 ?C) 98.1 ?F (36.7 ?C)  ?TempSrc: Oral Oral Oral Oral  ?SpO2: 98% 95% 97% 99%  ?Weight:   78.8 kg   ?Height:      ? ? ?Intake/Output Summary (Last 24 hours) at 02/27/2022 1351 ?Last data filed at 02/27/2022 0900 ?Gross per 24 hour  ?Intake 220 ml  ?Output --  ?Net 220 ml  ? ?Filed Weights  ? 02/23/22 1818 02/27/22 0549  ?Weight: 77 kg 78.8 kg  ? ? ?Examination: ? ?General: Pale.  Debilitated and frail.  Not in distress.  Anxious and flat affect.  Sitting in the couch today.  On 1 L of oxygen. ?She has some ecchymosis of the skin. ?Cardiovascular: S1-S2 normal.  Regular rate rhythm.  Heart rate controlled. ?Respiratory: Bilateral clear.  On 1 L oxygen. ?Gastrointestinal: Soft.  Nontender.  Bowel sound present.   ?Ext: No edema or swelling.  No cyanosis.  No deformities. ?Neuro: Intact. ? ? ? ? ? ?Data Reviewed: I have personally reviewed following labs and imaging studies ? ?CBC: ?Recent Labs  ?Lab 02/20/22 ?1408 02/21/22 ?0342 02/23/22 ?0446 02/24/22 ?0026 02/24/22 ?1325 02/24/22 ?1537 02/25/22 ?8242 02/26/22 ?3536 02/27/22 ?0804  ?WBC 18.1*   < > 17.9* 17.4*  --   --  15.8* 17.3* 18.0*  ?NEUTROABS 15.6*  --   --   --   --   --  13.5* 14.8* 15.1*  ?HGB 10.6*   < > 9.1* 9.1* 10.5* 9.2* 9.0* 9.2* 9.3*  ?HCT 33.8*   < > 28.7* 28.0* 31.0* 27.0* 27.5* 28.7* 27.7*  ?MCV 81.4   < > 82.0 81.2  --   --  81.4 81.1 80.1  ?PLT 305   < > 191 173  --   --  134* 160 132*  ? < > = values in this interval not displayed.  ? ?Basic Metabolic Panel: ?Recent Labs  ?Lab 02/23/22 ?0446 02/24/22 ?0026 02/24/22 ?1325 02/24/22 ?1537 02/25/22 ?1443 02/26/22 ?1540 02/27/22 ?0804  ?NA 133* 132* 136 136 133* 130* 130*  ?K 3.5 3.1* 3.0* 2.9* 3.9 4.3  4.7  ?CL 107 106  --   --  105 102 104  ?CO2 17* 17*  --   --  18* 18* 17*  ?GLUCOSE 93 95  --   --  86 98 87  ?BUN 38* 33*  --   --  32* 37* 41*  ?CREATININE 2.25* 2.07*  --   --  2.05* 2.37* 2.31*  ?CALCIUM 7.8* 7.9*  --   --  7.9* 8.1* 8.5*  ?MG  --   --   --   --  1.5*  --   --   ? ?GFR: ?Estimated Creatinine Clearance: 22 mL/min (A) (by C-G formula based on SCr of 2.31 mg/dL (H)). ?Liver Function Tests: ?Recent Labs  ?Lab 02/23/22 ?0867 02/24/22 ?0026 02/25/22 ?6195 02/26/22 ?0932 02/27/22 ?6712  ?AST 157* 154* 219* 210* 185*  ?ALT 66* 63* 70* 79* 69*  ?ALKPHOS 100 101 98 102 118  ?BILITOT 1.0 1.0 1.5* 1.6* 1.6*  ?PROT 5.9* 5.7* 5.4* 5.6* 5.5*  ?ALBUMIN 2.9* 2.9* 3.0* 2.8* 3.0*  ? ?Recent Labs  ?  Lab 02/20/22 ?1408  ?LIPASE 43  ? ?No results for input(s): AMMONIA in the last 168 hours. ?Coagulation Profile: ?Recent Labs  ?Lab 02/20/22 ?1408 02/23/22 ?8756 02/24/22 ?0026  ?INR 1.3* 1.5* 1.5*  ? ?Cardiac Enzymes: ?No results for input(s): CKTOTAL, CKMB, CKMBINDEX, TROPONINI in the last 168 hours. ?BNP (last 3 results) ?No results for input(s): PROBNP in the last 8760 hours. ?HbA1C: ?No results for input(s): HGBA1C in the last 72 hours. ?CBG: ?No results for input(s): GLUCAP in the last 168 hours. ?Lipid Profile: ?No results for input(s): CHOL, HDL, LDLCALC, TRIG, CHOLHDL, LDLDIRECT in the last 72 hours. ?Thyroid Function Tests: ?No results for input(s): TSH, T4TOTAL, FREET4, T3FREE, THYROIDAB in the last 72 hours. ?Anemia Panel: ?No results for input(s): VITAMINB12, FOLATE, FERRITIN, TIBC, IRON, RETICCTPCT in the last 72 hours. ?Sepsis Labs: ?No results for input(s): PROCALCITON, LATICACIDVEN in the last 168 hours. ? ?Recent Results (from the past 240 hour(s))  ?Resp Panel by RT-PCR (Flu A&B, Covid) Nasopharyngeal Swab     Status: None  ? Collection Time: 02/20/22  3:03 PM  ? Specimen: Nasopharyngeal Swab; Nasopharyngeal(NP) swabs in vial transport medium  ?Result Value Ref Range Status  ? SARS Coronavirus 2 by RT  PCR NEGATIVE NEGATIVE Final  ?  Comment: (NOTE) ?SARS-CoV-2 target nucleic acids are NOT DETECTED. ? ?The SARS-CoV-2 RNA is generally detectable in upper respiratory ?specimens during the acute phase of infect

## 2022-02-27 NOTE — Progress Notes (Signed)
Occupational Therapy Treatment ?Patient Details ?Name: Diane Pruitt ?MRN: [id number] ?DOB: 1951-02-13 ?Today's Date: 02/27/2022 ? ? ?History of present illness The pt is a 71 yo female presenitng 4/28 from [facility] for TIPS procedure on 4/29. The pt had been admitted 4/25 - 4/28 due to hypertensive gastropathy. Of note, pt also admitted 4/3-4/6 for upper GI bleed and was d/c to SNF. PMH includes: depression, afib, ischemic stroke, CKD III, and alcoholic liver cirrhosis. ?  ?OT comments ? Pt progressed with chair transfer this session and stand pivot with RW. Pt fatigue and sustain in chair from PT session to this afternoon. Pt showing increased activity tolerance. Pt able to bridge buttocks supine. Recommendation for SNF at this time.   ? ?Recommendations for follow up therapy are one component of a multi-disciplinary discharge planning process, led by the attending physician.  Recommendations may be updated based on patient status, additional functional criteria and insurance authorization. ?   ?Follow Up Recommendations ? Skilled nursing-short term rehab (<3 hours/day)  ?  ?Assistance Recommended at Discharge Frequent or constant Supervision/Assistance  ?Patient can return home with the following ? A lot of help with walking and/or transfers;A lot of help with bathing/dressing/bathroom;Assistance with cooking/housework;Assist for transportation;Help with stairs or ramp for entrance ?  ?Equipment Recommendations ? Other (comment)  ?  ?Recommendations for Other Services   ? ?  ?Precautions / Restrictions Precautions ?Precautions: Fall;Other (comment) ?Precaution Comments: watch soft BP  ? ? ?  ? ?Mobility Bed Mobility ?Overal bed mobility: Needs Assistance ?Bed Mobility: Sit to Supine ?  ?  ?  ?Sit to supine: Min assist ?  ?General bed mobility comments: lifting bil le onto bed surface and able to bridge to move buttock to the middle of the bed ?  ? ?Transfers ?Overall transfer level: Needs  assistance ?Equipment used: Rolling walker (2 wheels) ?Transfers: Sit to/from Stand ?Sit to Stand: Mod assist ?  ?  ?Step pivot transfers: Min assist ?  ?  ?General transfer comment: (A) to elevate from bed surface ?  ?  ?Balance Overall balance assessment: Needs assistance ?Sitting-balance support: Bilateral upper extremity supported, Feet supported ?Sitting balance-Leahy Scale: Fair ?  ?Postural control: Posterior lean ?Standing balance support: Bilateral upper extremity supported, Reliant on assistive device for balance ?Standing balance-Leahy Scale: Poor ?  ?  ?  ?  ?  ?  ?  ?  ?  ?  ?  ?  ?   ? ?ADL either performed or assessed with clinical judgement  ? ?ADL Overall ADL's : Needs assistance/impaired ?Eating/Feeding: Set up;Sitting ?Eating/Feeding Details (indicate cue type and reason): no po intake of lunch. states "stomach upset" ?  ?  ?  ?  ?  ?  ?  ?  ?Lower Body Dressing: Moderate assistance;Bed level ?Lower Body Dressing Details (indicate cue type and reason): able to push socks off with finger tips in figure 4 ?Toilet Transfer: Moderate assistance;Stand-pivot;BSC/3in1 ?  ?  ?  ?  ?  ?  ?  ?  ? ?Extremity/Trunk Assessment Upper Extremity Assessment ?Upper Extremity Assessment: Generalized weakness ?  ?Lower Extremity Assessment ?Lower Extremity Assessment: Defer to PT evaluation ?  ?  ?  ? ?Vision   ?  ?  ?Perception   ?  ?Praxis   ?  ? ?Cognition Arousal/Alertness: Awake/alert ?Behavior During Therapy: Flat affect, WFL for tasks assessed/performed ?Overall Cognitive Status: Within Functional Limits for tasks assessed ?  ?  ?  ?  ?  ?  ?  ?  ?  ?  ?  ?  ?  ?  ?  ?  ?  ?  ?  ?   ?  Exercises   ? ?  ?Shoulder Instructions   ? ? ?  ?General Comments 2L [place]  ? ? ?Pertinent Vitals/ Pain       Pain Assessment ?Pain Assessment: No/denies pain ? ?Home Living   ?  ?  ?  ?  ?  ?  ?  ?  ?  ?  ?  ?  ?  ?  ?  ?  ?  ?  ? ?  ?Prior Functioning/Environment    ?  ?  ?  ?   ? ?Frequency ? Min 2X/week  ? ? ? ? ?  ?Progress  Toward Goals ? ?OT Goals(current goals can now be found in the care plan section) ? Progress towards OT goals: Progressing toward goals ? ?Acute Rehab OT Goals ?Patient Stated Goal: to lay down ?OT Goal Formulation: With patient ?Time For Goal Achievement: 03/10/22 ?Potential to Achieve Goals: Good ?ADL Goals ?Pt Will Perform Grooming: with modified independence;sitting ?Pt Will Perform Lower Body Dressing: with min assist;with adaptive equipment;sit to/from stand ?Pt Will Transfer to Toilet: with min assist;bedside commode;stand pivot transfer ?Pt Will Perform Toileting - Clothing Manipulation and hygiene: with min assist;sit to/from stand;sitting/lateral leans ?Pt/caregiver will Perform Home Exercise Program: Increased strength;Both right and left upper extremity;With written HEP provided;With theraband  ?Plan Discharge plan remains appropriate   ? ?Co-evaluation ? ? ?   ?  ?  ?  ?  ? ?  ?AM-PAC OT "6 Clicks" Daily Activity     ?Outcome Measure ? ? Help from another person eating meals?: A Little ?Help from another person taking care of personal grooming?: A Little ?Help from another person toileting, which includes using toliet, bedpan, or urinal?: A Lot ?Help from another person bathing (including washing, rinsing, drying)?: A Lot ?Help from another person to put on and taking off regular upper body clothing?: A Little ?Help from another person to put on and taking off regular lower body clothing?: A Lot ?6 Click Score: 15 ? ?  ?End of Session Equipment Utilized During Treatment: Rolling walker (2 wheels) ? ?OT Visit Diagnosis: Other abnormalities of gait and mobility (R26.89);Muscle weakness (generalized) (M62.81);Dizziness and giddiness (R42);History of falling (Z91.81) ?  ?Activity Tolerance Patient tolerated treatment well ?  ?Patient Left in bed;with call bell/phone within reach (CNA in room taking vitals and verbalized bed alarm off and needs to be set) ?  ?Nurse Communication Mobility status;Precautions ?   ? ?   ? ?Time: 9562-1308 ?OT Time Calculation (min): 17 min ? ?Charges: OT General Charges ?$OT Visit: 1 Visit ?OT Treatments ?$Self Care/Home Management : 8-22 mins ? ? ?Brynn, OTR/L  ?Acute Rehabilitation Services ?Office: [phone] ?. ? ? ?Jeri Modena ?02/27/2022, 3:54 PM ?

## 2022-02-27 NOTE — Progress Notes (Signed)
Notified doctor Ghimire that patients daughter would like for him to give her a call when he has time.  ? [name] [phone] ?

## 2022-02-27 NOTE — Progress Notes (Signed)
Physical Therapy Treatment ?Patient Details ?Name: Diane Pruitt ?MRN: [id number] ?DOB: June 01, 1951 ?Today's Date: 02/27/2022 ? ? ?History of Present Illness The pt is a 71 yo female presenitng 4/28 from [facility] for TIPS procedure on 4/29. The pt had been admitted 4/25 - 4/28 due to hypertensive gastropathy. Of note, pt also admitted 4/3-4/6 for upper GI bleed and was d/c to SNF. PMH includes: depression, afib, ischemic stroke, CKD III, and alcoholic liver cirrhosis. ? ?  ?PT Comments  ? ? Pt received in bed reporting need for BM. She required mod assist bed mobility, and mod assist +2 safety SPT bed>BSC>recliner. Sit to stand from [facility] with RW and static stand for NT to assist with hygiene following BM. Pt in recliner with feet elevated at end of session. ?   ?Recommendations for follow up therapy are one component of a multi-disciplinary discharge planning process, led by the attending physician.  Recommendations may be updated based on patient status, additional functional criteria and insurance authorization. ? ?Follow Up Recommendations ? Skilled nursing-short term rehab (<3 hours/day) ?  ?  ?Assistance Recommended at Discharge Frequent or constant Supervision/Assistance  ?Patient can return home with the following A little help with walking and/or transfers;A lot of help with walking and/or transfers;A little help with bathing/dressing/bathroom;Assistance with cooking/housework;Direct supervision/assist for medications management;Assist for transportation;Help with stairs or ramp for entrance ?  ?Equipment Recommendations ? None recommended by PT  ?  ?Recommendations for Other Services   ? ? ?  ?Precautions / Restrictions Precautions ?Precautions: Fall;Other (comment) ?Precaution Comments: watch soft BP  ?  ? ?Mobility ? Bed Mobility ?Overal bed mobility: Needs Assistance ?Bed Mobility: Supine to Sit ?  ?  ?Supine to sit: Mod assist, HOB elevated ?  ?  ?General bed mobility comments: +rail, assist to  elevate trunk and move BLE off EOB, use of bed pad to scoot to EOB ?  ? ?Transfers ?Overall transfer level: Needs assistance ?Equipment used: Rolling walker (2 wheels) ?Transfers: Sit to/from Stand, Bed to chair/wheelchair/BSC ?Sit to Stand: +2 safety/equipment, Min assist ?  ?Step pivot transfers: Mod assist, +2 safety/equipment ?  ?  ?  ?General transfer comment: step pivot transfer without AD bed >BSC>recliner. Sit to stand with RW from [facility] with static stand for toilet hygiene following BM. Assist to power up and stabilize balance. ?  ? ?Ambulation/Gait ?  ?  ?  ?  ?  ?  ?  ?General Gait Details: unable due to weakness/fatigue ? ? ?Stairs ?  ?  ?  ?  ?  ? ? ?Wheelchair Mobility ?  ? ?Modified Rankin (Stroke Patients Only) ?  ? ? ?  ?Balance Overall balance assessment: Needs assistance ?Sitting-balance support: Feet supported, Single extremity supported ?Sitting balance-Leahy Scale: Fair ?  ?  ?Standing balance support: Bilateral upper extremity supported, During functional activity ?Standing balance-Leahy Scale: Poor ?Standing balance comment: reliant on external assist ?  ?  ?  ?  ?  ?  ?  ?  ?  ?  ?  ?  ? ?  ?Cognition Arousal/Alertness: Awake/alert ?Behavior During Therapy: Flat affect, WFL for tasks assessed/performed ?Overall Cognitive Status: Within Functional Limits for tasks assessed ?  ?  ?  ?  ?  ?  ?  ?  ?  ?  ?  ?  ?  ?  ?  ?  ?  ?  ?  ? ?  ?Exercises   ? ?  ?General Comments General comments (skin integrity, edema,  etc.): Pt on 2L continuous O2. ?  ?  ? ?Pertinent Vitals/Pain Pain Assessment ?Pain Assessment: No/denies pain  ? ? ?Home Living   ?  ?  ?  ?  ?  ?  ?  ?  ?  ?   ?  ?Prior Function    ?  ?  ?   ? ?PT Goals (current goals can now be found in the care plan section) Acute Rehab PT Goals ?Patient Stated Goal: get better ?Progress towards PT goals: Progressing toward goals ? ?  ?Frequency ? ? ? Min 2X/week ? ? ? ?  ?PT Plan Current plan remains appropriate  ? ? ?Co-evaluation   ?  ?  ?  ?  ? ?   ?AM-PAC PT "6 Clicks" Mobility   ?Outcome Measure ? Help needed turning from your back to your side while in a flat bed without using bedrails?: A Little ?Help needed moving from lying on your back to sitting on the side of a flat bed without using bedrails?: A Lot ?Help needed moving to and from a bed to a chair (including a wheelchair)?: A Lot ?Help needed standing up from a chair using your arms (e.g., wheelchair or bedside chair)?: A Lot ?Help needed to walk in hospital room?: Total ?Help needed climbing 3-5 steps with a railing? : Total ?6 Click Score: 11 ? ?  ?End of Session Equipment Utilized During Treatment: Gait belt;Oxygen ?Activity Tolerance: Patient tolerated treatment well ?Patient left: in chair;with call bell/phone within reach ?Nurse Communication: Mobility status ?PT Visit Diagnosis: Other abnormalities of gait and mobility (R26.89);Unsteadiness on feet (R26.81);Muscle weakness (generalized) (M62.81) ?  ? ? ?Time: 9147-8295 ?PT Time Calculation (min) (ACUTE ONLY): 23 min ? ?Charges:  $Therapeutic Activity: 23-37 mins          ?          ? ?Lorrin Goodell, PT  ?Office # [phone] ?Pager [phone] ? ? ? ?Lorriane Shire ?02/27/2022, 12:21 PM ? ?

## 2022-02-28 LAB — COMPREHENSIVE METABOLIC PANEL
ALT: 61 U/L — ABNORMAL HIGH (ref 0–44)
AST: 182 U/L — ABNORMAL HIGH (ref 15–41)
Albumin: 3 g/dL — ABNORMAL LOW (ref 3.5–5.0)
Alkaline Phosphatase: 109 U/L (ref 38–126)
Anion gap: 10 (ref 5–15)
BUN: 47 mg/dL — ABNORMAL HIGH (ref 8–23)
CO2: 16 mmol/L — ABNORMAL LOW (ref 22–32)
Calcium: 8.6 mg/dL — ABNORMAL LOW (ref 8.9–10.3)
Chloride: 104 mmol/L (ref 98–111)
Creatinine, Ser: 2.34 mg/dL — ABNORMAL HIGH (ref 0.44–1.00)
GFR, Estimated: 22 mL/min — ABNORMAL LOW (ref >60)
Glucose, Bld: 108 mg/dL — ABNORMAL HIGH (ref 70–99)
Potassium: 5.6 mmol/L — ABNORMAL HIGH (ref 3.5–5.1)
Sodium: 130 mmol/L — ABNORMAL LOW (ref 135–145)
Total Bilirubin: 2.2 mg/dL — ABNORMAL HIGH (ref 0.3–1.2)
Total Protein: 5.5 g/dL — ABNORMAL LOW (ref 6.5–8.1)

## 2022-02-28 LAB — CBC
HCT: 25.6 % — ABNORMAL LOW (ref 36.0–46.0)
Hemoglobin: 8.4 g/dL — ABNORMAL LOW (ref 12.0–15.0)
MCH: 26.4 pg (ref 26.0–34.0)
MCHC: 32.8 g/dL (ref 30.0–36.0)
MCV: 80.5 fL (ref 80.0–100.0)
Platelets: 142 10*3/uL — ABNORMAL LOW (ref 150–400)
RBC: 3.18 MIL/uL — ABNORMAL LOW (ref 3.87–5.11)
RDW: 21.2 % — ABNORMAL HIGH (ref 11.5–15.5)
WBC: 16.8 10*3/uL — ABNORMAL HIGH (ref 4.0–10.5)
nRBC: 0 % (ref 0.0–0.2)

## 2022-02-28 LAB — BASIC METABOLIC PANEL
Anion gap: 11 (ref 5–15)
BUN: 47 mg/dL — ABNORMAL HIGH (ref 8–23)
CO2: 16 mmol/L — ABNORMAL LOW (ref 22–32)
Calcium: 8.8 mg/dL — ABNORMAL LOW (ref 8.9–10.3)
Chloride: 104 mmol/L (ref 98–111)
Creatinine, Ser: 2.29 mg/dL — ABNORMAL HIGH (ref 0.44–1.00)
GFR, Estimated: 22 mL/min — ABNORMAL LOW (ref >60)
Glucose, Bld: 110 mg/dL — ABNORMAL HIGH (ref 70–99)
Potassium: 4.2 mmol/L (ref 3.5–5.1)
Sodium: 131 mmol/L — ABNORMAL LOW (ref 135–145)

## 2022-02-28 LAB — AMMONIA: Ammonia: 46 umol/L — ABNORMAL HIGH (ref 9–35)

## 2022-02-28 MED ORDER — SODIUM BICARBONATE 650 MG PO TABS
650.0000 mg | ORAL_TABLET | Freq: Three times a day (TID) | ORAL | Status: DC
  Administered 2022-02-28 – 2022-03-07 (×22): 650 mg via ORAL
  Filled 2022-02-28 (×22): qty 1

## 2022-02-28 NOTE — Progress Notes (Signed)
? ? ? [facility] ?                                                                            ? ? ?[name], is a 71 y.o. female, DOB - [birth year], BSW:[id number] ?Admit date - 02/23/2022    ?Outpatient Primary MD for the patient is Diane Lass, MD ? ?LOS - 5  days ? ? ? ?Brief summary  ? ?Diane Pruitt is a 71 y.o. female with medical history significant for depression, paroxysmal atrial fibrillation no longer on Eliquis, history of ischemic stroke, stage 3A CKD, alcoholic liver cirrhosis with complications of hypertensive gastropathy who was admitted to [facility] since 4/25 and transferred to [facility] for IR procedure.   ?Patient was recently admitted to [facility] 4/3 -4/6 for upper GI bleeding, portal hypertensive gastropathy, ascites and was discharged to a skilled nursing facility near [place].  Patient tells me she felt well for a few days, however since then she felt poorly, poor appetite, weakness, dizziness and abdominal distention and diarrhea.   ?On admission to [facility] she was found with AKI consistent with hepatorenal syndrome, C. difficile positive diarrhea. ?Paracentesis 4/26 ?IR consulted, noted portal vein thrombosis.  After careful discussion, decided to undergo TIPS procedure hence transferred to [facility]. ?Attempted TIPS and unable to do it.  Remains debilitated ? ? ?Assessment & Plan  ? ? ?Assessment and Plan: ? 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 ?Unable to do TIPS due to difficult anatomy.  Unable to tolerate diuretics due to severe orthostatic vital signs. ?Patient underwent paracentesis on 4/25, 4/29. ? ? ? ?Acute kidney injury with stage IIIa CKD ?Possible hepatorenal syndrome ?Nephrology on board and appreciate recommendations ?Did not improve albumin infusions.  ? ? ?C diff diarrhea:  ?Continue vancomycin to complete 10 day course.   ? ? ? ?Hypothyroidism:  ?On synthroid.  ? ?Hypomagnesemia:  ?Replaced.  ? ? ?Acute Metabolic encephalopathy:  ?- get ammonia levels.  ? ? ?Hyponatremia:  ?From fluid overload and liver failure.  ? ?Hypotension:  ?On midodrine 14 mg TID.  ? ? ? ?Hyperkalemia:  ?Resolved.  ? ? ?Anemia of chronic disease:  ?- hemoglobin around 8.4.  ? ? ?Thrombocytopenia:  ?Mild, continue to improve.  ?  ? ?Atrial fibrillation paroxysmal  ?- on amiodarone 200 mg daily.  ?Not on anti coagulation.  ? ?Pt continues to decline, family requested to speak to palliative care today.  ?RN Pressure Injury Documentation: ?  ? ?Malnutrition Type: ? ?Nutrition Problem: Increased nutrient needs ?Etiology: chronic illness (cirrhosis) ? ? ?Malnutrition Characteristics: ? ?Signs/Symptoms: estimated needs ? ? ?Nutrition Interventions: ? ?Interventions: Refer to RD note for recommendations ? ?Estimated body mass index is 32.58 kg/m? as calculated from the following: ?  Height as of this encounter: '5\' 2"'$  (1.575 m). ?  Weight as of this encounter: 80.8 kg. ? ?Code Status: DNR ?DVT Prophylaxis:  SCDs Start: 02/23/22 1736 ? ? ?Level of Care: Level of care: Med-Surg ?Family Communication: Updated patient's  ?Disposition Plan:     Remains inpatient appropriate:  clinical decline.  ? ?Procedures:  ?None.  ? ?Consultants:   ?  Palliative care  ?Nephrology.  ? ?Antimicrobials:  ? ?Anti-infectives (From admission, onward)  ? ? Start     Dose/Rate Route Frequency Ordered Stop  ? 02/24/22 1000  cefOXitin (MEFOXIN) 2 g in sodium chloride 0.9 % 100 mL IVPB       ? 2 g ?200 mL/hr over 30 Minutes Intravenous  Once 02/24/22 0956 02/24/22 1041  ? 02/23/22 1830  vancomycin (VANCOCIN) capsule 125 mg       ? 125 mg Oral 4 times daily 02/23/22 1735 03/05/22 1759  ? ?  ? ? ? ?Medications ? ?Scheduled Meds: ? (feeding supplement) PROSource Plus  30 mL Oral BID BM  ? allopurinol  200 mg Oral Daily  ? amiodarone  200 mg Oral Daily  ? Chlorhexidine Gluconate Cloth  6 each Topical  Daily  ? feeding supplement  1 Container Oral TID BM  ? levothyroxine  200 mcg Oral q morning  ? midodrine  15 mg Oral TID WC  ? multivitamin with minerals  1 tablet Oral Daily  ? pantoprazole  40 mg Oral BID  ? sertraline  50 mg Oral Daily  ? sodium bicarbonate  650 mg Oral TID  ? vancomycin  125 mg Oral QID  ? ?Continuous Infusions: ?PRN Meds:.acetaminophen **OR** acetaminophen, ondansetron **OR** ondansetron (ZOFRAN) IV ? ? ? ?Subjective:  ? ?Diane Pruitt was seen and examined today. ?Objective:  ? ?Vitals:  ? 02/27/22 2002 02/28/22 0500 02/28/22 0602 02/28/22 1619  ?BP: (!) 110/59  111/60 (!) 114/52  ?Pulse: 69  78 69  ?Resp: '17  20 16  '$ ?Temp: 98.2 ?F (36.8 ?C)  98.5 ?F (36.9 ?C) (!) 97.4 ?F (36.3 ?C)  ?TempSrc: Oral  Oral Oral  ?SpO2: 97%  94% 98%  ?Weight:  80.8 kg    ?Height:      ? ?No intake or output data in the 24 hours ending 02/28/22 1721 ?Filed Weights  ? 02/23/22 1818 02/27/22 0549 02/28/22 0500  ?Weight: 77 kg 78.8 kg 80.8 kg  ? ? ? ?Exam ?General exam: Appears calm and comfortable  ?Respiratory system: Clear to auscultation. Respiratory effort normal. ?Cardiovascular system: S1 & S2 heard, RRR. No JVD, . No pedal edema. ?Gastrointestinal system: Abdomen is nondistended, soft and nontender. Normal bowel sounds heard. ?Central nervous system: Alert and oriented. No focal neurological deficits. ?Extremities: Symmetric 5 x 5 power. ?Skin: No rashes, lesions or ulcers ?Psychiatry: Mood & affect appropriate.  ? ? ?Data Reviewed:  I have personally reviewed following labs and imaging studies ? ? ?CBC ?Lab Results  ?Component Value Date  ? WBC 16.8 (H) 02/28/2022  ? RBC 3.18 (L) 02/28/2022  ? HGB 8.4 (L) 02/28/2022  ? HCT 25.6 (L) 02/28/2022  ? MCV 80.5 02/28/2022  ? MCH 26.4 02/28/2022  ? PLT 142 (L) 02/28/2022  ? MCHC 32.8 02/28/2022  ? RDW 21.2 (H) 02/28/2022  ? LYMPHSABS 1.3 02/27/2022  ? MONOABS 1.2 (H) 02/27/2022  ? EOSABS 0.2 02/27/2022  ? BASOSABS 0.0 02/27/2022  ? ? ? ?Last metabolic panel ?Lab  Results  ?Component Value Date  ? NA 131 (L) 02/28/2022  ? K 4.2 02/28/2022  ? CL 104 02/28/2022  ? CO2 16 (L) 02/28/2022  ? BUN 47 (H) 02/28/2022  ? CREATININE 2.29 (H) 02/28/2022  ? GLUCOSE 110 (H) 02/28/2022  ? GFRNONAA 22 (L) 02/28/2022  ? GFRAA >60 02/15/2020  ? CALCIUM 8.8 (L) 02/28/2022  ? PHOS 2.8 01/31/2022  ? PROT 5.5 (L) 02/28/2022  ? ALBUMIN 3.0 (L) 02/28/2022  ?  BILITOT 2.2 (H) 02/28/2022  ? ALKPHOS 109 02/28/2022  ? AST 182 (H) 02/28/2022  ? ALT 61 (H) 02/28/2022  ? ANIONGAP 11 02/28/2022  ? ? ?CBG (last 3)  ?No results for input(s): GLUCAP in the last 72 hours.  ? ? ?Coagulation Profile: ?Recent Labs  ?Lab 02/23/22 ?3893 02/24/22 ?0026  ?INR 1.5* 1.5*  ? ? ? ?Radiology Studies: ?No results found. ? ? ? ? ?Hosie Poisson M.D. ?[facility] ?02/28/2022, 5:21 PM ? ?Available via Epic secure chat 7am-7pm ?After 7 pm, please refer to night coverage provider listed on amion. ? ? ? ?

## 2022-02-28 NOTE — Progress Notes (Signed)
Mobility Specialist Progress Note: ? ? 02/28/22 1551  ?Mobility  ?Activity Transferred to/from [facility];Transferred from bed to chair  ?Level of Assistance Contact guard assist, steadying assist  ?Assistive Device BSC ?(HHa)  ?Distance Ambulated (ft) 4 ft  ?Activity Response Tolerated well  ?$Mobility charge 1 Mobility  ? ?Pt received in bed willing to get to chair. Pt needing to use BR. Able to stand and pivot to [facility], had a BM. Then transferred to chair with HHA. Left in chair with call bell in reach and all needs met.  ? ?Diane Pruitt ?Mobility Specialist ?Primary Phone [phone]' ?

## 2022-02-28 NOTE — [place] FL2 (Signed)
?[place] MEDICAID FL2 LEVEL OF CARE SCREENING TOOL  ?  ? ?IDENTIFICATION  ?Patient Name: ?Diane Pruitt Birthdate: [birth year] Sex: female Admission Date (Current Location): ?02/23/2022  ?[place] and [place] Number: ? [place] ?  Facility and Address:  ?[facility] [place] [address] [place], [place] [address] ?     Provider Number: ?[id number]  ?Attending Physician Name and Address:  ?Hosie Poisson, MD ? Relative Name and Phone Number:  ?  ?   ?Current Level of Care: ?  Recommended Level of Care: ?[place] Prior Approval Number: ?  ? ?Date Approved/Denied: ?  PASRR Number: ?pending ? ?Discharge Plan: ?SNF ?  ? ?Current Diagnoses: ?Patient Active Problem List  ? Diagnosis Date Noted  ? Hyperkalemia 02/22/2022  ? Abnormal urinalysis 02/22/2022  ? Portal vein thrombosis 02/22/2022  ? Ascites due to alcoholic cirrhosis (HCC)   ? Clostridium difficile infection 02/21/2022  ? 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 Orthostatic hypotension with weakness and falls  01/29/2022  ? Hyponatremia 01/29/2022  ? Weakness 01/29/2022  ? Portal hypertensive gastropathy ([place]) 01/25/2022  ? Hyperlipidemia 01/21/2022  ? Hypokalemia 01/06/2022  ? Acute blood loss anemia 01/04/2022  ? 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 AKI (acute kidney injury) ([place]) 01/04/2022  ? Elevated LFTs 01/04/2022  ? Gout 01/04/2022  ? Depression 01/04/2022  ? GI bleed 01/04/2022  ? Iron deficiency anemia due to chronic blood loss 01/03/2022  ? Unspecified abnormal finding in specimens from other organs, systems and tissues 11/20/2021  ? Age-related osteoporosis without current pathological fracture 11/01/2021  ? Chronic gouty arthritis 11/01/2021  ? Excessive sleepiness 11/01/2021  ? History of embolic stroke 23/53/6144  ? History of tobacco use 11/01/2021  ? Hypercoagulable state ([place]) 11/01/2021  ? Iron deficiency anemia 11/01/2021  ? Obesity due to excess  calories 11/01/2021  ? Personal history of colonic polyps 11/01/2021  ? Polyp of colon 11/01/2021  ? Severe major depression, single episode, without psychotic features ([place]) 11/01/2021  ? Acquired hypothyroidism 02/12/2020  ? Compression fracture of L2 ([place]) 02/01/2020  ? Low back pain 01/18/2020  ? Pain of right hip joint 01/18/2020  ? Paroxysmal atrial fibrillation ([employer]) 01/25/2018  ? Cyst of finger 01/23/2018  ? ? ?Orientation RESPIRATION BLADDER Height & Weight   ?  ?Self, Situation, Time, Place ? Normal Incontinent Weight: 178 lb 2.1 oz (80.8 kg) ?Height:  '5\' 2"'$  (157.5 cm)  ?BEHAVIORAL SYMPTOMS/MOOD NEUROLOGICAL BOWEL NUTRITION STATUS  ?    Incontinent Diet  ?AMBULATORY STATUS COMMUNICATION OF NEEDS Skin   ?Extensive Assist Verbally Normal ?  ?  ?  ?    ?     ?     ? ? ?Personal Care Assistance Level of Assistance  ?Bathing, Feeding, Dressing Bathing Assistance: Maximum assistance ?Feeding assistance: Maximum assistance ?Dressing Assistance: Maximum assistance ?   ? ?Functional Limitations Info  ?Sight, Hearing, Speech Sight Info: Adequate ?Hearing Info: Adequate ?Speech Info: Adequate  ? ? ?SPECIAL CARE FACTORS FREQUENCY  ?PT (By licensed PT), OT (By licensed OT)   ?  ?PT Frequency: 5x a week ?OT Frequency: 5x aweek ?  ?  ?  ?   ? ? ?Contractures Contractures Info: Not present  ? ? ?Additional Factors Info  ?Code Status, Allergies, Isolation Precautions Code Status Info: DNR ?Allergies Info: Colchicine ?  ?  ?Isolation Precautions Info: Enteric ?   ? ?Current Medications (02/28/2022):  This is the current hospital  active medication list ?Current Facility-Administered Medications  ?Medication Dose Route Frequency Provider Last Rate Last Admin  ? (feeding supplement) PROSource Plus liquid 30 mL  30 mL Oral BID BM Barb Merino, MD   30 mL at 02/28/22 0900  ? acetaminophen (TYLENOL) tablet 650 mg  650 mg Oral Q6H PRN Barb Merino, MD      ? Or  ? acetaminophen (TYLENOL) suppository 650 mg  650 mg Rectal Q6H PRN  Barb Merino, MD      ? allopurinol (ZYLOPRIM) tablet 200 mg  200 mg Oral Daily Barb Merino, MD   200 mg at 02/28/22 0900  ? amiodarone (PACERONE) tablet 200 mg  200 mg Oral Daily Barb Merino, MD   200 mg at 02/28/22 0900  ? Chlorhexidine Gluconate Cloth 2 % PADS 6 each  6 each Topical Daily Barb Merino, MD   6 each at 02/28/22 [phone]  ? feeding supplement (BOOST / RESOURCE BREEZE) liquid 1 Container  1 Container Oral TID BM Barb Merino, MD   1 Container at 02/28/22 [phone]  ? levothyroxine (SYNTHROID) tablet 200 mcg  200 mcg Oral q morning Barb Merino, MD   200 mcg at 02/28/22 0518  ? midodrine (PROAMATINE) tablet 15 mg  15 mg Oral TID WC Barb Merino, MD   15 mg at 02/28/22 1109  ? multivitamin with minerals tablet 1 tablet  1 tablet Oral Daily Barb Merino, MD   1 tablet at 02/28/22 0900  ? ondansetron (ZOFRAN) tablet 4 mg  4 mg Oral Q6H PRN Barb Merino, MD      ? Or  ? ondansetron (ZOFRAN) injection 4 mg  4 mg Intravenous Q6H PRN Barb Merino, MD      ? pantoprazole (PROTONIX) EC tablet 40 mg  40 mg Oral BID Barb Merino, MD   40 mg at 02/28/22 0900  ? sertraline (ZOLOFT) tablet 50 mg  50 mg Oral Daily Barb Merino, MD   50 mg at 02/28/22 0900  ? sodium bicarbonate tablet 650 mg  650 mg Oral TID Elmarie Shiley, MD   650 mg at 02/28/22 1109  ? vancomycin (VANCOCIN) capsule 125 mg  125 mg Oral QID Barb Merino, MD   125 mg at 02/28/22 0900  ? ? ? ?Discharge Medications: ?Please see discharge summary for a list of discharge medications. ? ?Relevant Imaging Results: ? ?Relevant Lab Results: ? ? ?Additional Information ?[phone], Both Covid Vaccines and 1 Booster ? ?Emeterio Reeve, LCSW ? ? ? ? ?

## 2022-02-28 NOTE — TOC Progression Note (Signed)
Transition of Care (TOC) - Progression Note  ? ? ?Patient Details  ?Name: Nekeisha Aure ?MRN: [id number] ?Date of Birth: 1951/07/13 ? ?Transition of Care (TOC) CM/SW Contact  ?Emeterio Reeve, LCSW ?Phone Number: ?02/28/2022, 4:14 PM ? ?Clinical Narrative:    ? ?CSW spoke to pts daughter [name]. [name] stated that her mom is adamant she is not returning to [facility]. [name] and pt want pt to go to SNF. CSW gave [employer] [email or web address] list of [place] facilities to review. CSW marked facilities that accept [employer].  ? ?[name] asked CSW to speak outside the room. [name] stated that she would like to speak to hospice to discuss options. [name] states she doesn't feel like SNF is the best option. CSW informed MD and Palliative team.  ? ? ?Expected Discharge Plan: [place] ?Barriers to Discharge: Continued Medical Work up, Ship broker ? ?Expected Discharge Plan and Services ?Expected Discharge Plan: [place] ?  ?  ?Post Acute Care Choice: [place] ?Living arrangements for the past 2 months: [place] ?                ?  ?  ?  ?  ?  ?  ?  ?  ?  ?  ? ? ?Social Determinants of Health (SDOH) Interventions ?  ? ?Readmission Risk Interventions ?   ? View : No data to display.  ?  ?  ?  ? ?Emeterio Reeve, LCSW ?Clinical Social Worker ? ? ?

## 2022-02-28 NOTE — Social Work (Signed)
Diane Pruitt ?February 22, 1951 ? ?Please be advised that the above-named patient will require a short-term nursing home stay - anticipated 30 days or less for rehabilitation and strengthening.  The plan is for return home. ? ?

## 2022-02-28 NOTE — Progress Notes (Addendum)
?[facility] ?Progress Note  ? ? ?Assessment/ Plan:   ?1. Acute kidney injury on CKD IIIa ?Presumed hepatorenal etiology in the setting of her advanced cirrhosis, urine sodium <10, and normal UA. Kidney function unchanged today despite IV Albumin x2 days and midodrine '15mg'$  TID. Overall poor prognosis. ?-Continue IV Albumin 25% 50g daily (today is day #3 of 3) ?-Continue Midodrine '15mg'$  TID ?-Strict I/Os ?-Patient scheduled for outpatient TIPS on 03/21/2022 ?-Consider gentle diuresis starting tomorrow, previously did not tolerate due to hypotension ? ?2. Hyperkalemia ?K elevated to 5.6 today. May be secondary to poor renal function in the setting of her HRS. She is not receiving any K supplementation.  ?-Obtain confirmatory BMP ?-If persistently elevated, give Lokelma 10g x1 ?-Check CBC to rule out bleeding as possible etiology ? ?3. Metabolic Acidosis ?-Start sodium bicarb '650mg'$  TID ?-Daily renal function panel ? ?4. Alcohol induced liver cirrhosis w/recurrent ascites, portal vein thrombosis, portal hypertensive gastropathy ?-Management per primary and GI ?-Consider re-trial of gentle diuresis ?-Planning repeat TIPS attempt on 03/10/2022 ? ?5. Hyponatremia ?Na unchanged at 130. Presumed secondary to underlying cirrhosis. ?-Monitor on daily labs ? ?Subjective:   ?No acute events overnight. Patient denies specific complaints this morning. Eating/drinking ok. Urinating without difficulty. Diarrhea continues to decrease in frequency.  ? ?Objective:   ?BP 111/60 (BP Location: Left Arm)   Pulse 78   Temp 98.5 ?F (36.9 ?C) (Oral)   Resp 20   Ht '5\' 2"'$  (1.575 m)   Wt 80.8 kg   SpO2 94%   BMI 32.58 kg/m?  ? ?Intake/Output Summary (Last 24 hours) at 02/28/2022 1202 ?Last data filed at 02/27/2022 1705 ?Gross per 24 hour  ?Intake 253.09 ml  ?Output --  ?Net 253.09 ml  ? ?Weight change: 2 kg ? ?Physical Exam: ?Gen: alert, chronically ill appearing, NAD ?CVS:RRR, normal S1/S2 ?Resp: normal effort ?Abd: mildly  distended, not tense, nontender, +hepatomegaly ?Ext: trace pedal edema ? ?Imaging: ?No results found. ? ?Labs: ?BMET ?Recent Labs  ?Lab 02/22/22 ?0336 02/23/22 ?0446 02/24/22 ?0026 02/24/22 ?1325 02/24/22 ?1537 02/25/22 ?3532 02/26/22 ?9924 02/27/22 ?0804 02/28/22 ?2683  ?NA 134* 133* 132* 136 136 133* 130* 130* 130*  ?K 4.3 3.5 3.1* 3.0* 2.9* 3.9 4.3 4.7 5.6*  ?CL 108 107 106  --   --  105 102 104 104  ?CO2 19* 17* 17*  --   --  18* 18* 17* 16*  ?GLUCOSE 76 93 95  --   --  86 98 87 108*  ?BUN 41* 38* 33*  --   --  32* 37* 41* 47*  ?CREATININE 2.35* 2.25* 2.07*  --   --  2.05* 2.37* 2.31* 2.34*  ?CALCIUM 8.0* 7.8* 7.9*  --   --  7.9* 8.1* 8.5* 8.6*  ? ?CBC ?Recent Labs  ?Lab 02/24/22 ?0026 02/24/22 ?1325 02/24/22 ?1537 02/25/22 ?4196 02/26/22 ?2229 02/27/22 ?0804  ?WBC 17.4*  --   --  15.8* 17.3* 18.0*  ?NEUTROABS  --   --   --  13.5* 14.8* 15.1*  ?HGB 9.1*   < > 9.2* 9.0* 9.2* 9.3*  ?HCT 28.0*   < > 27.0* 27.5* 28.7* 27.7*  ?MCV 81.2  --   --  81.4 81.1 80.1  ?PLT 173  --   --  134* 160 132*  ? < > = values in this interval not displayed.  ? ? ?Medications:   ? ? (feeding supplement) PROSource Plus  30 mL Oral BID BM  ? allopurinol  200 mg  Oral Daily  ? amiodarone  200 mg Oral Daily  ? Chlorhexidine Gluconate Cloth  6 each Topical Daily  ? feeding supplement  1 Container Oral TID BM  ? levothyroxine  200 mcg Oral q morning  ? midodrine  15 mg Oral TID WC  ? multivitamin with minerals  1 tablet Oral Daily  ? pantoprazole  40 mg Oral BID  ? sertraline  50 mg Oral Daily  ? sodium bicarbonate  650 mg Oral TID  ? vancomycin  125 mg Oral QID  ? ? ? ?Alcus Dad, MD ?02/28/2022, 12:02 PM  ? ?

## 2022-03-01 ENCOUNTER — Encounter (HOSPITAL_COMMUNITY): Payer: Self-pay

## 2022-03-01 DIAGNOSIS — K767 Hepatorenal syndrome: Secondary | ICD-10-CM

## 2022-03-01 LAB — CBC WITH DIFFERENTIAL/PLATELET
Abs Immature Granulocytes: 0.27 10*3/uL — ABNORMAL HIGH (ref 0.00–0.07)
Basophils Absolute: 0 10*3/uL (ref 0.0–0.1)
Basophils Relative: 0 %
Eosinophils Absolute: 0.2 10*3/uL (ref 0.0–0.5)
Eosinophils Relative: 1 %
HCT: 26.3 % — ABNORMAL LOW (ref 36.0–46.0)
Hemoglobin: 8.8 g/dL — ABNORMAL LOW (ref 12.0–15.0)
Immature Granulocytes: 2 %
Lymphocytes Relative: 6 %
Lymphs Abs: 1 10*3/uL (ref 0.7–4.0)
MCH: 26.7 pg (ref 26.0–34.0)
MCHC: 33.5 g/dL (ref 30.0–36.0)
MCV: 79.7 fL — ABNORMAL LOW (ref 80.0–100.0)
Monocytes Absolute: 0.9 10*3/uL (ref 0.1–1.0)
Monocytes Relative: 5 %
Neutro Abs: 14.4 10*3/uL — ABNORMAL HIGH (ref 1.7–7.7)
Neutrophils Relative %: 86 %
Platelets: 143 10*3/uL — ABNORMAL LOW (ref 150–400)
RBC: 3.3 MIL/uL — ABNORMAL LOW (ref 3.87–5.11)
RDW: 21.5 % — ABNORMAL HIGH (ref 11.5–15.5)
WBC: 16.8 10*3/uL — ABNORMAL HIGH (ref 4.0–10.5)
nRBC: 0 % (ref 0.0–0.2)

## 2022-03-01 LAB — RENAL FUNCTION PANEL
Albumin: 3.3 g/dL — ABNORMAL LOW (ref 3.5–5.0)
Anion gap: 10 (ref 5–15)
BUN: 50 mg/dL — ABNORMAL HIGH (ref 8–23)
CO2: 16 mmol/L — ABNORMAL LOW (ref 22–32)
Calcium: 8.7 mg/dL — ABNORMAL LOW (ref 8.9–10.3)
Chloride: 105 mmol/L (ref 98–111)
Creatinine, Ser: 2.27 mg/dL — ABNORMAL HIGH (ref 0.44–1.00)
GFR, Estimated: 23 mL/min — ABNORMAL LOW (ref >60)
Glucose, Bld: 107 mg/dL — ABNORMAL HIGH (ref 70–99)
Phosphorus: 4.1 mg/dL (ref 2.5–4.6)
Potassium: 3.9 mmol/L (ref 3.5–5.1)
Sodium: 131 mmol/L — ABNORMAL LOW (ref 135–145)

## 2022-03-01 LAB — MAGNESIUM: Magnesium: 1.9 mg/dL (ref 1.7–2.4)

## 2022-03-01 MED ORDER — FUROSEMIDE 20 MG PO TABS
10.0000 mg | ORAL_TABLET | Freq: Every day | ORAL | Status: DC
  Administered 2022-03-01 – 2022-03-03 (×3): 10 mg via ORAL
  Filled 2022-03-01 (×3): qty 1

## 2022-03-01 MED ORDER — LACTULOSE 10 GM/15ML PO SOLN
20.0000 g | Freq: Two times a day (BID) | ORAL | Status: DC
  Administered 2022-03-01: 20 g via ORAL
  Filled 2022-03-01 (×2): qty 30

## 2022-03-01 NOTE — TOC Progression Note (Signed)
Transition of Care (TOC) - Progression Note  ? ? ?Patient Details  ?Name: Jayleena Stille ?MRN: [id number] ?Date of Birth: 08/01/1951 ? ?Transition of Care (TOC) CM/SW Contact  ?Emeterio Reeve, LCSW ?Phone Number: ?03/01/2022, 3:07 PM ? ?Clinical Narrative:    ? ?CSW spoke to pts daughter [name]. CSW confirmed that family is wanting LTC at a facility in [place]. [name] confirmed yes. CSW confirmed that [name] understand that insurance does not cover LTC and will be out of pocket. [name] states she understands.  CSW faxed out to facilities in the area ? ?Expected Discharge Plan: [place] ?Barriers to Discharge: Continued Medical Work up, Ship broker ? ?Expected Discharge Plan and Services ?Expected Discharge Plan: [place] ?  ?  ?Post Acute Care Choice: [place] ?Living arrangements for the past 2 months: [place] ?                ?  ?  ?  ?  ?  ?  ?  ?  ?  ?  ? ? ?Social Determinants of Health (SDOH) Interventions ?  ? ?Readmission Risk Interventions ?   ? View : No data to display.  ?  ?  ?  ? ?Emeterio Reeve, LCSW ?Clinical Social Worker ? ?

## 2022-03-01 NOTE — Progress Notes (Signed)
Mobility Specialist Progress Note: ? ? 03/01/22 1400  ?Mobility  ?Activity Transferred from chair to bed  ?Level of Assistance Minimal assist, patient does 75% or more  ?Assistive Device Front wheel walker  ?Distance Ambulated (ft) 4 ft  ?Activity Response Tolerated fair  ?$Mobility charge 1 Mobility  ? ?Pt received in chair asking to go back to bed. MinA+2 to stand from chair, Pt having BM and needed to sit back down. Transferred to [facility] and then required MinA+2 to stand from [facility] for peri-care. Pt began leaning and said she couldn't stand so sat back down on BSC. Pt then able to step to bed. Left in bed with call bell in reach and all needs met.  ? ?Ishika Chesterfield ?Mobility Specialist ?Primary Phone [phone] ? ?

## 2022-03-01 NOTE — Progress Notes (Signed)
? ? ? [facility] ?                                                                            ? ? ?Diane Pruitt, is a 71 y.o. female, DOB - Mar 15, 1951, SWH:[id number] ?Admit date - 02/23/2022    ?Outpatient Primary MD for the patient is [name], MD ? ?LOS - 6  days ? ? ? ?Brief summary  ? ?Diane Pruitt is a 71 y.o. female with medical history significant for depression, paroxysmal atrial fibrillation no longer on Eliquis, history of ischemic stroke, stage 3A CKD, alcoholic liver cirrhosis with complications of hypertensive gastropathy who was admitted to [facility] since 4/25 and transferred to [facility] for IR procedure.   ?Patient was recently admitted to [facility] 4/3 -4/6 for upper GI bleeding, portal hypertensive gastropathy, ascites and was discharged to a skilled nursing facility near [place].  Patient tells me she felt well for a few days, however since then she felt poorly, poor appetite, weakness, dizziness and abdominal distention and diarrhea.   ?On admission to [place] she was found with AKI consistent with hepatorenal syndrome, C. difficile positive diarrhea. ?Paracentesis 4/26 ?IR consulted, noted portal vein thrombosis.  After careful discussion, decided to undergo TIPS procedure hence transferred to [facility]. ?Attempted TIPS and unable to do it.  Remains debilitated ? ? ?Assessment & Plan  ? ? ?Assessment and Plan: ? 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 ?Unable to do TIPS due to difficult anatomy.  Unable to tolerate diuretics due to severe orthostatic vital signs. ?Patient underwent paracentesis on 4/25, 4/29.  ?Patient continues to have abdominal distention. Will plan for paracentesis prior to discharge. ? ? ? ?Acute kidney injury with stage IIIa CKD ?Possible hepatorenal syndrome ?Nephrology on board and appreciate recommendations ?Did not improve  albumin infusions.  ?Creatinine stable around 2.2.  ? ? ?C diff diarrhea:  ?Continue vancomycin to complete 10 day course.  ? ? ? ?Hypothyroidism:  ?On synthroid 200 mcg DAILY.  ? ?Hypomagnesemia:  ?Replaced.  ? ? ?Acute Metabolic encephalopathy/ Mild hepatic encephalopathy:  ?Ammonia levels high , started on lactulose BID.  ? ? ? ?Hyponatremia:  ?From fluid overload and liver failure.  ? ?Hypotension:  ?BP parameters are optimal.  ?On midodrine 14 mg TID.  ? ? ? ?Hyperkalemia:  ?Resolved.  ? ? ?Anemia of chronic disease:  ?- hemoglobin around 8.4.  ? ? ?Thrombocytopenia:  ?Mild, continue to improve.  ?  ? ?Atrial fibrillation paroxysmal  ?- on amiodarone 200 mg daily.  ?Not on anti coagulation.  ? ?Pt continues to decline, family requested to speak to palliative care. Palliative care consulted.  ? ? ?Leukocytosis: ?-  persistent, unclear etiology. ? Reactive. She does not appear toxic.  ? ? ? ?Elevated liver enzymes: ?From Cirrhosis.  ? ? ?RN Pressure Injury Documentation: ?  ? ?Malnutrition Type: ? ?Nutrition Problem: Increased nutrient needs ?Etiology: chronic illness (cirrhosis) ? ? ?Malnutrition Characteristics: ? ?Signs/Symptoms: estimated needs ? ? ?Nutrition Interventions: ? ?Interventions: Refer to RD note for recommendations ? ?Estimated body mass index is 32.58 kg/m? as calculated from the following: ?  Height  as of this encounter: '5\' 2"'$  (1.575 m). ?  Weight as of this encounter: 80.8 kg. ? ?Code Status: DNR ?DVT Prophylaxis:  SCDs Start: 02/23/22 1736 ? ? ?Level of Care: Level of care: Med-Surg ?Family Communication: Updated patient's  ?Disposition Plan:     Remains inpatient appropriate:  clinical decline.  ? ?Procedures:  ?None.  ? ?Consultants:   ?Palliative care  ?Nephrology.  ? ?Antimicrobials:  ? ?Anti-infectives (From admission, onward)  ? ? Start     Dose/Rate Route Frequency Ordered Stop  ? 02/24/22 1000  cefOXitin (MEFOXIN) 2 g in sodium chloride 0.9 % 100 mL IVPB       ? 2 g ?200 mL/hr over 30  Minutes Intravenous  Once 02/24/22 0956 02/24/22 1041  ? 02/23/22 1830  vancomycin (VANCOCIN) capsule 125 mg       ? 125 mg Oral 4 times daily 02/23/22 1735 03/05/22 1759  ? ?  ? ? ? ?Medications ? ?Scheduled Meds: ? (feeding supplement) PROSource Plus  30 mL Oral BID BM  ? allopurinol  200 mg Oral Daily  ? amiodarone  200 mg Oral Daily  ? Chlorhexidine Gluconate Cloth  6 each Topical Daily  ? feeding supplement  1 Container Oral TID BM  ? furosemide  10 mg Oral Daily  ? levothyroxine  200 mcg Oral q morning  ? midodrine  15 mg Oral TID WC  ? multivitamin with minerals  1 tablet Oral Daily  ? pantoprazole  40 mg Oral BID  ? sertraline  50 mg Oral Daily  ? sodium bicarbonate  650 mg Oral TID  ? vancomycin  125 mg Oral QID  ? ?Continuous Infusions: ?PRN Meds:.acetaminophen **OR** acetaminophen, ondansetron **OR** ondansetron (ZOFRAN) IV ? ? ? ?Subjective:  ? ?[name] was seen and examined today. Only one BM since this am.  ?Objective:  ? ?Vitals:  ? 02/28/22 2049 03/01/22 0500 03/01/22 0552 03/01/22 0820  ?BP: (!) 101/54  103/62 (!) 97/59  ?Pulse: 67  66 73  ?Resp:    16  ?Temp: 98.8 ?F (37.1 ?C)  98.7 ?F (37.1 ?C) 98.2 ?F (36.8 ?C)  ?TempSrc: Oral  Oral Oral  ?SpO2: 98%  98%   ?Weight:  80.8 kg    ?Height:      ? ?No intake or output data in the 24 hours ending 03/01/22 1417 ?Filed Weights  ? 02/27/22 0549 02/28/22 0500 03/01/22 0500  ?Weight: 78.8 kg 80.8 kg 80.8 kg  ? ? ? ?Exam ?General exam: Appears calm and comfortable  ?Respiratory system: Clear to auscultation. Respiratory effort normal. ?Cardiovascular system: S1 & S2 heard, RRR. No pedal edema. ?Gastrointestinal system: Abdomen is soft, distended, non tender.  Normal bowel sounds heard. ?Central nervous system: Alert and oriented person and place only. Grossly non focal ?Extremities: no cyanosis.  ?Skin: No rashes,  ?Psychiatry: Mood is appropriate. ? ? ? ?Data Reviewed:  I have personally reviewed following labs and imaging studies ? ? ?CBC ?Lab  Results  ?Component Value Date  ? WBC 16.8 (H) 03/01/2022  ? RBC 3.30 (L) 03/01/2022  ? HGB 8.8 (L) 03/01/2022  ? HCT 26.3 (L) 03/01/2022  ? MCV 79.7 (L) 03/01/2022  ? MCH 26.7 03/01/2022  ? PLT 143 (L) 03/01/2022  ? MCHC 33.5 03/01/2022  ? RDW 21.5 (H) 03/01/2022  ? LYMPHSABS 1.0 03/01/2022  ? MONOABS 0.9 03/01/2022  ? EOSABS 0.2 03/01/2022  ? BASOSABS 0.0 03/01/2022  ? ? ? ?Last metabolic panel ?Lab Results  ?Component Value Date  ?  NA 131 (L) 03/01/2022  ? K 3.9 03/01/2022  ? CL 105 03/01/2022  ? CO2 16 (L) 03/01/2022  ? BUN 50 (H) 03/01/2022  ? CREATININE 2.27 (H) 03/01/2022  ? GLUCOSE 107 (H) 03/01/2022  ? GFRNONAA 23 (L) 03/01/2022  ? GFRAA >60 02/15/2020  ? CALCIUM 8.7 (L) 03/01/2022  ? PHOS 4.1 03/01/2022  ? PROT 5.5 (L) 02/28/2022  ? ALBUMIN 3.3 (L) 03/01/2022  ? BILITOT 2.2 (H) 02/28/2022  ? ALKPHOS 109 02/28/2022  ? AST 182 (H) 02/28/2022  ? ALT 61 (H) 02/28/2022  ? ANIONGAP 10 03/01/2022  ? ? ?CBG (last 3)  ?No results for input(s): GLUCAP in the last 72 hours.  ? ? ?Coagulation Profile: ?Recent Labs  ?Lab 02/23/22 ?4801 02/24/22 ?0026  ?INR 1.5* 1.5*  ? ? ? ? ?Radiology Studies: ?No results found. ? ? ? ? ?Hosie Poisson M.D. ?[facility] ?03/01/2022, 2:17 PM ? ?Available via Epic secure chat 7am-7pm ?After 7 pm, please refer to night coverage provider listed on amion. ? ? ? ?

## 2022-03-01 NOTE — Progress Notes (Signed)
Patient ID: Diane Pruitt, female   DOB: February 18, 1951, 71 y.o.   MRN: [id number] ?[facility] ?Progress Note  ? ?Assessment/ Plan:   ?1. Acute kidney Injury on chronic kidney disease stage IIIa: Her previous work-up and available data so far including poor response to trial of albumin/midodrine pointing more towards hepatorenal syndrome type II.  Unfortunately, management would be entirely supportive at this time with limiting nephrotoxin exposure, hemodynamic fluctuations and cautious diuresis to limit rate of ascites recurrence.  Her blood pressures at this time raise some concerns for the safety of diuretics-we will cautiously start furosemide 10 mg daily with the option to discontinue this if she has worsening hypotension/orthostatic symptoms. ?2.  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  TIPS procedure attempted but unsuccessful due to anatomical challenges and will likely be retried in the near future (on 5/15 as an outpatient). ?3.  Hyponatremia: Acute on chronic secondary to cirrhosis, monitor with loop diuretic. ?4.  Anion gap metabolic acidosis: Likely secondary to cirrhosis and advancing chronic kidney disease.  Started on sodium bicarbonate supplementation. ? ?Nephrology service will sign off at this time and remain available for questions or concerns.  I will set her up for outpatient renal follow-up. ? ?Subjective:   ?Denies any acute events overnight, getting ready to see palliative care service.  ? ?Objective:   ?BP (!) 97/59 (BP Location: Right Arm)   Pulse 73   Temp 98.2 ?F (36.8 ?C) (Oral)   Resp 16   Ht '5\' 2"'$  (1.575 m)   Wt 80.8 kg   SpO2 98%   BMI 32.58 kg/m?  ?No intake or output data in the 24 hours ending 03/01/22 1005 ?Weight change: 0 kg ? ?Physical Exam: ?Gen: Comfortably resting in bed, eating breakfast ?CVS: Pulse regular  rhythm, normal rate, S1 and S2 normal ?Resp: Diminished breath sounds over bases, no distinct rales or rhonchi ?Abd: Soft, mildly distended, bowel sounds normal ?Ext: Trace lower extremity edema ? ?Imaging: ?No results found. ? ?Labs: ?BMET ?Recent Labs  ?Lab 02/24/22 ?0026 02/24/22 ?1325 02/24/22 ?1537 02/25/22 ?4431 02/26/22 ?5400 02/27/22 ?0804 02/28/22 ?8676 02/28/22 ?1200 03/01/22 ?0104  ?NA 132*   < > 136 133* 130* 130* 130* 131* 131*  ?K 3.1*   < > 2.9* 3.9 4.3 4.7 5.6* 4.2 3.9  ?CL 106  --   --  105 102 104 104 104 105  ?CO2 17*  --   --  18* 18* 17* 16* 16* 16*  ?GLUCOSE 95  --   --  86 98 87 108* 110* 107*  ?BUN 33*  --   --  32* 37* 41* 47* 47* 50*  ?CREATININE 2.07*  --   --  2.05* 2.37* 2.31* 2.34* 2.29* 2.27*  ?CALCIUM 7.9*  --   --  7.9* 8.1* 8.5* 8.6* 8.8* 8.7*  ?PHOS  --   --   --   --   --   --   --   --  4.1  ? < > = values in this interval not displayed.  ? ?CBC ?Recent Labs  ?Lab 02/25/22 ?0854 02/26/22 ?0926 02/27/22 ?0804 02/28/22 ?1200 03/01/22 ?0104  ?WBC 15.8* 17.3* 18.0* 16.8* 16.8*  ?NEUTROABS 13.5* 14.8* 15.1*  --  14.4*  ?HGB 9.0* 9.2* 9.3* 8.4* 8.8*  ?HCT 27.5* 28.7* 27.7* 25.6* 26.3*  ?MCV 81.4 81.1 80.1 80.5 79.7*  ?PLT 134* 160 132* 142* 143*  ? ? ?Medications:   ? ? (  feeding supplement) PROSource Plus  30 mL Oral BID BM  ? allopurinol  200 mg Oral Daily  ? amiodarone  200 mg Oral Daily  ? Chlorhexidine Gluconate Cloth  6 each Topical Daily  ? feeding supplement  1 Container Oral TID BM  ? levothyroxine  200 mcg Oral q morning  ? midodrine  15 mg Oral TID WC  ? multivitamin with minerals  1 tablet Oral Daily  ? pantoprazole  40 mg Oral BID  ? sertraline  50 mg Oral Daily  ? sodium bicarbonate  650 mg Oral TID  ? vancomycin  125 mg Oral QID  ? ?Elmarie Shiley, MD ?03/01/2022, 10:05 AM  ? ?

## 2022-03-01 NOTE — Progress Notes (Signed)
? ?                                                                                                                                                     ?                                                   ?Daily Progress Note  ? ?Patient Name: Diane Pruitt       Date: 03/01/2022 ?DOB: Jan 07, 1951  Age: 71 y.o. MRN#: [id number] ?Attending Physician: Hosie Poisson, MD ?Primary Care Physician: [name], MD ?Admit Date: 02/23/2022 ? ?Reason for Consultation/Follow-up: Establishing goals of care ? ?Patient Profile/HPI:   Palliative Care consult requested for goals of care discussion in this 71 y.o. female  with past medical history of atrial fibrillation (Eliqu[place]), CKD 3, depression, ischemic stroke, alcoholic cirrhosis, and hypertension. She was admitted initially on 4/25 at [facility] due to weakness and recurrent ascites. Transferred to [facility] on 02/23/2022 for IR TIPS procedure. Recent hospitalization where she was treated for upper GI bleed and discharged on 02/01/22 to SNF. TIPS was unable to be completed. 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  ? ?Subjective: ?Chart reviewed including progress notes, labs, imaging. Received notice that patient's daughter was asking about Hospice options.  ?Met first with patient- she tells me she understands her body is in decline and she is going to eventually die from her illness. She is afraid to die- she doesn't offer more information when given space to share her emotions. She is unable to share any hopes that she has for the time of life she has. ?Met with Diane Pruitt separately. [name] feels her mom is declining- she has seen great decline in the last few weeks. [name] understandably does not feel that her mom will benefit from rehab. ?We discussed hospice including philosophy, services and venues where hospice can be provided.  ?We then met with Diane Pruitt to further  discuss a plan for her care. All of the above was reviewed.  ?Diane Pruitt was very emotional during the conversation and unable to offer her opinion regarding her options- going to rehab with Palliative in place and transition to Hospice at a later point, going to long term care with hospice. ?I left to give them time to discuss. ?I returned later and Diane Pruitt shared with me the decision to go to long term care. I called [name] to clarify and [name] shared decision to go to long term care- not rehab- without hospice- she understands that she and family will need to private pay for a long term care.  ?  ?  Review of Systems  ?Constitutional:  Positive for malaise/fatigue.  ?Respiratory: Negative.    ? ? ?Physical Exam ?Vitals and nursing note reviewed.  ?Constitutional:   ?   Appearance: She is ill-appearing.  ?Pulmonary:  ?   Effort: Pulmonary effort is normal.  ?Abdominal:  ?   General: There is distension.  ?Neurological:  ?   Mental Status: She is alert and oriented to person, place, and time.  ?Psychiatric:  ?   Comments: labile  ?         ? ?Vital Signs: BP (!) 97/59 (BP Location: Right Arm)   Pulse 73   Temp 98.2 ?F (36.8 ?C) (Oral)   Resp 16   Ht '5\' 2"'  (1.575 m)   Wt 80.8 kg   SpO2 98%   BMI 32.58 kg/m?  ?SpO2: SpO2: 98 % ?O2 Device: O2 Device: Nasal Cannula ?O2 Flow Rate: O2 Flow Rate (L/min): 2 L/min ? ?Intake/output summary: No intake or output data in the 24 hours ending 03/01/22 1059 ?LBM: Last BM Date : 02/28/22 ?Baseline Weight: Weight: 77 kg ?Most recent weight: Weight: 80.8 kg ? ?     ?Palliative Assessment/Data: PPS: 20% ? ? ? ? ? ?Patient Active Problem List  ? Diagnosis Date Noted  ? Hyperkalemia 02/22/2022  ? Abnormal urinalysis 02/22/2022  ? Portal vein thrombosis 02/22/2022  ? Ascites due to alcoholic cirrhosis (HCC)   ? Clostridium difficile infection 02/21/2022  ? 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 Orthostatic hypotension with weakness and falls  01/29/2022  ? Hyponatremia  01/29/2022  ? Weakness 01/29/2022  ? Portal hypertensive gastropathy ([place]) 01/25/2022  ? Hyperlipidemia 01/21/2022  ? Hypokalemia 01/06/2022  ? Acute blood loss anemia 01/04/2022  ? 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 AKI (acute kidney injury) ([place]) 01/04/2022  ? Elevated LFTs 01/04/2022  ? Gout 01/04/2022  ? Depression 01/04/2022  ? GI bleed 01/04/2022  ? Iron deficiency anemia due to chronic blood loss 01/03/2022  ? Unspecified abnormal finding in specimens from other organs, systems and tissues 11/20/2021  ? Age-related osteoporosis without current pathological fracture 11/01/2021  ? Chronic gouty arthritis 11/01/2021  ? Excessive sleepiness 11/01/2021  ? History of embolic stroke 62/37/6283  ? History of tobacco use 11/01/2021  ? Hypercoagulable state ([place]) 11/01/2021  ? Iron deficiency anemia 11/01/2021  ? Obesity due to excess calories 11/01/2021  ? Personal history of colonic polyps 11/01/2021  ? Polyp of colon 11/01/2021  ? Severe major depression, single episode, without psychotic features ([place]) 11/01/2021  ? Acquired hypothyroidism 02/12/2020  ? Compression fracture of L2 ([place]) 02/01/2020  ? Low back pain 01/18/2020  ? Pain of right hip joint 01/18/2020  ? Paroxysmal atrial fibrillation ([place]) 01/25/2018  ? Cyst of finger 01/23/2018  ? ? ?Palliative Care Assessment & Plan  ? ? ?Assessment/Recommendations/Plan ? ?Continue current medical care ?Patient and family would like patient to discharge to a long term care facility without hospice, would like to continue to check her labs and hopeful that TIPS may be performed in the future- TOC notified ?Recommend outpatient Palliative to follow ? ? ?Code Status: ?DNR ? ?Prognosis: ? < 3 months ? ?Discharge Planning: ?Long term care facility ? ?Care plan was discussed with patient and her daughter.  ? ?Thank you for allowing the Palliative Medicine Team to assist in the care of this patient. ? ?Total time:  120 minutes ? ? ?    ?Greater than 50%  of this  time was spent counseling and coordinating care related to the above assessment and plan. ? ?Mariana Kaufman, AGNP-C ?Palliative Medicine ? ? ?Please contact Palliative Medicine Team phone at [phone] for questions and concerns.  ? ? ? ? ? ? ?

## 2022-03-01 NOTE — Progress Notes (Signed)
Occupational Therapy Treatment ?Patient Details ?Name: Diane Pruitt ?MRN: [id number] ?DOB: 11/06/1950 ?Today's Date: 03/01/2022 ? ? ?History of present illness The pt is a 71 yo female presenitng 4/28 from [facility] for TIPS procedure on 4/29. The pt had been admitted 4/25 - 4/28 due to hypertensive gastropathy. Of note, pt also admitted 4/3-4/6 for upper GI bleed and was d/c to SNF. PMH includes: depression, afib, ischemic stroke, CKD III, and alcoholic liver cirrhosis. ?  ?OT comments ? Patietn supine in bed when therapist entered room and daughter present. Patient's affect is mostly flat and she is at times teary but does not elaborate when questioned. She is agreeable to treatment after encouragement and conversation. She is mod assist to transfer to edge of bed needing most assist for trunk and scooting forward. She is min assist to stand from bed holding onto walker but wanting her daughter close as well. She was found to be incontinent of BM and was able to stand for approx 20 seconds for perianal care and then steps to recliner. She is fearful. She is positioned in recliner for comfort but is not agreeable for an ADL task. She lays her head back and closes her eyes. Daughter and patient mostly quiet. It is noted that daughter requested palliative consult. When asked about POC - they did not elaborate. Will continue to follow acutely.    ? ?Recommendations for follow up therapy are one component of a multi-disciplinary discharge planning process, led by the attending physician.  Recommendations may be updated based on patient status, additional functional criteria and insurance authorization. ?   ?Follow Up Recommendations ? Skilled nursing-short term rehab (<3 hours/day)  ?  ?Assistance Recommended at Discharge Frequent or constant Supervision/Assistance  ?Patient can return home with the following ? A lot of help with walking and/or transfers;A lot of help with bathing/dressing/bathroom;Assistance  with cooking/housework;Assist for transportation;Help with stairs or ramp for entrance ?  ?Equipment Recommendations ? Other (comment) (defer to next venue)  ?  ?Recommendations for Other Services   ? ?  ?Precautions / Restrictions Precautions ?Precautions: Fall;Other (comment) ?Precaution Comments: watch soft BP ?Restrictions ?Weight Bearing Restrictions: No  ? ? ?  ? ?Mobility Bed Mobility ?  ?  ?  ?  ?  ?  ?  ?  ?  ? ?Transfers ?  ?  ?  ?  ?  ?  ?  ?  ?  ?  ?  ?  ?Balance Overall balance assessment: Needs assistance ?Sitting-balance support: Feet supported, No upper extremity supported ?Sitting balance-Leahy Scale: Fair ?  ?  ?Standing balance support: Bilateral upper extremity supported, Reliant on assistive device for balance ?Standing balance-Leahy Scale: Poor ?Standing balance comment: reliant on external assist ?  ?  ?  ?  ?  ?  ?  ?  ?  ?  ?  ?   ? ?ADL either performed or assessed with clinical judgement  ? ?ADL Overall ADL's : Needs assistance/impaired ?  ?  ?  ?  ?  ?  ?  ?  ?  ?  ?  ?  ?  ?  ?Toileting- Clothing Manipulation and Hygiene: Total assistance;Sit to/from stand ?Toileting - Clothing Manipulation Details (indicate cue type and reason): patietnt found to be incontinent of BM, total assist to clean perianal area while patient stood and held onto walker. Able to stand for approx 20 seconds ?  ?  ?Functional mobility during ADLs: Minimal assistance;Rolling walker (2 wheels) ?General ADL Comments:  Min assist for sit to stand from bed height with use of walker. Stood approx 20 seconds for perianal care and taking steps to recliner. Patient wanted daughter close as wel due to fear. ?  ? ?Extremity/Trunk Assessment Upper Extremity Assessment ?Upper Extremity Assessment: Generalized weakness ?  ?Lower Extremity Assessment ?Lower Extremity Assessment: Defer to PT evaluation ?  ?Cervical / Trunk Assessment ?Cervical / Trunk Assessment: Kyphotic ?  ? ?Vision   ?  ?  ?Perception   ?  ?Praxis   ?   ? ?Cognition Arousal/Alertness: Awake/alert ?Behavior During Therapy: Flat affect ?Overall Cognitive Status: Within Functional Limits for tasks assessed ?  ?  ?  ?  ?  ?  ?  ?  ?  ?  ?  ?  ?  ?  ?  ?  ?General Comments: Teary eyed but unable to state why ?  ?  ?   ?Exercises   ? ?  ?Shoulder Instructions   ? ? ?  ?General Comments    ? ? ?Pertinent Vitals/ Pain       Pain Assessment ?Pain Assessment: Faces ?Faces Pain Scale: Hurts little more ?Pain Location: gluteal cleft - excoritation ?Pain Descriptors / Indicators: Burning, Grimacing ?Pain Intervention(s): Limited activity within patient's tolerance ? ?Home Living   ?  ?  ?  ?  ?  ?  ?  ?  ?  ?  ?  ?  ?  ?  ?  ?  ?  ?  ? ?  ?Prior Functioning/Environment    ?  ?  ?  ?   ? ?Frequency ? Min 2X/week  ? ? ? ? ?  ?Progress Toward Goals ? ?OT Goals(current goals can now be found in the care plan section) ? Progress towards OT goals: Progressing toward goals ? ?Acute Rehab OT Goals ?Patient Stated Goal: did not state ?Time For Goal Achievement: 03/10/22 ?Potential to Achieve Goals: Fair  ?Plan Discharge plan remains appropriate   ? ?Co-evaluation ? ? ?   ?  ?  ?OT goals addressed during session:  (functional mobiliy) ?  ? ?  ?AM-PAC OT "6 Clicks" Daily Activity     ?Outcome Measure ? ? Help from another person eating meals?: A Little ?Help from another person taking care of personal grooming?: A Little ?Help from another person toileting, which includes using toliet, bedpan, or urinal?: Total ?Help from another person bathing (including washing, rinsing, drying)?: A Lot ?Help from another person to put on and taking off regular upper body clothing?: A Little ?Help from another person to put on and taking off regular lower body clothing?: Total ?6 Click Score: 13 ? ?  ?End of Session Equipment Utilized During Treatment: Rolling walker (2 wheels);Gait belt ? ?OT Visit Diagnosis: Other abnormalities of gait and mobility (R26.89);Muscle weakness (generalized)  (M62.81);Dizziness and giddiness (R42);History of falling (Z91.81) ?  ?Activity Tolerance Patient limited by fatigue ?  ?Patient Left with call bell/phone within reach;in chair;with family/visitor present ?  ?Nurse Communication Mobility status ?  ? ?   ? ?Time: 4665-9935 ?OT Time Calculation (min): 18 min ? ?Charges: OT General Charges ?$OT Visit: 1 Visit ?OT Treatments ?$Therapeutic Activity: 8-22 mins ? ?Jazlen Ogarro, OTR/L ?Acute Care Rehab Services  ?Office [phone] ?Pager: [phone]  ? ?Madisen Ludvigsen L Meshawn Oconnor ?03/01/2022, 11:43 AM ?

## 2022-03-02 LAB — COMPREHENSIVE METABOLIC PANEL
ALT: 59 U/L — ABNORMAL HIGH (ref 0–44)
AST: 152 U/L — ABNORMAL HIGH (ref 15–41)
Albumin: 3.1 g/dL — ABNORMAL LOW (ref 3.5–5.0)
Alkaline Phosphatase: 112 U/L (ref 38–126)
Anion gap: 14 (ref 5–15)
BUN: 49 mg/dL — ABNORMAL HIGH (ref 8–23)
CO2: 14 mmol/L — ABNORMAL LOW (ref 22–32)
Calcium: 8.6 mg/dL — ABNORMAL LOW (ref 8.9–10.3)
Chloride: 102 mmol/L (ref 98–111)
Creatinine, Ser: 2.43 mg/dL — ABNORMAL HIGH (ref 0.44–1.00)
GFR, Estimated: 21 mL/min — ABNORMAL LOW (ref >60)
Glucose, Bld: 94 mg/dL (ref 70–99)
Potassium: 3.7 mmol/L (ref 3.5–5.1)
Sodium: 130 mmol/L — ABNORMAL LOW (ref 135–145)
Total Bilirubin: 2.4 mg/dL — ABNORMAL HIGH (ref 0.3–1.2)
Total Protein: 5.8 g/dL — ABNORMAL LOW (ref 6.5–8.1)

## 2022-03-02 LAB — CBC WITH DIFFERENTIAL/PLATELET
Abs Immature Granulocytes: 0.31 10*3/uL — ABNORMAL HIGH (ref 0.00–0.07)
Basophils Absolute: 0.1 10*3/uL (ref 0.0–0.1)
Basophils Relative: 0 %
Eosinophils Absolute: 0.2 10*3/uL (ref 0.0–0.5)
Eosinophils Relative: 1 %
HCT: 25.9 % — ABNORMAL LOW (ref 36.0–46.0)
Hemoglobin: 8.8 g/dL — ABNORMAL LOW (ref 12.0–15.0)
Immature Granulocytes: 1 %
Lymphocytes Relative: 6 %
Lymphs Abs: 1.3 10*3/uL (ref 0.7–4.0)
MCH: 26.5 pg (ref 26.0–34.0)
MCHC: 34 g/dL (ref 30.0–36.0)
MCV: 78 fL — ABNORMAL LOW (ref 80.0–100.0)
Monocytes Absolute: 1 10*3/uL (ref 0.1–1.0)
Monocytes Relative: 5 %
Neutro Abs: 18.6 10*3/uL — ABNORMAL HIGH (ref 1.7–7.7)
Neutrophils Relative %: 87 %
Platelets: 149 10*3/uL — ABNORMAL LOW (ref 150–400)
RBC: 3.32 MIL/uL — ABNORMAL LOW (ref 3.87–5.11)
RDW: 21.6 % — ABNORMAL HIGH (ref 11.5–15.5)
WBC: 21.6 10*3/uL — ABNORMAL HIGH (ref 4.0–10.5)
nRBC: 0 % (ref 0.0–0.2)

## 2022-03-02 MED ORDER — GERHARDT'S BUTT CREAM
TOPICAL_CREAM | Freq: Three times a day (TID) | CUTANEOUS | Status: DC
  Administered 2022-03-03: 1 via TOPICAL
  Filled 2022-03-02 (×2): qty 1

## 2022-03-02 NOTE — Plan of Care (Signed)
?  Problem: Education: ?Goal: Knowledge of General Education information will improve ?Description: Including pain rating scale, medication(s)/side effects and non-pharmacologic comfort measures ?Outcome: Progressing ?  ?Problem: Clinical Measurements: ?Goal: Ability to maintain clinical measurements within normal limits will improve ?Outcome: Progressing ?Goal: Will remain free from infection ?Outcome: Progressing ?Goal: Respiratory complications will improve ?Outcome: Progressing ?  ?Problem: Elimination: ?Goal: Will not experience complications related to bowel motility ?Outcome: Progressing ?  ?

## 2022-03-02 NOTE — Progress Notes (Signed)
Palliative-  ? ?Patient resting.  ?Daughter, [name] at bedside.  ?She was able to speak with hospice representative and wishes for her mom to have hospice services at discharge. She is waiting to hear from social work. No current needs. Will notify TOC.  ? ?Mariana Kaufman, AGNP-C ?Palliative Medicine ? ?No charge ?

## 2022-03-02 NOTE — TOC Progression Note (Signed)
Transition of Care (TOC) - Progression Note  ? ? ?Patient Details  ?Name: Diane Pruitt ?MRN: [id number] ?Date of Birth: 1951/02/03 ? ?Transition of Care (TOC) CM/SW Contact  ?Emeterio Reeve, LCSW ?Phone Number: ?03/02/2022, 3:42 PM ? ?Clinical Narrative:    ? ?CSW gave bed offers to pt and daughter at bedside. Daughter is not happy with offers. CSW will check in with daughter tomorrow. ? ?Expected Discharge Plan: [place] ?Barriers to Discharge: Continued Medical Work up, Ship broker ? ?Expected Discharge Plan and Services ?Expected Discharge Plan: [place] ?  ?  ?Post Acute Care Choice: [place] ?Living arrangements for the past 2 months: [place] ?                ?  ?  ?  ?  ?  ?  ?  ?  ?  ?  ? ? ?Social Determinants of Health (SDOH) Interventions ?  ? ?Readmission Risk Interventions ?   ? View : No data to display.  ?  ?  ?  ? ?Emeterio Reeve, LCSW ?Clinical Social Worker ? ?

## 2022-03-02 NOTE — Progress Notes (Signed)
? ? ? [facility] ?                                                                            ? ? ?Diane Pruitt, is a 71 y.o. female, DOB - 1951-08-04, GGE:[id number] ?Admit date - 02/23/2022    ?Outpatient Primary MD for the patient is [name], MD ? ?LOS - 7  days ? ? ? ?Brief summary  ? ?Diane Pruitt is a 71 y.o. female with medical history significant for depression, paroxysmal atrial fibrillation no longer on Eliquis, history of ischemic stroke, stage 3A CKD, alcoholic liver cirrhosis with complications of hypertensive gastropathy who was admitted to [facility] since 4/25 and transferred to [facility] for IR procedure.   ?Patient was recently admitted to [facility] 4/3 -4/6 for upper GI bleeding, portal hypertensive gastropathy, ascites and was discharged to a skilled nursing facility near [place].  Patient tells me she felt well for a few days, however since then she felt poorly, poor appetite, weakness, dizziness and abdominal distention and diarrhea.   ?On admission to [place] she was found with AKI consistent with hepatorenal syndrome, C. difficile positive diarrhea. ?Paracentesis 4/26 ?IR consulted, noted portal vein thrombosis.  After careful discussion, decided to undergo TIPS procedure hence transferred to [facility] ?Attempted TIPS and unable to do it.  Remains debilitated.  ? ? ?Assessment & Plan  ? ? ?Assessment and Plan: ? 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 ?Unable to do TIPS due to difficult anatomy.  Unable to tolerate diuretics due to severe orthostatic vital signs. ?Patient underwent paracentesis on 4/25, 4/29.  ?Patient continues to have abdominal distention.  ?Will plan for paracentesis prior to discharge. Currently waiting for SNF with hospice.  ? ? ? ?Acute kidney injury with stage IIIa CKD and Non anion gap metabolic acidosis.  ?Possible  hepatorenal syndrome ?Nephrology on board and appreciate recommendations ?Did not improve albumin infusions. Slight worsening of creatinine with lasix 10 mg . If creatinine continues to worsen will discontinue lasix.  ? ? ?C diff colitis:  ?Continue vancomycin to complete 10 day course.  ? ? ?Hypothyroidism:  ?On synthroid 200 mcg DAILY.  ? ?Hypomagnesemia:  ?Replaced. Repeat level snl.  ? ? ?Acute Metabolic encephalopathy/ Mild hepatic encephalopathy:  ?Ammonia levels at 46, slightly high, ordered lactulose with bowel movements . ?recheck ammonia level in am.  ? ? ?Hyponatremia:  ?From fluid overload and liver failure. Stable around 130.  ? ?Hypotension:  ?On midodrine three times a day.  ?BP parameters are optimal.  ? ? ? ?Hyperkalemia:  ?Resolved.  ? ? ?Anemia of chronic disease:  ?- hemoglobin around 8.4.  ? ? ?Thrombocytopenia:  ?Mild, continue to improve.  ?  ? ?Atrial fibrillation paroxysmal  ?- on amiodarone 200 mg daily.  ?Not on anti coagulation.  ? ?Pt continues to decline, family requested to speak to palliative care. Palliative care consulted. Plan for SNF with hospice services.  ? ? ?Leukocytosis: ?-  persistent, unclear etiology. ? Reactive. She does not appear toxic.  ?- will check pro calcitonin in am.  ? ? ?Elevated liver enzymes: ?From Cirrhosis.  ? ?  ? ?  Malnutrition Type: ? ?Nutrition Problem: Increased nutrient needs ?Etiology: chronic illness (cirrhosis) ? ? ?Malnutrition Characteristics: ? ?Signs/Symptoms: estimated needs ? ? ?Nutrition Interventions: ? ?Interventions: Refer to RD note for recommendations ? ?Estimated body mass index is 32.58 kg/m? as calculated from the following: ?  Height as of this encounter: '5\' 2"'$  (1.575 m). ?  Weight as of this encounter: 80.8 kg. ? ?Code Status: DNR ?DVT Prophylaxis:  SCDs Start: 02/23/22 1736 ? ? ?Level of Care: Level of care: Med-Surg ?Family Communication: Updated patient's daughter at bedside.  ?Disposition Plan:     Remains inpatient appropriate:   currently waiting for SNF for discharge.  ? ?Procedures:  ?US paracentesis.  ? ?Consultants:   ?Palliative care  ?Nephrology.  ? ?Antimicrobials:  ? ?Anti-infectives (From admission, onward)  ? ? Start     Dose/Rate Route Frequency Ordered Stop  ? 02/24/22 1000  cefOXitin (MEFOXIN) 2 g in sodium chloride 0.9 % 100 mL IVPB       ? 2 g ?200 mL/hr over 30 Minutes Intravenous  Once 02/24/22 0956 02/24/22 1041  ? 02/23/22 1830  vancomycin (VANCOCIN) capsule 125 mg       ? 125 mg Oral 4 times daily 02/23/22 1735 03/05/22 1759  ? ?  ? ? ? ?Medications ? ?Scheduled Meds: ? (feeding supplement) PROSource Plus  30 mL Oral BID BM  ? allopurinol  200 mg Oral Daily  ? amiodarone  200 mg Oral Daily  ? Chlorhexidine Gluconate Cloth  6 each Topical Daily  ? feeding supplement  1 Container Oral TID BM  ? furosemide  10 mg Oral Daily  ? Gerhardt's butt cream   Topical TID  ? levothyroxine  200 mcg Oral q morning  ? midodrine  15 mg Oral TID WC  ? multivitamin with minerals  1 tablet Oral Daily  ? pantoprazole  40 mg Oral BID  ? sertraline  50 mg Oral Daily  ? sodium bicarbonate  650 mg Oral TID  ? vancomycin  125 mg Oral QID  ? ?Continuous Infusions: ?PRN Meds:.acetaminophen **OR** acetaminophen, ondansetron **OR** ondansetron (ZOFRAN) IV ? ? ? ?Subjective:  ? ?Zhara Gieske was seen and examined today. She is requesting the lactulose to be stopped.  ? ?Objective:  ? ?Vitals:  ? 03/01/22 2007 03/02/22 4081 03/02/22 0742 03/02/22 1620  ?BP: (!) 108/48 (!) 94/51 106/68 (!) 106/54  ?Pulse: 70 66 66 69  ?Resp: '17  17 18  '$ ?Temp: 98.2 ?F (36.8 ?C) 98.3 ?F (36.8 ?C) 98.2 ?F (36.8 ?C) 98.1 ?F (36.7 ?C)  ?TempSrc: Oral Oral Oral Oral  ?SpO2: 94% 96% 95% 93%  ?Weight:      ?Height:      ? ? ?Intake/Output Summary (Last 24 hours) at 03/02/2022 1642 ?Last data filed at 03/02/2022 1315 ?Gross per 24 hour  ?Intake 150 ml  ?Output --  ?Net 150 ml  ? ?Filed Weights  ? 02/27/22 0549 02/28/22 0500 03/01/22 0500  ?Weight: 78.8 kg 80.8 kg 80.8 kg   ? ? ? ?Exam ?General exam: ill appearing elderly woman, not in distress.  ?Respiratory system: Clear to auscultation. Respiratory effort normal. ?Cardiovascular system: S1 & S2 heard, RRR. No JVD,  ?Gastrointestinal system: Abdomen is distended , soft,  Normal bowel sounds heard. ?Central nervous system: Alert and oriented.following commands.  ?Extremities: no cyanosis.  ?Skin: No rashes, lesions or ulcers ?Psychiatry: flat affect.  ? ? ? ?Data Reviewed:  I have personally reviewed following labs and imaging studies ? ? ?CBC ?Lab  Results  ?Component Value Date  ? WBC 21.6 (H) 03/02/2022  ? RBC 3.32 (L) 03/02/2022  ? HGB 8.8 (L) 03/02/2022  ? HCT 25.9 (L) 03/02/2022  ? MCV 78.0 (L) 03/02/2022  ? MCH 26.5 03/02/2022  ? PLT 149 (L) 03/02/2022  ? MCHC 34.0 03/02/2022  ? RDW 21.6 (H) 03/02/2022  ? LYMPHSABS 1.3 03/02/2022  ? MONOABS 1.0 03/02/2022  ? EOSABS 0.2 03/02/2022  ? BASOSABS 0.1 03/02/2022  ? ? ? ?Last metabolic panel ?Lab Results  ?Component Value Date  ? NA 130 (L) 03/02/2022  ? K 3.7 03/02/2022  ? CL 102 03/02/2022  ? CO2 14 (L) 03/02/2022  ? BUN 49 (H) 03/02/2022  ? CREATININE 2.43 (H) 03/02/2022  ? GLUCOSE 94 03/02/2022  ? GFRNONAA 21 (L) 03/02/2022  ? GFRAA >60 02/15/2020  ? CALCIUM 8.6 (L) 03/02/2022  ? PHOS 4.1 03/01/2022  ? PROT 5.8 (L) 03/02/2022  ? ALBUMIN 3.1 (L) 03/02/2022  ? BILITOT 2.4 (H) 03/02/2022  ? ALKPHOS 112 03/02/2022  ? AST 152 (H) 03/02/2022  ? ALT 59 (H) 03/02/2022  ? ANIONGAP 14 03/02/2022  ? ? ?CBG (last 3)  ?No results for input(s): GLUCAP in the last 72 hours.  ? ? ?Coagulation Profile: ?Recent Labs  ?Lab 02/24/22 ?0026  ?INR 1.5*  ? ? ? ? ?Radiology Studies: ?No results found. ? ? ? ? ?Hosie Poisson M.D. ?[facility] ?03/02/2022, 4:42 PM ? ?Available via Epic secure chat 7am-7pm ?After 7 pm, please refer to night coverage provider listed on amion. ? ? ? ?

## 2022-03-02 NOTE — Progress Notes (Signed)
PT Cancellation Note ? ?Patient Details ?Name: Diane Pruitt ?MRN: [id number] ?DOB: Dec 29, 1950 ? ? ?Cancelled Treatment:    Reason Eval/Treat Not Completed: Other (comment); patient eating breakfast and non-committal about working with me later.  Will attempt again, however, as schedule permits.  ? ? ?[name] ?03/02/2022, 9:59 AM ?Magda Kiel, PT ?Acute Rehabilitation Services ?TCKFW:[phone] ?[phone] ?03/02/2022 ? ?

## 2022-03-03 ENCOUNTER — Inpatient Hospital Stay (HOSPITAL_COMMUNITY): Payer: Medicare HMO

## 2022-03-03 LAB — BASIC METABOLIC PANEL
Anion gap: 10 (ref 5–15)
BUN: 54 mg/dL — ABNORMAL HIGH (ref 8–23)
CO2: 17 mmol/L — ABNORMAL LOW (ref 22–32)
Calcium: 8.5 mg/dL — ABNORMAL LOW (ref 8.9–10.3)
Chloride: 105 mmol/L (ref 98–111)
Creatinine, Ser: 2.57 mg/dL — ABNORMAL HIGH (ref 0.44–1.00)
GFR, Estimated: 20 mL/min — ABNORMAL LOW (ref >60)
Glucose, Bld: 118 mg/dL — ABNORMAL HIGH (ref 70–99)
Potassium: 3.5 mmol/L (ref 3.5–5.1)
Sodium: 132 mmol/L — ABNORMAL LOW (ref 135–145)

## 2022-03-03 LAB — CBC WITH DIFFERENTIAL/PLATELET
Abs Immature Granulocytes: 0.39 10*3/uL — ABNORMAL HIGH (ref 0.00–0.07)
Basophils Absolute: 0.1 10*3/uL (ref 0.0–0.1)
Basophils Relative: 0 %
Eosinophils Absolute: 0.3 10*3/uL (ref 0.0–0.5)
Eosinophils Relative: 1 %
HCT: 26.4 % — ABNORMAL LOW (ref 36.0–46.0)
Hemoglobin: 8.9 g/dL — ABNORMAL LOW (ref 12.0–15.0)
Immature Granulocytes: 2 %
Lymphocytes Relative: 5 %
Lymphs Abs: 1.3 10*3/uL (ref 0.7–4.0)
MCH: 26.6 pg (ref 26.0–34.0)
MCHC: 33.7 g/dL (ref 30.0–36.0)
MCV: 78.8 fL — ABNORMAL LOW (ref 80.0–100.0)
Monocytes Absolute: 1.1 10*3/uL — ABNORMAL HIGH (ref 0.1–1.0)
Monocytes Relative: 5 %
Neutro Abs: 21 10*3/uL — ABNORMAL HIGH (ref 1.7–7.7)
Neutrophils Relative %: 87 %
Platelets: 155 10*3/uL (ref 150–400)
RBC: 3.35 MIL/uL — ABNORMAL LOW (ref 3.87–5.11)
RDW: 21.8 % — ABNORMAL HIGH (ref 11.5–15.5)
WBC: 24.1 10*3/uL — ABNORMAL HIGH (ref 4.0–10.5)
nRBC: 0 % (ref 0.0–0.2)

## 2022-03-03 LAB — HEPATIC FUNCTION PANEL
ALT: 58 U/L — ABNORMAL HIGH (ref 0–44)
AST: 144 U/L — ABNORMAL HIGH (ref 15–41)
Albumin: 3 g/dL — ABNORMAL LOW (ref 3.5–5.0)
Alkaline Phosphatase: 107 U/L (ref 38–126)
Bilirubin, Direct: 0.7 mg/dL — ABNORMAL HIGH (ref 0.0–0.2)
Indirect Bilirubin: 1.3 mg/dL — ABNORMAL HIGH (ref 0.3–0.9)
Total Bilirubin: 2 mg/dL — ABNORMAL HIGH (ref 0.3–1.2)
Total Protein: 5.5 g/dL — ABNORMAL LOW (ref 6.5–8.1)

## 2022-03-03 LAB — AMMONIA: Ammonia: 32 umol/L (ref 9–35)

## 2022-03-03 LAB — PROCALCITONIN: Procalcitonin: 0.85 ng/mL

## 2022-03-03 NOTE — Progress Notes (Signed)
? ? ? [facility] ?                                                                            ? ? ?Diane Pruitt, is a 71 y.o. female, DOB - 1950/11/07, EXN:[id number] ?Admit date - 02/23/2022    ?Outpatient Primary MD for the patient is [name], MD ? ?LOS - 8  days ? ? ? ?Brief summary  ? ?Diane Pruitt is a 71 y.o. 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   ?Patient was recently admitted to [facility] 4/3 -4/6 for upper GI bleeding, portal hypertensive gastropathy, ascites and was discharged to a skilled nursing facility near [place].  Patient tells me she felt well for a few days, however since then she felt poorly, poor appetite, weakness, dizziness and abdominal distention and diarrhea.   ?On admission to [place] she was found with AKI consistent with hepatorenal syndrome, C. difficile positive diarrhea. ?Paracentesis 4/26 ?IR consulted, noted portal vein thrombosis.  After careful discussion, decided to undergo TIPS procedure hence transferred to [facility]. ?Attempted TIPS and unable to do it.  Remains debilitated. After further discussions with palliative care and TOC, patient and family decided to plan for SNF with hospice.  ? ? ?Assessment & Plan  ? ? ?Assessment and Plan: ? 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 ?Unable to do TIPS due to difficult anatomy.  Unable to tolerate diuretics due to severe orthostatic vital signs. ?Patient underwent paracentesis on 4/25, 4/29.  ? Currently waiting for SNF with hospice.  ? ? ? ?Acute kidney injury with stage IIIa CKD and Non anion gap metabolic acidosis.   ?Possible hepatorenal syndrome ?Nephrology on board and appreciate recommendations ?Did not improve albumin infusions. Slight worsening of creatinine with lasix 10 mg .  ?Creatinine continues to worsen , hence will discontinue Lasix.  ? ? ?C diff colitis:  ?Continue vancomycin to complete 10 day course.  ?Patient continues tto have bowel movements. 3 so far.  ? ? ?Hypothyroidism:  ?On synthroid 200 mcg daily.  ? ?Hypomagnesemia:  ?Replaced. Repeat level snl.  ? ? ?Acute Metabolic encephalopathy/ Mild hepatic encephalopathy:  ?Ammonia levels at 46, slightly high, ordered lactulose with bowel movements . ?Repeat level is wnl. She is more alert and answering all questions appropriately.  ? ? ?Hyponatremia:  ?From fluid overload and liver failure. Stable around 130.  ? ?Hypotension:  ?On midodrine three times a day.  ?BP parameters are optimal.  ? ? ?Hyperkalemia:  ?Resolved.  ? ? ?Anemia of chronic disease:  ?- hemoglobin around 8.4.  ? ? ?Thrombocytopenia:  ?Mild, continue to improve.  ?  ? ?Atrial fibrillation paroxysmal  ?- on amiodarone 200 mg daily.  ?Not on anti coagulation.  ? ?Pt continues to decline, family requested to speak to palliative care. Palliative care consulted. Plan for SNF with hospice services.  ?Toc on board.  ? ? ?Leukocytosis: ?-  persistent,  unclear etiology.  She does not appear toxic.  ?- Pro calcitonin is 0.85. ?- no urinary symptoms.  ?- UA is negative.  ?- CXR is negative for infection.  ?- no abdominal tenderness.  ? ? ?Elevated liver enzymes: ?From Cirrhosis.  ? ?  ? ?Malnutrition Type: ? ?Nutrition Problem: Increased nutrient needs ?Etiology: chronic illness (cirrhosis) ? ? ?Malnutrition Characteristics: ? ?Signs/Symptoms: estimated needs ? ? ?Nutrition Interventions: ? ?Interventions: Refer to RD note for recommendations ? ?Estimated body mass index is 32.66 kg/m? as calculated from the following: ?  Height as of this encounter: '5\' 2"'$  (1.575 m). ?  Weight as of this encounter: 81  kg. ? ?Code Status: DNR ?DVT Prophylaxis:  SCDs Start: 02/23/22 1736 ? ? ?Level of Care: Level of care: Med-Surg ?Family Communication: Updated patient's daughter at bedside.  ?Disposition Plan:     Remains inpatient appropriate:  currently waiting for SNF for discharge.  ? ?Procedures:  ?US paracentesis.  ? ?Consultants:   ?Palliative care  ?Nephrology.  ? ?Antimicrobials:  ? ?Anti-infectives (From admission, onward)  ? ? Start     Dose/Rate Route Frequency Ordered Stop  ? 02/24/22 1000  cefOXitin (MEFOXIN) 2 g in sodium chloride 0.9 % 100 mL IVPB       ? 2 g ?200 mL/hr over 30 Minutes Intravenous  Once 02/24/22 0956 02/24/22 1041  ? 02/23/22 1830  vancomycin (VANCOCIN) capsule 125 mg       ? 125 mg Oral 4 times daily 02/23/22 1735 03/05/22 1759  ? ?  ? ? ? ?Medications ? ?Scheduled Meds: ? (feeding supplement) PROSource Plus  30 mL Oral BID BM  ? allopurinol  200 mg Oral Daily  ? amiodarone  200 mg Oral Daily  ? Chlorhexidine Gluconate Cloth  6 each Topical Daily  ? feeding supplement  1 Container Oral TID BM  ? furosemide  10 mg Oral Daily  ? Gerhardt's butt cream   Topical TID  ? levothyroxine  200 mcg Oral q morning  ? midodrine  15 mg Oral TID WC  ? multivitamin with minerals  1 tablet Oral Daily  ? pantoprazole  40 mg Oral BID  ? sertraline  50 mg Oral Daily  ? sodium bicarbonate  650 mg Oral TID  ? vancomycin  125 mg Oral QID  ? ?Continuous Infusions: ?PRN Meds:.acetaminophen **OR** acetaminophen, ondansetron **OR** ondansetron (ZOFRAN) IV ? ? ? ?Subjective:  ? ?Diane Pruitt was seen and examined today. She is feeling good. No new complaints.  ? ?Objective:  ? ?Vitals:  ? 03/02/22 2013 03/03/22 0500 03/03/22 0515 03/03/22 0737  ?BP: 121/68  (!) 103/54 (!) 100/53  ?Pulse: 71  65 62  ?Resp: '17  18 18  '$ ?Temp: 98.2 ?F (36.8 ?C)  97.9 ?F (36.6 ?C) 98 ?F (36.7 ?C)  ?TempSrc: Oral     ?SpO2: 92%  95% 95%  ?Weight:  81 kg    ?Height:      ? ? ?Intake/Output Summary (Last 24 hours) at 03/03/2022 1101 ?Last data  filed at 03/03/2022 [phone] ?Gross per 24 hour  ?Intake 340 ml  ?Output --  ?Net 340 ml  ? ? ?Filed Weights  ? 02/28/22 0500 03/01/22 0500 03/03/22 0500  ?Weight: 80.8 kg 80.8 kg 81 kg  ? ? ? ?Exam ?General exam: ill appearing lady not in distress.  ?Respiratory system: diminished air entry at bases. No wheezing heard.  ?Cardiovascular system: S1 & S2 heard, RRR. No JVD,  No pedal edema. ?Gastrointestinal system: Abdomen  is soft, non tender, mildly distended, bowel sounds wnl.  ?Central nervous system: Alert and oriented. No focal neurological deficits. ?Extremities: No pedal edema.  ?Skin: No rashes seen.  ?Psychiatry: Mood & affect appropriate.  ? ? ? ? ?Data Reviewed:  I have personally reviewed following labs and imaging studies ? ? ?CBC ?Lab Results  ?Component Value Date  ? WBC 24.1 (H) 03/03/2022  ? RBC 3.35 (L) 03/03/2022  ? HGB 8.9 (L) 03/03/2022  ? HCT 26.4 (L) 03/03/2022  ? MCV 78.8 (L) 03/03/2022  ? MCH 26.6 03/03/2022  ? PLT 155 03/03/2022  ? MCHC 33.7 03/03/2022  ? RDW 21.8 (H) 03/03/2022  ? LYMPHSABS 1.3 03/03/2022  ? MONOABS 1.1 (H) 03/03/2022  ? EOSABS 0.3 03/03/2022  ? BASOSABS 0.1 03/03/2022  ? ? ? ?Last metabolic panel ?Lab Results  ?Component Value Date  ? NA 132 (L) 03/03/2022  ? K 3.5 03/03/2022  ? CL 105 03/03/2022  ? CO2 17 (L) 03/03/2022  ? BUN 54 (H) 03/03/2022  ? CREATININE 2.57 (H) 03/03/2022  ? GLUCOSE 118 (H) 03/03/2022  ? GFRNONAA 20 (L) 03/03/2022  ? GFRAA >60 02/15/2020  ? CALCIUM 8.5 (L) 03/03/2022  ? PHOS 4.1 03/01/2022  ? PROT 5.5 (L) 03/03/2022  ? ALBUMIN 3.0 (L) 03/03/2022  ? BILITOT 2.0 (H) 03/03/2022  ? ALKPHOS 107 03/03/2022  ? AST 144 (H) 03/03/2022  ? ALT 58 (H) 03/03/2022  ? ANIONGAP 10 03/03/2022  ? ? ?CBG (last 3)  ?No results for input(s): GLUCAP in the last 72 hours.  ? ? ?Coagulation Profile: ?No results for input(s): INR, PROTIME in the last 168 hours. ? ? ? ?Radiology Studies: ?No results found. ? ? ? ? ?Hosie Poisson M.D. ?[facility] ?03/03/2022, 11:01  AM ? ?Available via Epic secure chat 7am-7pm ?After 7 pm, please refer to night coverage provider listed on amion. ? ? ? ?

## 2022-03-04 LAB — PROCALCITONIN: Procalcitonin: 0.82 ng/mL

## 2022-03-04 NOTE — Progress Notes (Signed)
? ? ? [facility] ?                                                                            ? ? ?Diane Pruitt, is a 71 y.o. female, DOB - 1951/06/15, GUR:[id number] ?Admit date - 02/23/2022    ?Outpatient Primary MD for the patient is [name], MD ? ?LOS - 9  days ? ? ? ?Brief summary  ? ?Diane Pruitt is a 71 y.o. 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   ?Patient was recently admitted to [facility] 4/3 -4/6 for upper GI bleeding, portal hypertensive gastropathy, ascites and was discharged to a skilled nursing facility near [place].  Patient tells me she felt well for a few days, however since then she felt poorly, poor appetite, weakness, dizziness and abdominal distention and diarrhea.   ?On admission to [place] she was found with AKI consistent with hepatorenal syndrome, C. difficile positive diarrhea. ?Paracentesis 4/26 ?IR consulted, noted portal vein thrombosis.  After careful discussion, decided to undergo TIPS procedure hence transferred to [facility]. ?Attempted TIPS and unable to do it.  Remains debilitated. After further discussions with palliative care and TOC, patient and family decided to plan for SNF with hospice.  ? ? ?Assessment & Plan  ? ? ?Assessment and Plan: ? 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 ?Unable to do TIPS due to difficult anatomy.  Unable to tolerate diuretics due to severe orthostatic vital signs. ?Patient underwent paracentesis on 4/25, 4/29.  ? Currently waiting for SNF with hospice.  ? ? ? ?Acute kidney injury with stage IIIa CKD and Non anion gap metabolic acidosis.   ?Possible hepatorenal syndrome ?Nephrology on board and appreciate recommendations ?Did not improve albumin infusions. Slight worsening of creatinine with lasix 10 mg .  ?Creatinine continues to worsen , hence will discontinue Lasix.  ? ? ?C diff colitis:  ?Continue vancomycin to complete 10 day course.  ?Patient continues tto have bowel movements. 3 so far.  ? ? ?Hypothyroidism:  ?On synthroid 200 mcg daily.  ? ?Hypomagnesemia:  ?Replaced. Repeat level snl.  ? ? ?Acute Metabolic encephalopathy/ Mild hepatic encephalopathy:  ?Ammonia levels at 46, slightly high, ordered lactulose with bowel movements . ?Repeat level is wnl. She is more alert and answering all questions appropriately.  ? ? ?Hyponatremia:  ?From fluid overload and liver failure. Improved to 132.  ? ?Hypotension:  ?On midodrine three times a day.  ?BP parameters are borderline.  ? ? ?Hyperkalemia:  ?Resolved.  ? ? ?Anemia of chronic disease:  ?- hemoglobin around 8.4.  ? ? ?Thrombocytopenia:  ?Mild, resolved.  ?  ? ?Atrial fibrillation paroxysmal  ?- on amiodarone 200 mg daily.  ?Not on anti coagulation.  ? ?Pt continues to decline, family requested to speak to palliative care. Palliative care consulted. Plan for SNF with hospice services.  ?Toc on board.  ? ? ?Leukocytosis: ?-  persistent, unclear etiology.  She does not appear toxic.  ?- Pro calcitonin is 0.85. to 0.82 ?- no urinary symptoms.  ?- Prior  UA is negative and rpt UA is pending.  ?- CXR is negative for infection.  ?- no abdominal tenderness.  ? ? ?Elevated liver enzymes: ?From Cirrhosis.  ? ?  ? ?Malnutrition Type: ? ?Nutrition Problem: Increased nutrient needs ?Etiology: chronic illness (cirrhosis) ? ? ?Malnutrition Characteristics: ? ?Signs/Symptoms: estimated needs ? ? ?Nutrition Interventions: ? ?Interventions: Refer to RD note for recommendations ? ?Estimated body mass index is 32.66 kg/m? as calculated from the following: ?  Height as of this encounter: '5\' 2"'$  (1.575 m). ?  Weight  as of this encounter: 81 kg. ? ?Code Status: DNR ?DVT Prophylaxis:  SCDs Start: 02/23/22 1736 ? ? ?Level of Care: Level of care: Med-Surg ?Family Communication: none at bedside.  ?Disposition Plan:     Remains inpatient appropriate:  currently waiting for SNF for discharge.  ? ?Procedures:  ?US paracentesis.  ? ?Consultants:   ?Palliative care  ?Nephrology.  ? ?Antimicrobials:  ? ?Anti-infectives (From admission, onward)  ? ? Start     Dose/Rate Route Frequency Ordered Stop  ? 02/24/22 1000  cefOXitin (MEFOXIN) 2 g in sodium chloride 0.9 % 100 mL IVPB       ? 2 g ?200 mL/hr over 30 Minutes Intravenous  Once 02/24/22 0956 02/24/22 1041  ? 02/23/22 1830  vancomycin (VANCOCIN) capsule 125 mg       ? 125 mg Oral 4 times daily 02/23/22 1735 03/05/22 1759  ? ?  ? ? ? ?Medications ? ?Scheduled Meds: ? (feeding supplement) PROSource Plus  30 mL Oral BID BM  ? allopurinol  200 mg Oral Daily  ? amiodarone  200 mg Oral Daily  ? Chlorhexidine Gluconate Cloth  6 each Topical Daily  ? feeding supplement  1 Container Oral TID BM  ? Gerhardt's butt cream   Topical TID  ? levothyroxine  200 mcg Oral q morning  ? midodrine  15 mg Oral TID WC  ? multivitamin with minerals  1 tablet Oral Daily  ? pantoprazole  40 mg Oral BID  ? sertraline  50 mg Oral Daily  ? sodium bicarbonate  650 mg Oral TID  ? vancomycin  125 mg Oral QID  ? ?Continuous Infusions: ?PRN Meds:.acetaminophen **OR** acetaminophen, ondansetron **OR** ondansetron (ZOFRAN) IV ? ? ? ?Subjective:  ? ?Diane Pruitt was seen and examined today. No new complaints.  ? ?Objective:  ? ?Vitals:  ? 03/03/22 1508 03/03/22 2117 03/04/22 0540 03/04/22 0912  ?BP: (!) 115/57 113/60 (!) 103/50 95/60  ?Pulse: 66 67 66 84  ?Resp: '18 18 18 18  '$ ?Temp: 98.1 ?F (36.7 ?C) 98.5 ?F (36.9 ?C) 97.9 ?F (36.6 ?C) 97.7 ?F (36.5 ?C)  ?TempSrc: Oral Oral Oral Oral  ?SpO2: 95% 94% 95% 95%  ?Weight:      ?Height:      ? ? ?Intake/Output Summary (Last 24 hours) at 03/04/2022 1513 ?Last data filed at  03/03/2022 2100 ?Gross per 24 hour  ?Intake 240 ml  ?Output --  ?Net 240 ml  ? ? ?Filed Weights  ? 02/28/22 0500 03/01/22 0500 03/03/22 0500  ?Weight: 80.8 kg 80.8 kg 81 kg  ? ? ? ?Exam ?General exam: ill appearing lady not in distress.  ?Respiratory system: Clear to auscultation. Respiratory effort normal. ?Cardiovascular system: S1 & S2 heard, RRR. No JVD,  ?Gastrointestinal system: Abdomen is soft, distended, non tender, bowel sounds wnl.  ?Central nervous system:  Alert and oriented. No focal neurological deficits. ?Extremities: no pedal edema.  ?Skin: No rashes, seen.  ?Psychiatry: flat affect.  ? ? ? ? ? ?Data Reviewed:  I have personally reviewed following labs and imaging studies ? ? ?CBC ?Lab Results  ?Component Value Date  ? WBC 24.1 (H) 03/03/2022  ? RBC 3.35 (L) 03/03/2022  ? HGB 8.9 (L) 03/03/2022  ? HCT 26.4 (L) 03/03/2022  ? MCV 78.8 (L) 03/03/2022  ? MCH 26.6 03/03/2022  ? PLT 155 03/03/2022  ? MCHC 33.7 03/03/2022  ? RDW 21.8 (H) 03/03/2022  ? LYMPHSABS 1.3 03/03/2022  ? MONOABS 1.1 (H) 03/03/2022  ? EOSABS 0.3 03/03/2022  ? BASOSABS 0.1 03/03/2022  ? ? ? ?Last metabolic panel ?Lab Results  ?Component Value Date  ? NA 132 (L) 03/03/2022  ? K 3.5 03/03/2022  ? CL 105 03/03/2022  ? CO2 17 (L) 03/03/2022  ? BUN 54 (H) 03/03/2022  ? CREATININE 2.57 (H) 03/03/2022  ? GLUCOSE 118 (H) 03/03/2022  ? GFRNONAA 20 (L) 03/03/2022  ? GFRAA >60 02/15/2020  ? CALCIUM 8.5 (L) 03/03/2022  ? PHOS 4.1 03/01/2022  ? PROT 5.5 (L) 03/03/2022  ? ALBUMIN 3.0 (L) 03/03/2022  ? BILITOT 2.0 (H) 03/03/2022  ? ALKPHOS 107 03/03/2022  ? AST 144 (H) 03/03/2022  ? ALT 58 (H) 03/03/2022  ? ANIONGAP 10 03/03/2022  ? ? ?CBG (last 3)  ?No results for input(s): GLUCAP in the last 72 hours.  ? ? ?Coagulation Profile: ?No results for input(s): INR, PROTIME in the last 168 hours. ? ? ? ?Radiology Studies: ?DG CHEST PORT 1 VIEW ? ?Result Date: 03/03/2022 ?CLINICAL DATA:  Follow-up EXAM: PORTABLE CHEST 1 VIEW COMPARISON:  Chest x-ray dated February 24, 2022 FINDINGS: The heart size and mediastinal contours are within normal limits. Low lung volumes with bibasilar linear opacities. Mitral annular calcifications. No large pleural effusion or evidence of pn

## 2022-03-05 ENCOUNTER — Inpatient Hospital Stay (HOSPITAL_COMMUNITY): Payer: Medicare HMO

## 2022-03-05 HISTORY — PX: IR PARACENTESIS: IMG2679

## 2022-03-05 LAB — CBC
HCT: 28.6 % — ABNORMAL LOW (ref 36.0–46.0)
Hemoglobin: 9.4 g/dL — ABNORMAL LOW (ref 12.0–15.0)
MCH: 26.1 pg (ref 26.0–34.0)
MCHC: 32.9 g/dL (ref 30.0–36.0)
MCV: 79.4 fL — ABNORMAL LOW (ref 80.0–100.0)
Platelets: 158 10*3/uL (ref 150–400)
RBC: 3.6 MIL/uL — ABNORMAL LOW (ref 3.87–5.11)
RDW: 22.5 % — ABNORMAL HIGH (ref 11.5–15.5)
WBC: 26.7 10*3/uL — ABNORMAL HIGH (ref 4.0–10.5)
nRBC: 0 % (ref 0.0–0.2)

## 2022-03-05 LAB — BODY FLUID CELL COUNT WITH DIFFERENTIAL
Eos, Fluid: 0 %
Lymphs, Fluid: 23 %
Monocyte-Macrophage-Serous Fluid: 15 % — ABNORMAL LOW (ref 50–90)
Neutrophil Count, Fluid: 62 % — ABNORMAL HIGH (ref 0–25)
Total Nucleated Cell Count, Fluid: 91 cu mm (ref 0–1000)

## 2022-03-05 LAB — BASIC METABOLIC PANEL
Anion gap: 11 (ref 5–15)
BUN: 68 mg/dL — ABNORMAL HIGH (ref 8–23)
CO2: 16 mmol/L — ABNORMAL LOW (ref 22–32)
Calcium: 8.3 mg/dL — ABNORMAL LOW (ref 8.9–10.3)
Chloride: 104 mmol/L (ref 98–111)
Creatinine, Ser: 2.46 mg/dL — ABNORMAL HIGH (ref 0.44–1.00)
GFR, Estimated: 21 mL/min — ABNORMAL LOW (ref >60)
Glucose, Bld: 106 mg/dL — ABNORMAL HIGH (ref 70–99)
Potassium: 3.8 mmol/L (ref 3.5–5.1)
Sodium: 131 mmol/L — ABNORMAL LOW (ref 135–145)

## 2022-03-05 LAB — GRAM STAIN

## 2022-03-05 LAB — ALBUMIN, PLEURAL OR PERITONEAL FLUID: Albumin, Fluid: <1.5 g/dL

## 2022-03-05 LAB — PROTEIN, PLEURAL OR PERITONEAL FLUID: Total protein, fluid: <3 g/dL

## 2022-03-05 MED ORDER — LIDOCAINE HCL (PF) 1 % IJ SOLN
INTRAMUSCULAR | Status: DC | PRN
  Administered 2022-03-05: 10 mL

## 2022-03-05 MED ORDER — ALBUMIN HUMAN 25 % IV SOLN
25.0000 g | Freq: Once | INTRAVENOUS | Status: AC
  Administered 2022-03-05: 25 g via INTRAVENOUS
  Filled 2022-03-05: qty 100

## 2022-03-05 MED ORDER — LIDOCAINE HCL 1 % IJ SOLN
INTRAMUSCULAR | Status: AC
  Filled 2022-03-05: qty 20

## 2022-03-05 MED ORDER — CEFTRIAXONE SODIUM 2 G IJ SOLR
2.0000 g | INTRAMUSCULAR | Status: DC
Start: 2022-03-06 — End: 2022-03-07
  Administered 2022-03-06 (×2): 2 g via INTRAVENOUS
  Filled 2022-03-05 (×2): qty 20

## 2022-03-05 NOTE — TOC Progression Note (Signed)
Transition of Care (TOC) - Progression Note  ? ? ?Patient Details  ?Name: Diane Pruitt ?MRN: [id number] ?Date of Birth: Mar 08, 1951 ? ?Transition of Care (TOC) CM/SW Contact  ?Emeterio Reeve, LCSW ?Phone Number: ?03/05/2022, 4:11 PM ? ?Clinical Narrative:    ? ?CSW spoke to pts daughter at bedside. CSW gave daughter 1 new bed offer. Daughter accepted [facility]. [facility] admissions coordinator will reach out to [name] to discuss payment.  ? ?Expected Discharge Plan: [place] ?Barriers to Discharge: Continued Medical Work up, Ship broker ? ?Expected Discharge Plan and Services ?Expected Discharge Plan: [place] ?  ?  ?Post Acute Care Choice: [place] ?Living arrangements for the past 2 months: [place] ?                ?  ?  ?  ?  ?  ?  ?  ?  ?  ?  ? ? ?Social Determinants of Health (SDOH) Interventions ?  ? ?Readmission Risk Interventions ?   ? View : No data to display.  ?  ?  ?  ? ?Emeterio Reeve, LCSW ?Clinical Social Worker ? ?

## 2022-03-05 NOTE — Progress Notes (Signed)
? ? ? [facility] ?                                                                            ? ? ?Diane Pruitt, is a 71 y.o. female, DOB - 18-Sep-1951, PIR:[id number] ?Admit date - 02/23/2022    ?Outpatient Primary MD for the patient is [name], MD ? ?LOS - 10  days ? ? ? ?Brief summary  ? ?Diane Pruitt is a 71 y.o. 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   ?Patient was recently admitted to [facility] 4/3 -4/6 for upper GI bleeding, portal hypertensive gastropathy, ascites and was discharged to a skilled nursing facility near [place].  Patient tells me she felt well for a few days, however since then she felt poorly, poor appetite, weakness, dizziness and abdominal distention and diarrhea.   ?On admission to [facility] she was found with AKI consistent with hepatorenal syndrome, C. difficile positive diarrhea. ?Paracentesis 4/26 ?IR consulted, noted portal vein thrombosis.  After careful discussion, decided to undergo TIPS procedure hence transferred to [facility]. ?Attempted TIPS and unable to do it.  Remains debilitated. After further discussions with palliative care and TOC, patient and family decided to plan for SNF with hospice.  ? ? ?Assessment & Plan  ? ? ?Assessment and Plan: ? 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 ?Unable to do TIPS due to difficult anatomy.  Unable to tolerate diuretics due to severe orthostatic vital signs. ?Patient underwent paracentesis on 4/25, 4/29. 5/8 and fluid sent for analysis . ?She was started on rocephin for possible SBP due to worsening leukocytosis.  ? Currently waiting  for SNF with hospice.  ? ? ? ?Acute kidney injury with stage IIIa CKD and Non anion gap metabolic acidosis.  ?Possible hepatorenal syndrome ?Nephrology on board and appreciate recommendations ?Did not improve albumin infusions. Slight worsening of creatinine with lasix 10 mg .  ?Creatinine continues to worsen , hence will discontinue Lasix.  ?Creatinine level plateaued.  ? ? ?C diff colitis:  ?Continue vancomycin to complete 10 day course.  ?Diarrhea improved.  ? ? ?Hypothyroidism:  ?On synthroid 200 mcg daily.  ? ?Hypomagnesemia:  ?Replaced. Repeat level snl.  ? ? ?Acute Metabolic encephalopathy/ Mild hepatic encephalopathy:  ?Ammonia levels at 46, slightly high, ordered lactulose with bowel movements . ?Repeat level is wnl. She is more alert and answering all questions appropriately.  ? ? ?Hyponatremia:  ?From fluid overload and liver failure. Improved to 132.  ? ?Hypotension:  ?On midodrine three times a day.  ?BP parameters are borderline.  ? ? ?Hyperkalemia:  ?Resolved.  ? ? ?Anemia of chronic disease:  ?- hemoglobin around 8.4.  ? ? ?Thrombocytopenia:  ?Mild, resolved.  ?  ? ?Atrial fibrillation paroxysmal  ?- on amiodarone 200 mg daily.  ?Not on anti coagulation.  ? ?Pt continues to decline, family requested to speak to palliative care. Palliative care consulted. Plan for SNF  with hospice services.  ?Toc on board.  ? ? ?Leukocytosis: ?-  persistent, unclear etiology.  She does not appear toxic. Afebrile.  ?- Pro calcitonin is 0.85. to 0.82 ?- no urinary symptoms.  ?- Prior  UA is negative and rpt UA is pending.  ?- CXR is negative for infection.  ?- no abdominal tenderness.  ?- she was started empirically on rocephin for SBP.  ? ? ?Elevated liver enzymes: ?From Cirrhosis.  ? ?  ? ?Malnutrition Type: ? ?Nutrition Problem: Increased nutrient needs ?Etiology: chronic illness (cirrhosis) ? ? ?Malnutrition Characteristics: ? ?Signs/Symptoms: estimated needs ? ? ?Nutrition Interventions: ? ?Interventions: Refer to  RD note for recommendations ? ?Estimated body mass index is 32.66 kg/m? as calculated from the following: ?  Height as of this encounter: '5\' 2"'$  (1.575 m). ?  Weight as of this encounter: 81 kg. ? ?Code Status: DNR ?DVT Prophylaxis:  SCDs Start: 02/23/22 1736 ? ? ?Level of Care: Level of care: Med-Surg ?Family Communication: none at bedside.  ?Disposition Plan:     Remains inpatient appropriate:  currently waiting for SNF for discharge.  ? ?Procedures:  ?US paracentesis.  ? ?Consultants:   ?Palliative care  ?Nephrology.  ? ?Antimicrobials:  ? ?Anti-infectives (From admission, onward)  ? ? Start     Dose/Rate Route Frequency Ordered Stop  ? 03/06/22 0000  cefTRIAXone (ROCEPHIN) 2 g in sodium chloride 0.9 % 100 mL IVPB       ? 2 g ?200 mL/hr over 30 Minutes Intravenous Every 24 hours 03/05/22 1250    ? 02/24/22 1000  cefOXitin (MEFOXIN) 2 g in sodium chloride 0.9 % 100 mL IVPB       ? 2 g ?200 mL/hr over 30 Minutes Intravenous  Once 02/24/22 0956 02/24/22 1041  ? 02/23/22 1830  vancomycin (VANCOCIN) capsule 125 mg       ? 125 mg Oral 4 times daily 02/23/22 1735 03/05/22 1759  ? ?  ? ? ? ?Medications ? ?Scheduled Meds: ? (feeding supplement) PROSource Plus  30 mL Oral BID BM  ? allopurinol  200 mg Oral Daily  ? amiodarone  200 mg Oral Daily  ? Chlorhexidine Gluconate Cloth  6 each Topical Daily  ? feeding supplement  1 Container Oral TID BM  ? Gerhardt's butt cream   Topical TID  ? levothyroxine  200 mcg Oral q morning  ? lidocaine      ? midodrine  15 mg Oral TID WC  ? multivitamin with minerals  1 tablet Oral Daily  ? pantoprazole  40 mg Oral BID  ? sertraline  50 mg Oral Daily  ? sodium bicarbonate  650 mg Oral TID  ? ?Continuous Infusions: ? [START ON 03/06/2022] cefTRIAXone (ROCEPHIN)  IV    ? ?PRN Meds:.acetaminophen **OR** acetaminophen, lidocaine (PF), ondansetron **OR** ondansetron (ZOFRAN) IV ? ? ? ?Subjective:  ? ?Diane Pruitt was seen and examined today. Feeling better after paracentesis.  ? ?Objective:   ? ?Vitals:  ? 03/05/22 0602 03/05/22 0813 03/05/22 0945 03/05/22 1651  ?BP: 107/64 118/64 110/69 (!) 104/57  ?Pulse: 100 (!) 105  (!) 107  ?Resp: '18 18  18  '$ ?Temp:  98.1 ?F (36.7 ?C)  98.4 ?F (36.9 ?C)  ?TempSrc:  Oral  Oral  ?SpO2: 95% 99%  98%  ?Weight:      ?Height:      ? ? ?Intake/Output Summary (Last 24 hours) at 03/05/2022 1951 ?Last data filed at 03/05/2022 1506 ?Gross per 24 hour  ?Intake 40.56  ml  ?Output 300 ml  ?Net -259.44 ml  ? ? ?Filed Weights  ? 02/28/22 0500 03/01/22 0500 03/03/22 0500  ?Weight: 80.8 kg 80.8 kg 81 kg  ? ? ? ?Exam ?General exam: ill appearing lady not in distress.   ?Respiratory system: Clear to auscultation. Respiratory effort normal. ?Cardiovascular system: S1 & S2 heard, RRR. No JVD,No pedal edema. ?Gastrointestinal system: Abdomen is soft non tender, distension Normal bowel sounds heard. ?Central nervous system: Alert and oriented. No focal neurological deficits. ?Extremities: Symmetric 5 x 5 power. ?Skin: No rashes, ?Psychiatry: Mood & affect appropriate.  ? ? ? ? ? ?Data Reviewed:  I have personally reviewed following labs and imaging studies ? ? ?CBC ?Lab Results  ?Component Value Date  ? WBC 26.7 (H) 03/05/2022  ? RBC 3.60 (L) 03/05/2022  ? HGB 9.4 (L) 03/05/2022  ? HCT 28.6 (L) 03/05/2022  ? MCV 79.4 (L) 03/05/2022  ? MCH 26.1 03/05/2022  ? PLT 158 03/05/2022  ? MCHC 32.9 03/05/2022  ? RDW 22.5 (H) 03/05/2022  ? LYMPHSABS 1.3 03/03/2022  ? MONOABS 1.1 (H) 03/03/2022  ? EOSABS 0.3 03/03/2022  ? BASOSABS 0.1 03/03/2022  ? ? ? ?Last metabolic panel ?Lab Results  ?Component Value Date  ? NA 131 (L) 03/05/2022  ? K 3.8 03/05/2022  ? CL 104 03/05/2022  ? CO2 16 (L) 03/05/2022  ? BUN 68 (H) 03/05/2022  ? CREATININE 2.46 (H) 03/05/2022  ? GLUCOSE 106 (H) 03/05/2022  ? GFRNONAA 21 (L) 03/05/2022  ? GFRAA >60 02/15/2020  ? CALCIUM 8.3 (L) 03/05/2022  ? PHOS 4.1 03/01/2022  ? PROT 5.5 (L) 03/03/2022  ? ALBUMIN 3.0 (L) 03/03/2022  ? BILITOT 2.0 (H) 03/03/2022  ? ALKPHOS 107 03/03/2022  ?  AST 144 (H) 03/03/2022  ? ALT 58 (H) 03/03/2022  ? ANIONGAP 11 03/05/2022  ? ? ?CBG (last 3)  ?No results for input(s): GLUCAP in the last 72 hours.  ? ? ?Coagulation Profile: ?No results for input(s):

## 2022-03-05 NOTE — Procedures (Signed)
PROCEDURE SUMMARY: ? ?Successful ultrasound guided paracentesis from the left lower quadrant.  ?Yielded 5.3 L of clear yellow fluid.  ?No immediate complications.  ?The patient tolerated the procedure well.  ? ?Specimen sent for labs. ? ?EBL < 2 mL ? ?The patient has previously been formally evaluated by the [facility] and is being actively followed for potential future intervention. ? ?Soyla Dryer, AGACNP-BC ?[phone] ?03/05/2022, 11:03 AM ? ? ?

## 2022-03-05 NOTE — Progress Notes (Signed)
Nutrition Follow-up ? ?DOCUMENTATION CODES:  ?Not applicable ? ?INTERVENTION:  ?Continue current diet as ordered. Encourage PO intake ?Continue Boost Breeze po TID, each supplement provides 250 kcal and 9 grams of protein  ?Continue 30 ml Prosource Plus TID, each supplement provides 100 kcals and 15 grams protein ?MVI with minerals daily ? ?NUTRITION DIAGNOSIS:  ?Increased nutrient needs related to chronic illness (cirrhosis) as evidenced by estimated needs. ?- remains applicable ? ?GOAL:  ?Patient will meet greater than or equal to 90% of their needs ?- progressing, diet in place, supplements in place ? ?MONITOR:  ?PO intake, Supplement acceptance, I & O's, Labs ? ?REASON FOR ASSESSMENT:  ?Consult ?Assessment of nutrition requirement/status ? ?ASSESSMENT:  ?71 y.o. female with history of HTN, HLD, atrial fibrillation, hx of ischemic stroke, CKD3, alcoholic liver cirrhosis, and barrett's esophagus presented to [facility] from SNF for worsening ascites. Transferred to [facility] to undergo TIPS procedure. ? ?4/26 - paracentesis, 6.6L of clear yellow fluid removed ?4/28 - transferred to [facility] ?4/29 - TIPS attempted in radiology but was unsuccessful due to pt's anatomy; Paracentesis: 3.8L of serous ascites removed ?5/8 - paracentesis, 5.3L of clear yellow fluid removed ? ?Since last assessment, palliative care has been consulted and discussed plan of care with pt and family. Current plan is for pt to be discharged to a SNF with hospice following. ? ?Pt resting in bed at the time of assessment. Reports that appetite is poor, but that she is drinking the boost breeze and prosource plus. Declines additional supplements at this time or supplemental snacks. ? ?Case management team working on placement.   ? ? ?Intake/Output Summary (Last 24 hours) at 03/05/2022 1408 ?Last data filed at 03/05/2022 0800 ?Gross per 24 hour  ?Intake 360 ml  ?Output 300 ml  ?Net 60 ml  ?Net IO Since Admission: 1,128.09 mL [03/05/22 1408]  ? ?Average  Meal Intake: ?4/27-5/1: 25% intake x 5 recorded meals ?5/2-5/8: 16% intake x 4 recorded meals ? ?Nutritionally Relevant Medications: ?Scheduled Meds: ? PROSource Plus  30 mL Oral BID BM  ? feeding supplement  1 Container Oral TID BM  ? levothyroxine  200 mcg Oral q morning  ? multivitamin with minerals  1 tablet Oral Daily  ? pantoprazole  40 mg Oral BID  ? sodium bicarbonate  650 mg Oral TID  ? vancomycin  125 mg Oral QID  ? ?PRN Meds: ondansetron ? ?Labs Reviewed: ?Sodium 131 ?BUN 68, creatinine 2.46 ? ?NUTRITION - FOCUSED PHYSICAL EXAM: ?Flowsheet Row Most Recent Value  ?Orbital Region No depletion  ?Upper Arm Region No depletion  ?Thoracic and Lumbar Region No depletion  ?Buccal Region No depletion  ?Temple Region No depletion  ?Clavicle Bone Region No depletion  ?Clavicle and Acromion Bone Region No depletion  ?Scapular Bone Region No depletion  ?Dorsal Hand No depletion  ?Patellar Region No depletion  ?Anterior Thigh Region No depletion  ?Posterior Calf Region No depletion  ?Edema (RD Assessment) None  ?Hair Reviewed  ?Eyes Reviewed  ?Mouth Reviewed  ?Skin Reviewed  ?Nails Reviewed  ? ?Diet Order:   ?Diet Order   ? ?       ?  Diet regular Room service appropriate? Yes; Fluid consistency: Thin  Diet effective now       ?  ? ?  ?  ? ?  ? ? ?EDUCATION NEEDS:  ?No education needs have been identified at this time ? ?Skin:  Skin Assessment: Reviewed RN Assessment ? ?Last BM:  5/6 -  type 5 ? ?Height:  ?Ht Readings from Last 1 Encounters:  ?02/23/22 '5\' 2"'$  (1.575 m)  ? ? ?Weight:  ?Wt Readings from Last 1 Encounters:  ?03/03/22 81 kg  ? ? ?Ideal Body Weight:  50 kg ? ?BMI:  Body mass index is 32.66 kg/m?. ? ?Estimated Nutritional Needs:  ?Kcal:  1700-1900 kcal/d ?Protein:  90-100 g/d ?Fluid:  1.8-2L/d ? ? ? ?Ranell Patrick, RD, LDN ?Clinical Dietitian ?RD pager # available in [place]  ?After hours/weekend pager # available in [place] ?

## 2022-03-05 NOTE — Progress Notes (Signed)
Physical Therapy Treatment ?Patient Details ?Name: Diane Pruitt ?MRN: [id number] ?DOB: 1951-01-29 ?Today's Date: 03/05/2022 ? ? ?History of Present Illness 71 yo female presenitng 4/28 from [facility] for TIPS procedure on 4/29. The pt had been admitted 4/25 - 4/28 due to hypertensive gastropathy. Of note, pt also admitted 4/3-4/6 for upper GI bleed and was d/c to SNF. PMH includes: depression, afib, ischemic stroke, CKD III, and alcoholic liver cirrhosis. ? ?  ?PT Comments  ? ? Pt was seen for therapy today, and due to having recently had a paracentesis today, had a session with bed exercises.  Pt was assisted to sit up higher in the bed, but did not want to eat her meal which had arrived at the end of the session.  Pt is reluctant to move much with procedure, but is permitted to move to sit up.  Follow along with her to progress standing and sitting OOB, to increase active use of LE's and to work toward better control of trunk and balance to assist with making progress in SNF.    ?Recommendations for follow up therapy are one component of a multi-disciplinary discharge planning process, led by the attending physician.  Recommendations may be updated based on patient status, additional functional criteria and insurance authorization. ? ?Follow Up Recommendations ? Skilled nursing-short term rehab (<3 hours/day) ?  ?  ?Assistance Recommended at Discharge Frequent or constant Supervision/Assistance  ?Patient can return home with the following A little help with walking and/or transfers;A little help with bathing/dressing/bathroom;Assistance with cooking/housework;Direct supervision/assist for medications management;Assist for transportation;Help with stairs or ramp for entrance ?  ?Equipment Recommendations ? None recommended by PT  ?  ?Recommendations for Other Services   ? ? ?  ?Precautions / Restrictions Precautions ?Precautions: Fall;Other (comment) ?Precaution Comments: watch soft  BP ?Restrictions ?Weight Bearing Restrictions: No  ?  ? ?Mobility ? Bed Mobility ?Overal bed mobility: Needs Assistance ?  ?  ?  ?  ?  ?  ?General bed mobility comments: scooting up bed with assist of two from bed pad ?  ? ?Transfers ?  ?  ?  ?  ?  ?  ?  ?  ?  ?General transfer comment: declined ?  ? ?Ambulation/Gait ?  ?  ?  ?  ?  ?  ?  ?  ? ? ?Stairs ?  ?  ?  ?  ?  ? ? ?Wheelchair Mobility ?  ? ?Modified Rankin (Stroke Patients Only) ?  ? ? ?  ?Balance   ?  ?  ?  ?  ?  ?  ?  ?  ?  ?  ?  ?  ?  ?  ?  ?  ?  ?  ?  ? ?  ?Cognition Arousal/Alertness: Awake/alert ?Behavior During Therapy: Flat affect ?Overall Cognitive Status: Within Functional Limits for tasks assessed ?  ?  ?  ?  ?  ?  ?  ?  ?  ?  ?  ?  ?  ?  ?  ?  ?General Comments: pt is back from paracentesis, did bed ex ?  ?  ? ?  ?Exercises General Exercises - Lower Extremity ?Ankle Circles/Pumps: AAROM, 5 reps ?Quad Sets: AROM, 10 reps ?Gluteal Sets: AROM, 10 reps ?Heel Slides: AROM, 10 reps ?Hip ABduction/ADduction: AROM, AAROM, 10 reps ? ?  ?General Comments General comments (skin integrity, edema, etc.): pt is back from procedure, assisted her to review LE strengthening ex on bed and nursing assisted  PT to scoot up in bed. ?  ?  ? ?Pertinent Vitals/Pain Pain Assessment ?Pain Assessment: Faces ?Faces Pain Scale: Hurts a little bit ?Pain Location: abdomen ?Pain Descriptors / Indicators: Guarding ?Pain Intervention(s): Monitored during session, Repositioned  ? ? ?Home Living   ?  ?  ?  ?  ?  ?  ?  ?  ?  ?   ?  ?Prior Function    ?  ?  ?   ? ?PT Goals (current goals can now be found in the care plan section) Acute Rehab PT Goals ?Patient Stated Goal: get better ? ?  ?Frequency ? ? ? Min 2X/week ? ? ? ?  ?PT Plan Current plan remains appropriate  ? ? ?Co-evaluation   ?  ?  ?  ?  ? ?  ?AM-PAC PT "6 Clicks" Mobility   ?Outcome Measure ? Help needed turning from your back to your side while in a flat bed without using bedrails?: A Little ?Help needed moving from lying  on your back to sitting on the side of a flat bed without using bedrails?: A Lot ?Help needed moving to and from a bed to a chair (including a wheelchair)?: A Lot ?Help needed standing up from a chair using your arms (e.g., wheelchair or bedside chair)?: A Lot ?Help needed to walk in hospital room?: Total ?Help needed climbing 3-5 steps with a railing? : Total ?6 Click Score: 11 ? ?  ?End of Session Equipment Utilized During Treatment: Oxygen ?Activity Tolerance: Patient tolerated treatment well ?Patient left: with call bell/phone within reach;in bed;with bed alarm set ?Nurse Communication: Mobility status ?PT Visit Diagnosis: Other abnormalities of gait and mobility (R26.89);Unsteadiness on feet (R26.81);Muscle weakness (generalized) (M62.81) ?  ? ? ?Time: 4818-5631 ?PT Time Calculation (min) (ACUTE ONLY): 24 min ? ?Charges:  $Therapeutic Exercise: 8-22 mins ?$Therapeutic Activity: 8-22 mins   ?Ramond Dial ?03/05/2022, 5:04 PM ? ?Mee Hives, PT PhD ?Acute Rehab Dept. Number: [facility] [phone] and [place] [phone] ? ? ?

## 2022-03-06 LAB — CBC WITH DIFFERENTIAL/PLATELET
Abs Immature Granulocytes: 0.23 10*3/uL — ABNORMAL HIGH (ref 0.00–0.07)
Basophils Absolute: 0.1 10*3/uL (ref 0.0–0.1)
Basophils Relative: 0 %
Eosinophils Absolute: 0.1 10*3/uL (ref 0.0–0.5)
Eosinophils Relative: 0 %
HCT: 26.8 % — ABNORMAL LOW (ref 36.0–46.0)
Hemoglobin: 8.7 g/dL — ABNORMAL LOW (ref 12.0–15.0)
Immature Granulocytes: 1 %
Lymphocytes Relative: 4 %
Lymphs Abs: 0.9 10*3/uL (ref 0.7–4.0)
MCH: 26.4 pg (ref 26.0–34.0)
MCHC: 32.5 g/dL (ref 30.0–36.0)
MCV: 81.5 fL (ref 80.0–100.0)
Monocytes Absolute: 0.8 10*3/uL (ref 0.1–1.0)
Monocytes Relative: 3 %
Neutro Abs: 22.4 10*3/uL — ABNORMAL HIGH (ref 1.7–7.7)
Neutrophils Relative %: 92 %
Platelets: 142 10*3/uL — ABNORMAL LOW (ref 150–400)
RBC: 3.29 MIL/uL — ABNORMAL LOW (ref 3.87–5.11)
RDW: 22.5 % — ABNORMAL HIGH (ref 11.5–15.5)
WBC: 24.5 10*3/uL — ABNORMAL HIGH (ref 4.0–10.5)
nRBC: 0 % (ref 0.0–0.2)

## 2022-03-06 LAB — COMPREHENSIVE METABOLIC PANEL
ALT: 52 U/L — ABNORMAL HIGH (ref 0–44)
AST: 138 U/L — ABNORMAL HIGH (ref 15–41)
Albumin: 2.8 g/dL — ABNORMAL LOW (ref 3.5–5.0)
Alkaline Phosphatase: 112 U/L (ref 38–126)
Anion gap: 11 (ref 5–15)
BUN: 75 mg/dL — ABNORMAL HIGH (ref 8–23)
CO2: 18 mmol/L — ABNORMAL LOW (ref 22–32)
Calcium: 8.3 mg/dL — ABNORMAL LOW (ref 8.9–10.3)
Chloride: 102 mmol/L (ref 98–111)
Creatinine, Ser: 2.75 mg/dL — ABNORMAL HIGH (ref 0.44–1.00)
GFR, Estimated: 18 mL/min — ABNORMAL LOW (ref >60)
Glucose, Bld: 99 mg/dL (ref 70–99)
Potassium: 3.2 mmol/L — ABNORMAL LOW (ref 3.5–5.1)
Sodium: 131 mmol/L — ABNORMAL LOW (ref 135–145)
Total Bilirubin: 2.1 mg/dL — ABNORMAL HIGH (ref 0.3–1.2)
Total Protein: 5.6 g/dL — ABNORMAL LOW (ref 6.5–8.1)

## 2022-03-06 LAB — PHOSPHORUS: Phosphorus: 5.5 mg/dL — ABNORMAL HIGH (ref 2.5–4.6)

## 2022-03-06 LAB — CYTOLOGY - NON PAP

## 2022-03-06 LAB — MAGNESIUM: Magnesium: 1.8 mg/dL (ref 1.7–2.4)

## 2022-03-06 MED ORDER — ALBUMIN HUMAN 25 % IV SOLN
25.0000 g | Freq: Once | INTRAVENOUS | Status: AC
  Administered 2022-03-06: 25 g via INTRAVENOUS
  Filled 2022-03-06: qty 100

## 2022-03-06 MED ORDER — POTASSIUM CHLORIDE CRYS ER 20 MEQ PO TBCR
40.0000 meq | EXTENDED_RELEASE_TABLET | Freq: Once | ORAL | Status: AC
  Administered 2022-03-06: 40 meq via ORAL
  Filled 2022-03-06: qty 2

## 2022-03-06 NOTE — TOC Progression Note (Signed)
Transition of Care (TOC) - Progression Note  ? ? ?Patient Details  ?Name: Diane Pruitt ?MRN: [id number] ?Date of Birth: 31-May-1951 ? ?Transition of Care (TOC) CM/SW Contact  ?Emeterio Reeve, LCSW ?Phone Number: ?03/06/2022, 12:37 PM ? ?Clinical Narrative:    ? ?Pt has a bed at LTC bed at [facility]. Pts daughter is scheduled to sign paperwork today with admissions coordinator.[facility] can accept pt tomorrow.  ? ?Expected Discharge Plan: [place] ?Barriers to Discharge: Continued Medical Work up, Ship broker ? ?Expected Discharge Plan and Services ?Expected Discharge Plan: [place] ?  ?  ?Post Acute Care Choice: [place] ?Living arrangements for the past 2 months: [place] ?                ?  ?  ?  ?  ?  ?  ?  ?  ?  ?  ? ? ?Social Determinants of Health (SDOH) Interventions ?  ? ?Readmission Risk Interventions ?   ? View : No data to display.  ?  ?  ?  ? ?Emeterio Reeve, LCSW ?Clinical Social Worker ? ?

## 2022-03-06 NOTE — Progress Notes (Signed)
? ? ? [facility] ?                                                                            ? ? ?Diane Pruitt, is a 71 y.o. female, DOB - [birth year], RAQ:[id number] ?Admit date - 02/23/2022    ?Outpatient Primary MD for the patient is [name], MD ? ?LOS - 11  days ? ? ? ?Brief summary  ? ?Diane Pruitt is a 71 y.o. 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 for IR procedure.   ?Patient was recently admitted to [facility] 4/3 -4/6 for upper GI bleeding, portal hypertensive gastropathy, ascites and was discharged to a skilled nursing facility near [place]. ?On admission to [facility] she was found with AKI consistent with hepatorenal syndrome,  ?C. difficile positive diarrhea. ?IR consulted, noted portal vein thrombosis.  After careful discussion, decided to undergo TIPS procedure hence transferred to [facility]. ?Attempted TIPS and unable to do it due to difficult anatomy, postponed to 5/15 outpatient. Remains debilitated. After further discussions with palliative care and TOC, patient and family decided to plan for SNF with hospice.  ?Hospital course significant for worsening leukocytosis , was started on rocephin for possible SBP.  ? ? ?Assessment & Plan  ? ? ?Assessment and Plan: ? ?Cirrhosis with portal hypertensive gastropathy secondary to alcohol liver disease associated with recurrent ascites, portal vein thrombosis, possible SBP ?Unable to do TIPS due to difficult anatomy.  She is scheduled for another attempt  at TIPS on 5/15 outpatient.  ?Unable to tolerate diuretics due to severe orthostatic vital signs, hypotension and hepatorenal syndrome and AKI. ?Patient underwent paracentesis on 4/25, 4/29. 5/8 and fluid sent for  analysis . ?Post paracentesis she was given IV albumin 50 mg. Fluid analysis shows 62% neutrophils, and cultures are pending. Gram stain is negative.  ?She was started on rocephin for possible SBP due to persistent and worsening leukocytosis. ?CBC today show Leukocytosis improving, and if continues to improve, plan to transition to oral antibiotics and discharge to SNF with hospice.  ? ? ? ?Acute kidney injury superimposed on stage IIIa CKD and Non anion gap metabolic acidosis.  ?Baseline creatinine around 1.3 worsened to 2.75 today.  ?Possible hepatorenal syndrome ?Nephrology  consulted, signed off.  ?Did not improve with  albumin infusions. She was started on lasix 10 mg daily but had to be discontinued due to worsening creatinine and hypotension.  ? ? ?C diff colitis:  ?Continue vancomycin to complete 10 day course.  ?Diarrhea improving.  ? ? ?Hypothyroidism:  ?On synthroid 200 mcg daily.  ? ?Hypomagnesemia:  ?Replaced. Repeat level wnl. ? ? ?Acute Metabolic encephalopathy/ Mild hepatic encephalopathy:  ?Ammonia levels at 46, slightly high, ordered lactulose with bowel movements . ?Repeat level is wnl. She is more alert and answering all questions appropriately.  ? ? ?Hyponatremia:  ?From fluid overload and liver failure. Improved to 132.  ? ?Hypotension:  ?On midodrine three times a day.  ?BP parameters are borderline.  ? ? ?  Hyperkalemia:  ?Resolved.  ? ? ?Anemia of chronic disease:  ?- hemoglobin around 8.4.  ? ? ?Thrombocytopenia:  ?Mild, resolved.  ?  ? ?Atrial fibrillation paroxysmal  ?- on amiodarone 200 mg daily.  ?Not on anti coagulation.  ? ?Pt continues to decline, family requested to speak to palliative care. Palliative care consulted. Plan for SNF with hospice services.  ?Toc on board.  ? ? ?Leukocytosis: ?-  persistent, unclear etiology.  She does not appear toxic. Afebrile.  ?- Pro calcitonin is 0.85. to 0.82 ?- no urinary symptoms.  ?- Prior  UA is negative and rpt UA is pending.  ?- CXR is  negative for infection.  ?- no abdominal tenderness.  ?- she was started empirically on rocephin for SBP.  ? ? ?Elevated liver enzymes: ?From Cirrhosis.  ? ? ?Hypokalemia:  ?Replaced. Repeat in am.  ? ?  ? ?Malnutrition Type: ? ?Nutrition Problem: Increased nutrient needs ?Etiology: chronic illness (cirrhosis) ? ? ?Malnutrition Characteristics: ? ?Signs/Symptoms: estimated needs ? ? ?Nutrition Interventions: ? ?Interventions: Refer to RD note for recommendations ? ?Estimated body mass index is 30.04 kg/m? as calculated from the following: ?  Height as of this encounter: '5\' 2"'$  (1.575 m). ?  Weight as of this encounter: 74.5 kg. ? ?Code Status: DNR ?DVT Prophylaxis:  SCDs Start: 02/23/22 1736 ? ? ?Level of Care: Level of care: Med-Surg ?Family Communication: none at bedside.  ?Disposition Plan:     Remains inpatient appropriate:  currently waiting for SNF for discharge. / IV antibiotics.  ? ?Procedures:  ?IR paracentesis.  ? ?Consultants:   ?Palliative care  ?Nephrology.  ? ?Antimicrobials:  ? ?Anti-infectives (From admission, onward)  ? ? Start     Dose/Rate Route Frequency Ordered Stop  ? 03/06/22 0000  cefTRIAXone (ROCEPHIN) 2 g in sodium chloride 0.9 % 100 mL IVPB       ? 2 g ?200 mL/hr over 30 Minutes Intravenous Every 24 hours 03/05/22 1250    ? 02/24/22 1000  cefOXitin (MEFOXIN) 2 g in sodium chloride 0.9 % 100 mL IVPB       ? 2 g ?200 mL/hr over 30 Minutes Intravenous  Once 02/24/22 0956 02/24/22 1041  ? 02/23/22 1830  vancomycin (VANCOCIN) capsule 125 mg       ? 125 mg Oral 4 times daily 02/23/22 1735 03/05/22 1759  ? ?  ? ? ? ?Medications ? ?Scheduled Meds: ? (feeding supplement) PROSource Plus  30 mL Oral BID BM  ? allopurinol  200 mg Oral Daily  ? amiodarone  200 mg Oral Daily  ? Chlorhexidine Gluconate Cloth  6 each Topical Daily  ? feeding supplement  1 Container Oral TID BM  ? Gerhardt's butt cream   Topical TID  ? levothyroxine  200 mcg Oral q morning  ? midodrine  15 mg Oral TID WC  ? multivitamin  with minerals  1 tablet Oral Daily  ? pantoprazole  40 mg Oral BID  ? sertraline  50 mg Oral Daily  ? sodium bicarbonate  650 mg Oral TID  ? ?Continuous Infusions: ? cefTRIAXone (ROCEPHIN)  IV 2 g (03/06/22 0022)  ? ?PRN Meds:.acetaminophen **OR** acetaminophen, lidocaine (PF), ondansetron **OR** ondansetron (ZOFRAN) IV ? ? ? ?Subjective:  ? ?Lashya Passe was seen and examined today. She denies any abdominal pain or sob.  ?She reports being exhausted.  ? ?Objective:  ? ?Vitals:  ? 03/05/22 1651 03/05/22 2253 03/06/22 2778 03/06/22 0748  ?BP: (!) 104/57 (!) 95/55 (!) 95/53 Marland Kitchen)  85/45  ?Pulse: (!) 107 100 (!) 101 (!) 101  ?Resp: '18 18 18 17  '$ ?Temp: 98.4 ?F (36.9 ?C) 98 ?F (36.7 ?C) 98.4 ?F (36.9 ?C) 98.3 ?F (36.8 ?C)  ?TempSrc: Oral Oral Oral Oral  ?SpO2: 98% 97% 98% 99%  ?Weight:   74.5 kg   ?Height:      ? ? ?Intake/Output Summary (Last 24 hours) at 03/06/2022 0912 ?Last data filed at 03/05/2022 1506 ?Gross per 24 hour  ?Intake 40.56 ml  ?Output --  ?Net 40.56 ml  ? ? ?Filed Weights  ? 03/01/22 0500 03/03/22 0500 03/06/22 0613  ?Weight: 80.8 kg 81 kg 74.5 kg  ? ? ? ?Exam ? ?General exam: chronically ill appearing lady not in distress.  ?Respiratory system: diminished air entry at bases. On RA.  ?Cardiovascular system: S1 & S2 heard, RRR. No JVD,  No pedal edema. ?Gastrointestinal system: Abdomen is  soft, distention improves, non tender,  Normal bowel sounds heard. ?Central nervous system: Alert and oriented to person and place. Answering simple questions.  ?Extremities: no pedal edema.  ?Skin: No rashes seen.  ?Psychiatry:  flat affect.  ? ? ? ?Data Reviewed:  I have personally reviewed following labs and imaging studies ? ? ?CBC ?Lab Results  ?Component Value Date  ? WBC 24.5 (H) 03/06/2022  ? RBC 3.29 (L) 03/06/2022  ? HGB 8.7 (L) 03/06/2022  ? HCT 26.8 (L) 03/06/2022  ? MCV 81.5 03/06/2022  ? MCH 26.4 03/06/2022  ? PLT 142 (L) 03/06/2022  ? MCHC 32.5 03/06/2022  ? RDW 22.5 (H) 03/06/2022  ? LYMPHSABS 0.9  03/06/2022  ? MONOABS 0.8 03/06/2022  ? EOSABS 0.1 03/06/2022  ? BASOSABS 0.1 03/06/2022  ? ? ? ?Last metabolic panel ?Lab Results  ?Component Value Date  ? NA 131 (L) 03/06/2022  ? K 3.2 (L) 03/06/2022  ? CL 102 03/07/19

## 2022-03-06 NOTE — Progress Notes (Signed)
Occupational Therapy Treatment ?Patient Details ?Name: Diane Pruitt ?MRN: [id number] ?DOB: 1951-01-03 ?Today's Date: 03/06/2022 ? ? ?History of present illness 71 yo female presenitng 4/28 from [facility] for TIPS procedure on 4/29. The pt had been admitted 4/25 - 4/28 due to hypertensive gastropathy. Of note, pt also admitted 4/3-4/6 for upper GI bleed and was d/c to SNF. PMH includes: depression, afib, ischemic stroke, CKD III, and alcoholic liver cirrhosis. ?  ?OT comments ? Pt making steady progress towards OT goals this session. Session limited by hypotension this session but pt was able to progress OOB to recliner and to [facility] for + BM however BP remained low with pt needing to return back to bed. Pt continues to requires MIN A for UB ADLS and MAX A for LB ADLS. Pt completes ADL tranfers with rw and MINA  +2 for safety. Pt would continue to benefit from skilled occupational therapy while admitted and after d/c to address the below listed limitations in order to improve overall functional mobility and facilitate independence with BADL participation. DC plan remains appropriate, will follow acutely per POC.  ? ?Pt on RA during session with SpO2 >90% during session. pt with soft BP during session  ?recliner- 89/48 ( 60) ?recliner with legs up 79/53 ( 61) ?back in bed 93/59 ( 69) ?  ? ?Recommendations for follow up therapy are one component of a multi-disciplinary discharge planning process, led by the attending physician.  Recommendations may be updated based on patient status, additional functional criteria and insurance authorization. ?   ?Follow Up Recommendations ? Skilled nursing-short term rehab (<3 hours/day)  ?  ?Assistance Recommended at Discharge Frequent or constant Supervision/Assistance  ?Patient can return home with the following ? A lot of help with walking and/or transfers;A lot of help with bathing/dressing/bathroom;Assistance with cooking/housework;Assist for transportation;Help with  stairs or ramp for entrance ?  ?Equipment Recommendations ? Other (comment) (defer to next venue)  ?  ?Recommendations for Other Services   ? ?  ?Precautions / Restrictions Precautions ?Precautions: Fall;Other (comment) ?Precaution Comments: watch soft BP ?Restrictions ?Weight Bearing Restrictions: No  ? ? ?  ? ?Mobility Bed Mobility ?Overal bed mobility: Needs Assistance ?Bed Mobility: Supine to Sit ?  ?  ?Supine to sit: Min assist, HOB elevated ?  ?  ?General bed mobility comments: MIN A to elevate trunk, cues needed to sequence steps, +time and effort ?  ? ?Transfers ?Overall transfer level: Needs assistance ?Equipment used: Rolling walker (2 wheels) ?Transfers: Sit to/from Stand, Bed to chair/wheelchair/BSC ?Sit to Stand: Min assist, +2 safety/equipment ?Stand pivot transfers: Min assist, +2 safety/equipment ?  ?  ?  ?  ?General transfer comment: MINA  +2 for safety to rise from EOB and BSC, cues for safety and hand placment ?  ?  ?Balance Overall balance assessment: Needs assistance ?Sitting-balance support: Feet supported, No upper extremity supported ?Sitting balance-Leahy Scale: Fair ?  ?  ?Standing balance support: Bilateral upper extremity supported, Reliant on assistive device for balance ?Standing balance-Leahy Scale: Poor ?Standing balance comment: reliant on external assist ?  ?  ?  ?  ?  ?  ?  ?  ?  ?  ?  ?   ? ?ADL either performed or assessed with clinical judgement  ? ?ADL Overall ADL's : Needs assistance/impaired ?  ?  ?  ?  ?  ?  ?Lower Body Bathing: Maximal assistance;Sit to/from stand ?Lower Body Bathing Details (indicate cue type and reason): simulated via posterior pericare ?Upper Body  Dressing : Minimal assistance;Sitting ?Upper Body Dressing Details (indicate cue type and reason): to don new gown ?Lower Body Dressing: Maximal assistance;Sit to/from stand ?Lower Body Dressing Details (indicate cue type and reason): to don socks ?Toilet Transfer: Minimal assistance;+2 for  safety/equipment;BSC/3in1;Stand-pivot;Cueing for safety ?Toilet Transfer Details (indicate cue type and reason): stand pivot to [facility] with RW, cues for safety and hand placment ?Toileting- Clothing Manipulation and Hygiene: Maximal assistance;Sit to/from stand ?Toileting - Clothing Manipulation Details (indicate cue type and reason): for posterior/anterior pericare ?  ?  ?Functional mobility during ADLs: Minimal assistance;Rolling walker (2 wheels);+2 for safety/equipment;Cueing for safety;Cueing for sequencing ?General ADL Comments: emphasis on ADL transfers, OOB tolerance, and BADL reeducation ?  ? ?Extremity/Trunk Assessment Upper Extremity Assessment ?Upper Extremity Assessment: Generalized weakness ?  ?Lower Extremity Assessment ?Lower Extremity Assessment: Defer to PT evaluation ?  ?Cervical / Trunk Assessment ?Cervical / Trunk Assessment: Kyphotic ?  ? ?Vision Baseline Vision/History: 1 Wears glasses ?Patient Visual Report: No change from baseline (per gross assessment) ?  ?  ?Perception Perception ?Perception: Within Functional Limits ?  ?Praxis Praxis ?Praxis: Intact ?  ? ?Cognition Arousal/Alertness: Awake/alert ?Behavior During Therapy: Flat affect, Anxious (anxious at times) ?Overall Cognitive Status: Within Functional Limits for tasks assessed ?  ?  ?  ?  ?  ?  ?  ?  ?  ?  ?  ?  ?  ?  ?  ?  ?General Comments: overall WFL, initially states shes "scared" to move but relaxes as session progresses ?  ?  ?   ?Exercises   ? ?  ?Shoulder Instructions   ? ? ?  ?General Comments pt initially on 2L but reports she does not wear supplmental O2 at baseline, doffed o2 with SpO2 >90% during session. pt with soft BP during session recliner- 89/48 ( 60), recliner with legs up 79/53 ( 61), back in bed 93/59 ( 69)  ? ? ?Pertinent Vitals/ Pain       Pain Assessment ?Pain Assessment: No/denies pain ? ?Home Living   ?  ?  ?  ?  ?  ?  ?  ?  ?  ?  ?  ?  ?  ?  ?  ?  ?  ?  ? ?  ?Prior Functioning/Environment    ?  ?  ?  ?    ? ?Frequency ? Min 2X/week  ? ? ? ? ?  ?Progress Toward Goals ? ?OT Goals(current goals can now be found in the care plan section) ? Progress towards OT goals: Progressing toward goals ? ?Acute Rehab OT Goals ?Patient Stated Goal: to go to rehab ?OT Goal Formulation: With patient ?Time For Goal Achievement: 03/10/22 ?Potential to Achieve Goals: Fair  ?Plan Discharge plan remains appropriate;Frequency remains appropriate   ? ?Co-evaluation ? ? ?   ?  ?  ?  ?  ? ?  ?AM-PAC OT "6 Clicks" Daily Activity     ?Outcome Measure ? ? Help from another person eating meals?: None ?Help from another person taking care of personal grooming?: A Little ?Help from another person toileting, which includes using toliet, bedpan, or urinal?: A Lot ?Help from another person bathing (including washing, rinsing, drying)?: A Lot ?Help from another person to put on and taking off regular upper body clothing?: A Little ?Help from another person to put on and taking off regular lower body clothing?: A Lot ?6 Click Score: 16 ? ?  ?End of Session Equipment Utilized During Treatment: Gait belt;Rolling  walker (2 wheels) ? ?OT Visit Diagnosis: Other abnormalities of gait and mobility (R26.89);Muscle weakness (generalized) (M62.81);Dizziness and giddiness (R42);History of falling (Z91.81) ?  ?Activity Tolerance Patient tolerated treatment well ?  ?Patient Left in bed;with call bell/phone within reach;with bed alarm set ?  ?Nurse Communication Mobility status ?  ? ?   ? ?Time: 3818-4037 ?OT Time Calculation (min): 32 min ? ?Charges: OT General Charges ?$OT Visit: 1 Visit ?OT Treatments ?$Self Care/Home Management : 23-37 mins ? ?Corinne Ports K., COTA/L ?Acute Rehabilitation Services ?[phone] ? ? ?Precious Haws ?03/06/2022, 3:13 PM ?

## 2022-03-07 DIAGNOSIS — Z7401 Bed confinement status: Secondary | ICD-10-CM | POA: Diagnosis not present

## 2022-03-07 DIAGNOSIS — N179 Acute kidney failure, unspecified: Secondary | ICD-10-CM | POA: Diagnosis not present

## 2022-03-07 DIAGNOSIS — E039 Hypothyroidism, unspecified: Secondary | ICD-10-CM | POA: Diagnosis not present

## 2022-03-07 DIAGNOSIS — I480 Paroxysmal atrial fibrillation: Secondary | ICD-10-CM | POA: Diagnosis not present

## 2022-03-07 DIAGNOSIS — R69 Illness, unspecified: Secondary | ICD-10-CM | POA: Diagnosis not present

## 2022-03-07 DIAGNOSIS — D62 Acute posthemorrhagic anemia: Secondary | ICD-10-CM | POA: Diagnosis not present

## 2022-03-07 DIAGNOSIS — A498 Other bacterial infections of unspecified site: Secondary | ICD-10-CM | POA: Diagnosis not present

## 2022-03-07 DIAGNOSIS — R531 Weakness: Secondary | ICD-10-CM | POA: Diagnosis not present

## 2022-03-07 DIAGNOSIS — I959 Hypotension, unspecified: Secondary | ICD-10-CM | POA: Diagnosis not present

## 2022-03-07 DIAGNOSIS — I951 Orthostatic hypotension: Secondary | ICD-10-CM | POA: Diagnosis not present

## 2022-03-07 LAB — CBC WITH DIFFERENTIAL/PLATELET
Abs Immature Granulocytes: 0.24 10*3/uL — ABNORMAL HIGH (ref 0.00–0.07)
Basophils Absolute: 0.1 10*3/uL (ref 0.0–0.1)
Basophils Relative: 0 %
Eosinophils Absolute: 0.3 10*3/uL (ref 0.0–0.5)
Eosinophils Relative: 1 %
HCT: 27.4 % — ABNORMAL LOW (ref 36.0–46.0)
Hemoglobin: 9.3 g/dL — ABNORMAL LOW (ref 12.0–15.0)
Immature Granulocytes: 1 %
Lymphocytes Relative: 3 %
Lymphs Abs: 0.8 10*3/uL (ref 0.7–4.0)
MCH: 27.4 pg (ref 26.0–34.0)
MCHC: 33.9 g/dL (ref 30.0–36.0)
MCV: 80.6 fL (ref 80.0–100.0)
Monocytes Absolute: 1 10*3/uL (ref 0.1–1.0)
Monocytes Relative: 4 %
Neutro Abs: 22 10*3/uL — ABNORMAL HIGH (ref 1.7–7.7)
Neutrophils Relative %: 91 %
Platelets: 160 10*3/uL (ref 150–400)
RBC: 3.4 MIL/uL — ABNORMAL LOW (ref 3.87–5.11)
RDW: 23.4 % — ABNORMAL HIGH (ref 11.5–15.5)
WBC: 24.4 10*3/uL — ABNORMAL HIGH (ref 4.0–10.5)
nRBC: 0 % (ref 0.0–0.2)

## 2022-03-07 LAB — COMPREHENSIVE METABOLIC PANEL
ALT: 58 U/L — ABNORMAL HIGH (ref 0–44)
AST: 177 U/L — ABNORMAL HIGH (ref 15–41)
Albumin: 2.8 g/dL — ABNORMAL LOW (ref 3.5–5.0)
Alkaline Phosphatase: 138 U/L — ABNORMAL HIGH (ref 38–126)
Anion gap: 12 (ref 5–15)
BUN: 87 mg/dL — ABNORMAL HIGH (ref 8–23)
CO2: 17 mmol/L — ABNORMAL LOW (ref 22–32)
Calcium: 8.3 mg/dL — ABNORMAL LOW (ref 8.9–10.3)
Chloride: 103 mmol/L (ref 98–111)
Creatinine, Ser: 2.95 mg/dL — ABNORMAL HIGH (ref 0.44–1.00)
GFR, Estimated: 17 mL/min — ABNORMAL LOW (ref >60)
Glucose, Bld: 112 mg/dL — ABNORMAL HIGH (ref 70–99)
Potassium: 4 mmol/L (ref 3.5–5.1)
Sodium: 132 mmol/L — ABNORMAL LOW (ref 135–145)
Total Bilirubin: 1.5 mg/dL — ABNORMAL HIGH (ref 0.3–1.2)
Total Protein: 5.8 g/dL — ABNORMAL LOW (ref 6.5–8.1)

## 2022-03-07 MED ORDER — MIDODRINE HCL 5 MG PO TABS: 15.0000 mg | ORAL_TABLET | Freq: Three times a day (TID) | ORAL | Status: AC

## 2022-03-07 MED ORDER — VANCOMYCIN HCL 125 MG PO CAPS
125.0000 mg | ORAL_CAPSULE | Freq: Four times a day (QID) | ORAL | 0 refills | Status: AC
Start: 2022-03-07 — End: 2022-03-12

## 2022-03-07 MED ORDER — CIPROFLOXACIN HCL 500 MG PO TABS: 500.0000 mg | ORAL_TABLET | Freq: Every day | ORAL | 0 refills | Status: DC

## 2022-03-07 MED ORDER — CIPROFLOXACIN HCL 500 MG PO TABS
500.0000 mg | ORAL_TABLET | Freq: Two times a day (BID) | ORAL | 0 refills | Status: DC
Start: 2022-03-07 — End: 2022-03-07

## 2022-03-07 MED ORDER — LACTULOSE 10 GM/15ML PO SOLN
10.0000 g | Freq: Two times a day (BID) | ORAL | 0 refills | Status: AC | PRN
Start: 2022-03-07 — End: ?

## 2022-03-07 MED ORDER — SODIUM BICARBONATE 650 MG PO TABS: 650.0000 mg | ORAL_TABLET | Freq: Three times a day (TID) | ORAL | Status: AC

## 2022-03-07 NOTE — Progress Notes (Signed)
Physical Therapy Treatment ?Patient Details ?Name: Diane Pruitt ?MRN: [id number] ?DOB: November 29, 1950 ?Today's Date: 03/07/2022 ? ? ?History of Present Illness 71 yo female presenitng 4/28 from [facility] for TIPS procedure on 4/29. The pt had been admitted 4/25 - 4/28 due to hypertensive gastropathy. Of note, pt also admitted 4/3-4/6 for upper GI bleed and was d/c to SNF. PMH includes: depression, afib, ischemic stroke, CKD III, and alcoholic liver cirrhosis. ? ?  ?PT Comments  ? ? Patient progressing slowly towards PT goals. Session focused on gait training requiring Min A for support and use of RW. Pt highly anxious and fearful of moving this session, crying appreciated and comforted. Requires encouragement and light coaxing, cooperative. Pt asymptomatic during session. Assist to open things on breakfast tray. Plans to d/c SNF today. Will follow. ?  ?Recommendations for follow up therapy are one component of a multi-disciplinary discharge planning process, led by the attending physician.  Recommendations may be updated based on patient status, additional functional criteria and insurance authorization. ? ?Follow Up Recommendations ? Skilled nursing-short term rehab (<3 hours/day) ?  ?  ?Assistance Recommended at Discharge Frequent or constant Supervision/Assistance  ?Patient can return home with the following A little help with walking and/or transfers;A little help with bathing/dressing/bathroom;Assistance with cooking/housework;Direct supervision/assist for medications management;Assist for transportation;Help with stairs or ramp for entrance ?  ?Equipment Recommendations ? None recommended by PT  ?  ?Recommendations for Other Services   ? ? ?  ?Precautions / Restrictions Precautions ?Precautions: Fall;Other (comment) ?Precaution Comments: watch soft BP ?Restrictions ?Weight Bearing Restrictions: No  ?  ? ?Mobility ? Bed Mobility ?Overal bed mobility: Needs Assistance ?Bed Mobility: Rolling, Sidelying to  Sit ?Rolling: Min guard ?Sidelying to sit: Min assist, HOB elevated ?  ?  ?  ?General bed mobility comments: Assist to elevate trunk to get to EOB with increased effort/time. Rolling to get off bed pan, Min guard ?  ? ?Transfers ?Overall transfer level: Needs assistance ?Equipment used: Rolling walker (2 wheels) ?Transfers: Sit to/from Stand ?Sit to Stand: Min assist ?  ?  ?  ?  ?  ?General transfer comment: MIn A to power to standing from EOB x1, from chair x1 with posterior bias and cues for hand placement/anterior weight shift. ?  ? ?Ambulation/Gait ?Ambulation/Gait assistance: Min assist ?Gait Distance (Feet): 10 Feet (+ 12') ?Assistive device: Rolling walker (2 wheels) ?Gait Pattern/deviations: Step-through pattern, Narrow base of support, Scissoring, Decreased step length - left, Decreased step length - right ?Gait velocity: decreased ?Gait velocity interpretation: <1.31 ft/sec, indicative of household ambulator ?  ?General Gait Details: Slow, mildly unsteady gait with scissoring x1 and narrow BoS, Min A for balance. Anxious. 1 seated rest break. ? ? ?Stairs ?  ?  ?  ?  ?  ? ? ?Wheelchair Mobility ?  ? ?Modified Rankin (Stroke Patients Only) ?  ? ? ?  ?Balance Overall balance assessment: Needs assistance ?Sitting-balance support: Feet supported, No upper extremity supported ?Sitting balance-Leahy Scale: Fair ?Sitting balance - Comments: Prefers back support ?  ?Standing balance support: During functional activity, Reliant on assistive device for balance ?Standing balance-Leahy Scale: Poor ?Standing balance comment: reliant on external assist and close Min guard for static standing and Min A for dynamic standing ?  ?  ?  ?  ?  ?  ?  ?  ?  ?  ?  ?  ? ?  ?Cognition Arousal/Alertness: Awake/alert ?Behavior During Therapy: Flat affect, Anxious ?Overall Cognitive Status: Within Functional Limits for  tasks assessed ?  ?  ?  ?  ?  ?  ?  ?  ?  ?  ?  ?  ?  ?  ?  ?  ?General Comments: overall WFL, crying and fearful of  falling during session scared to mobilize. Needs encouragement. ?  ?  ? ?  ?Exercises   ? ?  ?General Comments General comments (skin integrity, edema, etc.): Pt asymptomatic during session. ?  ?  ? ?Pertinent Vitals/Pain Pain Assessment ?Pain Assessment: No/denies pain  ? ? ?Home Living   ?  ?  ?  ?  ?  ?  ?  ?  ?  ?   ?  ?Prior Function    ?  ?  ?   ? ?PT Goals (current goals can now be found in the care plan section) Progress towards PT goals: Progressing toward goals ? ?  ?Frequency ? ? ? Min 2X/week ? ? ? ?  ?PT Plan Current plan remains appropriate  ? ? ?Co-evaluation   ?  ?  ?  ?  ? ?  ?AM-PAC PT "6 Clicks" Mobility   ?Outcome Measure ? Help needed turning from your back to your side while in a flat bed without using bedrails?: A Little ?Help needed moving from lying on your back to sitting on the side of a flat bed without using bedrails?: A Little ?Help needed moving to and from a bed to a chair (including a wheelchair)?: A Little ?Help needed standing up from a chair using your arms (e.g., wheelchair or bedside chair)?: A Little ?Help needed to walk in hospital room?: Total ?Help needed climbing 3-5 steps with a railing? : Total ?6 Click Score: 14 ? ?  ?End of Session Equipment Utilized During Treatment: Gait belt ?Activity Tolerance: Patient tolerated treatment well;Other (comment) (self limiting due to anxiety and fear of moving) ?Patient left: in chair;with call bell/phone within reach;with chair alarm set ?Nurse Communication: Mobility status ?PT Visit Diagnosis: Other abnormalities of gait and mobility (R26.89);Unsteadiness on feet (R26.81);Muscle weakness (generalized) (M62.81) ?  ? ? ?Time: 4627-0350 ?PT Time Calculation (min) (ACUTE ONLY): 18 min ? ?Charges:  $Gait Training: 8-22 mins          ?          ? ?Marisa Severin, PT, DPT ?Acute Rehabilitation Services ?Secure chat preferred ?Office [phone] ? ? ? ? ? ?Verdi ?03/07/2022, 9:27 AM ? ?

## 2022-03-07 NOTE — Progress Notes (Signed)
Pt discharged to [facility] by [facility] via stretcher with all belongings. ?

## 2022-03-07 NOTE — Progress Notes (Signed)
Report called to [facility]. Nurse verbalized understanding of all teaching and had no further questions. Patient will be being picked up at 2pm. ?

## 2022-03-07 NOTE — [place] FL2 (Signed)
?[place] MEDICAID FL2 LEVEL OF CARE SCREENING TOOL  ?  ? ?IDENTIFICATION  ?Patient Name: ?Diane Pruitt Birthdate: 08/03/1951 Sex: female Admission Date (Current Location): ?02/23/2022  ?[place] and [place] Number: ? [place] ?  Facility and Address:  ?[facility], [place] [address], [place], [place] [address] ?     Provider Number: ?[id number]  ?Attending Physician Name and Address:  ?[place] Eric J, DO ? Relative Name and Phone Number:  ?  ?   ?Current Level of Care: ?Hospital Recommended Level of Care: ?[place] Prior Approval Number: ?  ? ?Date Approved/Denied: ?  PASRR Number: ?pending ? ?Discharge Plan: ?SNF ?  ? ?Current Diagnoses: ?Patient Active Problem List  ? Diagnosis Date Noted  ? Hyperkalemia 02/22/2022  ? Abnormal urinalysis 02/22/2022  ? Portal vein thrombosis 02/22/2022  ? Ascites due to alcoholic cirrhosis (HCC)   ? Clostridium difficile infection 02/21/2022  ? 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 Orthostatic hypotension with weakness and falls  01/29/2022  ? Hyponatremia 01/29/2022  ? Weakness 01/29/2022  ? Portal hypertensive gastropathy ([place]) 01/25/2022  ? Hyperlipidemia 01/21/2022  ? Hypokalemia 01/06/2022  ? Acute blood loss anemia 01/04/2022  ? 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 AKI (acute kidney injury) ([place]) 01/04/2022  ? Elevated LFTs 01/04/2022  ? Gout 01/04/2022  ? Depression 01/04/2022  ? GI bleed 01/04/2022  ? Iron deficiency anemia due to chronic blood loss 01/03/2022  ? Unspecified abnormal finding in specimens from other organs, systems and tissues 11/20/2021  ? Age-related osteoporosis without current pathological fracture 11/01/2021  ? Chronic gouty arthritis 11/01/2021  ? Excessive sleepiness 11/01/2021  ? History of embolic stroke 81/19/1478  ? History of tobacco use 11/01/2021  ? Hypercoagulable state ([place]) 11/01/2021  ? Iron deficiency anemia 11/01/2021  ? Obesity due  to excess calories 11/01/2021  ? Personal history of colonic polyps 11/01/2021  ? Polyp of colon 11/01/2021  ? Severe major depression, single episode, without psychotic features ([place]) 11/01/2021  ? Acquired hypothyroidism 02/12/2020  ? Compression fracture of L2 ([place]) 02/01/2020  ? Low back pain 01/18/2020  ? Pain of right hip joint 01/18/2020  ? Paroxysmal atrial fibrillation ([place]) 01/25/2018  ? Cyst of finger 01/23/2018  ? ? ?Orientation RESPIRATION BLADDER Height & Weight   ?  ?Self, Situation, Time, Place ? Normal Incontinent Weight: 164 lb 3.9 oz (74.5 kg) ?Height:  '5\' 2"'$  (157.5 cm)  ?BEHAVIORAL SYMPTOMS/MOOD NEUROLOGICAL BOWEL NUTRITION STATUS  ?    Incontinent Diet  ?AMBULATORY STATUS COMMUNICATION OF NEEDS Skin   ?Extensive Assist Verbally Normal ?  ?  ?  ?    ?     ?     ? ? ?Personal Care Assistance Level of Assistance  ?Bathing, Feeding, Dressing Bathing Assistance: Maximum assistance ?Feeding assistance: Maximum assistance ?Dressing Assistance: Maximum assistance ?   ? ?Functional Limitations Info  ?Sight, Hearing, Speech Sight Info: Adequate ?Hearing Info: Adequate ?Speech Info: Adequate  ? ? ?SPECIAL CARE FACTORS FREQUENCY  ?PT (By licensed PT), OT (By licensed OT)   ?  ?PT Frequency: 5x a week ?OT Frequency: 5x aweek ?  ?  ?  ?   ? ? ?Contractures Contractures Info: Not present  ? ? ?Additional Factors Info  ?Code Status, Allergies, Isolation Precautions Code Status Info: DNR ?Allergies Info: Colchicine ?  ?  ?Isolation Precautions Info: Enteric ?   ? ?Current Medications (03/07/2022):  This is the current hospital  active medication list ?Current Facility-Administered Medications  ?Medication Dose Route Frequency Provider Last Rate Last Admin  ? (feeding supplement) PROSource Plus liquid 30 mL  30 mL Oral BID BM Barb Merino, MD   30 mL at 03/07/22 0940  ? acetaminophen (TYLENOL) tablet 650 mg  650 mg Oral Q6H PRN Barb Merino, MD   650 mg at 03/06/22 2125  ? Or  ? acetaminophen (TYLENOL)  suppository 650 mg  650 mg Rectal Q6H PRN Barb Merino, MD      ? allopurinol (ZYLOPRIM) tablet 200 mg  200 mg Oral Daily Barb Merino, MD   200 mg at 03/07/22 0940  ? amiodarone (PACERONE) tablet 200 mg  200 mg Oral Daily Barb Merino, MD   200 mg at 03/07/22 0940  ? cefTRIAXone (ROCEPHIN) 2 g in sodium chloride 0.9 % 100 mL IVPB  2 g Intravenous Q24H Hosie Poisson, MD 200 mL/hr at 03/06/22 2331 2 g at 03/06/22 2331  ? Chlorhexidine Gluconate Cloth 2 % PADS 6 each  6 each Topical Daily Barb Merino, MD   6 each at 03/07/22 0940  ? feeding supplement (BOOST / RESOURCE BREEZE) liquid 1 Container  1 Container Oral TID BM Barb Merino, MD   1 Container at 03/07/22 0940  ? Gerhardt's butt cream   Topical TID Hosie Poisson, MD   Given at 03/07/22 0940  ? levothyroxine (SYNTHROID) tablet 200 mcg  200 mcg Oral q morning Barb Merino, MD   200 mcg at 03/07/22 0500  ? lidocaine (PF) (XYLOCAINE) 1 % injection    PRN Soyla Dryer R, NP   10 mL at 03/05/22 1009  ? midodrine (PROAMATINE) tablet 15 mg  15 mg Oral TID WC Barb Merino, MD   15 mg at 03/07/22 0900  ? multivitamin with minerals tablet 1 tablet  1 tablet Oral Daily Barb Merino, MD   1 tablet at 03/07/22 0940  ? ondansetron (ZOFRAN) tablet 4 mg  4 mg Oral Q6H PRN Barb Merino, MD      ? Or  ? ondansetron (ZOFRAN) injection 4 mg  4 mg Intravenous Q6H PRN Barb Merino, MD   4 mg at 03/05/22 0911  ? pantoprazole (PROTONIX) EC tablet 40 mg  40 mg Oral BID Barb Merino, MD   40 mg at 03/07/22 0940  ? sertraline (ZOLOFT) tablet 50 mg  50 mg Oral Daily Barb Merino, MD   50 mg at 03/07/22 0940  ? sodium bicarbonate tablet 650 mg  650 mg Oral TID Elmarie Shiley, MD   650 mg at 03/07/22 0940  ? ? ? ?Discharge Medications: ?Please see discharge summary for a list of discharge medications. ? ?Relevant Imaging Results: ? ?Relevant Lab Results: ? ? ?Additional Information ?[phone], Both Covid Vaccines and 1 Booster ? ?Emeterio Reeve, LCSW ? ? ? ? ?

## 2022-03-07 NOTE — Discharge Summary (Addendum)
Physician Discharge Summary  Diane Pruitt WUJ:[id number] DOB: [birth year] DOA: 02/23/2022  PCP: Sigmund Hazel, MD  Admit date: 02/23/2022 Discharge date: 03/07/2022  Admitted From: SNF Disposition: [facility] with Hospice   Recommendations for Outpatient Follow-up:  Follow up with PCP in 1-2 weeks Follow-up with [facility] gastroenterology 2 weeks, Dr. Marca Ancona Follow-up with hospice provider on discharge Continue ciprofloxacin 500 mg p.o. daily for possible SBP for 5 additional days (renally dosed) Continue vancomycin 125 mg p.o. 4 times daily until completion of ciprofloxacin as above  Discharge Condition: Stable, but overall long-term prognosis poor CODE STATUS: DNR Diet recommendation: Low-salt diet  History of present illness:  Diane Pruitt is a 71 year old female with past medical history significant for depression, paroxysmal atrial fibrillation not on anticoagulation, history of ischemic CVA, CKD stage IIIa, EtOH cirrhosis, hypertensive gastropathy who presented to [facility] hospital via transfer from [facility] on 4/28 for consideration of IR TIPS procedure.    Patient originally admitted at [facility] on 4/25 with acute on chronic renal failure consistent with hepatorenal syndrome and was found to be C. difficile positive.    Hospital course:  EtOH cirrhosis with portal hypertensive gastropathy Recurrent ascites Possible SBP Patient transferred from [facility] for consideration of IR TIPS procedure.  Unfortunately IR unable to perform TIPS procedure due to difficult anatomy.  During the hospitalization patient underwent paracentesis on 4/25, 4/29, and 5/8.  Ascitic fluid Gram stain's negative and ascitic cultures show no growth during the hospital course.  Due to concern with leukocytosis, patient was started on empiric ceftriaxone for possible underlying SBP.  Palliative care was consulted now electing to return back to SNF under hospice care.  We will continue  antibiotics with ciprofloxacin 500 mg p.o. daily renally dosed for possible SBP.  Home spironolactone and Lasix were discontinued due to worsening renal function.  Continue weekly paracentesis for comfort.  Acute renal failure on CKD stage IIIa Non-anion gap metabolic acidosis Etiology likely secondary to hepatorenal syndrome.  Nephrology was consulted and followed during hospital course.  Patient was supported with albumin infusions during paracentesis.  Home spironolactone and Lasix were discontinued.  Home midodrine increased to 50 mg p.o. 3 times daily.  Now transitioning to hospice as above.  C. difficile colitis Patient completed 10-day course of oral vancomycin while inpatient, will extend course while continues on antibiotics as above with ciprofloxacin on discharge.  Hypothyroidism Continue levothyroxine 200 mcg p.o. daily  Hypomagnesemia Repleted during hospitalization  Acute metabolic encephalopathy Mild hepatic encephalopathy Ammonia level 46, slightly high improved with lactulose.  Ensure that patient has 2-3 soft bowel movements daily.  Lactulose as needed.  Hyponatremia Etiology likely secondary to fluid overload from underlying cirrhosis.  Improved to 132 at time of discharge.  Hypertension Continue midodrine increased dose of 15 mg p.o. 3 times daily.  Anemia of chronic medical disease. Hemoglobin stable, 9.3 at time of discharge.  Paroxysmal atrial fibrillation Continue amiodarone 200 mg p.o. twice daily.  Not on anticoagulation.  Thrombocytopenia, mild Resolved  Hyperkalemia. Hypokalemia Resolved  Moderate protein calorie malnutrition Nutrition Status: Nutrition Problem: Increased nutrient needs Etiology: chronic illness (cirrhosis) Signs/Symptoms: estimated needs Interventions: Refer to RD note for recommendations Dietitian was consulted and followed during hospital course.    Discharge Diagnoses:  Principal Problem:   Cirrhosis of liver with  portal HTN gastropathy via EGD 01/05/22  Active Problems:   Acute blood loss anemia   Orthostatic hypotension with weakness and falls    AKI (acute kidney injury) (HCC)  Acquired hypothyroidism   Depression   Paroxysmal atrial fibrillation (HCC)   Clostridium difficile infection    Discharge Instructions  Discharge Instructions     Diet - low sodium heart healthy   Complete by: As directed    Increase activity slowly   Complete by: As directed    No wound care   Complete by: As directed       Allergies as of 03/07/2022       Reactions   Colchicine Other (See Comments)   Stomach upset, diarrhea Other reaction(s): stomach upset        Medication List     STOP taking these medications    furosemide 20 MG tablet Commonly known as: LASIX   spironolactone 50 MG tablet Commonly known as: ALDACTONE       TAKE these medications    acetaminophen 500 MG tablet Commonly known as: TYLENOL Take 1,000 mg by mouth every 6 (six) hours as needed for headache or mild pain.   alendronate 70 MG tablet Commonly known as: FOSAMAX Take 70 mg by mouth every Monday.   allopurinol 100 MG tablet Commonly known as: ZYLOPRIM Take 200 mg by mouth daily.   amiodarone 200 MG tablet Commonly known as: PACERONE Take 200 mg by mouth in the morning and at bedtime.   ciprofloxacin 500 MG tablet Commonly known as: Cipro Take 1 tablet (500 mg total) by mouth daily with breakfast for 5 days.   iron polysaccharides 150 MG capsule Commonly known as: NIFEREX Take 1 capsule (150 mg total) by mouth daily.   lactulose 10 GM/15ML solution Commonly known as: CHRONULAC Take 15 mLs (10 g total) by mouth 2 (two) times daily as needed for mild constipation (Ensure 2-3 soft bowel movements daily).   levothyroxine 200 MCG tablet Commonly known as: SYNTHROID Take 200 mcg by mouth every morning.   midodrine 5 MG tablet Commonly known as: PROAMATINE Take 3 tablets (15 mg total) by mouth 3  (three) times daily with meals. What changed:  medication strength how much to take   pantoprazole 40 MG tablet Commonly known as: Protonix Take 1 tablet (40 mg total) by mouth 2 (two) times daily before a meal.   polyethylene glycol 17 g packet Commonly known as: MIRALAX / GLYCOLAX Take 17 g by mouth daily as needed for mild constipation.   PRESERVISION AREDS 2 PO Take 1 capsule by mouth daily. Preservision AREDS-2 250 mg- 90 mg- 40 mg-1 mg   sodium bicarbonate 650 MG tablet Take 1 tablet (650 mg total) by mouth 3 (three) times daily.   vancomycin 125 MG capsule Commonly known as: VANCOCIN Take 1 capsule (125 mg total) by mouth 4 (four) times daily for 5 days.   Vitamin D 50 MCG (2000 UT) tablet Take 2,000 Units by mouth daily.        Follow-up Information     Sigmund Hazel, MD Follow up in 1 week(s).   Specialty: Family Medicine Contact information: [address] [place] [address] [phone]                Allergies  Allergen Reactions   Colchicine Other (See Comments)    Stomach upset, diarrhea Other reaction(s): stomach upset    Consultations: Nephrology Interventional radiology Palliative care   Procedures/Studies: DG Chest 1 View  Result Date: 02/24/2022 CLINICAL DATA:  Shortness of breath EXAM: CHEST  1 VIEW COMPARISON:  Previous studies including the examination of 02/20/2022 FINDINGS: Transverse diameter of heart is increased. Dense  calcification is seen in the mitral annulus. Central pulmonary vessels are prominent. Increased interstitial markings are seen in the parahilar regions. There are small linear densities in the right mid and both lower lung fields. Costophrenic angles are clear. There is no pneumothorax. IMPRESSION: Cardiomegaly. Central pulmonary vessels are more prominent suggesting CHF. There is increase in interstitial markings in the parahilar regions, possibly suggesting interstitial edema. There are linear densities  in the parahilar regions and lower lung fields suggesting subsegmental atelectasis. Electronically Signed   By: Ernie Avena M.D.   On: 02/24/2022 17:23   DG Chest 2 View  Result Date: 02/12/2022 CLINICAL DATA:  Shortness of breath. EXAM: CHEST - 2 VIEW COMPARISON:  None. FINDINGS: Low lung volumes are seen with mild areas of bibasilar atelectasis. There is no evidence of a pleural effusion or pneumothorax. The heart size and mediastinal contours are within normal limits. Marked severity curvilinear cardiac calcification is seen within the medial aspect of the cardiac silhouette. The visualized skeletal structures are unremarkable. IMPRESSION: Low lung volumes with mild bibasilar atelectasis. Electronically Signed   By: Aram Candela M.D.   On: 02/12/2022 00:29   US RENAL  Result Date: 02/22/2022 CLINICAL DATA:  Renal dysfunction EXAM: RENAL / URINARY TRACT ULTRASOUND COMPLETE COMPARISON:  CT done on 01/29/2022 FINDINGS: Right Kidney: Renal measurements: 9.5 x 5.2 by 4.1 cm = volume: 105 mL. Echogenicity within normal limits. No mass or hydronephrosis visualized. Left Kidney: Renal measurements: 9.2 x 4.7 x 4 cm = volume: 90 mL. Echogenicity within normal limits. No mass or hydronephrosis visualized. Bladder: Appears normal for degree of bladder distention. Other: Moderate to large ascites is present. There is nodularity in the liver surface. IMPRESSION: There is no hydronephrosis.  Moderate to large ascites. Electronically Signed   By: Ernie Avena M.D.   On: 02/22/2022 16:55   US Paracentesis  Result Date: 02/21/2022 INDICATION: Patient with cirrhosis and recurrent ascites, request received for diagnostic and therapeutic paracentesis. EXAM: ULTRASOUND GUIDED PARACENTESIS MEDICATIONS: Local 1% lidocaine only COMPLICATIONS: None immediate. PROCEDURE: Informed written consent was obtained from the patient after a discussion of the risks, benefits and alternatives to treatment. A timeout  was performed prior to the initiation of the procedure. Initial ultrasound scanning demonstrates a large amount of ascites within the right upper abdominal quadrant. The right upper abdomen was prepped and draped in the usual sterile fashion. 1% lidocaine was used for local anesthesia. Following this, a 19 gauge, 7-cm, Yueh catheter was introduced. An ultrasound image was saved for documentation purposes. The paracentesis was performed. The catheter was removed and a dressing was applied. The patient tolerated the procedure well without immediate post procedural complication. Patient received post-procedure intravenous albumin; see nursing notes for details. FINDINGS: A total of approximately 6.6 L of clear yellow fluid was removed. Samples were sent to the laboratory as requested by the clinical team. IMPRESSION: Successful ultrasound-guided paracentesis yielding 6.6 Liters liters of peritoneal fluid. Read by Pattricia Boss PA-C, PLAN: The patient has required >/=2 paracenteses in a 30 day period and a formal evaluation by the [facility] Interventional Radiology Portal Hypertension Clinic has been arranged. Roanna Banning, MD [facility] Radiology Electronically Signed   By: Roanna Banning M.D.   On: 02/21/2022 17:00   IR Tips  Result Date: 02/25/2022 CLINICAL DATA:  Briefly, 71 year old female with decompensated EtOH cirrhosis and portal hypertension. Liver Doppler with PV/SMV confluence thrombus. MELD = 18.  Child Pugh class B, 8 points. EXAM: Procedures: 1.  ABORTED TRANSJUGULAR INTRAHEPATIC PORTOSYSTEMIC SHUNT (TIPS) 2. CENTRAL AND PORTAL VENOUS MANOMETRY 3. PORTAL VENOGRAM 4. INTRACARDIAC ECHOCARDIOGRAPHY (ICE) 5. THERAPEUTIC PARACENTESIS Performing Physician: Roanna Banning, MD Assistant(s): Ruel Favors, MD A qualified trainee/resident or advanced practice provider (APP) was not immediately available to assist with this case. MEDICATIONS: As antibiotic prophylaxis,  Mefoxin 2 g IV was ordered pre-procedure and administered intravenously within one hour of incision. ANESTHESIA/SEDATION: Sedation by the Anesthesia Team was performed. Please see anesthesiology log for details. CONTRAST:  100 mL Visipaque 320, intravenous FLUOROSCOPY TIME:  Fluoroscopic dose; 368 mGy COMPLICATIONS: None immediate. PROCEDURE: Informed written consent was obtained from the patient and/or patient's representative after a thorough discussion of the procedural risks, benefits and alternatives. All questions were addressed. Maximal sterile barrier technique was utilized including caps, mask, sterile gowns, sterile gloves, sterile drape, hand hygiene and skin antiseptic. A timeout was performed prior to the initiation of the procedure. Initial ultrasound scanning demonstrates a large amount of ascites within the RIGHT abdomen. Under direct ultrasound guidance, an 8 Fr Safe-T-Centesis catheter was introduced. An ultrasound image was saved for documentation purposes. Therapeutic paracentesis was performed. A preliminary ultrasound of the RIGHT neck was performed and demonstrates a patent internal jugular vein. A permanent ultrasound image was recorded. Using a combination of fluoroscopy and ultrasound, an access site was determined. A small dermatotomy was made at the planned puncture site. Using ultrasound guidance, access into the RIGHT internal jugular vein was obtained with visualization of needle entry into the vessel using a standard micropuncture technique. A wire was advanced into the IVC and serial fascial dilation performed. A 10 French TIPS sheath was placed into the internal jugular vein and advanced to the IVC. A preliminary ultrasound of the RIGHT groin was performed and demonstrates a patent right common femoral vein. A permanent ultrasound image was recorded. Using a combination of fluoroscopy and ultrasound, an access site was determined. A small dermatotomy was made at the planned puncture  site. Using ultrasound guidance, access into the RIGHT common femoral vein was obtained with visualization of needle entry into the vessel using a standard micropuncture technique. A wire was advanced into the IVC insert all fascial dilation performed. An 8 [place], 11 cm vascular sheath was placed into the external iliac vein. Through this access site, an 41 [place] ICE catheter was advanced with ease under fluoroscopic guidance to the level of the intrahepatic inferior vena cava. The jugular sheath was retracted into the right atrium and manometry was performed. A 5 French angled tip catheter was then directed into the RIGHT hepatic vein. Hepatic venogram was performed. These images demonstrated patent hepatic vein with no stenosis. The catheter was advanced to a wedge portion of the a patent vein over which the 10 French sheath was advanced into the RIGHT hepatic vein. Using ICE ultrasound visualization the catheter as RIGHT hepatic vein as well as the portal anatomy was defined. A planned exit site from the hepatic vein and puncture site from the portal vein was placed into a single sonographic plane. Under direct ultrasound visualization, the Argon Scorpion X needle was advanced into the central RIGHT portal vein. Hand injection of contrast confirmed position within the portal system. A Glidewire was then advanced into the splenic vein. A 5 French marking pigtail catheter was then advanced over the wire into the main portal vein and wire removed. Portal venogram was performed which demonstrated a patent portal vein. RIGHT atrial and portal pressures were performed. The tract was then dilated with an  8 mm x 4 mm Mustang balloon. At this point, secondary to acute angulation with a standard RHV to RPV branch approach a TIPS stent placement would prove challenging. Multiple attempts were made at cannulating a middle hepatic vein, which appears to have a better trajectory for TIPS placement, though this was  unsuccessful. The decision was made to abort and reconvene at a later date. The paracentesis drain, catheters and sheath were removed and manual compression was applied to the right internal jugular and right common femoral venous access sites until hemostasis was achieved. The patient was transferred to the PACU in stable condition. Pre-TIPS Mean Pressures (mmHg): Right atrium: 20 Portal vein: 44 Portosystemic gradient: 24 FINDINGS: -access via the RIGHT jugular and femoral veins. - ICE-guided portal vein access, however acute angulation with a RHV to RPV branch approach. - Aborted TIPS secondary to challenging portal vein anatomy - Elevated portosystemic gradient of 24 mmHg, consistent with portal HTN (RA 20, PV 44) - Therapeutic paracentesis with 3.8L of serous ascites removed. IMPRESSION: 1. Portal venogram, central and portal venous manometry consistent with portal hypertension. Portosystemic gradient markedly elevated at 44 mmHg. 2. Aborted TIPS secondary to challenging anatomy, as detailed above. PLAN: Appreciate management by Cardiology, Nephrology and GI teams. We will plan on medical optimization, serial paracentesis for symptomatic relief, and return to Vascular Interventional Radiology (VIR) for interval TIPS placement at a later date. Roanna Banning, MD [facility] Inter[facility]iology [facility] Radiology Electronically Signed   By: Roanna Banning M.D.   On: 02/25/2022 12:45   IR US Guide Vasc Access Right  Result Date: 02/25/2022 CLINICAL DATA:  Briefly, 71 year old female with decompensated EtOH cirrhosis and portal hypertension. Liver Doppler with PV/SMV confluence thrombus. MELD = 18.  Child Pugh class B, 8 points. EXAM: Procedures: 1. ABORTED TRANSJUGULAR INTRAHEPATIC PORTOSYSTEMIC SHUNT (TIPS) 2. CENTRAL AND PORTAL VENOUS MANOMETRY 3. PORTAL VENOGRAM 4. INTRACARDIAC ECHOCARDIOGRAPHY (ICE) 5. THERAPEUTIC PARACENTESIS Performing Physician: Roanna Banning, MD Assistant(s): Ruel Favors,  MD A qualified trainee/resident or advanced practice provider (APP) was not immediately available to assist with this case. MEDICATIONS: As antibiotic prophylaxis, Mefoxin 2 g IV was ordered pre-procedure and administered intravenously within one hour of incision. ANESTHESIA/SEDATION: Sedation by the Anesthesia Team was performed. Please see anesthesiology log for details. CONTRAST:  100 mL Visipaque 320, intravenous FLUOROSCOPY TIME:  Fluoroscopic dose; 368 mGy COMPLICATIONS: None immediate. PROCEDURE: Informed written consent was obtained from the patient and/or patient's representative after a thorough discussion of the procedural risks, benefits and alternatives. All questions were addressed. Maximal sterile barrier technique was utilized including caps, mask, sterile gowns, sterile gloves, sterile drape, hand hygiene and skin antiseptic. A timeout was performed prior to the initiation of the procedure. Initial ultrasound scanning demonstrates a large amount of ascites within the RIGHT abdomen. Under direct ultrasound guidance, an 8 Fr Safe-T-Centesis catheter was introduced. An ultrasound image was saved for documentation purposes. Therapeutic paracentesis was performed. A preliminary ultrasound of the RIGHT neck was performed and demonstrates a patent internal jugular vein. A permanent ultrasound image was recorded. Using a combination of fluoroscopy and ultrasound, an access site was determined. A small dermatotomy was made at the planned puncture site. Using ultrasound guidance, access into the RIGHT internal jugular vein was obtained with visualization of needle entry into the vessel using a standard micropuncture technique. A wire was advanced into the IVC and serial fascial dilation performed. A 10 French TIPS sheath was placed into the internal jugular vein and advanced to the IVC. A  preliminary ultrasound of the RIGHT groin was performed and demonstrates a patent right common femoral vein. A permanent  ultrasound image was recorded. Using a combination of fluoroscopy and ultrasound, an access site was determined. A small dermatotomy was made at the planned puncture site. Using ultrasound guidance, access into the RIGHT common femoral vein was obtained with visualization of needle entry into the vessel using a standard micropuncture technique. A wire was advanced into the IVC insert all fascial dilation performed. An 8 [place], 11 cm vascular sheath was placed into the external iliac vein. Through this access site, an 67 [place] ICE catheter was advanced with ease under fluoroscopic guidance to the level of the intrahepatic inferior vena cava. The jugular sheath was retracted into the right atrium and manometry was performed. A 5 French angled tip catheter was then directed into the RIGHT hepatic vein. Hepatic venogram was performed. These images demonstrated patent hepatic vein with no stenosis. The catheter was advanced to a wedge portion of the a patent vein over which the 10 French sheath was advanced into the RIGHT hepatic vein. Using ICE ultrasound visualization the catheter as RIGHT hepatic vein as well as the portal anatomy was defined. A planned exit site from the hepatic vein and puncture site from the portal vein was placed into a single sonographic plane. Under direct ultrasound visualization, the Argon Scorpion X needle was advanced into the central RIGHT portal vein. Hand injection of contrast confirmed position within the portal system. A Glidewire was then advanced into the splenic vein. A 5 French marking pigtail catheter was then advanced over the wire into the main portal vein and wire removed. Portal venogram was performed which demonstrated a patent portal vein. RIGHT atrial and portal pressures were performed. The tract was then dilated with an 8 mm x 4 mm Mustang balloon. At this point, secondary to acute angulation with a standard RHV to RPV branch approach a TIPS stent placement would  prove challenging. Multiple attempts were made at cannulating a middle hepatic vein, which appears to have a better trajectory for TIPS placement, though this was unsuccessful. The decision was made to abort and reconvene at a later date. The paracentesis drain, catheters and sheath were removed and manual compression was applied to the right internal jugular and right common femoral venous access sites until hemostasis was achieved. The patient was transferred to the PACU in stable condition. Pre-TIPS Mean Pressures (mmHg): Right atrium: 20 Portal vein: 44 Portosystemic gradient: 24 FINDINGS: -access via the RIGHT jugular and femoral veins. - ICE-guided portal vein access, however acute angulation with a RHV to RPV branch approach. - Aborted TIPS secondary to challenging portal vein anatomy - Elevated portosystemic gradient of 24 mmHg, consistent with portal HTN (RA 20, PV 44) - Therapeutic paracentesis with 3.8L of serous ascites removed. IMPRESSION: 1. Portal venogram, central and portal venous manometry consistent with portal hypertension. Portosystemic gradient markedly elevated at 44 mmHg. 2. Aborted TIPS secondary to challenging anatomy, as detailed above. PLAN: Appreciate management by Cardiology, Nephrology and GI teams. We will plan on medical optimization, serial paracentesis for symptomatic relief, and return to Vascular Interventional Radiology (VIR) for interval TIPS placement at a later date. Roanna Banning, MD [facility]iology Specialists [facility] Radiology Electronically Signed   By: Roanna Banning M.D.   On: 02/25/2022 12:45   DG CHEST PORT 1 VIEW  Result Date: 03/03/2022 CLINICAL DATA:  Follow-up EXAM: PORTABLE CHEST 1 VIEW COMPARISON:  Chest x-ray dated February 24, 2022  FINDINGS: The heart size and mediastinal contours are within normal limits. Low lung volumes with bibasilar linear opacities. Mitral annular calcifications. No large pleural effusion or evidence of pneumothorax.  IMPRESSION: Low lung volumes with bibasilar linear opacities which are likely due to atelectasis. Electronically Signed   By: Allegra Lai M.D.   On: 03/03/2022 12:01   DG Chest Portable 1 View  Result Date: 02/20/2022 CLINICAL DATA:  DOE, abdominal ascites EXAM: PORTABLE CHEST 1 VIEW COMPARISON:  February 12, 2022 chest x-ray. FINDINGS: Low lung volumes. Linear bibasilar opacities, compatible with subsegmental atelectasis. No confluent consolidation. No visible pleural effusions or pneumothorax. Cardiomediastinal silhouette is similar. Polyarticular degenerative change. IMPRESSION: Low lung volumes with subsegmental bibasilar atelectasis. Electronically Signed   By: Feliberto Harts M.D.   On: 02/20/2022 14:42   US LIVER DOPPLER  Result Date: 02/22/2022 CLINICAL DATA:  Ascites.  Evaluate for portal vein thrombosis. EXAM: DUPLEX ULTRASOUND OF LIVER TECHNIQUE: Color and duplex Doppler ultrasound was performed to evaluate the hepatic in-flow and out-flow vessels. COMPARISON:  CT AP, 01/29/2022 and 01/04/2019. IR ultrasound, most recently 02/21/2022. FINDINGS: Liver: Increased hepatic echogenicity with coarse parenchymal echotexture. Macronodular hepatic contour. No focal lesion, mass or intrahepatic biliary ductal dilatation. Main Portal Vein size: 1.1 cm Portal Vein Velocities (normal < 40 cm/s PSV) Main Prox:  45 cm/sec Main Mid: 44 cm/sec Main Dist:  185 cm/sec Right: 285 cm/sec Left: 203 cm/sec Hepatic Vein Velocities Right:  28 cm/sec Middle:  27 cm/sec Left:  29 cm/sec IVC: Present and patent with normal respiratory phasicity. Hepatic Artery Velocity:  146 cm/sec Splenic Vein Velocity:  17 cm/sec Spleen: 13.1 x 13.4 x 5.6 cm with a total volume of 510 cm^3 (411 cm^3 is upper limit normal) Portal Vein Occlusion/Thrombus: Present. Partially-occluding portal vein thrombus at the PV/SMV confluence. See key image. Splenic Vein Occlusion/Thrombus: No Ascites: A small-to-moderate volume of intra-abdominal  ascites is present. Varices: None demonstrated IMPRESSION: 1. 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 2. Marked elevation of portal venous pressures with reversal of flow. 3. Partially-occluding portal vein thrombus, at the PV/SMV confluence. 4. No sonographic evidence of hepatoma. Continue surveillance with ultrasound every 6 months per AASLD recommendation. 5. Small-to-moderate volume of residual intra-abdominal ascites. Noting the patient had US paracentesis on 02/21/2022 with 6.6 L drawn. Roanna Banning, MD [facility] Radiology Electronically Signed   By: Roanna Banning M.D.   On: 02/22/2022 08:01   ECHOCARDIOGRAM COMPLETE BUBBLE STUDY  Result Date: 02/23/2022    ECHOCARDIOGRAM REPORT   Patient Name:   Diane Pruitt Date of Exam: 02/23/2022 Medical Rec #:  [id number]        Height:       62.0 in Accession #:    [id number]       Weight:       170.0 lb Date of Birth:  [birth year]       BSA:          1.784 m Patient Age:    70 years         BP:           95/49 mmHg Patient Gender: F                HR:           67 bpm. Exam Location:  [facility] Procedure: 2D Echo, Cardiac Doppler and Color Doppler STAT ECHO Indications:     Portal Hypertension--- ICD-9 572.3  History:  Patient has prior history of Echocardiogram examinations, most                  recent 01/26/2018. Stroke, Arrythmias:Atrial Fibrillation; Risk                  Factors:Hypertension.  Sonographer:     Cristela Blue Referring Phys:  [id number] Roanna Banning Diagnosing Phys: Julien Nordmann MD IMPRESSIONS  1. Left ventricular ejection fraction, by estimation, is 60 to 65%. The left ventricle has normal function. The left ventricle has no regional wall motion abnormalities. Left ventricular diastolic parameters are consistent with Grade II diastolic dysfunction (pseudonormalization).  2. Right ventricular systolic function is normal. The right ventricular size is normal. There is  moderately elevated pulmonary artery systolic pressure. The estimated right ventricular systolic pressure is 46.5 mmHg.  3. Left atrial size was severely dilated.  4. The mitral valve is normal in structure. Mild to moderate mitral valve regurgitation. No evidence of mitral stenosis. Moderate mitral annular calcification.  5. The aortic valve has an indeterminant number of cusps. Aortic valve regurgitation is not visualized. Aortic valve sclerosis is present, with no evidence of aortic valve stenosis.  6. The inferior vena cava is normal in size with greater than 50% respiratory variability, suggesting right atrial pressure of 3 mmHg. FINDINGS  Left Ventricle: Left ventricular ejection fraction, by estimation, is 60 to 65%. The left ventricle has normal function. The left ventricle has no regional wall motion abnormalities. The left ventricular internal cavity size was normal in size. There is  no left ventricular hypertrophy. Left ventricular diastolic parameters are consistent with Grade II diastolic dysfunction (pseudonormalization). Right Ventricle: The right ventricular size is normal. No increase in right ventricular wall thickness. Right ventricular systolic function is normal. There is moderately elevated pulmonary artery systolic pressure. The tricuspid regurgitant velocity is 3.22 m/s, and with an assumed right atrial pressure of 5 mmHg, the estimated right ventricular systolic pressure is 46.5 mmHg. Left Atrium: Left atrial size was severely dilated. Right Atrium: Right atrial size was normal in size. Pericardium: There is no evidence of pericardial effusion. Mitral Valve: The mitral valve is normal in structure. Moderate mitral annular calcification. Mild to moderate mitral valve regurgitation. No evidence of mitral valve stenosis. MV peak gradient, 7.6 mmHg. The mean mitral valve gradient is 3.0 mmHg. Tricuspid Valve: The tricuspid valve is normal in structure. Tricuspid valve regurgitation is mild . No  evidence of tricuspid stenosis. Aortic Valve: The aortic valve has an indeterminant number of cusps. Aortic valve regurgitation is not visualized. Aortic valve sclerosis is present, with no evidence of aortic valve stenosis. Aortic valve mean gradient measures 5.0 mmHg. Aortic valve peak gradient measures 8.7 mmHg. Aortic valve area, by VTI measures 3.12 cm. Pulmonic Valve: The pulmonic valve was normal in structure. Pulmonic valve regurgitation is not visualized. No evidence of pulmonic stenosis. Aorta: The aortic root is normal in size and structure. Venous: The inferior vena cava is normal in size with greater than 50% respiratory variability, suggesting right atrial pressure of 3 mmHg. IAS/Shunts: No atrial level shunt detected by color flow Doppler.  LEFT VENTRICLE PLAX 2D LVIDd:         4.80 cm   Diastology LVIDs:         2.80 cm   LV e' medial:    6.42 cm/s LV PW:         0.80 cm   LV E/e' medial:  19.3 LV IVS:  1.05 cm   LV e' lateral:   8.49 cm/s LVOT diam:     2.00 cm   LV E/e' lateral: 14.6 LV SV:         78 LV SV Index:   44 LVOT Area:     3.14 cm  RIGHT VENTRICLE RV Basal diam:  3.20 cm RV S prime:     16.80 cm/s TAPSE (M-mode): 2.8 cm LEFT ATRIUM              Index        RIGHT ATRIUM           Index LA diam:        5.70 cm  3.19 cm/m   RA Area:     12.20 cm LA Vol (A2C):   121.0 ml 67.82 ml/m  RA Volume:   22.80 ml  12.78 ml/m LA Vol (A4C):   151.0 ml 84.64 ml/m LA Biplane Vol: 142.0 ml 79.59 ml/m  AORTIC VALVE                    PULMONIC VALVE AV Area (Vmax):    2.51 cm     PV Vmax:          0.96 m/s AV Area (Vmean):   2.63 cm     PV Vmean:         71.200 cm/s AV Area (VTI):     3.12 cm     PV VTI:           0.211 m AV Vmax:           147.50 cm/s  PV Peak grad:     3.7 mmHg AV Vmean:          99.900 cm/s  PV Mean grad:     2.0 mmHg AV VTI:            0.250 m      PR End Diast Vel: 6.66 msec AV Peak Grad:      8.7 mmHg     RVOT Peak grad:   7 mmHg AV Mean Grad:      5.0 mmHg LVOT  Vmax:         118.00 cm/s LVOT Vmean:        83.600 cm/s LVOT VTI:          0.249 m LVOT/AV VTI ratio: 0.99  AORTA Ao Root diam: 3.13 cm MITRAL VALVE                TRICUSPID VALVE MV Area (PHT): 3.87 cm     TR Peak grad:   41.5 mmHg MV Area VTI:   2.03 cm     TR Vmax:        322.00 cm/s MV Peak grad:  7.6 mmHg MV Mean grad:  3.0 mmHg     SHUNTS MV Vmax:       1.38 m/s     Systemic VTI:  0.25 m MV Vmean:      75.6 cm/s    Systemic Diam: 2.00 cm MV Decel Time: 196 msec     Pulmonic VTI:  0.225 m MV E velocity: 124.00 cm/s MV A velocity: 53.60 cm/s MV E/A ratio:  2.31 Julien Nordmann MD Electronically signed by Julien Nordmann MD Signature Date/Time: 02/23/2022/12:54:30 PM    Final    IR Paracentesis  Result Date: 03/05/2022 INDICATION: Patient with a history of cirrhosis and recurrent ascites. Interventional radiology asked to perform a diagnostic and  therapeutic paracentesis. EXAM: ULTRASOUND GUIDED PARACENTESIS MEDICATIONS: 1% lidocaine 10 mL COMPLICATIONS: None immediate. PROCEDURE: Informed written consent was obtained from the patient after a discussion of the risks, benefits and alternatives to treatment. A timeout was performed prior to the initiation of the procedure. Initial ultrasound scanning demonstrates a large amount of ascites within the left lower abdominal quadrant. The left lower abdomen was prepped and draped in the usual sterile fashion. 1% lidocaine was used for local anesthesia. Following this, a 19 gauge, 7-cm, Yueh catheter was introduced. An ultrasound image was saved for documentation purposes. The paracentesis was performed. The catheter was removed and a dressing was applied. The patient tolerated the procedure well without immediate post procedural complication. Patient received post-procedure intravenous albumin; see nursing notes for details. FINDINGS: A total of approximately 5.3 L of clear yellow fluid was removed. Samples were sent to the laboratory as requested by the clinical  team. IMPRESSION: Successful ultrasound-guided paracentesis yielding 5.3 liters of peritoneal fluid. Read by: Alwyn Ren, NP PLAN: The patient has previously been formally evaluated by the [facility]pert[facility] and is being actively followed for potential future intervention. Electronically Signed   By: Judie Petit.  Shick M.D.   On: 03/05/2022 11:36   IR Paracentesis  Result Date: 02/25/2022 CLINICAL DATA:  Briefly, 71 year old female with decompensated EtOH cirrhosis and portal hypertension. Liver Doppler with PV/SMV confluence thrombus. MELD = 18.  Child Pugh class B, 8 points. EXAM: Procedures: 1. ABORTED TRANSJUGULAR INTRAHEPATIC PORTOSYSTEMIC SHUNT (TIPS) 2. CENTRAL AND PORTAL VENOUS MANOMETRY 3. PORTAL VENOGRAM 4. INTRACARDIAC ECHOCARDIOGRAPHY (ICE) 5. THERAPEUTIC PARACENTESIS Performing Physician: Roanna Banning, MD Assistant(s): Ruel Favors, MD A qualified trainee/resident or advanced practice provider (APP) was not immediately available to assist with this case. MEDICATIONS: As antibiotic prophylaxis, Mefoxin 2 g IV was ordered pre-procedure and administered intravenously within one hour of incision. ANESTHESIA/SEDATION: Sedation by the Anesthesia Team was performed. Please see anesthesiology log for details. CONTRAST:  100 mL Visipaque 320, intravenous FLUOROSCOPY TIME:  Fluoroscopic dose; 368 mGy COMPLICATIONS: None immediate. PROCEDURE: Informed written consent was obtained from the patient and/or patient's representative after a thorough discussion of the procedural risks, benefits and alternatives. All questions were addressed. Maximal sterile barrier technique was utilized including caps, mask, sterile gowns, sterile gloves, sterile drape, hand hygiene and skin antiseptic. A timeout was performed prior to the initiation of the procedure. Initial ultrasound scanning demonstrates a large amount of ascites within the RIGHT abdomen. Under direct ultrasound guidance,  an 8 Fr Safe-T-Centesis catheter was introduced. An ultrasound image was saved for documentation purposes. Therapeutic paracentesis was performed. A preliminary ultrasound of the RIGHT neck was performed and demonstrates a patent internal jugular vein. A permanent ultrasound image was recorded. Using a combination of fluoroscopy and ultrasound, an access site was determined. A small dermatotomy was made at the planned puncture site. Using ultrasound guidance, access into the RIGHT internal jugular vein was obtained with visualization of needle entry into the vessel using a standard micropuncture technique. A wire was advanced into the IVC and serial fascial dilation performed. A 10 French TIPS sheath was placed into the internal jugular vein and advanced to the IVC. A preliminary ultrasound of the RIGHT groin was performed and demonstrates a patent right common femoral vein. A permanent ultrasound image was recorded. Using a combination of fluoroscopy and ultrasound, an access site was determined. A small dermatotomy was made at the planned puncture site. Using ultrasound guidance, access into the RIGHT common femoral vein was obtained  with visualization of needle entry into the vessel using a standard micropuncture technique. A wire was advanced into the IVC insert all fascial dilation performed. An 8 [place], 11 cm vascular sheath was placed into the external iliac vein. Through this access site, an 19 [place] ICE catheter was advanced with ease under fluoroscopic guidance to the level of the intrahepatic inferior vena cava. The jugular sheath was retracted into the right atrium and manometry was performed. A 5 French angled tip catheter was then directed into the RIGHT hepatic vein. Hepatic venogram was performed. These images demonstrated patent hepatic vein with no stenosis. The catheter was advanced to a wedge portion of the a patent vein over which the 10 French sheath was advanced into the RIGHT hepatic  vein. Using ICE ultrasound visualization the catheter as RIGHT hepatic vein as well as the portal anatomy was defined. A planned exit site from the hepatic vein and puncture site from the portal vein was placed into a single sonographic plane. Under direct ultrasound visualization, the Argon Scorpion X needle was advanced into the central RIGHT portal vein. Hand injection of contrast confirmed position within the portal system. A Glidewire was then advanced into the splenic vein. A 5 French marking pigtail catheter was then advanced over the wire into the main portal vein and wire removed. Portal venogram was performed which demonstrated a patent portal vein. RIGHT atrial and portal pressures were performed. The tract was then dilated with an 8 mm x 4 mm Mustang balloon. At this point, secondary to acute angulation with a standard RHV to RPV branch approach a TIPS stent placement would prove challenging. Multiple attempts were made at cannulating a middle hepatic vein, which appears to have a better trajectory for TIPS placement, though this was unsuccessful. The decision was made to abort and reconvene at a later date. The paracentesis drain, catheters and sheath were removed and manual compression was applied to the right internal jugular and right common femoral venous access sites until hemostasis was achieved. The patient was transferred to the PACU in stable condition. Pre-TIPS Mean Pressures (mmHg): Right atrium: 20 Portal vein: 44 Portosystemic gradient: 24 FINDINGS: -access via the RIGHT jugular and femoral veins. - ICE-guided portal vein access, however acute angulation with a RHV to RPV branch approach. - Aborted TIPS secondary to challenging portal vein anatomy - Elevated portosystemic gradient of 24 mmHg, consistent with portal HTN (RA 20, PV 44) - Therapeutic paracentesis with 3.8L of serous ascites removed. IMPRESSION: 1. Portal venogram, central and portal venous manometry consistent with portal  hypertension. Portosystemic gradient markedly elevated at 44 mmHg. 2. Aborted TIPS secondary to challenging anatomy, as detailed above. PLAN: Appreciate management by Cardiology, Nephrology and GI teams. We will plan on medical optimization, serial paracentesis for symptomatic relief, and return to Vascular Interventional Radiology (VIR) for interval TIPS placement at a later date. Roanna Banning, MD [facility] Radiology Electronically Signed   By: Roanna Banning M.D.   On: 02/25/2022 12:45   IR Paracentesis  Result Date: 02/12/2022 INDICATION: Patient with history of cirrhosis, abdominal distention. Request made for therapeutic paracentesis. EXAM: ULTRASOUND GUIDED THERAPEUTIC PARACENTESIS MEDICATIONS: 10 mL 1% lidocaine COMPLICATIONS: None immediate. PROCEDURE: Informed written consent was obtained from the patient after a discussion of the risks, benefits and alternatives to treatment. A timeout was performed prior to the initiation of the procedure. Initial ultrasound scanning demonstrates a large amount of ascites within the right lower abdominal quadrant. The right lower abdomen  was prepped and draped in the usual sterile fashion. 1% lidocaine was used for local anesthesia. Following this, a 19 gauge, 7-cm, Yueh catheter was introduced. An ultrasound image was saved for documentation purposes. The paracentesis was performed. The catheter was removed and a dressing was applied. The patient tolerated the procedure well without immediate post procedural complication. FINDINGS: A total of approximately 6.3 liters of clear, yellow fluid was removed. Samples were sent to the laboratory as requested by the clinical team. IMPRESSION: Successful ultrasound-guided therapeutic paracentesis yielding 6.3 liters of peritoneal fluid. Read by: Loyce Dys PA-C Electronically Signed   By: Marliss Coots M.D.   On: 02/12/2022 16:01     Subjective: Patient seen examined  bedside, resting comfortably.  Sitting in bedside chair.  Eating breakfast.  No complaints this morning.  Denies abdominal pain.  Discharging to SNF under hospice today.  Further denies headache, no dizziness, no chest pain, no palpitations, no shortness of breath, no nausea/vomiting/diarrhea, no fever/chills/night sweats, no fatigue, no paresthesias.  No acute events overnight per nursing staff.  Discharge Exam: Vitals:   03/07/22 0500 03/07/22 0807  BP: (!) 94/58 (!) 88/56  Pulse: 98 96  Resp: 18 17  Temp: 98.1 F (36.7 C) (!) 97.5 F (36.4 C)  SpO2: 96% 97%   Vitals:   03/06/22 1841 03/06/22 2221 03/07/22 0500 03/07/22 0807  BP: (!) 87/52 (!) 89/53 (!) 94/58 (!) 88/56  Pulse: 99 99 98 96  Resp: 16 17 18 17   Temp: 97.9 F (36.6 C) 98.2 F (36.8 C) 98.1 F (36.7 C) (!) 97.5 F (36.4 C)  TempSrc: Oral Oral Oral Oral  SpO2: 95% 96% 96% 97%  Weight:      Height:        Physical Exam: GEN: NAD, alert and oriented x 3, chronically ill in appearance HEENT: NCAT, PERRL, EOMI, sclera clear, MMM PULM: CTAB w/o wheezes/crackles, normal respiratory effort, on room air CV: RRR w/o M/G/R GI: abd soft, nontender, mild abdominal distention, NABS, no R/G/M MSK: no peripheral edema, muscle strength globally intact 5/5 bilateral upper/lower extremities NEURO: CN II-XII intact, no focal deficits, sensation to light touch intact PSYCH: normal mood/affect Integumentary: dry/intact, no rashes or wounds    The results of significant diagnostics from this hospitalization (including imaging, microbiology, ancillary and laboratory) are listed below for reference.     Microbiology: Recent Results (from the past 240 hour(s))  Culture, body fluid w Gram Stain-bottle     Status: None (Preliminary result)   Collection Time: 03/05/22 10:29 AM   Specimen: Peritoneal Washings  Result Value Ref Range Status   Specimen Description PERITONEAL FLUID  Final   Special Requests ABDOMEN  Final   Culture    Final    NO GROWTH 2 DAYS Performed at [facility], [address], [place], [place] [address]    Report Status PENDING  Incomplete  Gram stain     Status: None   Collection Time: 03/05/22 10:29 AM   Specimen: Peritoneal Washings  Result Value Ref Range Status   Specimen Description PERITONEAL FLUID  Final   Special Requests ABDOMEN  Final   Gram Stain   Final    WBC PRESENT,BOTH PMN AND MONONUCLEAR NO ORGANISMS SEEN CYTOSPIN SMEAR Performed at [facility], [address], [place], [place] [address]    Report Status 03/05/2022 FINAL  Final     Labs: BNP (last 3 results) No results for input(s): BNP in the last 8760 hours. Basic Metabolic  Panel: Recent Labs  Lab 03/01/22 0104 03/02/22 0212 03/03/22 0245 03/05/22 0222 03/06/22 0232 03/07/22 0148  NA 131* 130* 132* 131* 131* 132*  K 3.9 3.7 3.5 3.8 3.2* 4.0  CL 105 102 105 104 102 103  CO2 16* 14* 17* 16* 18* 17*  GLUCOSE 107* 94 118* 106* 99 112*  BUN 50* 49* 54* 68* 75* 87*  CREATININE 2.27* 2.43* 2.57* 2.46* 2.75* 2.95*  CALCIUM 8.7* 8.6* 8.5* 8.3* 8.3* 8.3*  MG 1.9  --   --   --  1.8  --   PHOS 4.1  --   --   --  5.5*  --    Liver Function Tests: Recent Labs  Lab 03/01/22 0104 03/02/22 0212 03/03/22 0245 03/06/22 0232 03/07/22 0148  AST  --  152* 144* 138* 177*  ALT  --  59* 58* 52* 58*  ALKPHOS  --  112 107 112 138*  BILITOT  --  2.4* 2.0* 2.1* 1.5*  PROT  --  5.8* 5.5* 5.6* 5.8*  ALBUMIN 3.3* 3.1* 3.0* 2.8* 2.8*   No results for input(s): LIPASE, AMYLASE in the last 168 hours. Recent Labs  Lab 02/28/22 1917 03/03/22 0245  AMMONIA 46* 32   CBC: Recent Labs  Lab 03/01/22 0104 03/02/22 0212 03/03/22 0245 03/05/22 0222 03/06/22 0232 03/07/22 0148  WBC 16.8* 21.6* 24.1* 26.7* 24.5* 24.4*  NEUTROABS 14.4* 18.6* 21.0*  --  22.4* 22.0*  HGB 8.8* 8.8* 8.9* 9.4* 8.7* 9.3*  HCT 26.3* 25.9* 26.4* 28.6* 26.8* 27.4*  MCV 79.7* 78.0* 78.8* 79.4* 81.5 80.6  PLT 143* 149* 155 158 142*  160   Cardiac Enzymes: No results for input(s): CKTOTAL, CKMB, CKMBINDEX, TROPONINI in the last 168 hours. BNP: Invalid input(s): POCBNP CBG: No results for input(s): GLUCAP in the last 168 hours. D-Dimer No results for input(s): DDIMER in the last 72 hours. Hgb A1c No results for input(s): HGBA1C in the last 72 hours. Lipid Profile No results for input(s): CHOL, HDL, LDLCALC, TRIG, CHOLHDL, LDLDIRECT in the last 72 hours. Thyroid function studies No results for input(s): TSH, T4TOTAL, T3FREE, THYROIDAB in the last 72 hours.  Invalid input(s): FREET3 Anemia work up No results for input(s): VITAMINB12, FOLATE, FERRITIN, TIBC, IRON, RETICCTPCT in the last 72 hours. Urinalysis    Component Value Date/Time   COLORURINE YELLOW 02/27/2022 0459   APPEARANCEUR HAZY (A) 02/27/2022 0459   LABSPEC 1.023 02/27/2022 0459   PHURINE 5.0 02/27/2022 0459   GLUCOSEU NEGATIVE 02/27/2022 0459   HGBUR NEGATIVE 02/27/2022 0459   BILIRUBINUR NEGATIVE 02/27/2022 0459   KETONESUR NEGATIVE 02/27/2022 0459   PROTEINUR NEGATIVE 02/27/2022 0459   NITRITE NEGATIVE 02/27/2022 0459   LEUKOCYTESUR NEGATIVE 02/27/2022 0459   Sepsis Labs Invalid input(s): PROCALCITONIN,  WBC,  LACTICIDVEN Microbiology Recent Results (from the past 240 hour(s))  Culture, body fluid w Gram Stain-bottle     Status: None (Preliminary result)   Collection Time: 03/05/22 10:29 AM   Specimen: Peritoneal Washings  Result Value Ref Range Status   Specimen Description PERITONEAL FLUID  Final   Special Requests ABDOMEN  Final   Culture   Final    NO GROWTH 2 DAYS Performed at [facility], [address] [place], [place] [address]    Report Status PENDING  Incomplete  Gram stain     Status: None   Collection Time: 03/05/22 10:29 AM   Specimen: Peritoneal Washings  Result Value Ref Range Status   Specimen Description PERITONEAL FLUID  Final  Special Requests ABDOMEN  Final   Gram Stain   Final    WBC PRESENT,BOTH  PMN AND MONONUCLEAR NO ORGANISMS SEEN CYTOSPIN SMEAR Performed at [facility], [address], [place], [place] [address]    Report Status 03/05/2022 FINAL  Final     Time coordinating discharge: Over 30 minutes  SIGNED:   Alvira Philips [place], DO  [facility] 03/07/2022, 10:30 AM

## 2022-03-07 NOTE — TOC Transition Note (Signed)
Transition of Care (TOC) - CM/SW Discharge Note ? ? ?Patient Details  ?Name: Diane Pruitt ?MRN: [id number] ?Date of Birth: [birth year] ? ?Transition of Care (TOC) CM/SW Contact:  ?Coralee Pesa, LCSWA ?Phone Number: ?03/07/2022, 10:48 AM ? ? ?Clinical Narrative:    ? ?Pt to be transported to [facility] via [facility] at 2 PM.  ?Nurse to call report to [phone]. ? ?Final next level of care: [place] ?Barriers to Discharge: Barriers Resolved ? ? ?Patient Goals and CMS Choice ?Patient states their goals for this hospitalization and ongoing recovery are:: SNF VS hospice ?CMS Medicare.gov Compare Post Acute Care list provided to:: Patient ?Choice offered to / list presented to : Adult Children ? ?Discharge Placement ?  ?           ?Patient chooses bed at: [facility] ?Patient to be transferred to facility by: [facility] ?Name of family member notified: Arville Go ?Patient and family notified of of transfer: 03/07/22 ? ?Discharge Plan and Services ?  ?  ?Post Acute Care Choice: [place]          ?  ?  ?  ?  ?  ?  ?  ?  ?  ?  ? ?Social Determinants of Health (SDOH) Interventions ?  ? ? ?Readmission Risk Interventions ?   ? View : No data to display.  ?  ?  ?  ? ? ? ? ? ?

## 2022-03-08 ENCOUNTER — Non-Acute Institutional Stay (SKILLED_NURSING_FACILITY): Payer: Medicare HMO | Admitting: Internal Medicine

## 2022-03-08 ENCOUNTER — Encounter: Payer: Self-pay | Admitting: Internal Medicine

## 2022-03-08 ENCOUNTER — Other Ambulatory Visit: Payer: Self-pay | Admitting: Radiology

## 2022-03-08 DIAGNOSIS — E039 Hypothyroidism, unspecified: Secondary | ICD-10-CM

## 2022-03-08 DIAGNOSIS — K7031 Alcoholic cirrhosis of liver with ascites: Secondary | ICD-10-CM

## 2022-03-08 DIAGNOSIS — A498 Other bacterial infections of unspecified site: Secondary | ICD-10-CM | POA: Diagnosis not present

## 2022-03-08 DIAGNOSIS — E440 Moderate protein-calorie malnutrition: Secondary | ICD-10-CM

## 2022-03-08 DIAGNOSIS — N179 Acute kidney failure, unspecified: Secondary | ICD-10-CM | POA: Diagnosis not present

## 2022-03-08 DIAGNOSIS — R69 Illness, unspecified: Secondary | ICD-10-CM | POA: Diagnosis not present

## 2022-03-08 DIAGNOSIS — E46 Unspecified protein-calorie malnutrition: Secondary | ICD-10-CM | POA: Insufficient documentation

## 2022-03-08 NOTE — Assessment & Plan Note (Addendum)
4/28 - 03/07/2022 admission creatinine 2.35/GFR 22 with predischarge creatinine 2.95 and GFR 17, both CKD stage IV. ?It is critical to avoid nephrotoxic drugs. ?She is not a HD candidate as she is now on palliative care. ?

## 2022-03-08 NOTE — Assessment & Plan Note (Addendum)
IR follow-up appointment 03/03/2022 for therapeutic paracentesis as comfort measure. ?

## 2022-03-08 NOTE — Patient Instructions (Signed)
See assessment and plan under each diagnosis in the problem list and acutely for this visit 

## 2022-03-08 NOTE — Progress Notes (Signed)
? ?NURSING HOME LOCATION:  [place] ?ROOM NUMBER:  102 A ? ?CODE STATUS:  DNR ? ?PCP:  [name] MD ? ?This is a comprehensive admission note to this SNFperformed on this date less than 30 days from date of admission. ?Included are preadmission medical/surgical history; reconciled medication list; family history; social history and comprehensive review of systems.  ?Corrections and additions to the records were documented. Comprehensive physical exam was also performed. Additionally a clinical summary was entered for each active diagnosis pertinent to this admission in the Problem List to enhance continuity of care. ? ?HPI: She was hospitalized 4/28 - 03/07/2022 after initial admission to [facility] 4/25 with acute on chronic renal failure consistent with hepatorenal syndrome in the context of C. difficile positivity.   ?She was transferred for portal hypertensive gastropathy with recurrent ascites for consideration of IR TIPS procedure.  Unfortunately difficult anatomy precluded this procedure being completed.  She underwent paracentesis on 4/25, 4/29, and 5/8.  Ascites fluid revealed no growth despite leukocytosis.  Because of leukocytosis she had been started on empiric ceftriaxone for possible underlying SBP. ?History electrolyte abnormalities include hyponatremia and hypomagnesemia.  She also had anemia of chronic disease.  Anemia was relatively stable throughout the hospitalization with final H/H of 9.3/27.4.  At admission values had been 9.1/28.5. ?Transaminitis was present with admission AST of 172 and ALT of 71.  At discharge values were 177 and 58 respectively.  Protein caloric malnutrition was present with an albumin of 2.8 and total protein 5.8.   ?Her CKD remained basically stable.  At admission creatinine was 2.35 and GFR 22 and at discharge 2.95 and GFR 17, both CKD stage IV.  Final sodium was 132. ?She is on amiodarone for PAF as well as high-dose L-thyroxine.  The most recent  TSH on record was 13.173 on 4/17.  Serially the TSH was dropping. ?This was associated with acute metabolic encephalopathy; ammonia level was 46 for which lactulose was administered.  Goal was 2-3 soft bowel movements per day. ?Palliative care consulted and patient elected to return to an SNF facility under Hospice care. ?Ciprofloxacin 500 mg daily for possible SBP was initiated.  Her home maintenance spironolactone and furosemide had been discontinued due to worsening renal function.  Nephrology consulted; albumin infusions were administered while  having the paracenteses. ?She completed a 10-day course of oral vancomycin as an inpatient but this was to be extended while on ciprofloxacin.Vancomycin 125 mg p.o. 4 times daily until completion of Cipro. ?Weekly paracenteses were to be continued for comfort measures.The next scheduled is 5/15 in IR. ?Patient follow-up with Dr. Therisa Doyne, GI 2 weeks postdischarge. ? ?Past medical and surgical history: Includes paroxysmal atrial fibrillation, hypothyroidism, GERD with Barrett's esophagus, history of cirrhosis, essential hypertension, history of ischemic stroke, and CKD. ?Surgeries and procedures include EGD, kyphoplasty, and bilateral lens implants. ? ?Social history: Alcohol related cirrhosis.  Never smoked. ? ?Family history: Reviewed; both parents had A-fib ?  ?Review of systems: She states that at this time she is "all right".  She is cognizant that she has cirrhosis and realized that she has an appointment with IR on 5/15 for paracentesis.  She describes "diarrhea" as up to 8 loose stools a day.  She has noted some drainage from the site of the prior paracentesis.  She denies any other symptoms. ? ?Constitutional: No fever, significant weight change ?ENT/mouth: No nasal congestion, purulent discharge, earache, change in hearing, sore throat  ?Cardiovascular: No chest pain, palpitations, paroxysmal  nocturnal dyspnea, edema  ?Respiratory: No cough, sputum production,  hemoptysis, DOE, significant snoring, apnea  ?Gastrointestinal: No heartburn, dysphagia, abdominal pain, nausea /vomiting, rectal bleeding, melena ?Genitourinary: No dysuria, hematuria, pyuria, incontinence, nocturia ?Musculoskeletal: No joint stiffness, joint swelling, pain ?Dermatologic: No rash, pruritus, change in appearance of skin ?Neurologic: No dizziness, headache, syncope, seizures, numbness, tingling ?Psychiatric: No significant anxiety, depression, insomnia, anorexia ?Endocrine: No change in hair/skin/nails, excessive thirst, excessive hunger, excessive urination  ?Hematologic/lymphatic: No significant bruising, lymphadenopathy, abnormal bleeding ? ?Physical exam:  ?Pertinent or positive findings: She appears chronically ill.  She lies in bed and will communicate but there is minimal spontaneous muscular activity.  She has bilateral ptosis.  She speaks in a very low voice,almost a whisper.  Dental hygiene is immaculate with exquisitely white teeth.  Heart rhythm is rapid and irregular.  She has low-grade rales anteriorly.  Abdomen is protuberant with decreased bowel sounds.  There is dullness in the right mid to upper quadrant to percussion.  Dorsalis pedis pulses are stronger than posterior tibial pulses.  She has interosseous wasting of the hands.  There is extensive bruising over the dorsum of the hands.  The right elbow is dressed. ? ?General appearance: no acute distress, increased work of breathing is present.   ?Lymphatic: No lymphadenopathy about the head, neck, axilla. ?Eyes: No conjunctival inflammation or lid edema is present. There is no scleral icterus. ?Ears:  External ear exam shows no significant lesions or deformities.   ?Nose:  External nasal examination shows no deformity or inflammation. Nasal mucosa are pink and moist without lesions, exudates ?Oral exam: Lips and gums are healthy appearing.There is no oropharyngeal erythema or exudate. ?Neck:  No thyromegaly, masses, tenderness  noted.    ?Heart:  No murmur, click, rub.  ?Lungs: without wheezes, rhonchi, rubs. ?Abdomen:  Abdomen is soft and nontender with no  hernias, masses. ?GU: Deferred  ?Extremities:  No cyanosis, clubbing, edema. ?Neurologic exam:  Balance, Rhomberg, finger to nose testing could not be completed due to clinical state ?Skin: Warm & dry w/o tenting. ?No significant lesions or rash. ? ?See clinical summary under each active problem in the Problem List with associated updated therapeutic plan ? ?

## 2022-03-08 NOTE — Assessment & Plan Note (Addendum)
Current TSH 13.173 on 02/12/2022.  Serially TSH is dropping.  This is in the context of amiodarone for PAF. ?TSH monitor indicated every 8-10 weeks while on amiodarone because of significant elevation. ?

## 2022-03-08 NOTE — Assessment & Plan Note (Addendum)
Current albumin 2.8 and total protein 5.8.  SNF Nutritionist asked to consult. ?

## 2022-03-08 NOTE — Assessment & Plan Note (Signed)
Oral vancomycin 4 times daily will be continued while on the Cipro. ?

## 2022-03-09 ENCOUNTER — Encounter (HOSPITAL_COMMUNITY): Payer: Self-pay | Admitting: Interventional Radiology

## 2022-03-09 ENCOUNTER — Other Ambulatory Visit (HOSPITAL_COMMUNITY): Payer: Self-pay | Admitting: Physician Assistant

## 2022-03-09 ENCOUNTER — Other Ambulatory Visit: Payer: Self-pay

## 2022-03-09 NOTE — Anesthesia Preprocedure Evaluation (Addendum)
Anesthesia Evaluation  ?Patient identified by MRN, date of birth, ID band ?Patient awake ? ? ? ?Reviewed: ?Allergy & Precautions, NPO status , Patient's Chart, lab work & pertinent test results ? ?Airway ?Mallampati: III ? ?TM Distance: >3 FB ?Neck ROM: Full ? ?Mouth opening: Limited Mouth Opening ? Dental ?no notable dental hx. ? ?  ?Pulmonary ?neg pulmonary ROS,  ?  ?Pulmonary exam normal ?breath sounds clear to auscultation ? ? ? ? ? ? Cardiovascular ?hypertension, Pt. on medications ?Normal cardiovascular exam+ dysrhythmias (paroxysmal afib)  ?Rhythm:Regular Rate:Normal ? ?ECHO 02/23/22: ?Left ventricular ejection fraction, by estimation, is 60 to 65%. The left ventricle has ?normal function. The left ventricle has no regional wall motion abnormalities. Left ?ventricular diastolic parameters are consistent with Grade II diastolic dysfunction ?(pseudonormalization). ?1. ?Right ventricular systolic function is normal. The right ventricular size is normal. ?There is moderately elevated pulmonary artery systolic pressure. The estimated right ?ventricular systolic pressure is 83.7 mmHg. ?2. ?3. Left atrial size was severely dilated. ?The mitral valve is normal in structure. Mild to moderate mitral valve regurgitation. No ?evidence of mitral stenosis. Moderate mitral annular calcification. ?4. ?The aortic valve has an indeterminant number of cusps. Aortic valve regurgitation is ?not visualized. Aortic valve sclerosis is present, with no evidence of aortic valve ?stenosis. ?5. ?The inferior vena cava is normal in size with greater than 50% respiratory variability, ?suggesting right atrial pressure of 3 mmHg. ? ?EKG 02/20/22: Sinus rhythm ?Atrial premature complex ?Nonspecific intraventricular conduction delay ?Anteroseptal infarct, old ?  ?Neuro/Psych ?PSYCHIATRIC DISORDERS Anxiety Depression weakness ?CVA   ? GI/Hepatic ?GERD  ,(+)  ?  ? substance abuse ? alcohol use, Alcoholic  cirrhosis ?Hepatorenal syndrome ?  ?Endo/Other  ?Hypothyroidism  ? Renal/GU ?CRF and Renal InsufficiencyRenal disease  ?negative genitourinary ?  ?Musculoskeletal ? ?(+) Arthritis ,  ? Abdominal ?  ?Peds ?negative pediatric ROS ?(+)  Hematology ? ?(+) Blood dyscrasia, anemia ,   ?Anesthesia Other Findings ? ? Reproductive/Obstetrics ? ?  ? ? ? ? ? ? ? ? ? ? ? ? ? ?  ?  ? ? ? ? ? ? ?Anesthesia Physical ?Anesthesia Plan ? ?ASA: 4 ? ?Anesthesia Plan: General  ? ?Post-op Pain Management:   ? ?Induction: Intravenous, Rapid sequence and Cricoid pressure planned ? ?PONV Risk Score and Plan: 3 and Treatment may vary due to age or medical condition ? ?Airway Management Planned: Oral ETT and Video Laryngoscope Planned ? ?Additional Equipment: Arterial line ? ?Intra-op Plan:  ? ?Post-operative Plan: Extubation in OR ? ?Informed Consent: I have reviewed the patients History and Physical, chart, labs and discussed the procedure including the risks, benefits and alternatives for the proposed anesthesia with the patient or authorized representative who has indicated his/her understanding and acceptance.  ? ? ? ?Dental advisory given ? ?Plan Discussed with: CRNA, Anesthesiologist and Surgeon ? ?Anesthesia Plan Comments: (GETA/RSI.  Glidescope for intubation. Patient borderline hypotensive in preop. Will place an arterial line. Will treat with albumin perioperatively. Patient does not have any DNR paperwork with her. Discussed with preop RN. Will plan to treat as full code given no documentation. Norton Blizzard, MD  ?)  ? ? ? ? ?Anesthesia Quick Evaluation ? ?

## 2022-03-09 NOTE — Progress Notes (Signed)
Anesthesia Chart Review: SAME DAY WORK-UP ? Case: [id number] Date/Time: 02/27/2022 1130  ? Procedure: TIPS  ? Anesthesia type: General  ? Pre-op diagnosis: CIRRHOSIS OF LIVER  ? Location: [facility] OR RADIOLOGY ROOM / [facility]  ? Surgeons: Michaelle Birks, MD  ? ?  ? ? ?DISCUSSION: Patient is a 71 year old female scheduled for the above procedure.  She had an aborted TIPS procedure on 02/24/2022 due to challenging portal vein anatomy.  ? ?History includes never, HTN, alcoholic liver cirrhosis (history of 6 vodkas/day), CVA (right PCA infarct 01/25/18), PAF (01/2018), CKD (stage III), anemia, L2 kyphoplasty (02/18/20). ? ?02/23/22 echo done during [facility] showed: LVEF 60-65%, no LV RWMA, mild to moderate MR, moderate elevated PASP, RVSP 46.5 mmHG, severely dilated LA on 02/23/22 echo. ? ?Summary of recent admissions March-May 2023: ?[place] admission 02/20/22-02/23/22 ([facility])-->02/23/22-03/07/22 (transferred to [facility] for TIPS) for AKI with hepatorenal syndrome, C. Difficile positive diarrhea. Required paracentiesis. Port vein thrombosis noted on imaging and discussed with IR with plan to transfer to [facility] for TIPS procedure. TIPS was aborted on 02/24/22 due to challenging portal vein anatomy with plans to re-attempt in the near future.  Nephrology consulted during hospitalization and felt acute on chronic kidney disease was secondary to hepatorenal syndrome.  She received 10-day course of oral vancomycin during hospitalization for C. difficile colitis and continued while on Cipro.  + Enterococcus Faecalis UTI 02/22/22. She received lactulose for mild hepatic encephalopathy.  Not on anticoagulation for PAF given recurrent anemia. ? ?- [place] admission 01/29/22-02/01/22 for orthostatic hypotension with deconditioning and weakness with fall.  Treated with IVF and albumin.  Midodrine increased.  Seen by PT/OT.  SNF placement. ? ?- [place] admission 01/21/22-01/26/22 for anemia and acute on chronic kidney disease.  Hemoglobin 7.7.  Received a  total of 2 units PRBC during hospitalization.  Eliquis held for at least 10 days.  Follow-up with GI for cirrhosis and portal gastropathy. ? ?- [place] admission 01/03/22-01/06/22 for symptomatic anemia. S/p 1 unit PRBC for HGB 7.4. EGD 01/05/22 "portal hypertensive gastropathy; 3 duodenal polyps which were resected and retrieved." ? ? ?She is a resident at [facility]. Last paracentesis 03/05/22.  Seen by Dr. Unice Cobble there on 03/08/2021.  He noted elevated TSH, dropping, and in the context of amiodarone for PAF.  He will continue to monitor TSH. Given AKI/CKD, avoiding nephrotoxic drugs but notes she is not a hemodialysis candidate currently as she is on palliative care.  Had pending IR procedure 03/27/2022. ? ? ?VS:  ?BP Readings from Last 3 Encounters:  ?03/08/22 118/67  ?03/07/22 (!) 96/56  ?02/23/22 (!) 101/48  ? ?Pulse Readings from Last 3 Encounters:  ?03/08/22 86  ?03/07/22 (!) 105  ?02/23/22 67  ? ? ?PROVIDERS: ?[name], MD is PCP  ?Ronnette Juniper, MD is GI ?Charolette Forward, MD is cardiologist ?Munsoor, Holley Raring, MD is nephrologist who was consulted during April 2023 admission for AKI with hyponatremia in setting of CKD stage III and cirrhosis. ? ? ?LABS: Labs as of 03/07/22 include: ?Lab Results  ?Component Value Date  ? WBC 24.4 (H) 03/07/2022  ? HGB 9.3 (L) 03/07/2022  ? HCT 27.4 (L) 03/07/2022  ? PLT 160 03/07/2022  ? GLUCOSE 112 (H) 03/07/2022  ? ALT 58 (H) 03/07/2022  ? AST 177 (H) 03/07/2022  ? NA 132 (L) 03/07/2022  ? K 4.0 03/07/2022  ? CL 103 03/07/2022  ? CREATININE 2.95 (H) 03/07/2022  ? BUN 87 (H) 03/07/2022  ? CO2  17 (L) 03/07/2022  ? TSH 13.173 (H) 02/12/2022  ? INR 1.5 (H) 02/24/2022  ? HGBA1C 4.5 (L) 01/22/2022  ?Cr 2.95, previously 2.27-2.75 since 02/26/22. AST/ALT results consistent with proir.  ? ? ?IMAGES: ?1V PCXR 03/03/22: ?FINDINGS: ?The heart size and mediastinal contours are within normal limits. ?Low lung volumes with bibasilar linear opacities. Mitral  annular ?calcifications. No large pleural effusion or evidence of ?pneumothorax. ?IMPRESSION: ?Low lung volumes with bibasilar linear opacities which are likely ?due to atelectasis. ?  ? ?US Liver Doppler 02/21/22: ?IMPRESSION: ?1. 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 ?2. Marked elevation of portal venous pressures with reversal of ?flow. ?3. 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 ?4. No sonographic evidence of hepatoma. ?Continue surveillance with ultrasound every 6 months per AASLD ?recommendation. ?5. Small-to-moderate volume of residual intra-abdominal ascites. ?Noting the patient had US paracentesis on 02/21/2022 with 6.6 L ?drawn. ? ? ?EKG: 02/20/22: ?Sinus rhythm ?Atrial premature complex ?Nonspecific intraventricular conduction delay ?Anteroseptal infarct, old ? ? ?CV: ?Echo 02/23/22: ?IMPRESSIONS  ? 1. Left ventricular ejection fraction, by estimation, is 60 to 65%. The  ?left ventricle has normal function. The left ventricle has no regional  ?wall motion abnormalities. Left ventricular diastolic parameters are  ?consistent with Grade II diastolic  ?dysfunction (pseudonormalization).  ? 2. Right ventricular systolic function is normal. The right ventricular  ?size is normal. There is moderately elevated pulmonary artery systolic  ?pressure. The estimated right ventricular systolic pressure is 73.4 mmHg.  ? 3. Left atrial size was severely dilated.  ? 4. The mitral valve is normal in structure. Mild to moderate mitral valve  ?regurgitation. No evidence of mitral stenosis. Moderate mitral annular  ?calcification.  ? 5. The aortic valve has an indeterminant number of cusps. Aortic valve  ?regurgitation is not visualized. Aortic valve sclerosis is present, with  ?no evidence of aortic valve stenosis.  ? 6. The inferior vena cava is normal in size with greater than 50%  ?respiratory variability, suggesting right atrial pressure of 3 mmHg.  ? 7. IAS/Shunts:  No atrial level shunt detected by color flow Doppler. ? ?Nuclear stress test 10/27/18: ?IMPRESSION: ?1. Probable mid anterior and apical infarcts. No reversibility to ?suggest ischemia. ?2. No focal wall motion abnormalities. ?3. Left ventricular ejection fraction 48% ?4. Non invasive risk stratification*: Intermediate ?- (EF 60-65% 01/26/18 echo) ? ?US Carotid 01/26/18: ?Final Interpretation:  ?- Right Carotid: Velocities in the right ICA are consistent with a 1-39%  ?stenosis.  ?- Left Carotid: Velocities in the left ICA are consistent with a 1-39%  ?stenosis.  ?- Vertebrals:  Bilateral vertebral arteries demonstrate antegrade flow.  ?- Subclavians: Normal flow hemodynamics were seen in bilateral subclavian  ?             arteries.  ? ? ?Past Medical History:  ?Diagnosis Date  ? Anemia   ? Anxiety   ? Atrial fibrillation ([place])   ? Barrett esophagus   ? Chronic kidney disease (CKD), stage III (moderate) (HCC)   ? Cirrhosis ([place])   ? Depression   ? Dysrhythmia   ? Atrial Fibrillation 2 years ago  ? Hypertension   ? Ischemic stroke ([place]) 01/25/2018  ? ? ?Past Surgical History:  ?Procedure Laterality Date  ? CESAREAN SECTION    ? ESOPHAGOGASTRODUODENOSCOPY (EGD) WITH PROPOFOL N/A 01/05/2022  ? Procedure: ESOPHAGOGASTRODUODENOSCOPY (EGD) WITH PROPOFOL;  Surgeon: Ronnette Juniper, MD;  Location: [facility];  Service: Gastroenterology;  Laterality: N/A;  ? EYE SURGERY    ?  INTRAOCULAR LENS INSERTION Bilateral About 3 years ago  ? IR PARACENTESIS  02/12/2022  ? IR PARACENTESIS  02/24/2022  ? IR PARACENTESIS  03/05/2022  ? IR TIPS  02/24/2022  ? IR US GUIDE VASC ACCESS RIGHT  02/24/2022  ? KYPHOPLASTY N/A 02/18/2020  ? Procedure: KYPHOPLASTY L2;  Surgeon: Melina Schools, MD;  Location: [facility];  Service: Orthopedics;  Laterality: N/A;  60 mins  ? RADIOLOGY WITH ANESTHESIA N/A 02/24/2022  ? Procedure: TIPS;  Surgeon: Suzette Battiest, MD;  Location: [facility];  Service: Radiology;  Laterality: N/A;  ? TONSILLECTOMY    ? TUBAL LIGATION     ? ? ?MEDICATIONS: ?No current facility-administered medications for this encounter.  ? ? acetaminophen (TYLENOL) 500 MG tablet  ? alendronate (FOSAMAX) 70 MG tablet  ? allopurinol (ZYLOPRIM) 100 MG tablet  ? amiodarone (PACER

## 2022-03-09 NOTE — Progress Notes (Signed)
Patient was admitted at [facility] on 03/07/2022. The facility was called with instructions for surgery day and the informations were faxed to nurse Oris Drone [phone]). Patient's daughter Diane Pruitt, [name]) was called for patient's medical history and surgical history. Per patient's daughter, [name] wasn't in contact with anybody tested positive for COVID or with symptoms of COVID.  ?

## 2022-03-09 NOTE — Progress Notes (Signed)
Diane Pruitt ?DOB: [birth year] ? ?-------------  SDW INSTRUCTIONS given: ? ?Your procedure is scheduled on Monday, May 15th, 2023. ? ? Report to [facility] Main Entrance "A" at 09:15 A.M., and check in at the Admitting office. ? Call this number if you have problems the morning of surgery: ? [phone] ? ? Remember: ? Do not eat or drink after midnight the night before your surgery ?  ? Take these medicines the morning of surgery with A SIP OF WATER Allopurinol, Tylenol, Amiodarone, Ciprofloxacin, Synthroid, Vancomycin, Midodrine.  ? ?As of today, STOP taking any Aspirin (unless otherwise instructed by your surgeon) Aleve, Naproxen, Ibuprofen, Motrin, Advil, Goody's, BC's, all herbal medications, fish oil, and all vitamins. ? ? The day of surgery: ?         ?           Do not wear jewelry, make up, or nail polish ?           Do not wear lotions, powders, perfumes, or deodorant. ?           Do not shave 48 hours prior to surgery.   ?           Do not bring valuables to the hospital. ?           [facility] is not responsible for any belongings or valuables. ? ?Do NOT Smoke (Tobacco/Vaping) 24 hours prior to your procedure ?If you use a CPAP at night, you may bring all equipment for your overnight stay. ?  ?Contacts, glasses, dentures or bridgework may not be worn into surgery.    ?  ?For patients admitted to the hospital, discharge time will be determined by your treatment team. ?  ?Patients discharged the day of surgery will not be allowed to drive home, and someone needs to stay with them for 24 hours. ? ?Special instructions:   ?[facility]- Preparing For Surgery ? ?Before surgery, you can play an important role. Because skin is not sterile, your skin needs to be as free of germs as possible. You can reduce the number of germs on your skin by washing with CHG (chlorahexidine gluconate) Soap before surgery.  CHG is an antiseptic cleaner which kills germs and bonds with the skin to continue killing germs even  after washing.   ? ?Oral Hygiene is also important to reduce your risk of infection.  Remember - BRUSH YOUR TEETH THE MORNING OF SURGERY WITH YOUR REGULAR TOOTHPASTE ? ?Please do not use if you have an allergy to CHG or antibacterial soaps. If your skin becomes reddened/irritated stop using the CHG.  ?Do not shave (including legs and underarms) for at least 48 hours prior to first CHG shower. It is OK to shave your face. ? ?Please follow these instructions carefully. ?  ?Shower the NIGHT BEFORE SURGERY and the MORNING OF SURGERY with DIAL Soap.  ? ?Pat yourself dry with a CLEAN TOWEL. ? ?Wear CLEAN PAJAMAS to bed the night before surgery ? ?Place CLEAN SHEETS on your bed the night of your first shower and DO NOT SLEEP WITH PETS. ? ? ?Day of Surgery: ?Please shower morning of surgery  ?Wear Clean/Comfortable clothing the morning of surgery ?Do not apply any deodorants/lotions.   ?Remember to brush your teeth WITH YOUR REGULAR TOOTHPASTE. ?  ?Questions were answered. Patient verbalized understanding of instructions.  ? ? ?    ?

## 2022-03-10 LAB — CULTURE, BODY FLUID W GRAM STAIN -BOTTLE: Culture: NO GROWTH

## 2022-03-12 ENCOUNTER — Encounter (HOSPITAL_COMMUNITY): Payer: Self-pay

## 2022-03-12 ENCOUNTER — Inpatient Hospital Stay (HOSPITAL_COMMUNITY): Admit: 2022-03-12 | Payer: Medicare HMO

## 2022-03-12 ENCOUNTER — Inpatient Hospital Stay (HOSPITAL_COMMUNITY): Payer: Medicare HMO | Admitting: Vascular Surgery

## 2022-03-12 ENCOUNTER — Inpatient Hospital Stay (HOSPITAL_COMMUNITY)
Admission: RE | Admit: 2022-03-12 | Discharge: 2022-03-29 | DRG: 405 | Disposition: E | Payer: Medicare HMO | Source: Skilled Nursing Facility | Attending: Internal Medicine | Admitting: Internal Medicine

## 2022-03-12 ENCOUNTER — Other Ambulatory Visit: Payer: Self-pay

## 2022-03-12 ENCOUNTER — Ambulatory Visit (HOSPITAL_COMMUNITY): Payer: Medicare HMO

## 2022-03-12 ENCOUNTER — Encounter (HOSPITAL_COMMUNITY): Payer: Self-pay | Admitting: Interventional Radiology

## 2022-03-12 ENCOUNTER — Encounter (HOSPITAL_COMMUNITY): Admission: RE | Disposition: E | Payer: Self-pay | Source: Skilled Nursing Facility | Attending: Internal Medicine

## 2022-03-12 DIAGNOSIS — A498 Other bacterial infections of unspecified site: Secondary | ICD-10-CM | POA: Diagnosis not present

## 2022-03-12 DIAGNOSIS — Z515 Encounter for palliative care: Secondary | ICD-10-CM

## 2022-03-12 DIAGNOSIS — L8990 Pressure ulcer of unspecified site, unspecified stage: Secondary | ICD-10-CM | POA: Insufficient documentation

## 2022-03-12 DIAGNOSIS — E039 Hypothyroidism, unspecified: Secondary | ICD-10-CM | POA: Diagnosis present

## 2022-03-12 DIAGNOSIS — Z7189 Other specified counseling: Secondary | ICD-10-CM | POA: Diagnosis not present

## 2022-03-12 DIAGNOSIS — R69 Illness, unspecified: Secondary | ICD-10-CM | POA: Diagnosis not present

## 2022-03-12 DIAGNOSIS — K7031 Alcoholic cirrhosis of liver with ascites: Secondary | ICD-10-CM | POA: Diagnosis not present

## 2022-03-12 DIAGNOSIS — Z8619 Personal history of other infectious and parasitic diseases: Secondary | ICD-10-CM

## 2022-03-12 DIAGNOSIS — I517 Cardiomegaly: Secondary | ICD-10-CM | POA: Diagnosis not present

## 2022-03-12 DIAGNOSIS — E871 Hypo-osmolality and hyponatremia: Secondary | ICD-10-CM | POA: Diagnosis not present

## 2022-03-12 DIAGNOSIS — Z8673 Personal history of transient ischemic attack (TIA), and cerebral infarction without residual deficits: Secondary | ICD-10-CM

## 2022-03-12 DIAGNOSIS — N189 Chronic kidney disease, unspecified: Secondary | ICD-10-CM | POA: Diagnosis not present

## 2022-03-12 DIAGNOSIS — I951 Orthostatic hypotension: Secondary | ICD-10-CM | POA: Diagnosis not present

## 2022-03-12 DIAGNOSIS — R578 Other shock: Secondary | ICD-10-CM | POA: Diagnosis not present

## 2022-03-12 DIAGNOSIS — I272 Pulmonary hypertension, unspecified: Secondary | ICD-10-CM | POA: Diagnosis not present

## 2022-03-12 DIAGNOSIS — Z8249 Family history of ischemic heart disease and other diseases of the circulatory system: Secondary | ICD-10-CM

## 2022-03-12 DIAGNOSIS — J9811 Atelectasis: Secondary | ICD-10-CM | POA: Diagnosis not present

## 2022-03-12 DIAGNOSIS — K3189 Other diseases of stomach and duodenum: Secondary | ICD-10-CM | POA: Diagnosis present

## 2022-03-12 DIAGNOSIS — I480 Paroxysmal atrial fibrillation: Secondary | ICD-10-CM | POA: Diagnosis present

## 2022-03-12 DIAGNOSIS — K219 Gastro-esophageal reflux disease without esophagitis: Secondary | ICD-10-CM | POA: Diagnosis not present

## 2022-03-12 DIAGNOSIS — Z6831 Body mass index (BMI) 31.0-31.9, adult: Secondary | ICD-10-CM | POA: Diagnosis not present

## 2022-03-12 DIAGNOSIS — Z7983 Long term (current) use of bisphosphonates: Secondary | ICD-10-CM

## 2022-03-12 DIAGNOSIS — R579 Shock, unspecified: Secondary | ICD-10-CM

## 2022-03-12 DIAGNOSIS — J984 Other disorders of lung: Secondary | ICD-10-CM | POA: Diagnosis not present

## 2022-03-12 DIAGNOSIS — L89151 Pressure ulcer of sacral region, stage 1: Secondary | ICD-10-CM | POA: Diagnosis present

## 2022-03-12 DIAGNOSIS — E440 Moderate protein-calorie malnutrition: Secondary | ICD-10-CM | POA: Diagnosis not present

## 2022-03-12 DIAGNOSIS — R188 Other ascites: Secondary | ICD-10-CM | POA: Diagnosis not present

## 2022-03-12 DIAGNOSIS — Z83438 Family history of other disorder of lipoprotein metabolism and other lipidemia: Secondary | ICD-10-CM

## 2022-03-12 DIAGNOSIS — N179 Acute kidney failure, unspecified: Secondary | ICD-10-CM | POA: Diagnosis present

## 2022-03-12 DIAGNOSIS — N1831 Chronic kidney disease, stage 3a: Secondary | ICD-10-CM | POA: Diagnosis not present

## 2022-03-12 DIAGNOSIS — D72829 Elevated white blood cell count, unspecified: Secondary | ICD-10-CM | POA: Diagnosis not present

## 2022-03-12 DIAGNOSIS — I129 Hypertensive chronic kidney disease with stage 1 through stage 4 chronic kidney disease, or unspecified chronic kidney disease: Secondary | ICD-10-CM | POA: Diagnosis not present

## 2022-03-12 DIAGNOSIS — K767 Hepatorenal syndrome: Secondary | ICD-10-CM | POA: Diagnosis not present

## 2022-03-12 DIAGNOSIS — K7460 Unspecified cirrhosis of liver: Secondary | ICD-10-CM | POA: Diagnosis present

## 2022-03-12 DIAGNOSIS — D631 Anemia in chronic kidney disease: Secondary | ICD-10-CM | POA: Diagnosis not present

## 2022-03-12 DIAGNOSIS — K766 Portal hypertension: Secondary | ICD-10-CM | POA: Diagnosis present

## 2022-03-12 DIAGNOSIS — K7210 Chronic hepatic failure without coma: Secondary | ICD-10-CM | POA: Diagnosis not present

## 2022-03-12 DIAGNOSIS — I959 Hypotension, unspecified: Secondary | ICD-10-CM | POA: Diagnosis not present

## 2022-03-12 DIAGNOSIS — Z452 Encounter for adjustment and management of vascular access device: Secondary | ICD-10-CM | POA: Diagnosis not present

## 2022-03-12 DIAGNOSIS — Z66 Do not resuscitate: Secondary | ICD-10-CM | POA: Diagnosis not present

## 2022-03-12 DIAGNOSIS — Z79890 Hormone replacement therapy (postmenopausal): Secondary | ICD-10-CM

## 2022-03-12 DIAGNOSIS — I5189 Other ill-defined heart diseases: Secondary | ICD-10-CM | POA: Diagnosis not present

## 2022-03-12 DIAGNOSIS — I81 Portal vein thrombosis: Secondary | ICD-10-CM | POA: Diagnosis not present

## 2022-03-12 DIAGNOSIS — N183 Chronic kidney disease, stage 3 unspecified: Secondary | ICD-10-CM | POA: Diagnosis not present

## 2022-03-12 DIAGNOSIS — A0472 Enterocolitis due to Clostridium difficile, not specified as recurrent: Secondary | ICD-10-CM | POA: Diagnosis present

## 2022-03-12 DIAGNOSIS — Z79899 Other long term (current) drug therapy: Secondary | ICD-10-CM

## 2022-03-12 DIAGNOSIS — Z888 Allergy status to other drugs, medicaments and biological substances status: Secondary | ICD-10-CM

## 2022-03-12 HISTORY — DX: Anemia, unspecified: D64.9

## 2022-03-12 HISTORY — PX: IR TIPS: IMG2295

## 2022-03-12 HISTORY — DX: Depression, unspecified: F32.A

## 2022-03-12 HISTORY — PX: IR IVUS EACH ADDITIONAL NON CORONARY VESSEL: IMG6086

## 2022-03-12 HISTORY — PX: IR US GUIDE VASC ACCESS RIGHT: IMG2390

## 2022-03-12 HISTORY — PX: IR PARACENTESIS: IMG2679

## 2022-03-12 HISTORY — DX: Anxiety disorder, unspecified: F41.9

## 2022-03-12 HISTORY — PX: RADIOLOGY WITH ANESTHESIA: SHX6223

## 2022-03-12 LAB — PROTIME-INR
INR: 1.6 — ABNORMAL HIGH (ref 0.8–1.2)
Prothrombin Time: 18.9 seconds — ABNORMAL HIGH (ref 11.4–15.2)

## 2022-03-12 LAB — BASIC METABOLIC PANEL
Anion gap: 17 — ABNORMAL HIGH (ref 5–15)
BUN: 117 mg/dL — ABNORMAL HIGH (ref 8–23)
CO2: 14 mmol/L — ABNORMAL LOW (ref 22–32)
Calcium: 8.5 mg/dL — ABNORMAL LOW (ref 8.9–10.3)
Chloride: 97 mmol/L — ABNORMAL LOW (ref 98–111)
Creatinine, Ser: 3.61 mg/dL — ABNORMAL HIGH (ref 0.44–1.00)
GFR, Estimated: 13 mL/min — ABNORMAL LOW (ref >60)
Glucose, Bld: 86 mg/dL (ref 70–99)
Potassium: 4.2 mmol/L (ref 3.5–5.1)
Sodium: 128 mmol/L — ABNORMAL LOW (ref 135–145)

## 2022-03-12 LAB — TYPE AND SCREEN
ABO/RH(D): A POS
Antibody Screen: NEGATIVE

## 2022-03-12 LAB — SURGICAL PCR SCREEN
MRSA, PCR: NEGATIVE
Staphylococcus aureus: NEGATIVE

## 2022-03-12 LAB — CBC
HCT: 32.3 % — ABNORMAL LOW (ref 36.0–46.0)
Hemoglobin: 11.1 g/dL — ABNORMAL LOW (ref 12.0–15.0)
MCH: 27.1 pg (ref 26.0–34.0)
MCHC: 34.4 g/dL (ref 30.0–36.0)
MCV: 79 fL — ABNORMAL LOW (ref 80.0–100.0)
Platelets: 212 10*3/uL (ref 150–400)
RBC: 4.09 MIL/uL (ref 3.87–5.11)
RDW: 25 % — ABNORMAL HIGH (ref 11.5–15.5)
WBC: 16.2 10*3/uL — ABNORMAL HIGH (ref 4.0–10.5)
nRBC: 0 % (ref 0.0–0.2)

## 2022-03-12 SURGERY — IR WITH ANESTHESIA
Anesthesia: General

## 2022-03-12 MED ORDER — OXYCODONE HCL 5 MG PO TABS: 5.0000 mg | ORAL_TABLET | Freq: Once | ORAL | Status: DC | PRN

## 2022-03-12 MED ORDER — LEVOTHYROXINE SODIUM 100 MCG PO TABS
200.0000 ug | ORAL_TABLET | Freq: Every morning | ORAL | Status: DC
  Administered 2022-03-13: 200 ug via ORAL
  Filled 2022-03-12: qty 2

## 2022-03-12 MED ORDER — PHENYLEPHRINE HCL-NACL 20-0.9 MG/250ML-% IV SOLN
INTRAVENOUS | Status: DC | PRN
  Administered 2022-03-12: 30 ug/min via INTRAVENOUS

## 2022-03-12 MED ORDER — CHLORHEXIDINE GLUCONATE 0.12 % MT SOLN
OROMUCOSAL | Status: AC
  Administered 2022-03-12: 15 mL via OROMUCOSAL
  Filled 2022-03-12: qty 15

## 2022-03-12 MED ORDER — SODIUM BICARBONATE 650 MG PO TABS: 650.0000 mg | ORAL_TABLET | Freq: Three times a day (TID) | ORAL | Status: DC

## 2022-03-12 MED ORDER — ALBUMIN HUMAN 5 % IV SOLN
INTRAVENOUS | Status: DC | PRN
Start: 2022-03-12 — End: 2022-03-12

## 2022-03-12 MED ORDER — PROPOFOL 10 MG/ML IV BOLUS
INTRAVENOUS | Status: DC | PRN
  Administered 2022-03-12: 120 mg via INTRAVENOUS

## 2022-03-12 MED ORDER — ENOXAPARIN SODIUM 30 MG/0.3ML IJ SOSY: 30.0000 mg | PREFILLED_SYRINGE | INTRAMUSCULAR | Status: DC

## 2022-03-12 MED ORDER — OXYCODONE HCL 5 MG PO TABS: 5.0000 mg | ORAL_TABLET | ORAL | Status: DC | PRN

## 2022-03-12 MED ORDER — LIDOCAINE HCL 1 % IJ SOLN
INTRAMUSCULAR | Status: AC
  Filled 2022-03-12: qty 20

## 2022-03-12 MED ORDER — ALLOPURINOL 100 MG PO TABS
200.0000 mg | ORAL_TABLET | Freq: Every day | ORAL | Status: DC
  Administered 2022-03-13: 200 mg via ORAL
  Filled 2022-03-12: qty 2

## 2022-03-12 MED ORDER — VANCOMYCIN HCL 125 MG PO CAPS: 125.0000 mg | ORAL_CAPSULE | Freq: Four times a day (QID) | ORAL | Status: DC

## 2022-03-12 MED ORDER — ONDANSETRON HCL 4 MG/2ML IJ SOLN
INTRAMUSCULAR | Status: DC | PRN
  Administered 2022-03-12: 4 mg via INTRAVENOUS

## 2022-03-12 MED ORDER — SUGAMMADEX SODIUM 200 MG/2ML IV SOLN
INTRAVENOUS | Status: DC | PRN
  Administered 2022-03-12: 200 mg via INTRAVENOUS

## 2022-03-12 MED ORDER — OXYCODONE HCL 5 MG/5ML PO SOLN: 5.0000 mg | Freq: Once | ORAL | Status: DC | PRN

## 2022-03-12 MED ORDER — SODIUM CHLORIDE 0.9 % IV SOLN: INTRAVENOUS | Status: DC

## 2022-03-12 MED ORDER — IOHEXOL 300 MG/ML  SOLN
100.0000 mL | Freq: Once | INTRAMUSCULAR | Status: AC | PRN
  Administered 2022-03-12: 30 mL via INTRAVENOUS

## 2022-03-12 MED ORDER — LIDOCAINE HCL (PF) 1 % IJ SOLN
INTRAMUSCULAR | Status: AC | PRN
  Administered 2022-03-12: 10 mL

## 2022-03-12 MED ORDER — NICOTINE 14 MG/24HR TD PT24
14.0000 mg | MEDICATED_PATCH | Freq: Every day | TRANSDERMAL | Status: DC
  Filled 2022-03-12: qty 1

## 2022-03-12 MED ORDER — ONDANSETRON HCL 4 MG PO TABS: 4.0000 mg | ORAL_TABLET | Freq: Four times a day (QID) | ORAL | Status: DC | PRN

## 2022-03-12 MED ORDER — FENTANYL CITRATE (PF) 100 MCG/2ML IJ SOLN: 25.0000 ug | INTRAMUSCULAR | Status: DC | PRN

## 2022-03-12 MED ORDER — AMISULPRIDE (ANTIEMETIC) 5 MG/2ML IV SOLN: 10.0000 mg | Freq: Once | INTRAVENOUS | Status: DC | PRN

## 2022-03-12 MED ORDER — ONDANSETRON HCL 4 MG/2ML IJ SOLN
4.0000 mg | Freq: Four times a day (QID) | INTRAMUSCULAR | Status: DC | PRN
  Administered 2022-03-13: 4 mg via INTRAVENOUS
  Filled 2022-03-12: qty 2

## 2022-03-12 MED ORDER — POLYSACCHARIDE IRON COMPLEX 150 MG PO CAPS
150.0000 mg | ORAL_CAPSULE | Freq: Every day | ORAL | Status: DC
  Administered 2022-03-13: 150 mg via ORAL
  Filled 2022-03-12 (×2): qty 1

## 2022-03-12 MED ORDER — SODIUM CHLORIDE 0.9% FLUSH
3.0000 mL | Freq: Two times a day (BID) | INTRAVENOUS | Status: DC
  Administered 2022-03-12 – 2022-03-13 (×3): 3 mL via INTRAVENOUS

## 2022-03-12 MED ORDER — MIDODRINE HCL 5 MG PO TABS
15.0000 mg | ORAL_TABLET | Freq: Three times a day (TID) | ORAL | Status: DC
  Administered 2022-03-12 – 2022-03-13 (×3): 15 mg via ORAL
  Filled 2022-03-12 (×3): qty 3

## 2022-03-12 MED ORDER — ORAL CARE MOUTH RINSE: 15.0000 mL | Freq: Once | OROMUCOSAL | Status: AC

## 2022-03-12 MED ORDER — ACETAMINOPHEN 325 MG PO TABS: 650.0000 mg | ORAL_TABLET | Freq: Four times a day (QID) | ORAL | Status: DC | PRN

## 2022-03-12 MED ORDER — ROCURONIUM BROMIDE 10 MG/ML (PF) SYRINGE
PREFILLED_SYRINGE | INTRAVENOUS | Status: DC | PRN
  Administered 2022-03-12: 50 mg via INTRAVENOUS
  Administered 2022-03-12: 30 mg via INTRAVENOUS

## 2022-03-12 MED ORDER — HYDRALAZINE HCL 20 MG/ML IJ SOLN: 5.0000 mg | INTRAMUSCULAR | Status: DC | PRN

## 2022-03-12 MED ORDER — SUCCINYLCHOLINE CHLORIDE 200 MG/10ML IV SOSY
PREFILLED_SYRINGE | INTRAVENOUS | Status: DC | PRN
  Administered 2022-03-12: 180 mg via INTRAVENOUS

## 2022-03-12 MED ORDER — CHLORHEXIDINE GLUCONATE 0.12 % MT SOLN: 15.0000 mL | Freq: Once | OROMUCOSAL | Status: AC

## 2022-03-12 MED ORDER — LACTATED RINGERS IV SOLN: INTRAVENOUS | Status: DC

## 2022-03-12 MED ORDER — ONDANSETRON HCL 4 MG/2ML IJ SOLN: 4.0000 mg | Freq: Once | INTRAMUSCULAR | Status: DC | PRN

## 2022-03-12 MED ORDER — IOHEXOL 300 MG/ML  SOLN
100.0000 mL | Freq: Once | INTRAMUSCULAR | Status: AC | PRN
  Administered 2022-03-12: 35 mL via INTRAVENOUS

## 2022-03-12 MED ORDER — LIDOCAINE 2% (20 MG/ML) 5 ML SYRINGE
INTRAMUSCULAR | Status: DC | PRN
  Administered 2022-03-12: 60 mg via INTRAVENOUS

## 2022-03-12 MED ORDER — CEFAZOLIN SODIUM-DEXTROSE 2-4 GM/100ML-% IV SOLN
INTRAVENOUS | Status: AC
  Filled 2022-03-12: qty 100

## 2022-03-12 MED ORDER — SODIUM CHLORIDE 0.9 % IV SOLN
1.0000 g | INTRAVENOUS | Status: AC
  Administered 2022-03-12: 1 g via INTRAVENOUS
  Filled 2022-03-12: qty 10

## 2022-03-12 MED ORDER — ACETAMINOPHEN 650 MG RE SUPP: 650.0000 mg | Freq: Four times a day (QID) | RECTAL | Status: DC | PRN

## 2022-03-12 MED ORDER — PROPOFOL 500 MG/50ML IV EMUL
INTRAVENOUS | Status: DC | PRN
  Administered 2022-03-12: 150 ug/kg/min via INTRAVENOUS

## 2022-03-12 MED ORDER — AMIODARONE HCL 200 MG PO TABS
200.0000 mg | ORAL_TABLET | Freq: Two times a day (BID) | ORAL | Status: DC
  Administered 2022-03-12 – 2022-03-13 (×2): 200 mg via ORAL
  Filled 2022-03-12 (×3): qty 1

## 2022-03-12 NOTE — Consult Note (Signed)
? ?NAME:  Diane Pruitt, MRN:  [id number], DOB:  12/26/1950, LOS: 0 ?ADMISSION DATE:  03/07/2022 CONSULTATION DATE:  03/16/2022 ?REFERRING MD:  Lorin [facility] CHIEF COMPLAINT:  Hypotension s/p TIPS  ? ?History of Present Illness:  ?71 year old woman who presented to [facility] 03/05/2022 for planned TIPS insertion. PMHx significant for HTN, Afib (not on [facility]), ischemic stroke, CKD stage IIIa, anxiety/depression, EtOH cirrhosis with portal hypertension as evidenced by PHG and ascites (requiring frequent paracentesis, last 5/8). Recently admitted to [facility] 4/25 for hepatorenal syndrome and found to be C. Diff positive with concern for SBP (s/p Cipro course), TIPS was attempted 4/29 but aborted in the setting of challenging portal vein anatomy. Discharged to SNF 5/10. ? ?Underwent TIPS with IR 5/15 (Dr. Maryelizabeth Kaufmann). Successful procedure with reduction in gradient from 15 to 8. Additionally, therapeutic paracentesis (2.2L) was completed. Post-procedure, patient was noted to be hypotensive with SBPs 70s-80s. ? ?PCCM consulted for hypotension/possible ICU vs. progressive floor admission. ? ?Pertinent Medical History:  ? ?Past Medical History:  ?Diagnosis Date  ? Anemia   ? Anxiety   ? Atrial fibrillation ([place])   ? Barrett esophagus   ? Chronic kidney disease (CKD), stage III (moderate) (HCC)   ? Cirrhosis ([place])   ? Depression   ? Dysrhythmia   ? Atrial Fibrillation 2 years ago  ? Hypertension   ? Ischemic stroke ([place]) 01/25/2018  ? ?Significant Hospital Events: ?Including procedures, antibiotic start and stop dates in addition to other pertinent events   ?4/25 Admitted to [facility] for [place], C. Diff positive, c/f SBP (s/p Cipro) ?4/29 TIPS attempted, aborted due to challenging portal vein anatomy ?5/10 Discharged to SNF ?5/15 Admitted to [facility] for TIPS placement with IR. Underwent TIPS (RIJ, R fem access). Gradient reduced from 15 to 8. Therapeutic para, 2.2L removed. Hypotensive post-procedure. PCCM consulted. ? ?Interim History / Subjective:   ?PCCM consulted for hypotension ?Patient feels "so-so" post procedure ?Denies fever/chills, CP/SOB, n/v/d; just feels "dry" ?No abdominal pain or pain at IR access sites ?SBPs run 80s at home, on midodrine TID ? ?Objective:  ?Blood pressure (!) 81/46, pulse 100, temperature 97.9 ?F (36.6 ?C), resp. rate 18, height '5\' 2"'$  (1.575 m), weight 75.3 kg, SpO2 93 %. ?   ?   ? ?Intake/Output Summary (Last 24 hours) at 03/20/2022 1917 ?Last data filed at 03/20/2022 1700 ?Gross per 24 hour  ?Intake 1850 ml  ?Output 600 ml  ?Net 1250 ml  ? ?Filed Weights  ? 03/28/2022 1008  ?Weight: 75.3 kg  ? ?Physical Examination: ?General: Chronically ill-appearing elderly woman in NAD. ?HEENT: [place]/AT, anicteric sclera, PERRL, dry mucous membranes. ?Neuro: Awake, oriented x 4. Responds to verbal stimuli. Following commands consistently. Moves all 4 extremities spontaneously. No focal neurologic deficits. ?CV: Mildly tachycardic to 100s, regular rhythm, no m/g/r. ?PULM: Breathing even and unlabored on RA. Lung fields CTAB anteriorly. ?GI: Soft, nontender, mildly distended. Normoactive bowel sounds. ?Extremities: Bilateral symmetric 1+ pitting LE edema noted. ?Skin: Warm/dry, scattered ecchymosis noted to R neck., bilateral forearms. ? ?Resolved Hospital Problem List:  ? ? ?Assessment & Plan:  ?Ms. Lemaire is seen in consultation at the request of Dr. Lorin [facility]) for recommendations on further evaluation and management of hypotension/possible ICU admission. ? ?On bedside examination, patient is overall well-appearing postprocedure.  She has no complaints at present; when asked about her blood pressures at home, patient states that she frequently has systolics in the 81W and takes midodrine 3 times daily.  Her current blood pressures are not much  lower than usual for her.  Briefly required Neo during the case, per report BP did not respond. I suspect with adequate volume resuscitation, patient's blood pressures will return to baseline.  She is  hemodynamically stable at this time and appropriate for progressive care. ? ?Periprocedural hypotension ?S/p TIPS placement 5/15 ?EtOH cirrhosis with portal hypertension ?Ascites s/p therapeutic paracentesis ?- Appropriate for Progressive Care bed ?- Keep A-line (if floor able) to ensure accurate BP; ok to discontinue if unable to manage on floor ?- Recommend albumin for additional volume resuscitation; appears dry on exam ?- Continue home midodrine TID ?- Cardiac monitoring ? ?PCCM will remain available as needed, please feel free to reach out if patient's status changes or we can be of further assistance. ? ?Best Practice: (right click and "Reselect all SmartList Selections" daily)  ? ?Per Primary Team ?Code Status:  DNR ? ?Labs:  ?CBC: ?Recent Labs  ?Lab 03/06/22 ?0232 03/07/22 ?0148 03/18/2022 ?1038  ?WBC 24.5* 24.4* 16.2*  ?NEUTROABS 22.4* 22.0*  --   ?HGB 8.7* 9.3* 11.1*  ?HCT 26.8* 27.4* 32.3*  ?MCV 81.5 80.6 79.0*  ?PLT 142* 160 212  ? ?Basic Metabolic Panel: ?Recent Labs  ?Lab 03/06/22 ?0232 03/07/22 ?0148 03/25/2022 ?1038  ?NA 131* 132* 128*  ?K 3.2* 4.0 4.2  ?CL 102 103 97*  ?CO2 18* 17* 14*  ?GLUCOSE 99 112* 86  ?BUN 75* 87* 117*  ?CREATININE 2.75* 2.95* 3.61*  ?CALCIUM 8.3* 8.3* 8.5*  ?MG 1.8  --   --   ?PHOS 5.5*  --   --   ? ?GFR: ?Estimated Creatinine Clearance: 13.8 mL/min (A) (by C-G formula based on SCr of 3.61 mg/dL (H)). ?Recent Labs  ?Lab 03/06/22 ?0232 03/07/22 ?0148 03/22/2022 ?1038  ?WBC 24.5* 24.4* 16.2*  ? ?Liver Function Tests: ?Recent Labs  ?Lab 03/06/22 ?0232 03/07/22 ?0148  ?AST 138* 177*  ?ALT 52* 58*  ?ALKPHOS 112 138*  ?BILITOT 2.1* 1.5*  ?PROT 5.6* 5.8*  ?ALBUMIN 2.8* 2.8*  ? ?No results for input(s): LIPASE, AMYLASE in the last 168 hours. ?No results for input(s): AMMONIA in the last 168 hours. ? ?ABG: ?   ?Component Value Date/Time  ? PHART 7.370 02/24/2022 1537  ? PCO2ART 31.8 (L) 02/24/2022 1537  ? PO2ART 61 (L) 02/24/2022 1537  ? HCO3 18.3 (L) 02/24/2022 1537  ? TCO2 19 (L)  02/24/2022 1537  ? ACIDBASEDEF 6.0 (H) 02/24/2022 1537  ? O2SAT 91 02/24/2022 1537  ? ?Coagulation Profile: ?Recent Labs  ?Lab 03/26/2022 ?1038  ?INR 1.6*  ? ?Cardiac Enzymes: ?No results for input(s): CKTOTAL, CKMB, CKMBINDEX, TROPONINI in the last 168 hours. ? ?HbA1C: ?Hgb A1c MFr Bld  ?Date/Time Value Ref Range Status  ?01/22/2022 12:58 AM 4.5 (L) 4.8 - 5.6 % Final  ?  Comment:  ?  (NOTE) ?Pre diabetes:          5.7%-6.4% ? ?Diabetes:              >6.4% ? ?Glycemic control for   <7.0% ?adults with diabetes ?  ?01/26/2018 03:12 AM 5.0 4.8 - 5.6 % Final  ?  Comment:  ?  (NOTE) ?Pre diabetes:          5.7%-6.4% ?Diabetes:              >6.4% ?Glycemic control for   <7.0% ?adults with diabetes ?  ? ?CBG: ?No results for input(s): GLUCAP in the last 168 hours. ? ?Review of Systems:   ?Review of systems completed with pertinent positives/negatives outlined  in above HPI. ? ?Past Medical History:  ?She,  has a past medical history of Anemia, Anxiety, Atrial fibrillation ([place]), Barrett esophagus, Chronic kidney disease (CKD), stage III (moderate) ([place]), Cirrhosis ([place]), Depression, Dysrhythmia, Hypertension, and Ischemic stroke ([place]) (01/25/2018).  ? ?Surgical History:  ? ?Past Surgical History:  ?Procedure Laterality Date  ? CESAREAN SECTION    ? ESOPHAGOGASTRODUODENOSCOPY (EGD) WITH PROPOFOL N/A 01/05/2022  ? Procedure: ESOPHAGOGASTRODUODENOSCOPY (EGD) WITH PROPOFOL;  Surgeon: Ronnette Juniper, MD;  Location: [facility];  Service: Gastroenterology;  Laterality: N/A;  ? EYE SURGERY    ? INTRAOCULAR LENS INSERTION Bilateral About 3 years ago  ? IR IVUS EACH ADDITIONAL NON CORONARY VESSEL  02/26/2022  ? IR PARACENTESIS  02/12/2022  ? IR PARACENTESIS  02/24/2022  ? IR PARACENTESIS  03/05/2022  ? IR PARACENTESIS  03/19/2022  ? IR TIPS  02/24/2022  ? IR TIPS  03/10/2022  ? IR US GUIDE VASC ACCESS RIGHT  02/24/2022  ? IR US GUIDE VASC ACCESS RIGHT  03/11/2022  ? KYPHOPLASTY N/A 02/18/2020  ? Procedure: KYPHOPLASTY L2;  Surgeon: Melina Schools,  MD;  Location: [place];  Service: Orthopedics;  Laterality: N/A;  60 mins  ? RADIOLOGY WITH ANESTHESIA N/A 02/24/2022  ? Procedure: TIPS;  Surgeon: Suzette Battiest, MD;  Location: [place];  Service: Radiology;  Laterality

## 2022-03-12 NOTE — Transfer of Care (Signed)
Immediate Anesthesia Transfer of Care Note ? ?Patient: Diane Pruitt ? ?Procedure(s) Performed: TIPS ? ?Patient Location: PACU ? ?Anesthesia Type:General ? ?Level of Consciousness: awake, alert  and oriented ? ?Airway & Oxygen Therapy: Patient Spontanous Breathing and Patient connected to face mask oxygen ? ?Post-op Assessment: Report given to RN and Post -op Vital signs reviewed and stable ? ?Post vital signs: Reviewed and stable ? ?Last Vitals:  ?Vitals Value Taken Time  ?BP 80/45 03/05/2022 1516  ?Temp    ?Pulse 92 02/28/2022 1523  ?Resp 17 03/18/2022 1523  ?SpO2 93 % 03/01/2022 1523  ?Vitals shown include unvalidated device data. ? ?Last Pain:  ?Vitals:  ? 03/13/2022 1040  ?TempSrc:   ?PainSc: 0-No pain  ?   ? ?  ? ?Complications: No notable events documented. ?

## 2022-03-12 NOTE — Progress Notes (Signed)
Phone called received from [place] at [facility] stating that this pt "appeal has been overturned and approved" regarding TIPS procedure with Dr Michaelle Birks. Provided fax number [phone] for Katherine's reference. She states she will fax information over.  ?

## 2022-03-12 NOTE — Sedation Documentation (Signed)
Pre RA 18 - Post RA 22 ?Pre MPV 33 - Post MPV 30 ?Pre Delta 15 - Post Delta 8  ?

## 2022-03-12 NOTE — Anesthesia Procedure Notes (Signed)
Procedure Name: Intubation ?Date/Time: 03/07/2022 12:00 PM ?Performed by: Griffin Dakin, CRNA ?Pre-anesthesia Checklist: Patient identified, Emergency Drugs available, Suction available and Patient being monitored ?Patient Re-evaluated:Patient Re-evaluated prior to induction ?Oxygen Delivery Method: Circle system utilized ?Preoxygenation: Pre-oxygenation with 100% oxygen ?Induction Type: IV induction ?Ventilation: Mask ventilation without difficulty ?Laryngoscope Size: Glidescope and 3 ?Grade View: Grade I ?Tube type: Oral ?Tube size: 7.0 mm ?Number of attempts: 1 ?Airway Equipment and Method: Rigid stylet and Video-laryngoscopy ?Placement Confirmation: ETT inserted through vocal cords under direct vision, positive ETCO2 and breath sounds checked- equal and bilateral ?Secured at: 23 cm ?Tube secured with: Tape ?Dental Injury: Teeth and Oropharynx as per pre-operative assessment  ? ? ? ? ?

## 2022-03-12 NOTE — Progress Notes (Signed)
IR BRIEF PROGRESS NOTE: ? ?Patient: Diane Pruitt ?Procedure Performed: TIPS ? ?Patient lying flat in PACU with RN at bedside.  Pt awake and alert, not complaining of any pain or in apparent distress.  BP with systolic in mid 54C/ low 62Y.  Right IJ access site and right groin access site are soft, without hematoma, and no active bleeding.  Paracentesis site is soft and no oozing.  A-line in place.  There is much bruising on extremities, consistent with presentation this morning and procedure related trauma to the skin. ?Patient admitted to hospitalist service and IR will continue to follow. ? ?Electronically Signed: ?Pasty Spillers, PA-C ?03/20/2022, 4:48 PM ? ? ?

## 2022-03-12 NOTE — Procedures (Signed)
Vascular and Interventional Radiology Procedure Note ? ?Patient: Diane Pruitt ?DOB: [birth year] ?Medical Record Number: [id number] ?Note Date/Time: 03/06/2022 3:27 PM  ? ?Performing Physician: Michaelle Birks, MD ?Assistant(s): Ruthann Cancer, MD ?  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  ?Procedure(s):  ?TRANSJUGULAR INTRAHEPATIC PORTOSYSTEMIC SHUNT (TIPS)  ?INTRACARDIAC ECHOCARDIOGRAPHY (ICE) ?THERAPEUTIC PARACENTESIS ?  ?Anesthesia: General Anesthesia ?Complications: None ?Estimated Blood Loss:  25 mL ?Specimens: None ?  ?Findings:  ?- access via the RIGHT jugular and femoral veins. ?- Successful ICE-guided TIPS with placement of 8 cm Viatorr stent ?- Reduction of portal HTN with portosystemic gradient from 15 to 8  ?- Therapeutic paracentesis with 2.2 L of serous ascites removed. ? ?Final report to follow once all images are reviewed and compared with previous studies. ? ?See detailed dictation with images in PACS. ?The patient tolerated the procedure well without incident or complication and was returned to PACU in stable condition.  ? ? ?Michaelle Birks, MD ?[facility] ?[facility] ? ? ?Pager. [phone] ?Clinic. [phone]  ?

## 2022-03-12 NOTE — H&P (Signed)
? ?Chief Complaint: ?Patient was seen in consultation today for Transjugular intrahepatic portal system shunt placement at the request of Dr Venia Minks ? ?Referring Physician(s): ?Mugweru,Jon ? ?Supervising Physician: Michaelle Birks ? ?Patient Status: [facility] - Out-pt ? ?History of Present Illness: ?Diane Pruitt is a 71 y.o. female ? ?Hx PAF; CVA; CKD; alcohol liver disease- hypertensive gastropathy ?Upper Gi bleed; hepatorenal syndrome ?Lives in SNF ?Discharged from [facility] 5/10 with [facility] ?ETOH Cirrhosis ?Portal HTN ?Refractory ascites ?Last para 03/05/22: 5.3 Liters ? ?Attempted TIPS 02/24/22 ?- Aborted TIPS secondary to challenging portal vein anatomy ?  ?- Elevated portosystemic gradient of 24 mmHg, consistent with portal ?HTN (RA 20, PV 44) ?  ?- Therapeutic paracentesis with 3.8L of serous ascites removed. ?  ?IMPRESSION: ?1. Portal venogram, central and portal venous manometry consistent ?with portal hypertension. Portosystemic gradient markedly elevated ?at 44 mmHg. ?2. Aborted TIPS secondary to challenging anatomy ? ?Rescheduled to today with Dr Maryelizabeth Kaufmann for TIPS ?Pt is aware of plan and agreeable ? ? ?Past Medical History:  ?Diagnosis Date  ? Anemia   ? Anxiety   ? Atrial fibrillation ([place])   ? Barrett esophagus   ? Chronic kidney disease (CKD), stage III (moderate) (HCC)   ? Cirrhosis ([place])   ? Depression   ? Dysrhythmia   ? Atrial Fibrillation 2 years ago  ? Hypertension   ? Ischemic stroke ([place]) 01/25/2018  ? ? ?Past Surgical History:  ?Procedure Laterality Date  ? CESAREAN SECTION    ? ESOPHAGOGASTRODUODENOSCOPY (EGD) WITH PROPOFOL N/A 01/05/2022  ? Procedure: ESOPHAGOGASTRODUODENOSCOPY (EGD) WITH PROPOFOL;  Surgeon: Ronnette Juniper, MD;  Location: [facility];  Service: Gastroenterology;  Laterality: N/A;  ? EYE SURGERY    ? INTRAOCULAR LENS INSERTION Bilateral About 3 years ago  ? IR PARACENTESIS  02/12/2022  ? IR PARACENTESIS  02/24/2022  ? IR PARACENTESIS  03/05/2022  ? IR TIPS  02/24/2022  ? IR US GUIDE  VASC ACCESS RIGHT  02/24/2022  ? KYPHOPLASTY N/A 02/18/2020  ? Procedure: KYPHOPLASTY L2;  Surgeon: Melina Schools, MD;  Location: [place];  Service: Orthopedics;  Laterality: N/A;  60 mins  ? RADIOLOGY WITH ANESTHESIA N/A 02/24/2022  ? Procedure: TIPS;  Surgeon: Suzette Battiest, MD;  Location: [facility];  Service: Radiology;  Laterality: N/A;  ? TONSILLECTOMY    ? TUBAL LIGATION    ? ? ?Allergies: ?Colchicine ? ?Medications: ?Prior to Admission medications   ?Medication Sig Start Date End Date Taking? Authorizing Provider  ?acetaminophen (TYLENOL) 500 MG tablet Take 1,000 mg by mouth every 6 (six) hours as needed for headache or mild pain.    [provider]  ?alendronate (FOSAMAX) 70 MG tablet Take 70 mg by mouth every Monday.    [provider]  ?allopurinol (ZYLOPRIM) 100 MG tablet Take 200 mg by mouth daily. 01/20/20   [provider]  ?amiodarone (PACERONE) 200 MG tablet Take 200 mg by mouth in the morning and at bedtime.  02/27/18   [provider]  ?Cholecalciferol (VITAMIN D) 50 MCG (2000 UT) tablet Take 2,000 Units by mouth daily. 05/03/21   [provider]  ?ciprofloxacin (CIPRO) 500 MG tablet Take 1 tablet (500 mg total) by mouth daily with breakfast for 5 days. 03/07/22 03/03/2022  [place] Eric J, DO  ?iron polysaccharides (NIFEREX) 150 MG capsule Take 1 capsule (150 mg total) by mouth daily. 01/27/22   Kathie Dike, MD  ?lactulose (CHRONULAC) 10 GM/15ML solution Take 15 mLs (10 g total) by mouth 2 (two)  times daily as needed for mild constipation (Ensure 2-3 soft bowel movements daily). 03/07/22   [place] Donnamarie Poag, DO  ?levothyroxine (SYNTHROID) 200 MCG tablet Take 200 mcg by mouth every morning. 12/26/21   [provider]  ?midodrine (PROAMATINE) 5 MG tablet Take 3 tablets (15 mg total) by mouth 3 (three) times daily with meals. 03/07/22   [place] Eric J, DO  ?Multiple Vitamins-Minerals (PRESERVISION AREDS 2 PO) Take 1 capsule by mouth daily. Preservision AREDS-2 250  mg- 90 mg- 40 mg-1 mg    [provider]  ?pantoprazole (PROTONIX) 40 MG tablet Take 1 tablet (40 mg total) by mouth 2 (two) times daily before a meal. 01/26/22 03/08/22  Kathie Dike, MD  ?polyethylene glycol (MIRALAX / GLYCOLAX) 17 g packet Take 17 g by mouth daily as needed for mild constipation. 01/26/22   Kathie Dike, MD  ?sodium bicarbonate 650 MG tablet Take 1 tablet (650 mg total) by mouth 3 (three) times daily. 03/07/22   [place] Donnamarie Poag, DO  ?vancomycin (VANCOCIN) 125 MG capsule Take 1 capsule (125 mg total) by mouth 4 (four) times daily for 5 days. 03/07/22 03/02/2022  [place] Eric J, DO  ?  ? ?Family History  ?Problem Relation Age of Onset  ? Hypertension Mother   ? Hyperlipidemia Mother   ? Atrial fibrillation Mother   ? Hyperlipidemia Father   ? Atrial fibrillation Father   ? ? ?Social History  ? ?Socioeconomic History  ? Marital status: Divorced  ?  Spouse name: Not on file  ? Number of children: Not on file  ? Years of education: Not on file  ? Highest education level: Not on file  ?Occupational History  ? Not on file  ?Tobacco Use  ? Smoking status: Never  ? Smokeless tobacco: Never  ?Vaping Use  ? Vaping Use: Never used  ?Substance and Sexual Activity  ? Alcohol use: Not Currently  ?  Alcohol/week: 42.0 standard drinks  ?  Types: 42 Shots of liquor per week  ?  Comment: 6 vodkas per day  ? Drug use: Not Currently  ? Sexual activity: Not on file  ?Other Topics Concern  ? Not on file  ?Social History Narrative  ? Not on file  ? ?Social Determinants of Health  ? ?Financial Resource Strain: Not on file  ?Food Insecurity: Not on file  ?Transportation Needs: Not on file  ?Physical Activity: Not on file  ?Stress: Not on file  ?Social Connections: Not on file  ? ? ?Review of Systems: A 12 point ROS discussed and pertinent positives are indicated in the HPI above.  All other systems are negative. ? ?Review of Systems  ?Constitutional:  Positive for activity change and fatigue. Negative for fever.   ?HENT:  Negative for sore throat and trouble swallowing.   ?Respiratory:  Negative for cough and shortness of breath.   ?Cardiovascular:  Negative for chest pain.  ?Gastrointestinal:  Positive for abdominal distention and diarrhea. Negative for abdominal pain.  ?Neurological:  Positive for weakness.  ?Psychiatric/Behavioral:  Negative for behavioral problems and confusion.   ? ?Vital Signs: ? T: 97.6; P 94; BP 85/52 ? ?Physical Exam ?Vitals reviewed.  ?HENT:  ?   Mouth/Throat:  ?   Mouth: Mucous membranes are moist.  ?Cardiovascular:  ?   Rate and Rhythm: Normal rate and regular rhythm.  ?   Heart sounds: Normal heart sounds.  ?Pulmonary:  ?   Effort: Pulmonary effort is normal.  ?   Breath sounds: Normal  breath sounds.  ?Abdominal:  ?   General: There is distension.  ?   Palpations: Abdomen is soft.  ?   Tenderness: There is no abdominal tenderness.  ?Musculoskeletal:     ?   General: Normal range of motion.  ?Skin: ?   General: Skin is warm.  ?Neurological:  ?   Mental Status: She is alert and oriented to person, place, and time.  ?Psychiatric:     ?   Behavior: Behavior normal.  ? ? ?Imaging: ?DG Chest 1 View ? ?Result Date: 02/24/2022 ?CLINICAL DATA:  Shortness of breath EXAM: CHEST  1 VIEW COMPARISON:  Previous studies including the examination of 02/20/2022 FINDINGS: Transverse diameter of heart is increased. Dense calcification is seen in the mitral annulus. Central pulmonary vessels are prominent. Increased interstitial markings are seen in the parahilar regions. There are small linear densities in the right mid and both lower lung fields. Costophrenic angles are clear. There is no pneumothorax. IMPRESSION: Cardiomegaly. Central pulmonary vessels are more prominent suggesting CHF. There is increase in interstitial markings in the parahilar regions, possibly suggesting interstitial edema. There are linear densities in the parahilar regions and lower lung fields suggesting subsegmental atelectasis.  Electronically Signed   By: Elmer Picker M.D.   On: 02/24/2022 17:23  ? ?DG Chest 2 View ? ?Result Date: 02/12/2022 ?CLINICAL DATA:  Shortness of breath. EXAM: CHEST - 2 VIEW COMPARISON:  None. FINDINGS: Low

## 2022-03-12 NOTE — Anesthesia Postprocedure Evaluation (Signed)
Anesthesia Post Note ? ?Patient: Diane Pruitt ? ?Procedure(s) Performed: TIPS ? ?  ? ?Patient location during evaluation: PACU ?Anesthesia Type: General ?Level of consciousness: awake and alert ?Pain management: pain level controlled ?Vital Signs Assessment: post-procedure vital signs reviewed and stable ?Respiratory status: spontaneous breathing, nonlabored ventilation, respiratory function stable and patient connected to nasal cannula oxygen ?Cardiovascular status: blood pressure returned to baseline and stable ?Postop Assessment: no apparent nausea or vomiting ?Anesthetic complications: no ? ? ?No notable events documented. ? ?Last Vitals:  ?Vitals:  ? 03/18/2022 1530 03/05/2022 1545  ?BP: (!) 80/47 (!) 81/47  ?Pulse: 93 100  ?Resp: 18 18  ?Temp:    ?SpO2: 94% 93%  ?  ?Last Pain:  ?Vitals:  ? 03/13/2022 1545  ?TempSrc:   ?PainSc: 0-No pain  ? ? ?  ?  ?  ?  ?  ?  ? ?Audry Pili ? ? ? ? ?

## 2022-03-12 NOTE — H&P (Signed)
?History and Physical  ? ? ?Patient: Diane Pruitt ZOX:[id number] DOB: [birth year] ?DOA: 03/18/2022 ?DOS: the patient was seen and examined on 03/16/2022 ?PCP: Diane Lass, MD  ?Patient coming from: SNF [facility] rehab; NOK: Daughter, Diane Pruitt, [phone] ? ? ?Chief Complaint: TIPS ? ?HPI: Diane Pruitt is a 71 y.o. female with medical history significant of depression; PAF not on AC; CVA; stage 3a CKD; alcoholic cirrhosis with h/o hypertensive gastropathy presenting for TIPS.  She was initially admitted from [facility] on 4/25 for hepatorenal syndrome and was found to be C diff positive.  She had paracentesis on 4/25, 4/29, and 5/8 and was started empirically on Cipro for possible SBP.  TIPS was attempted on 4/29 but was aborted due to challenging portal vein anatomy.  She was discharged to SNF and was brought back today for re-attempt at TIPS.  She is not having pain but feels tired and has not yet been able to participate in therapy.  She feels anxious about today's procedure. ? ? ? ?ER Course:  IR direct admission, per Dr. Maryelizabeth Pruitt: ? ?She's coming back electively for re-TIPS. Medically more complex for our procedural service, including hepatorenal mgmt and C diff. Was recently discharged from Hospitalist service here at [facility] on 5/15. ? ? ? ? ?Review of Systems: As mentioned in the history of present illness. All other systems reviewed and are negative. ?Past Medical History:  ?Diagnosis Date  ? Anemia   ? Anxiety   ? Atrial fibrillation ([place])   ? Barrett esophagus   ? Chronic kidney disease (CKD), stage III (moderate) (HCC)   ? Cirrhosis ([place])   ? Depression   ? Dysrhythmia   ? Atrial Fibrillation 2 years ago  ? Hypertension   ? Ischemic stroke ([place]) 01/25/2018  ? ?Past Surgical History:  ?Procedure Laterality Date  ? CESAREAN SECTION    ? ESOPHAGOGASTRODUODENOSCOPY (EGD) WITH PROPOFOL N/A 01/05/2022  ? Procedure: ESOPHAGOGASTRODUODENOSCOPY (EGD) WITH PROPOFOL;  Surgeon: Diane Juniper, MD;  Location: [facility]  ENDOSCOPY;  Service: Gastroenterology;  Laterality: N/A;  ? EYE SURGERY    ? INTRAOCULAR LENS INSERTION Bilateral About 3 years ago  ? IR IVUS EACH ADDITIONAL NON CORONARY VESSEL  03/23/2022  ? IR PARACENTESIS  02/12/2022  ? IR PARACENTESIS  02/24/2022  ? IR PARACENTESIS  03/05/2022  ? IR PARACENTESIS  03/19/2022  ? IR TIPS  02/24/2022  ? IR TIPS  03/05/2022  ? IR US GUIDE VASC ACCESS RIGHT  02/24/2022  ? IR US GUIDE VASC ACCESS RIGHT  03/17/2022  ? KYPHOPLASTY N/A 02/18/2020  ? Procedure: KYPHOPLASTY L2;  Surgeon: Diane Schools, MD;  Location: [place];  Service: Orthopedics;  Laterality: N/A;  60 mins  ? RADIOLOGY WITH ANESTHESIA N/A 02/24/2022  ? Procedure: TIPS;  Surgeon: Diane Battiest, MD;  Location: [place];  Service: Radiology;  Laterality: N/A;  ? TONSILLECTOMY    ? TUBAL LIGATION    ? ?Social History:  reports that she has never smoked. She has never used smokeless tobacco. She reports that she does not currently use alcohol after a past usage of about 42.0 standard drinks per week. She reports that she does not currently use drugs. ? ?Allergies  ?Allergen Reactions  ? Colchicine Other (See Comments)  ?  Stomach upset, diarrhea ?Other reaction(s): stomach upset  ? ? ?Family History  ?Problem Relation Age of Onset  ? Hypertension Mother   ? Hyperlipidemia Mother   ? Atrial fibrillation Mother   ? Hyperlipidemia Father   ? Atrial fibrillation  Father   ? ? ?Prior to Admission medications   ?Medication Sig Start Date End Date Taking? Authorizing Provider  ?acetaminophen (TYLENOL) 500 MG tablet Take 1,000 mg by mouth every 6 (six) hours as needed for headache or mild pain.   Yes [provider]  ?alendronate (FOSAMAX) 70 MG tablet Take 70 mg by mouth every Monday.   Yes [provider]  ?allopurinol (ZYLOPRIM) 100 MG tablet Take 200 mg by mouth daily. 01/20/20  Yes [provider]  ?amiodarone (PACERONE) 200 MG tablet Take 200 mg by mouth in the morning and at bedtime.  02/27/18  Yes [provider]  ?Cholecalciferol (VITAMIN D) 50 MCG (2000 UT) tablet Take 2,000 Units by mouth daily. 05/03/21  Yes [provider]  ?iron polysaccharides (NIFEREX) 150 MG capsule Take 1 capsule (150 mg total) by mouth daily. 01/27/22  Yes Kathie Dike, MD  ?lactulose (CHRONULAC) 10 GM/15ML solution Take 15 mLs (10 g total) by mouth 2 (two) times daily as needed for mild constipation (Ensure 2-3 soft bowel movements daily). 03/07/22  Yes [place] Diane Poag, DO  ?levothyroxine (SYNTHROID) 200 MCG tablet Take 200 mcg by mouth every morning. 12/26/21  Yes [provider]  ?midodrine (PROAMATINE) 5 MG tablet Take 3 tablets (15 mg total) by mouth 3 (three) times daily with meals. 03/07/22  Yes [place] Diane Poag, DO  ?Multiple Vitamins-Minerals (PRESERVISION AREDS 2 PO) Take 1 capsule by mouth daily. Preservision AREDS-2 250 mg- 90 mg- 40 mg-1 mg   Yes [provider]  ?polyethylene glycol (MIRALAX / GLYCOLAX) 17 g packet Take 17 g by mouth daily as needed for mild constipation. 01/26/22  Yes Kathie Dike, MD  ?sodium bicarbonate 650 MG tablet Take 1 tablet (650 mg total) by mouth 3 (three) times daily. 03/07/22  Yes [place] Diane Poag, DO  ?vancomycin (VANCOCIN) 125 MG capsule Take 1 capsule (125 mg total) by mouth 4 (four) times daily for 5 days. 03/07/22 03/26/2022 Yes [place] Diane Poag, DO  ?pantoprazole (PROTONIX) 40 MG tablet Take 1 tablet (40 mg total) by mouth 2 (two) times daily before a meal. 01/26/22 03/08/22  Kathie Dike, MD  ? ? ?Physical Exam: ?Vitals:  ? 03/22/2022 1700 03/11/2022 1702 03/27/2022 1705 03/02/2022 1715  ?BP: (!) 81/49 (!) 81/48 (!) 82/49 (!) 83/46  ?Pulse: 95     ?Resp: '19 19 19 17  '$ ?Temp:      ?TempSrc:      ?SpO2: 94%   94%  ?Weight:      ?Height:      ? ?General:  Appears calm and comfortable and is in NAD ?Eyes:   EOMI, normal lids, iris ?ENT:  grossly normal hearing, lips & tongue, mmm; suboptimal dentition ?Neck:  no LAD, masses or thyromegaly ?Cardiovascular:  RRR, no m/r/g. No LE  edema.  ?Respiratory:   CTA bilaterally with no wheezes/rales/rhonchi.  Normal respiratory effort. ?Abdomen:  soft, NT, ND ?Skin:  no rash or induration seen on limited exam ?Musculoskeletal:  grossly normal tone BUE/BLE, good ROM, no bony abnormality ?Psychiatric:  grossly normal mood and affect, speech fluent and appropriate, AOx3 ?Neurologic:  CN 2-12 grossly intact, moves all extremities in coordinated fashion ? ? ?Radiological Exams on Admission: ?Independently reviewed - see discussion in A/P where applicable ? ?IR Tips ? ?Result Date: 03/07/2022 ?CLINICAL DATA:  Briefly, 71 year old female with decompensated EtOH cirrhosis and portal hypertension. Aborted TIPS secondary to challenging anatomy on 02/24/22. Pt presents for re-TIPS. MELD = 21 (02/26/2022). Child Pugh class  B, 8 points. EXAM: Procedures: 1. TRANSJUGULAR INTRAHEPATIC PORTOSYSTEMIC SHUNT (TIPS) 2. INTRACARDIAC ECHOCARDIOGRAPHY (ICE) 3. THERAPEUTIC PARACENTESIS Performing Physician: Michaelle Birks, MD Assistant(s): Ruthann Cancer, MD, MD A qualified trainee/resident or advanced practice provider (APP) was not immediately available to assist with this case. MEDICATIONS: As antibiotic prophylaxis, Rocephin 1 gm IV was ordered pre-procedure and administered intravenously within one hour of incision. ANESTHESIA/SEDATION: Sedation by the Anesthesia Team was performed. Please see anesthesiology log for details. CONTRAST:  65 mL Omnipaque 300, intravenous FLUOROSCOPY TIME:  Fluoroscopic dose; 295 mGy PROCEDURE: Informed written consent was obtained from the the patient and/or patient's representative after a thorough discussion of the procedural risks, benefits and alternatives. All questions were addressed. Maximal Sterile Barrier Technique was utilized including caps, mask, sterile gowns, sterile gloves, sterile drape, hand hygiene and skin antiseptic. A timeout was performed prior to the initiation of the procedure. Initial ultrasound scanning demonstrates a  large amount of ascites within the RIGHT abdomen. Under direct ultrasound guidance, a 6 Fr Safe-T-Centesis catheter was introduced. An ultrasound image was saved for documentation purposes. Therapeutic paracentesi

## 2022-03-12 NOTE — Anesthesia Procedure Notes (Signed)
Arterial Line Insertion ?Start/End05/01/2022 11:00 AM, 03/11/2022 11:10 AM ?Performed by: Merlinda Frederick, MD, Valda Favia, CRNA, CRNA ? Patient location: Pre-op. ?Preanesthetic checklist: patient identified, IV checked, site marked, risks and benefits discussed, surgical consent, monitors and equipment checked, pre-op evaluation, timeout performed and anesthesia consent ?Lidocaine 1% used for infiltration ?Left, radial was placed ?Catheter size: 20 G ?Hand hygiene performed  and maximum sterile barriers used  ? ?Attempts: 1 ?Procedure performed without using ultrasound guided technique. ?Following insertion, dressing applied and Biopatch. ?Post procedure assessment: normal and unchanged ? ?Patient tolerated the procedure well with no immediate complications. ? ? ?

## 2022-03-13 ENCOUNTER — Inpatient Hospital Stay (HOSPITAL_COMMUNITY): Payer: Medicare HMO

## 2022-03-13 ENCOUNTER — Encounter (HOSPITAL_COMMUNITY): Payer: Self-pay | Admitting: Interventional Radiology

## 2022-03-13 ENCOUNTER — Other Ambulatory Visit (HOSPITAL_COMMUNITY): Payer: Self-pay | Admitting: Radiology

## 2022-03-13 DIAGNOSIS — L8990 Pressure ulcer of unspecified site, unspecified stage: Secondary | ICD-10-CM | POA: Insufficient documentation

## 2022-03-13 DIAGNOSIS — E039 Hypothyroidism, unspecified: Secondary | ICD-10-CM | POA: Diagnosis not present

## 2022-03-13 DIAGNOSIS — J9811 Atelectasis: Secondary | ICD-10-CM | POA: Diagnosis not present

## 2022-03-13 DIAGNOSIS — I959 Hypotension, unspecified: Secondary | ICD-10-CM

## 2022-03-13 DIAGNOSIS — K7031 Alcoholic cirrhosis of liver with ascites: Secondary | ICD-10-CM | POA: Diagnosis not present

## 2022-03-13 DIAGNOSIS — Z7189 Other specified counseling: Secondary | ICD-10-CM | POA: Diagnosis not present

## 2022-03-13 DIAGNOSIS — K767 Hepatorenal syndrome: Secondary | ICD-10-CM | POA: Diagnosis not present

## 2022-03-13 DIAGNOSIS — E440 Moderate protein-calorie malnutrition: Secondary | ICD-10-CM | POA: Diagnosis not present

## 2022-03-13 DIAGNOSIS — I951 Orthostatic hypotension: Secondary | ICD-10-CM | POA: Diagnosis not present

## 2022-03-13 DIAGNOSIS — K766 Portal hypertension: Secondary | ICD-10-CM | POA: Diagnosis not present

## 2022-03-13 DIAGNOSIS — Z452 Encounter for adjustment and management of vascular access device: Secondary | ICD-10-CM | POA: Diagnosis not present

## 2022-03-13 DIAGNOSIS — K3189 Other diseases of stomach and duodenum: Secondary | ICD-10-CM

## 2022-03-13 DIAGNOSIS — J984 Other disorders of lung: Secondary | ICD-10-CM | POA: Diagnosis not present

## 2022-03-13 DIAGNOSIS — Z515 Encounter for palliative care: Secondary | ICD-10-CM | POA: Diagnosis not present

## 2022-03-13 DIAGNOSIS — A498 Other bacterial infections of unspecified site: Secondary | ICD-10-CM | POA: Diagnosis not present

## 2022-03-13 DIAGNOSIS — R69 Illness, unspecified: Secondary | ICD-10-CM | POA: Diagnosis not present

## 2022-03-13 DIAGNOSIS — Z66 Do not resuscitate: Secondary | ICD-10-CM | POA: Diagnosis not present

## 2022-03-13 DIAGNOSIS — I517 Cardiomegaly: Secondary | ICD-10-CM | POA: Diagnosis not present

## 2022-03-13 DIAGNOSIS — R579 Shock, unspecified: Secondary | ICD-10-CM | POA: Diagnosis not present

## 2022-03-13 DIAGNOSIS — I480 Paroxysmal atrial fibrillation: Secondary | ICD-10-CM

## 2022-03-13 LAB — CBC WITH DIFFERENTIAL/PLATELET
Abs Immature Granulocytes: 0.15 10*3/uL — ABNORMAL HIGH (ref 0.00–0.07)
Basophils Absolute: 0 10*3/uL (ref 0.0–0.1)
Basophils Relative: 0 %
Eosinophils Absolute: 0 10*3/uL (ref 0.0–0.5)
Eosinophils Relative: 0 %
HCT: 25.9 % — ABNORMAL LOW (ref 36.0–46.0)
Hemoglobin: 9 g/dL — ABNORMAL LOW (ref 12.0–15.0)
Immature Granulocytes: 1 %
Lymphocytes Relative: 4 %
Lymphs Abs: 0.8 10*3/uL (ref 0.7–4.0)
MCH: 27.5 pg (ref 26.0–34.0)
MCHC: 34.7 g/dL (ref 30.0–36.0)
MCV: 79.2 fL — ABNORMAL LOW (ref 80.0–100.0)
Monocytes Absolute: 1 10*3/uL (ref 0.1–1.0)
Monocytes Relative: 5 %
Neutro Abs: 17.9 10*3/uL — ABNORMAL HIGH (ref 1.7–7.7)
Neutrophils Relative %: 90 %
Platelets: 190 10*3/uL (ref 150–400)
RBC: 3.27 MIL/uL — ABNORMAL LOW (ref 3.87–5.11)
RDW: 24.8 % — ABNORMAL HIGH (ref 11.5–15.5)
WBC: 19.9 10*3/uL — ABNORMAL HIGH (ref 4.0–10.5)
nRBC: 0 % (ref 0.0–0.2)

## 2022-03-13 LAB — COMPREHENSIVE METABOLIC PANEL
ALT: 106 U/L — ABNORMAL HIGH (ref 0–44)
AST: 433 U/L — ABNORMAL HIGH (ref 15–41)
Albumin: 2.7 g/dL — ABNORMAL LOW (ref 3.5–5.0)
Alkaline Phosphatase: 150 U/L — ABNORMAL HIGH (ref 38–126)
Anion gap: 15 (ref 5–15)
BUN: 109 mg/dL — ABNORMAL HIGH (ref 8–23)
CO2: 15 mmol/L — ABNORMAL LOW (ref 22–32)
Calcium: 8.2 mg/dL — ABNORMAL LOW (ref 8.9–10.3)
Chloride: 101 mmol/L (ref 98–111)
Creatinine, Ser: 3.37 mg/dL — ABNORMAL HIGH (ref 0.44–1.00)
GFR, Estimated: 14 mL/min — ABNORMAL LOW (ref >60)
Glucose, Bld: 74 mg/dL (ref 70–99)
Potassium: 3.8 mmol/L (ref 3.5–5.1)
Sodium: 131 mmol/L — ABNORMAL LOW (ref 135–145)
Total Bilirubin: 3.3 mg/dL — ABNORMAL HIGH (ref 0.3–1.2)
Total Protein: 5.8 g/dL — ABNORMAL LOW (ref 6.5–8.1)

## 2022-03-13 LAB — CBC
HCT: 27.5 % — ABNORMAL LOW (ref 36.0–46.0)
HCT: 25.1 % — ABNORMAL LOW (ref 36.0–46.0)
Hemoglobin: 9.2 g/dL — ABNORMAL LOW (ref 12.0–15.0)
Hemoglobin: 8.3 g/dL — ABNORMAL LOW (ref 12.0–15.0)
MCH: 26.7 pg (ref 26.0–34.0)
MCH: 26.6 pg (ref 26.0–34.0)
MCHC: 33.5 g/dL (ref 30.0–36.0)
MCHC: 33.1 g/dL (ref 30.0–36.0)
MCV: 79.7 fL — ABNORMAL LOW (ref 80.0–100.0)
MCV: 80.4 fL (ref 80.0–100.0)
Platelets: 207 10*3/uL (ref 150–400)
Platelets: 244 10*3/uL (ref 150–400)
RBC: 3.45 MIL/uL — ABNORMAL LOW (ref 3.87–5.11)
RBC: 3.12 MIL/uL — ABNORMAL LOW (ref 3.87–5.11)
RDW: 24.9 % — ABNORMAL HIGH (ref 11.5–15.5)
RDW: 24.7 % — ABNORMAL HIGH (ref 11.5–15.5)
WBC: 19.2 10*3/uL — ABNORMAL HIGH (ref 4.0–10.5)
WBC: 25 10*3/uL — ABNORMAL HIGH (ref 4.0–10.5)
nRBC: 0.2 % (ref 0.0–0.2)
nRBC: 0.3 % — ABNORMAL HIGH (ref 0.0–0.2)

## 2022-03-13 LAB — LACTIC ACID, PLASMA
Lactic Acid, Venous: 1.9 mmol/L (ref 0.5–1.9)
Lactic Acid, Venous: 3.1 mmol/L (ref 0.5–1.9)

## 2022-03-13 LAB — CORTISOL: Cortisol, Plasma: 68.3 ug/dL

## 2022-03-13 LAB — PROCALCITONIN: Procalcitonin: 1.7 ng/mL

## 2022-03-13 LAB — FOLATE: Folate: 13.4 ng/mL (ref >5.9)

## 2022-03-13 LAB — GLUCOSE, CAPILLARY
Glucose-Capillary: 104 mg/dL — ABNORMAL HIGH (ref 70–99)
Glucose-Capillary: 72 mg/dL (ref 70–99)

## 2022-03-13 LAB — VITAMIN B12: Vitamin B-12: 2535 pg/mL — ABNORMAL HIGH (ref 180–914)

## 2022-03-13 MED ORDER — SODIUM CHLORIDE 0.9% FLUSH
10.0000 mL | Freq: Two times a day (BID) | INTRAVENOUS | Status: DC
  Administered 2022-03-13: 30 mL

## 2022-03-13 MED ORDER — ENSURE ENLIVE PO LIQD
237.0000 mL | Freq: Two times a day (BID) | ORAL | Status: DC
  Administered 2022-03-13: 237 mL via ORAL

## 2022-03-13 MED ORDER — VANCOMYCIN HCL 125 MG PO CAPS
125.0000 mg | ORAL_CAPSULE | Freq: Four times a day (QID) | ORAL | Status: DC
Start: 2022-03-13 — End: 2022-03-14
  Administered 2022-03-13: 125 mg via ORAL
  Filled 2022-03-13 (×3): qty 1

## 2022-03-13 MED ORDER — VASOPRESSIN 20 UNITS/100 ML INFUSION FOR SHOCK
0.0000 [IU]/min | INTRAVENOUS | Status: DC
  Administered 2022-03-13: 0.03 [IU]/min via INTRAVENOUS
  Filled 2022-03-13 (×2): qty 100

## 2022-03-13 MED ORDER — VANCOMYCIN HCL 1500 MG/300ML IV SOLN
1500.0000 mg | Freq: Once | INTRAVENOUS | Status: AC
  Administered 2022-03-13: 1500 mg via INTRAVENOUS
  Filled 2022-03-13: qty 300

## 2022-03-13 MED ORDER — CHLORHEXIDINE GLUCONATE CLOTH 2 % EX PADS
6.0000 | MEDICATED_PAD | Freq: Every day | CUTANEOUS | Status: DC
  Administered 2022-03-13: 6 via TOPICAL

## 2022-03-13 MED ORDER — SODIUM CHLORIDE 0.9 % IV SOLN: INTRAVENOUS | Status: DC

## 2022-03-13 MED ORDER — VANCOMYCIN 50 MG/ML ORAL SOLUTION: 125.0000 mg | Freq: Two times a day (BID) | ORAL | Status: DC

## 2022-03-13 MED ORDER — SODIUM CHLORIDE 0.9 % IV SOLN
250.0000 mL | INTRAVENOUS | Status: DC
  Administered 2022-03-13: 250 mL via INTRAVENOUS

## 2022-03-13 MED ORDER — FENTANYL CITRATE (PF) 100 MCG/2ML IJ SOLN
12.5000 ug | INTRAMUSCULAR | Status: DC | PRN
  Administered 2022-03-13 – 2022-03-14 (×2): 12.5 ug via INTRAVENOUS
  Filled 2022-03-13 (×2): qty 2

## 2022-03-13 MED ORDER — ALBUMIN HUMAN 25 % IV SOLN
25.0000 g | Freq: Four times a day (QID) | INTRAVENOUS | Status: AC
  Administered 2022-03-13 (×3): 25 g via INTRAVENOUS
  Filled 2022-03-13 (×2): qty 100

## 2022-03-13 MED ORDER — NOREPINEPHRINE 16 MG/250ML-% IV SOLN
0.0000 ug/min | INTRAVENOUS | Status: DC
  Filled 2022-03-13: qty 250

## 2022-03-13 MED ORDER — NOREPINEPHRINE 4 MG/250ML-% IV SOLN
2.0000 ug/min | INTRAVENOUS | Status: DC
  Administered 2022-03-13: 2 ug/min via INTRAVENOUS
  Filled 2022-03-13 (×2): qty 250

## 2022-03-13 MED ORDER — SODIUM CHLORIDE 0.9 % IV BOLUS
500.0000 mL | Freq: Once | INTRAVENOUS | Status: AC
Start: 2022-03-13 — End: 2022-03-13
  Administered 2022-03-13: 500 mL via INTRAVENOUS

## 2022-03-13 MED ORDER — VANCOMYCIN VARIABLE DOSE PER UNSTABLE RENAL FUNCTION (PHARMACIST DOSING): Status: DC

## 2022-03-13 MED ORDER — MIDODRINE HCL 5 MG PO TABS
15.0000 mg | ORAL_TABLET | Freq: Four times a day (QID) | ORAL | Status: DC
  Administered 2022-03-13: 15 mg via ORAL
  Administered 2022-03-14: 5 mg via ORAL
  Filled 2022-03-13 (×2): qty 3

## 2022-03-13 MED ORDER — SODIUM CHLORIDE 0.9% FLUSH: 10.0000 mL | INTRAVENOUS | Status: DC | PRN

## 2022-03-13 MED ORDER — NOREPINEPHRINE 16 MG/250ML-% IV SOLN
2.0000 ug/min | INTRAVENOUS | Status: DC
  Administered 2022-03-13: 10 ug/min via INTRAVENOUS
  Filled 2022-03-13: qty 250

## 2022-03-13 MED ORDER — ALBUMIN HUMAN 5 % IV SOLN
12.5000 g | Freq: Once | INTRAVENOUS | Status: AC
  Administered 2022-03-13: 12.5 g via INTRAVENOUS
  Filled 2022-03-13: qty 250

## 2022-03-13 MED ORDER — LACTULOSE 10 GM/15ML PO SOLN
20.0000 g | Freq: Three times a day (TID) | ORAL | Status: DC
  Filled 2022-03-13: qty 30

## 2022-03-13 MED ORDER — LACTATED RINGERS IV BOLUS: 500.0000 mL | Freq: Once | INTRAVENOUS | Status: DC

## 2022-03-13 MED ORDER — SODIUM BICARBONATE 650 MG PO TABS
1300.0000 mg | ORAL_TABLET | Freq: Three times a day (TID) | ORAL | Status: DC
  Administered 2022-03-13 (×3): 1300 mg via ORAL
  Filled 2022-03-13 (×3): qty 2

## 2022-03-13 MED ORDER — ALBUMIN HUMAN 25 % IV SOLN
12.5000 g | Freq: Once | INTRAVENOUS | Status: AC
  Administered 2022-03-13: 12.5 g via INTRAVENOUS
  Filled 2022-03-13: qty 50

## 2022-03-13 MED ORDER — ADULT MULTIVITAMIN W/MINERALS CH
1.0000 | ORAL_TABLET | Freq: Every day | ORAL | Status: DC
  Administered 2022-03-13: 1 via ORAL
  Filled 2022-03-13: qty 1

## 2022-03-13 MED ORDER — SODIUM CHLORIDE 0.9 % IV SOLN
2.0000 g | INTRAVENOUS | Status: DC
  Administered 2022-03-13: 2 g via INTRAVENOUS
  Filled 2022-03-13: qty 12.5

## 2022-03-13 NOTE — Progress Notes (Signed)
? ? ?Supervising Physician: Michaelle Birks ? ?Patient Status:  [facility] - In-pt ? ?Chief Complaint: ? ?Alcoholic cirrhosis with portal hypertension and refractory ascites s/p TIPS on 5.15.23 with Dr. Maryelizabeth Kaufmann ? ?Subjective: ? ?Patient lying in bed. Lethargic, pale. A-line pressure reading 51/46. Patient state she is at [facility] and knows she has had surgery but it asking what type of  surgery she had.  RN to call hospital ist for further orders for B/P. ? ?Allergies: ?Colchicine ? ?Medications: ?Prior to Admission medications   ?Medication Sig Start Date End Date Taking? Authorizing Provider  ?acetaminophen (TYLENOL) 500 MG tablet Take 1,000 mg by mouth every 6 (six) hours as needed for headache or mild pain.   Yes [provider]  ?alendronate (FOSAMAX) 70 MG tablet Take 70 mg by mouth every Monday.   Yes [provider]  ?allopurinol (ZYLOPRIM) 100 MG tablet Take 200 mg by mouth daily. 01/20/20  Yes [provider]  ?amiodarone (PACERONE) 200 MG tablet Take 200 mg by mouth in the morning and at bedtime.  02/27/18  Yes [provider]  ?Cholecalciferol (VITAMIN D) 50 MCG (2000 UT) tablet Take 2,000 Units by mouth daily. 05/03/21  Yes [provider]  ?iron polysaccharides (NIFEREX) 150 MG capsule Take 1 capsule (150 mg total) by mouth daily. 01/27/22  Yes Kathie Dike, MD  ?lactulose (CHRONULAC) 10 GM/15ML solution Take 15 mLs (10 g total) by mouth 2 (two) times daily as needed for mild constipation (Ensure 2-3 soft bowel movements daily). 03/07/22  Yes [place] Donnamarie Poag, DO  ?levothyroxine (SYNTHROID) 200 MCG tablet Take 200 mcg by mouth every morning. 12/26/21  Yes [provider]  ?midodrine (PROAMATINE) 5 MG tablet Take 3 tablets (15 mg total) by mouth 3 (three) times daily with meals. 03/07/22  Yes [place] Donnamarie Poag, DO  ?Multiple Vitamins-Minerals (PRESERVISION AREDS 2 PO) Take 1 capsule by mouth daily. Preservision AREDS-2 250 mg- 90 mg- 40 mg-1 mg   Yes [provider]  ?polyethylene glycol (MIRALAX / GLYCOLAX) 17 g packet Take 17 g by mouth daily as needed for mild constipation. 01/26/22  Yes Kathie Dike, MD  ?sodium bicarbonate 650 MG tablet Take 1 tablet (650 mg total) by mouth 3 (three) times daily. 03/07/22  Yes [place] Donnamarie Poag, DO  ?pantoprazole (PROTONIX) 40 MG tablet Take 1 tablet (40 mg total) by mouth 2 (two) times daily before a meal. 01/26/22 03/08/22  Kathie Dike, MD  ? ? ? ?Vital Signs: ?BP (!) 86/66 (BP Location: Right Arm)   Pulse 100   Temp 97.8 ?F (36.6 ?C) (Oral)   Resp 17   Ht '5\' 2"'$  (1.575 m)   Wt 172 lb 6.4 oz (78.2 kg)   SpO2 97%   BMI 31.53 kg/m?  ? ?Physical Exam ?Vitals and nursing note reviewed.  ?Constitutional:   ?   Appearance: She is well-developed. She is ill-appearing.  ?HENT:  ?   Head: Normocephalic and atraumatic.  ?Eyes:  ?   Conjunctiva/sclera: Conjunctivae normal.  ?Cardiovascular:  ?   Rate and Rhythm: Regular rhythm. Tachycardia present.  ?   Comments: Right IJ and right groin access sites are soft with no active bleeding and no appreciable pseudoaneurysm. Dressing is C/D/I ? ?Pulmonary:  ?   Effort: Pulmonary effort is normal.  ?Genitourinary: ?   Comments: Foley catheter in place. Urine noted to be in the bag. ?Musculoskeletal:     ?   General: Normal range of motion.  ?   Cervical  back: Normal range of motion.  ?Skin: ?   General: Skin is warm.  ?   Coloration: Skin is pale.  ?Neurological:  ?   Mental Status: She is alert and oriented to person, place, and time.  ? ? ?Imaging: ?IR Tips ? ?Result Date: 03/22/2022 ?CLINICAL DATA:  Briefly, 71 year old female with decompensated EtOH cirrhosis and portal hypertension. Aborted TIPS secondary to challenging anatomy on 02/24/22. Pt presents for re-TIPS. MELD = 21 (02/26/2022). Child Pugh class B, 8 points. EXAM: Procedures: 1. TRANSJUGULAR INTRAHEPATIC PORTOSYSTEMIC SHUNT (TIPS) 2. INTRACARDIAC ECHOCARDIOGRAPHY (ICE) 3. THERAPEUTIC PARACENTESIS Performing Physician: Michaelle Birks, MD Assistant(s): Ruthann Cancer, MD, MD A qualified trainee/resident or advanced practice provider (APP) was not immediately available to assist with this case. MEDICATIONS: As antibiotic prophylaxis, Rocephin 1 gm IV was ordered pre-procedure and administered intravenously within one hour of incision. ANESTHESIA/SEDATION: Sedation by the Anesthesia Team was performed. Please see anesthesiology log for details. CONTRAST:  65 mL Omnipaque 300, intravenous FLUOROSCOPY TIME:  Fluoroscopic dose; 295 mGy PROCEDURE: Informed written consent was obtained from the the patient and/or patient's representative after a thorough discussion of the procedural risks, benefits and alternatives. All questions were addressed. Maximal Sterile Barrier Technique was utilized including caps, mask, sterile gowns, sterile gloves, sterile drape, hand hygiene and skin antiseptic. A timeout was performed prior to the initiation of the procedure. Initial ultrasound scanning demonstrates a large amount of ascites within the RIGHT abdomen. Under direct ultrasound guidance, a 6 Fr Safe-T-Centesis catheter was introduced. An ultrasound image was saved for documentation purposes. Therapeutic paracentesis was performed. A preliminary ultrasound of the RIGHT groin was performed and demonstrates a patent right common femoral vein. A permanent ultrasound image was recorded. Using a combination of fluoroscopy and ultrasound, an access site was determined. A small dermatotomy was made at the planned puncture site. Using ultrasound guidance, access into the RIGHT common femoral vein was obtained with visualization of needle entry into the vessel using a standard micropuncture technique. A wire was advanced into the IVC insert all fascial dilation performed. An 8 [place], 11 cm vascular sheath was placed into the external iliac vein. Through this access site, an 19 [place] ICE catheter was advanced with ease under fluoroscopic guidance to the  level of the intrahepatic inferior vena cava. A preliminary ultrasound of the RIGHT neck was performed and demonstrates a patent internal jugular vein. A permanent ultrasound image was recorded. Using a combination of fluoroscopy and ultrasound, an access site was determined. A small dermatotomy was made at the planned puncture site. Using ultrasound guidance, access into the RIGHT internal jugular vein was obtained with visualization of needle entry into the vessel using a standard micropuncture technique. A wire was advanced into the IVC and serial fascial dilation performed. A 10 Fr Cook TIPS sheath was placed into the internal jugular vein and advanced to the IVC. The jugular sheath was retracted into the right atrium and manometry was performed. A 5 French angled tip catheter was then directed into the LEFT hepatic vein. Hepatic venogram was performed. These images demonstrated patent hepatic vein with no stenosis. The catheter was advanced to a wedge portion of the a patent vein over which the 10 Fr sheath was advanced into the distal LEFT hepatic vein. Using ICE ultrasound visualization the catheter as LEFT hepatic vein as well as the portal anatomy was defined. A planned exit site from the hepatic vein and puncture site from the portal vein was placed into a single sonographic plane. Under  direct ultrasound visualization, the Arg[place] Scorpion X needle was advanced into the central RIGHT portal vein. Hand injection of contrast confirmed position within the portal system. A Glidewire was then advanced into the superior mesenteric vein. A 5 Fr marking pigtail catheter was then advanced over the wire into the main portal vein and wire removed. Portal venogram was performed which demonstrated a patent portal vein. Prominent coronary vein with gastroesophageal varices were seen arising from the main portal vein. Portal manometry was then performed. The tract was then dilated to 8 mm with an 8 mm x 4 mm Athletis  balloon. A 8-10 mm by 8 + 2 mm of GORE Viatorr TIPS stent was placed. After placement of the shunt, RIGHT atrial and portal pressures were repeated. Completion portal venogram demonstrates a patent TIPS endograft withou

## 2022-03-13 NOTE — Progress Notes (Signed)
Pharmacy Antibiotic Note ? ?Diane Pruitt is a 71 y.o. female admitted on 03/22/2022 with sepsis/bacteremia.  Pharmacy has been consulted for Vancomycin and Cefepime dosing. Noted pt also on po Vanc for cdiff. ? ?Pt with CKD stage III (SCr 2.3-2.9 during last admission), worsening renal function. Scr up to 3.37. ? ?Plan: ?Cefepime 2gm IV q24h ?Vancomycin 1500 mg IV now ?Consider vanc level in ~48 hrs  ?Will f/u renal function, micro data, and pt's clinical condition ? ? ?Height: '5\' 2"'$  (157.5 cm) ?Weight: 78.2 kg (172 lb 6.4 oz) ?IBW/kg (Calculated) : 50.1 ? ?Temp (24hrs), Avg:97.7 ?F (36.5 ?C), Min:97.5 ?F (36.4 ?C), Max:97.8 ?F (36.6 ?C) ? ?Recent Labs  ?Lab 03/07/22 ?0148 03/24/2022 ?1038 03/13/22 ?4259 03/13/22 ?5638 03/13/22 ?1303  ?WBC 24.4* 16.2* 19.9*  --  19.2*  ?CREATININE 2.95* 3.61* 3.37*  --   --   ?LATICACIDVEN  --   --   --  1.9  --   ?  ?Estimated Creatinine Clearance: 15 mL/min (A) (by C-G formula based on SCr of 3.37 mg/dL (H)).   ? ?Allergies  ?Allergen Reactions  ? Colchicine Other (See Comments)  ?  Stomach upset, diarrhea ?Other reaction(s): stomach upset  ? ? ?Antimicrobials this admission: ?5/16 Vanc >>  ?5/16 Cefepime >>  ?5/16 po Vanc>> ? ?Microbiology results: ?5/16 BCx:  ?516 MRSA PCR: negative ? ?Thank you for allowing pharmacy to be a part of this patient?s care. ? ?Sherlon Handing, PharmD, BCPS ?Please see amion for complete clinical pharmacist phone list ?03/13/2022 9:39 PM ? ?

## 2022-03-13 NOTE — TOC Progression Note (Addendum)
Transition of Care (TOC) - Initial/Assessment Note  ? ? ?Patient Details  ?Name: Diane Pruitt ?MRN: [id number] ?Date of Birth: 13-Nov-1950 ? ?Transition of Care (TOC) CM/SW Contact:    ?Paulene Floor Elisha Cooksey, LCSWA ?Phone Number: ?03/13/2022, 4:19 PM ? ?Clinical Narrative:                 ?CSW received consult for SNF placement and contacted [facility].  The patient is a long term resident at the facility and can return when medically ready.   ? ?TOC following for d/c planning needs.   ?  ? ? ?Patient Goals and CMS Choice ?  ?  ?  ? ?Expected Discharge Plan and Services ?  ?  ?  ?  ?  ?                ?  ?  ?  ?  ?  ?  ?  ?  ?  ?  ? ?Prior Living Arrangements/Services ?  ?  ?  ?       ?  ?  ?  ?  ? ?Activities of Daily Living ?Home Assistive Devices/Equipment: Wheelchair, Environmental consultant (specify type), Cane (specify quad or straight), Grab bars in shower, Raised toilet seat with rails, Shower chair without back, Shower chair with back ?ADL Screening (condition at time of admission) ?Patient's cognitive ability adequate to safely complete daily activities?: Yes ?Is the patient deaf or have difficulty hearing?: No ?Does the patient have difficulty seeing, even when wearing glasses/contacts?: No ?Does the patient have difficulty concentrating, remembering, or making decisions?: No ?Patient able to express need for assistance with ADLs?: Yes ?Does the patient have difficulty dressing or bathing?: Yes ?Independently performs ADLs?: No ?Does the patient have difficulty walking or climbing stairs?: Yes ?Weakness of Legs: Both ?Weakness of Arms/Hands: Both ? ?Permission Sought/Granted ?  ?  ?   ?   ?   ?   ? ?Emotional Assessment ?  ?  ?  ?  ?  ?  ? ?Admission diagnosis:  Hepatorenal syndrome ([place]) [K76.7] ?Patient Active Problem List  ? Diagnosis Date Noted  ? Malnutrition of moderate degree 03/13/2022  ? Hepatorenal syndrome ([place]) 03/10/2022  ? DNR (do not resuscitate) 03/27/2022  ? Unspecified protein-calorie malnutrition ([place])  03/08/2022  ? Hyperkalemia 02/22/2022  ? Abnormal urinalysis 02/22/2022  ? Portal vein thrombosis 02/22/2022  ? Ascites due to alcoholic cirrhosis (HCC)   ? Clostridium difficile infection 02/21/2022  ? 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 Hypotension 01/29/2022  ? Hyponatremia 01/29/2022  ? Weakness 01/29/2022  ? Portal hypertensive gastropathy ([place]) 01/25/2022  ? Hyperlipidemia 01/21/2022  ? Hypokalemia 01/06/2022  ? Acute blood loss anemia 01/04/2022  ? 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 AKI (acute kidney injury) ([place]) 01/04/2022  ? Elevated LFTs 01/04/2022  ? Gout 01/04/2022  ? Depression 01/04/2022  ? GI bleed 01/04/2022  ? Iron deficiency anemia due to chronic blood loss 01/03/2022  ? Unspecified abnormal finding in specimens from other organs, systems and tissues 11/20/2021  ? Age-related osteoporosis without current pathological fracture 11/01/2021  ? Chronic gouty arthritis 11/01/2021  ? Excessive sleepiness 11/01/2021  ? History of embolic stroke 67/61/9509  ? History of tobacco use 11/01/2021  ? Hypercoagulable state ([place]) 11/01/2021  ? Iron deficiency anemia 11/01/2021  ? Obesity due to excess calories 11/01/2021  ? Personal history of colonic polyps 11/01/2021  ? Polyp of colon 11/01/2021  ? Severe major  depression, single episode, without psychotic features ([place]) 11/01/2021  ? Acquired hypothyroidism 02/12/2020  ? Compression fracture of L2 ([place]) 02/01/2020  ? Low back pain 01/18/2020  ? Pain of right hip joint 01/18/2020  ? Paroxysmal atrial fibrillation ([place]) 01/25/2018  ? Cyst of finger 01/23/2018  ? ?PCP:  [name], MD ?Pharmacy:   ?[id number] [address]?[place]?Phone: [phone]: [phone]? ?[facility]?Phone: [phone]: [phone]? ?[facility] [place] [address] ?[address] ?[place]?Phone: [phone]: [phone]? ? ? ? ?Social Determinants of Health (SDOH) Interventions ?  ? ?Readmission Risk Interventions ?   ? View : No data to display.  ?  ?  ?  ? ? ? ?

## 2022-03-13 NOTE — Progress Notes (Addendum)
? ?NAME:  Diane Pruitt, MRN:  [id number], DOB:  01-Jun-1951, LOS: 1 ?ADMISSION DATE:  03/28/2022 CONSULTATION DATE:  03/25/2022 ?REFERRING MD:  Lorin [facility] CHIEF COMPLAINT:  Hypotension s/p TIPS  ? ?History of Present Illness:  ?71 year old woman who presented to [facility] 02/28/2022 for planned TIPS insertion. PMHx significant for HTN, Afib (not on [facility]), ischemic stroke, CKD stage IIIa, anxiety/depression, EtOH cirrhosis with portal hypertension as evidenced by PHG and ascites (requiring frequent paracentesis, last 5/8). Recently admitted to [facility] 4/25 for hepatorenal syndrome and found to be C. Diff positive with concern for SBP (s/p Cipro course), TIPS was attempted 4/29 but aborted in the setting of challenging portal vein anatomy. Discharged to SNF 5/10. ? ?Underwent TIPS with IR 5/15 (Dr. Maryelizabeth Kaufmann). Successful procedure with reduction in gradient from 15 to 8. Additionally, therapeutic paracentesis (2.2L) was completed. Post-procedure, patient was noted to be hypotensive with SBPs 70s-80s. ? ?PCCM consulted for hypotension/possible ICU vs. progressive floor admission. ? ?Pertinent Medical History:  ? ?Past Medical History:  ?Diagnosis Date  ? Anemia   ? Anxiety   ? Atrial fibrillation ([place])   ? Barrett esophagus   ? Chronic kidney disease (CKD), stage III (moderate) (HCC)   ? Cirrhosis ([place])   ? Depression   ? Dysrhythmia   ? Atrial Fibrillation 2 years ago  ? Hypertension   ? Ischemic stroke ([place]) 01/25/2018  ? ?Significant Hospital Events: ?Including procedures, antibiotic start and stop dates in addition to other pertinent events   ?4/25 Admitted to [facility] for [place], C. Diff positive, c/f SBP (s/p Cipro) ?4/29 TIPS attempted, aborted due to challenging portal vein anatomy ?5/10 Discharged to SNF ?5/15 Admitted to [facility] for TIPS placement with IR. Underwent TIPS (RIJ, R fem access). Gradient reduced from 15 to 8. Therapeutic para, 2.2L removed. Hypotensive post-procedure. PCCM consulted. ?5/16 pt went to the floor last  night, re-consulted this AM for persistent hypotension despite  ? ?Interim History / Subjective:  ?Received 1.5L IVF today, MAP still drifting down to 40's, pt awake and states "feels bad" ? ?Objective:  ?Blood pressure (!) 76/34, pulse 84, temperature 97.8 ?F (36.6 ?C), temperature source Oral, resp. rate 17, height '5\' 2"'  (1.575 m), weight 78.2 kg, SpO2 95 %. ?   ?   ? ?Intake/Output Summary (Last 24 hours) at 03/13/2022 1313 ?Last data filed at 03/13/2022 0600 ?Gross per 24 hour  ?Intake 1885.61 ml  ?Output 850 ml  ?Net 1035.61 ml  ? ? ?Filed Weights  ? 03/23/2022 1008 03/06/2022 2055  ?Weight: 75.3 kg 78.2 kg  ? ? ? ? ?General:  pale, chronically ill-appearing F, awake and in no distress ?HEENT: MM pink/dry, sclera anicteric ?Neuro: awake, fatigued, oriented x3, no asterixis  ?CV: s1s2 rrr, no m/r/g ?PULM:  clear bilaterally on RA, no distress ?GI: soft, non-tender ?Extremities: warm/dry, 1+ pre-tibial edema  ?Skin: no rashes or lesions ? ?Labs reviewed ?Creatinine 3.3 ?Bicarb 15 ?AST 433 ?ALT 106 ?Bili 3.3 ?WBC 19.9 ? ?Resolved Hospital Problem List:  ? ? ?Assessment & Plan:  ? ? ?Persistent Peri-procedural hypotension post-TIPS placement 5/15 ?Underlying EtOH cirrhosis with portal hypertension ?Ascites s/p therapeutic paracentesis ?On Midodrine 42m tid, given Albumin 140.9W5%  ?-systolics in the 711'B baseline 80's  ?-awake and alert though generally feeling unwell ?-transfer to ICU, initiate peripheral levophed ?-finishing last 500cc bolus, monitor UOP, suspect was volume down ?-may need CVC ?-blood cultures drawn, check pro-calcitonin and lactic acid ? ? ? ?Hepatorenal Syndrome  ?MELD score 30, worsening renal failure  ?-  continue to hold diuretics ?-making urine ?-on Cipro for SBP prophylaxis ?-continue oral bicarb ? ? ?C. Diff colitis  ?-completed 10 day course oral vanc, ID consulted by hospitalists ?-continue enteric precautions ? ? ?Hypothyroidism ?-continue Levothyroxine ? ? ?Paroxysmal Atrial Fibrillation   ?-not anti-coagulated, continue amiodarone ? ?Best Practice: (right click and "Reselect all SmartList Selections" daily)  ? ? ?Diet/type: regula ?DVT prophylaxis: SCD ?GI prophylaxis: n/a ?Lines: n/a ?Foley:  n/a ?Code Status:  DNR ?Last date of multidisciplinary goals of care discussion: 5/16 met with palliative care, also updated daughter at the bedside, confirms DNR, is not sure her mother will want much more intervention beyond a central line ? ?Labs:  ?CBC: ?Recent Labs  ?Lab 03/07/22 ?0148 03/13/2022 ?1038 03/13/22 ?0648  ?WBC 24.4* 16.2* 19.9*  ?NEUTROABS 22.0*  --  17.9*  ?HGB 9.3* 11.1* 9.0*  ?HCT 27.4* 32.3* 25.9*  ?MCV 80.6 79.0* 79.2*  ?PLT 160 212 190  ? ? ?Basic Metabolic Panel: ?Recent Labs  ?Lab 03/07/22 ?0148 03/19/2022 ?1038 03/13/22 ?0648  ?NA 132* 128* 131*  ?K 4.0 4.2 3.8  ?CL 103 97* 101  ?CO2 17* 14* 15*  ?GLUCOSE 112* 86 74  ?BUN 87* 117* 109*  ?CREATININE 2.95* 3.61* 3.37*  ?CALCIUM 8.3* 8.5* 8.2*  ? ? ?GFR: ?Estimated Creatinine Clearance: 15 mL/min (A) (by C-G formula based on SCr of 3.37 mg/dL (H)). ?Recent Labs  ?Lab 03/07/22 ?0148 03/17/2022 ?1038 03/13/22 ?0648  ?WBC 24.4* 16.2* 19.9*  ? ? ?Liver Function Tests: ?Recent Labs  ?Lab 03/07/22 ?0148 03/13/22 ?0102  ?AST 177* 433*  ?ALT 58* 106*  ?ALKPHOS 138* 150*  ?BILITOT 1.5* 3.3*  ?PROT 5.8* 5.8*  ?ALBUMIN 2.8* 2.7*  ? ? ?No results for input(s): LIPASE, AMYLASE in the last 168 hours. ?No results for input(s): AMMONIA in the last 168 hours. ? ?ABG: ?   ?Component Value Date/Time  ? PHART 7.370 02/24/2022 1537  ? PCO2ART 31.8 (L) 02/24/2022 1537  ? PO2ART 61 (L) 02/24/2022 1537  ? HCO3 18.3 (L) 02/24/2022 1537  ? TCO2 19 (L) 02/24/2022 1537  ? ACIDBASEDEF 6.0 (H) 02/24/2022 1537  ? O2SAT 91 02/24/2022 1537  ? ?Coagulation Profile: ?Recent Labs  ?Lab 03/11/2022 ?1038  ?INR 1.6*  ? ? ?Cardiac Enzymes: ?No results for input(s): CKTOTAL, CKMB, CKMBINDEX, TROPONINI in the last 168 hours. ? ?HbA1C: ?Hgb A1c MFr Bld  ?Date/Time Value Ref Range Status   ?01/22/2022 12:58 AM 4.5 (L) 4.8 - 5.6 % Final  ?  Comment:  ?  (NOTE) ?Pre diabetes:          5.7%-6.4% ? ?Diabetes:              >6.4% ? ?Glycemic control for   <7.0% ?adults with diabetes ?  ?01/26/2018 03:12 AM 5.0 4.8 - 5.6 % Final  ?  Comment:  ?  (NOTE) ?Pre diabetes:          5.7%-6.4% ?Diabetes:              >6.4% ?Glycemic control for   <7.0% ?adults with diabetes ?  ? ?CBG: ?No results for input(s): GLUCAP in the last 168 hours. ? ?Review of Systems:   ?Review of systems completed with pertinent positives/negatives outlined in above HPI. ? ?Past Medical History:  ?She,  has a past medical history of Anemia, Anxiety, Atrial fibrillation ([place]), Barrett esophagus, Chronic kidney disease (CKD), stage III (moderate) ([place]), Cirrhosis ([place]), Depression, Dysrhythmia, Hypertension, and Ischemic stroke ([place]) (01/25/2018).  ? ?Surgical History:  ? ?  Past Surgical History:  ?Procedure Laterality Date  ? CESAREAN SECTION    ? ESOPHAGOGASTRODUODENOSCOPY (EGD) WITH PROPOFOL N/A 01/05/2022  ? Procedure: ESOPHAGOGASTRODUODENOSCOPY (EGD) WITH PROPOFOL;  Surgeon: Ronnette Juniper, MD;  Location: [facility];  Service: Gastroenterology;  Laterality: N/A;  ? EYE SURGERY    ? INTRAOCULAR LENS INSERTION Bilateral About 3 years ago  ? IR IVUS EACH ADDITIONAL NON CORONARY VESSEL  03/26/2022  ? IR PARACENTESIS  02/12/2022  ? IR PARACENTESIS  02/24/2022  ? IR PARACENTESIS  03/05/2022  ? IR PARACENTESIS  02/27/2022  ? IR TIPS  02/24/2022  ? IR TIPS  03/10/2022  ? IR US GUIDE VASC ACCESS RIGHT  02/24/2022  ? IR US GUIDE VASC ACCESS RIGHT  03/04/2022  ? KYPHOPLASTY N/A 02/18/2020  ? Procedure: KYPHOPLASTY L2;  Surgeon: Melina Schools, MD;  Location: [place];  Service: Orthopedics;  Laterality: N/A;  60 mins  ? RADIOLOGY WITH ANESTHESIA N/A 02/24/2022  ? Procedure: TIPS;  Surgeon: Suzette Battiest, MD;  Location: [place];  Service: Radiology;  Laterality: N/A;  ? RADIOLOGY WITH ANESTHESIA N/A 02/28/2022  ? Procedure: TIPS;  Surgeon: Michaelle Birks, MD;   Location: [place];  Service: Radiology;  Laterality: N/A;  ? TONSILLECTOMY    ? TUBAL LIGATION    ? ?Social History:  ? reports that she has never smoked. She has never used smokeless tobacco. She reports that sh

## 2022-03-13 NOTE — Evaluation (Signed)
Occupational Therapy Evaluation ?Patient Details ?Name: Diane Pruitt ?MRN: [id number] ?DOB: 1951-03-09 ?Today's Date: 03/13/2022 ? ? ?History of Present Illness Pt is a 71 y.o. female admitted 03/26/2022 for planned TIPS insertion and paracentesis. Course complicated by post-procedure hypotension.   Of note, multiple recent admissions in 01/2022 for TIPS, hypertensive gastropathy, upper GIB with d/c to SNF rehab. Other PMH includes depression, afib, ischemic stroke, CKD III, alcoholic liver cirrhosis.  ? ?Clinical Impression ?  ?Pt admitted with the above diagnosis and has the deficits listed below. Pt would benefit from cont OT to increase independence with basic adls and adl transfers to attempt to get pt back to a level that she can d/c home after rehab. Pt lives alone and has not been independent at home since 11/2021.  Pt is very weak and will not be able to return home without further rehab. Will continue to see acutely with focus on getting pt out of bed.  ?  ?   ? ?Recommendations for follow up therapy are one component of a multi-disciplinary discharge planning process, led by the attending physician.  Recommendations may be updated based on patient status, additional functional criteria and insurance authorization.  ? ?Follow Up Recommendations ? Skilled nursing-short term rehab (<3 hours/day)  ?  ?Assistance Recommended at Discharge Frequent or constant Supervision/Assistance  ?Patient can return home with the following A lot of help with walking and/or transfers;A lot of help with bathing/dressing/bathroom;Assistance with cooking/housework;Assist for transportation;Help with stairs or ramp for entrance ? ?  ?Functional Status Assessment ? Patient has had a recent decline in their functional status and demonstrates the ability to make significant improvements in function in a reasonable and predictable amount of time.  ?Equipment Recommendations ? Other (comment) (tbd)  ?  ?Recommendations for Other Services    ? ? ?  ?Precautions / Restrictions Precautions ?Precautions: Fall ?Precaution Comments: monitor BP. Pt tends to run low normally but running very low on eval ?Restrictions ?Weight Bearing Restrictions: No  ? ?  ? ?Mobility Bed Mobility ?Overal bed mobility: Needs Assistance ?Bed Mobility: Rolling, Sidelying to Sit ?Rolling: Min guard ?Sidelying to sit: Mod assist, HOB elevated ?  ?Sit to supine: Mod assist, HOB elevated ?  ?General bed mobility comments: assist to come to uprigth position. Pt not feeling well due to low BP so eval limited. ?  ? ?Transfers ?  ?  ?  ?  ?  ?  ?  ?  ?  ?General transfer comment: unable to attempt standing today due to BP ?  ? ?  ?Balance Overall balance assessment: Needs assistance ?Sitting-balance support: Feet supported, No upper extremity supported ?Sitting balance-Leahy Scale: Fair ?Sitting balance - Comments: Prefers back support ?Postural control: Posterior lean ?  ?  ?Standing balance comment: unable to stand ?  ?  ?  ?  ?  ?  ?  ?  ?  ?  ?  ?   ? ?ADL either performed or assessed with clinical judgement  ? ?ADL Overall ADL's : Needs assistance/impaired ?Eating/Feeding: Set up;Sitting ?  ?Grooming: Set up;Sitting ?  ?Upper Body Bathing: Moderate assistance;Sitting ?  ?Lower Body Bathing: Maximal assistance;Bed level ?  ?Upper Body Dressing : Moderate assistance;Sitting ?  ?Lower Body Dressing: Maximal assistance;Bed level ?  ?  ?  ?Toileting- Clothing Manipulation and Hygiene: Total assistance;Bed level ?  ?  ?  ?Functional mobility during ADLs: Maximal assistance ?General ADL Comments: Pt very limited with eval today due to low BP. Pt with  arterial line BP 85/39.  BP runs low with this pt and pt wanted to attempt EOB. Sat EOB for appx 30 seconds and then returned to supine. Unable to attempt more mobilty due to BP.  ? ? ? ?Vision Baseline Vision/History: 1 Wears glasses ?Ability to See in Adequate Light: 0 Adequate ?Patient Visual Report: No change from baseline ?Vision  Assessment?: No apparent visual deficits  ?   ?Perception   ?  ?Praxis   ?  ? ?Pertinent Vitals/Pain Pain Assessment ?Pain Assessment: Faces ?Faces Pain Scale: Hurts a little bit ?Pain Location: abdomen ?Pain Descriptors / Indicators: Grimacing ?Pain Intervention(s): Limited activity within patient's tolerance, Monitored during session, Repositioned  ? ? ? ?Hand Dominance Left ?  ?Extremity/Trunk Assessment Upper Extremity Assessment ?Upper Extremity Assessment: LUE deficits/detail ?LUE Deficits / Details: slightly weaker than R but functional. Difficult to fully assess due to IV block. ?LUE Sensation: WNL ?LUE Coordination: WNL ?  ?Lower Extremity Assessment ?Lower Extremity Assessment: Defer to PT evaluation ?  ?Cervical / Trunk Assessment ?Cervical / Trunk Assessment: Kyphotic ?  ?Communication Communication ?Communication: No difficulties ?  ?Cognition Arousal/Alertness: Lethargic ?Behavior During Therapy: Flat affect, Anxious ?Overall Cognitive Status: Within Functional Limits for tasks assessed ?  ?  ?  ?  ?  ?  ?  ?  ?  ?  ?  ?  ?  ?  ?  ?  ?General Comments: Pt very weak after TIPs procedure yesterday with low BP. Unable to mobilize past EOB. ?  ?  ?General Comments  Pt has good strength in arms and legs but post procedure has very low BP even though BP normally low.  Pt unable to tolerate EOB or standing at this time but forsee pt will mobilize well when feeling better ? ?  ?Exercises   ?  ?Shoulder Instructions    ? ? ?Home Living Family/patient expects to be discharged to:: Skilled nursing facility ?Living Arrangements: Alone ?  ?  ?  ?  ?  ?  ?  ?  ?  ?  ?  ?  ?  ?  ?  ?Additional Comments: pt last home in feb, was previously independent ?  ? ?  ?Prior Functioning/Environment Prior Level of Function : Needs assist ?  ?  ?  ?  ?  ?  ?Mobility Comments: was using a RW initally at rehab, progressing towards using WC due to weakness with assist for stand pivot transfers ?ADLs Comments: staff assisting with  ADLs bed level, transfers with assist to commode. Pt states she go blood transfusion when at SNF and felt much better and was doing more but only for a couple of days ?  ? ?  ?  ?OT Problem List: Decreased strength;Decreased activity tolerance;Impaired balance (sitting and/or standing);Decreased safety awareness;Decreased knowledge of use of DME or AE;Decreased knowledge of precautions;Cardiopulmonary status limiting activity ?  ?   ?OT Treatment/Interventions: Self-care/ADL training;Therapeutic exercise;DME and/or AE instruction;Therapeutic activities;Patient/family education;Balance training  ?  ?OT Goals(Current goals can be found in the care plan section) Acute Rehab OT Goals ?Patient Stated Goal: to feel better ?OT Goal Formulation: With patient ?Time For Goal Achievement: 03/27/22 ?Potential to Achieve Goals: Fair ?ADL Goals ?Pt Will Perform Grooming: with min guard assist;standing ?Pt Will Perform Lower Body Bathing: with min assist;sit to/from stand ?Pt Will Perform Lower Body Dressing: with min assist;sit to/from stand ?Pt Will Transfer to Toilet: with min assist;ambulating ?Pt Will Perform Toileting - Clothing Manipulation and hygiene: with min guard  assist;sit to/from stand  ?OT Frequency: Min 2X/week ?  ? ?Co-evaluation   ?  ?  ?  ?  ? ?  ?AM-PAC OT "6 Clicks" Daily Activity     ?Outcome Measure Help from another person eating meals?: None ?Help from another person taking care of personal grooming?: None ?Help from another person toileting, which includes using toliet, bedpan, or urinal?: A Lot ?Help from another person bathing (including washing, rinsing, drying)?: A Lot ?Help from another person to put on and taking off regular upper body clothing?: A Lot ?Help from another person to put on and taking off regular lower body clothing?: A Lot ?6 Click Score: 16 ?  ?End of Session Nurse Communication: Mobility status ? ?Activity Tolerance: Patient limited by fatigue ?Patient left: in bed;with call  bell/phone within reach;with bed alarm set ? ?OT Visit Diagnosis: Other abnormalities of gait and mobility (R26.89);Muscle weakness (generalized) (M62.81);Dizziness and giddiness (R42);History of falling (Z91.81)

## 2022-03-13 NOTE — Progress Notes (Signed)
PROGRESS NOTE ? ?Brief Narrative: ?Diane Pruitt is a 71 y.o. female with a history of alcoholic cirrhosis with portal HTN, history of HRS, recent C. diff colitis who presented to the ED 5/15 for repeat attempt at TIPS. Post-procedurally was admitted to hospitalist service with CCM consulting. Has arterial line in place and continues to have significant hypotension despite volume resuscitation. PCCM reconsulted and will assume care of the patient, initiate pressors.  ? ?Subjective: ?Feels tired, no specific complaints. States she's not had a BM since arrival. ? ?Objective: ?BP (!) 70/41   Pulse 84   Temp 97.8 ?F (36.6 ?C) (Oral)   Resp (!) 21   Ht '5\' 2"'$  (1.575 m)   Wt 78.2 kg   SpO2 95%   BMI 31.53 kg/m?   ?Gen: Chronically ill-appearing female in no distress ?Dry mucous membranes ?Pulm: Clear and nonlabored.  ?CV: Regular borderline tachycardia, no murmur, +LE edema ?GI: Soft, flaccid, nontender, nondistended. ?Neuro: Drowsy but rousable and oriented to hospital and reason for hospitalization being "I drink too much." No asterixis. No focal deficits. ?Skin: Right IJ site without hematoma. Pale. Left radial arterial line appears normal. ? ?Assessment & Plan: ?Alcoholic cirrhosis with portal HTN, refractory ascites, history of HRS: Now s/p TIPS 5/15 with persistent hypotension (acute on chronic).  ?- Arterial BP confirming persistent significant hypotension despite midodrine. This morning I added albumin and increased IV fluids without significant improvement. CBC not suggestive of occult bleeding and exam also not consistent with procedural complication.  ?- Reconsulted PCCM who will initiate peripherally infused levophed and take onto their service. Appreciate their assistance.  ? ?Patrecia Pour, MD ?Pager on amion ?03/13/2022, 3:05 PM   ?

## 2022-03-13 NOTE — Progress Notes (Addendum)
PT Cancellation Note ? ?Patient Details ?Name: Diane Pruitt ?MRN: [id number] ?DOB: [birth year] ? ? ?Cancelled Treatment:    Reason Eval/Treat Not Completed: Medical issues which prohibited therapy - noted hypotension sitting EOB during OT Evaluation limiting activity progression OOB (arterial line reading BP 85/39). Will follow-up for PT Evaluation later today as schedule permits. ? ?1:56p - update: patient with persistent hypotension, preparing for transfer to ICU. Will follow-up for PT Evaluation tomorrow as appropriate. ? ?Mabeline Caras, PT, DPT ?Acute Rehabilitation Services  ?Pager [phone] ?Office [phone] ? ?Derry Lory ?03/13/2022, 10:07 AM ?

## 2022-03-13 NOTE — Consult Note (Signed)
?   ? ? ? ? ?Diane Pruitt for Infectious Disease   ? ?Date of Admission:  03/07/2022    ? ?Reason for Consult: sepsis; hx cdiff    ?Referring Provider: Bonner Puna ? ? ? ?Abx: ?none      ? ? ?Assessment: ?71 yo female esrd admitted 5/15 for elective tips insertion, asked by primary team for diarrhea and leukocytosis evaluation in setting of recent cdiff infection ? ?She reports 3-4 loose stool prior to admission with resolution of diarrhea. She doesn't take lactulose.  ? ?However she hasn't had stool here and no sign of toxic megacolon or any abd discomfort ? ?She is relatively hypotensive post tips procedure and there is a couple days progressive leukocytosis. No other specific localizing sign of infection otherwise.  ? ?At this time I would hold cdiff treatment; consider broad sepsis w/u and if decompensating would add empiric bsABX with secondary cdiff prophylaxis ? ?She also has recent hrs dx and I query if this is a continuation of that. Her kidney function appears to be worse ? ? ?Plan: ?I have ordered blood cultures ?Agree with discussing with IR for potential complication of tips including bleeding leading to hemorrhagic hypotension ?If continues to decline with hemodynamics and or fever, would start empiric bsAbx and bid prophylactic cdiff coverage ?If significant diarrhea/abdominal discomfort would start po vancomycin for dedicated cdiff treatment ?Discussed with primary team ? ?I spent 80 minute reviewing data/chart, and coordinating care and >50% direct face to face time providing counseling/discussing diagnostics/treatment plan with patient ? ? ? ?------------------------------------------------ ?Principal Problem: ?  Clostridium difficile infection ?Active Problems: ?  Paroxysmal atrial fibrillation ([place]) ?  Acquired hypothyroidism ?  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  Portal hypertensive gastropathy (HCC) ?  Hypotension ?  Orthostatic hypotension dysautonomic syndrome ?   Hepatorenal syndrome ([place]) ?  DNR (do not resuscitate) ?  Malnutrition of moderate degree ? ? ? ?HPI: Diane Pruitt is a 71 y.o. female pAfib not on anticoag, hx cva, ckd3, alcoholic cirrhosis, recent cdiff, admitted for elective tips, concerning leukocytosis and loose stool  ? ?Discussed with patient/staff and reviewed chart ? ?She had recent admission 4/25 for hrs and also had cdiff infection at that time given a short course of vancomycin ?She had 3-4 diarrheal episodes per day at that time. She said this resolve but then started to have loose stool 3-4 times a day again. She doesn't remember how long  ? ?She had paracentesis 4/25, 29, and 5/08 and was started on cipro for ?sbp prophylaxis. She came from snf for elective tips ? ?She reports malaise/weakness/decreased appetite for days ?She denies fever/chill ? ?She has leukocytosis here 16 on admission and after tips 18 ?She also has persistent hypotension starting today after tips ? ?Nursing staff said she hasn't had bm since admission ?She denies n/v/abd pain ?She denies chest pain/cough/sob ?She denies dysuria/urgency/frequency, rash, headache ? ?She has been getting iv fluid bolus and albumin but bp is not improving ? ? ? ?Family History  ?Problem Relation Age of Onset  ? Hypertension Mother   ? Hyperlipidemia Mother   ? Atrial fibrillation Mother   ? Hyperlipidemia Father   ? Atrial fibrillation Father   ? ? ?Social History  ? ?Tobacco Use  ? Smoking status: Never  ? Smokeless tobacco: Never  ?Vaping Use  ? Vaping Use: Never used  ?Substance Use Topics  ? Alcohol use: Not Currently  ?  Alcohol/week: 42.0 standard drinks  ?  Types: 42 Shots of liquor per week  ?  Comment: 6 vodkas per day  ? Drug use: Not Currently  ? ? ?Allergies  ?Allergen Reactions  ? Colchicine Other (See Comments)  ?  Stomach upset, diarrhea ?Other reaction(s): stomach upset  ? ? ?Review of Systems: ?ROS ?All Other ROS was negative, except mentioned above ? ? ?Past Medical History:   ?Diagnosis Date  ? Anemia   ? Anxiety   ? Atrial fibrillation ([place])   ? Barrett esophagus   ? Chronic kidney disease (CKD), stage III (moderate) (HCC)   ? Cirrhosis ([place])   ? Depression   ? Dysrhythmia   ? Atrial Fibrillation 2 years ago  ? Hypertension   ? Ischemic stroke ([place]) 01/25/2018  ? ? ? ? ? ?Scheduled Meds: ? allopurinol  200 mg Oral Daily  ? amiodarone  200 mg Oral BID  ? feeding supplement  237 mL Oral BID BM  ? iron polysaccharides  150 mg Oral Daily  ? lactulose  20 g Oral TID  ? levothyroxine  200 mcg Oral q morning  ? midodrine  15 mg Oral Q6H  ? multivitamin with minerals  1 tablet Oral Daily  ? nicotine  14 mg Transdermal Daily  ? sodium bicarbonate  1,300 mg Oral TID  ? sodium chloride flush  3 mL Intravenous Q12H  ? ?Continuous Infusions: ? sodium chloride 100 mL/hr at 03/13/22 1505  ? sodium chloride    ? albumin human    ? lactated ringers    ? norepinephrine (LEVOPHED) Adult infusion 2 mcg/min (03/13/22 1553)  ? ?PRN Meds:.acetaminophen **OR** acetaminophen, hydrALAZINE, ondansetron **OR** ondansetron (ZOFRAN) IV, oxyCODONE ? ? ?OBJECTIVE: ?Blood pressure (!) 70/41, pulse 84, temperature 97.8 ?F (36.6 ?C), temperature source Oral, resp. rate (!) 21, height '5\' 2"'$  (1.575 m), weight 78.2 kg, SpO2 95 %. ? ?Physical Exam ? ?General/constitutional: ill appearing; conversant; lethargic; weak voice ?HEENT: Normocephalic, PER, Conj Clear, EOMI, Oropharynx and mucosa dry; chapped lips ?Neck supple ?CV: rrr no mrg ?Lungs: clear to auscultation, normal respiratory effort ?Abd: Soft, Nontender ?Ext: trace peripheral edema ?Skin: No Rash ?Neuro: asterixis present; generalized weakness ?MSK: no peripheral joint swelling/tenderness/warmth ? ? ? ? ?Lab Results ?Lab Results  ?Component Value Date  ? WBC 19.2 (H) 03/13/2022  ? HGB 9.2 (L) 03/13/2022  ? HCT 27.5 (L) 03/13/2022  ? MCV 79.7 (L) 03/13/2022  ? PLT 207 03/13/2022  ?  ?Lab Results  ?Component Value Date  ? CREATININE 3.37 (H) 03/13/2022  ? BUN 109 (H)  03/13/2022  ? NA 131 (L) 03/13/2022  ? K 3.8 03/13/2022  ? CL 101 03/13/2022  ? CO2 15 (L) 03/13/2022  ?  ?Lab Results  ?Component Value Date  ? ALT 106 (H) 03/13/2022  ? AST 433 (H) 03/13/2022  ? ALKPHOS 150 (H) 03/13/2022  ? BILITOT 3.3 (H) 03/13/2022  ?  ? ? ?Microbiology: ?Recent Results (from the past 240 hour(s))  ?Culture, body fluid w Gram Stain-bottle     Status: None  ? Collection Time: 03/05/22 10:29 AM  ? Specimen: Peritoneal Washings  ?Result Value Ref Range Status  ? Specimen Description PERITONEAL FLUID  Final  ? Special Requests ABDOMEN  Final  ? Culture   Final  ?  NO GROWTH 5 DAYS ?Performed at [facility], [place] [address] [place], [place] [address] ?  ? Report Status 03/10/2022 FINAL  Final  ?Gram stain     Status: None  ? Collection Time: 03/05/22 10:29  AM  ? Specimen: Peritoneal Washings  ?Result Value Ref Range Status  ? Specimen Description PERITONEAL FLUID  Final  ? Special Requests ABDOMEN  Final  ? Gram Stain   Final  ?  WBC PRESENT,BOTH PMN AND MONONUCLEAR ?NO ORGANISMS SEEN ?CYTOSPIN SMEAR ?Performed at [facility], [place] [address], [place], [place] [address] ?  ? Report Status 03/05/2022 FINAL  Final  ?Surgical pcr screen     Status: None  ? Collection Time: 03/08/2022 10:37 AM  ? Specimen: Nasal Mucosa; Nasal Swab  ?Result Value Ref Range Status  ? MRSA, PCR NEGATIVE NEGATIVE Final  ? Staphylococcus aureus NEGATIVE NEGATIVE Final  ?  Comment: (NOTE) ?The Xpert SA Assay (FDA approved for NASAL specimens in patients 60 ?years of age and older), is one component of a comprehensive ?surveillance program. It is not intended to diagnose infection nor to ?guide or monitor treatment. ?Performed at [facility], [place] [address], [place], [place] ?[address] ?  ? ? ? ?Serology: ? ? ? ?Imaging: ?If present, new imagings (plain films, ct scans, and mri) have been personally visualized and interpreted; radiology reports have been reviewed. Decision making incorporated into the  Impression / Recommendations. ? ? ? ?Jabier Mutton, MD ?[facility] for Infectious Disease ?[place] ?[phone] pager    ?03/13/2022, 4:51 PM ? ?

## 2022-03-13 NOTE — Procedures (Addendum)
Central Venous Catheter Insertion Procedure Note  Diane Pruitt  [id number]  1951/01/29  Date:03/13/22  Time:5:55 PM   Provider Performing:Augustin Bun R Clotiel Troop   Procedure: Insertion of Non-tunneled Central Venous [phone]) with US guidance ([id number])   Indication(s) Medication administration  Consent Risks of the procedure as well as the alternatives and risks of each were explained to the patient and/or caregiver.  Consent for the procedure was obtained and is signed in the bedside chart  Anesthesia Topical only with 1% lidocaine   Timeout Verified patient identification, verified procedure, site/side was marked, verified correct patient position, special equipment/implants available, medications/allergies/relevant history reviewed, required imaging and test results available.  Sterile Technique Maximal sterile technique including full sterile barrier drape, hand hygiene, sterile gown, sterile gloves, mask, hair covering, sterile ultrasound probe cover (if used).  Procedure Description Area of catheter insertion was cleaned with chlorhexidine and draped in sterile fashion.  With real-time ultrasound guidance a central venous catheter was placed into the left internal jugular vein. Nonpulsatile blood flow and easy flushing noted in all ports.  The catheter was sutured in place and sterile dressing applied.  Complications/Tolerance None; patient tolerated the procedure well. Chest X-ray is ordered to verify placement for internal jugular or subclavian cannulation.   Chest x-ray is not ordered for femoral cannulation.  EBL Minimal  Specimen(s) None      Diane Carpen Romari Gasparro, PA-C

## 2022-03-13 NOTE — Consult Note (Signed)
Consultation Note Date: 03/13/2022   Patient Name: Diane Pruitt  DOB: 1951/02/23  MRN: [id number]  Age / Sex: 71 y.o., female  PCP: [name], MD Referring Physician: Patrecia Pour, MD  Reason for Consultation: Establishing goals of care  HPI/Patient Profile: 71 y.o. female  with past medical history of liver cirrhosis with ascites, hepatorenal syndrome, portal hypertensive gastropathy, SBP, CVA, paroxysmal atrial fibrillation, CKD stage 4, anemia, L2 kyphoplasty, C. diff infection,  admitted on 03/13/2022 for re-attempt at TIPS procedure completed 03/22/2022. Post-op complications of hypotension and worsening renal function.   Clinical Assessment and Goals of Care: I reviewed records from this hospitalization course, diagnostics, and previous palliative notes. I met today with [name] but no family at bedside she is sleepy but does arouse and make her needs known. We discussed her hypotension and discussed transfer to ICU for vasopressor support. [name] tells me that she agrees and wants interventions as she wants to live. She is too sleepy to discuss further. She gives me permission to call and speak with her daughter.   I revisited at [place] bedside with her daughter and son, [name] and Aaron Edelman. I introduced myself and [name] confirms her mother's wishes to pursue treatment to prolong life although she confirms DNR status. [name] shares that there have been many discussions regarding hospice and her mother has consistently refused hospice with hopes of pursuing interventions to prolong her life. Both children at bedside are aware of poor prognosis.   All questions/concerns addressed. Emotional support provided.   Primary Decision Maker HCPOA daughter [name]    SUMMARY OF RECOMMENDATIONS   - DNR - Desire for life prolonging measures including ICU care and vasopressors  Code Status/Advance Care  Planning: DNR   Symptom Management:  Per attending, PCCM.   Palliative Prophylaxis:  Aspiration, Bowel Regimen, Delirium Protocol, Frequent Pain Assessment, Oral Care, and Turn Reposition  Prognosis:  Overall prognosis poor with end stage liver disease.   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 The following aspects are pertinent.  Past Medical History:  Diagnosis Date   Anemia    Anxiety    Atrial fibrillation (HCC)    Barrett esophagus    Chronic kidney disease (CKD), stage III (moderate) (HCC)    Cirrhosis (HCC)    Depression    Dysrhythmia    Atrial Fibrillation 2 years ago   Hypertension    Ischemic stroke ([place]) 01/25/2018   Social History   Socioeconomic History   Marital status: Divorced    Spouse name: Not on file   Number of children: Not on file   Years of education: Not on file  Highest education level: Not on file  Occupational History   Not on file  Tobacco Use   Smoking status: Never   Smokeless tobacco: Never  Vaping Use   Vaping Use: Never used  Substance and Sexual Activity   Alcohol use: Not Currently    Alcohol/week: 42.0 standard drinks    Types: 38 Shots of liquor per week    Comment: 6 vodkas per day   Drug use: Not Currently   Sexual activity: Not on file  Other Topics Concern   Not on file  Social History Narrative   Not on file   Social Determinants of Health   Financial Resource Strain: Not on file  Food Insecurity: Not on file  Transportation Needs: Not on file  Physical  Activity: Not on file  Stress: Not on file  Social Connections: Not on file   Family History  Problem Relation Age of Onset   Hypertension Mother    Hyperlipidemia Mother    Atrial fibrillation Mother    Hyperlipidemia Father    Atrial fibrillation Father    Scheduled Meds:  allopurinol  200 mg Oral Daily   amiodarone  200 mg Oral BID   iron polysaccharides  150 mg Oral Daily   lactulose  20 g Oral TID   levothyroxine  200 mcg Oral q morning   midodrine  15 mg Oral TID WC   nicotine  14 mg Transdermal Daily   sodium bicarbonate  1,300 mg Oral TID   sodium chloride flush  3 mL Intravenous Q12H   Continuous Infusions:  sodium chloride 100 mL/hr at 03/13/22 1221   albumin human     lactated ringers     PRN Meds:.acetaminophen **OR** acetaminophen, hydrALAZINE, ondansetron **OR** ondansetron (ZOFRAN) IV, oxyCODONE Allergies  Allergen Reactions   Colchicine Other (See Comments)    Stomach upset, diarrhea Other reaction(s): stomach upset   Review of Systems  Unable to perform ROS: Acuity of condition   Physical Exam Vitals and nursing note reviewed.  Constitutional:      General: She is sleeping. She is not in acute distress.    Appearance: She is ill-appearing.  Cardiovascular:     Rate and Rhythm: Normal rate.     Comments: Hypotension  Pulmonary:     Effort: No tachypnea, accessory muscle usage or respiratory distress.  Abdominal:     Palpations: Abdomen is soft.  Neurological:     Mental Status: She is lethargic.    Vital Signs: BP (!) 76/34 (BP Location: Left Arm)   Pulse 84   Temp 97.8 F (36.6 C) (Oral)   Resp 18   Ht '5\' 2"'  (1.575 m)   Wt 78.2 kg   SpO2 95%   BMI 31.53 kg/m  Pain Scale: 0-10   Pain Score: 0-No pain   SpO2: SpO2: 95 % O2 Device:SpO2: 95 % O2 Flow Rate: .O2 Flow Rate (L/min): 4 L/min  IO: Intake/output summary:  Intake/Output Summary (Last 24 hours) at 03/13/2022 1231 Last data filed at 03/13/2022 0600 Gross per 24 hour   Intake 2135.61 ml  Output 1000 ml  Net 1135.61 ml    LBM: Last BM Date : 03/11/22 Baseline Weight: Weight: 75.3 kg Most recent weight: Weight: 78.2 kg     Palliative Assessment/Data:    Time Total: 60 min  Greater than 50%  of this time was spent counseling and coordinating care related to the above assessment and plan.  Signed by: Vinie Sill, NP Palliative Medicine Team Pager #  [phone] (M-F 8a-5p) Team Phone # [phone] (Nights/Weekends)

## 2022-03-13 NOTE — Progress Notes (Signed)
US guided PIV placed in right forearm. End of catheter site marked for application of iWatch once patient is transferred to ICU. USGPIV placed for vasopressor. Please consider PICC or central line for pressor use longer than 72 hours. ? ?Santresa Levett Lorita Officer, RN ? ?

## 2022-03-13 NOTE — Progress Notes (Signed)
Initial Nutrition Assessment ? ?DOCUMENTATION CODES:  ? ?Non-severe (moderate) malnutrition in context of chronic illness ? ?INTERVENTION:  ? ?Ensure Enlive po BID, each supplement provides 350 kcal and 20 grams of protein. ? ?MVI with minerals daily. ? ?Check vitamins B1, B6, B12, folate, and zinc levels as patient is at risk for deficiencies d/t alcoholic liver disease and malnutrition.  ? ?NUTRITION DIAGNOSIS:  ? ?Moderate Malnutrition related to chronic illness (alcoholic liver disease) as evidenced by mild fat depletion, moderate fat depletion, mild muscle depletion. ? ?GOAL:  ? ?Patient will meet greater than or equal to 90% of their needs ? ?MONITOR:  ? ?PO intake, Supplement acceptance, Labs ? ?REASON FOR ASSESSMENT:  ? ?Consult ?Other (Comment) (nutrition goals) ? ?ASSESSMENT:  ? ?71 yo female admitted for TIPS procedure. Found to be C. diff positive.  PMH includes PAF, CVA, CKD stage 3a, alcoholic cirrhosis, hypertensive gastropathy, hepatorenal syndrome, Barrett's esophagus. ? ?Patient unable to provide any nutrition hx or answer any of RD's questions today.  ?S/P TIPS 0/16 for alcoholic cirrhosis with portal HTN and refractory ascites.  ?With alcoholic liver disease, patient is at risk for deficiencies of vitamins B1, B6, B12, folate and zinc. ? ?Labs reviewed. Na 131 (improving, but remains low d/t volume excess/ascites) ? ?Medications reviewed and include Niferex, sodium bicarb. ?IVF: LR at 75 ml/h. ? ?Weight history reviewed.  ?Weight has been trending down since 01/06/22, up until this admission, now trending back up r/t volume overload.  ?Dry weight unknown. ? ?Patient is moderately malnourished with mild-moderate depletion of muscle and subcutaneous fat mass.  ? ?NUTRITION - FOCUSED PHYSICAL EXAM: ? ?Flowsheet Row Most Recent Value  ?Orbital Region No depletion  ?Upper Arm Region Mild depletion  ?Thoracic and Lumbar Region Moderate depletion  ?Buccal Region No depletion  ?Temple Region Mild  depletion  ?Clavicle Bone Region Mild depletion  ?Clavicle and Acromion Bone Region Mild depletion  ?Scapular Bone Region Mild depletion  ?Dorsal Hand Mild depletion  ?Patellar Region No depletion  ?Anterior Thigh Region Mild depletion  ?Posterior Calf Region Mild depletion  ?Edema (RD Assessment) Moderate  ?Hair Reviewed  ?Eyes Reviewed  ?Mouth Reviewed  ?Skin Reviewed  ?Nails Reviewed  ? ?  ? ? ?Diet Order:   ?Diet Order   ? ?       ?  Diet regular Room service appropriate? Yes; Fluid consistency: Thin  Diet effective now       ?  ? ?  ?  ? ?  ? ? ?EDUCATION NEEDS:  ? ?Not appropriate for education at this time ? ?Skin:  Skin Assessment: Reviewed RN Assessment ? ?Last BM:  5/14 ? ?Height:  ? ?Ht Readings from Last 1 Encounters:  ?02/26/2022 '5\' 2"'$  (1.575 m)  ? ? ?Weight:  ? ?Wt Readings from Last 1 Encounters:  ?03/22/2022 78.2 kg  ? ? ?Ideal Body Weight:  50 kg ? ?BMI:  Body mass index is 31.53 kg/m?. ? ?Estimated Nutritional Needs:  ? ?Kcal:  1700-1900 ? ?Protein:  85-100 gm ? ?Fluid:  1.7-1.9 L ? ? ? ?Lucas Mallow RD, LDN, CNSC ?Please refer to Amion for contact information.                                                       ? ?

## 2022-03-13 NOTE — Progress Notes (Addendum)
Notified of increased pressor needs ?When RN started her shift. Patient was on 21 mic of Levophed ?She is now on 20 mic/min Levophed ?SBP 96 ?No overt symptoms, no current diarrhea  ?Had a LA ordered for 6.59 pm, not resulted yet ?Reviewed note from CCM and ID  ? ?Plan: ?Start antibiotics given worsening shock ?Prophylactic oral vanc per ID added as well  ?Check lactate ?Give albumin early (ws due around midnight, will give the dose now) ?Also wrote for vasopressin in case pressor needs rise again ?CVP checked was 8 ?Have d/w RN  ? ?Addendum at 3:15 am ?Hypotensive despite 40 mic levophed and vasopressin  ?Albumin x 1 ?Changing the titration on levophed to allow higher dose ?Repeat LA pending ?Also belly fully benign on exam on RN palpation. Soft and patient with no discomfort on palpation. Last Hemoglobin was 8.3 ? ?Addendum at 4 am ?Have seen patient a couple times in the last hour and a half ?Despite albumin, increased dose of pressors, she remains hypotensive ?Since she has been so unstable, a CT of the belly cannot be done either ?She continues to have a non-tender soft abdomen and no oozing from access sites  ?I called and spoke with her daughter, [name], who is a pediatrics NP ?She is on her way in and so is her brother ?There are 2 other brothers who live out of state ?I was very clear that Storm[place]'s condition has declined significantly in the last couple hours and I am concerned that she may not survive the night ?[name] is aware that TIPS was also performed as a procedure to alleviate symptoms and not as a curative measure ?She knows her mother is in multi organ failure ?We are going to add Epinephrine and increase and max out the vaso dose as well, family is on their way in  ?She reiterated that her mother knew of her underlying condition and wanted to be DNR and did not think she would ever want dialysis ?Fresh labs have ben sent and are pending ?D/w bedside RN as well  ? ?Addendum at 4:10 am ?Noted  labs ?Hemoglobin stable at 8.1 (8.3 last, and was 8,7 a few days ago). Platelets ok ?Bicarb dropped to 11 and likely from rising LA, worsening AKI  ?Push bicarb IV and will add on a bicarb drip to see if this helps [place]  ?Not using steroids given recent colitis and belly issues  ?

## 2022-03-14 ENCOUNTER — Inpatient Hospital Stay (HOSPITAL_COMMUNITY): Payer: Medicare HMO

## 2022-03-14 ENCOUNTER — Telehealth (HOSPITAL_COMMUNITY): Payer: Self-pay | Admitting: Interventional Radiology

## 2022-03-14 LAB — COMPREHENSIVE METABOLIC PANEL
ALT: 139 U/L — ABNORMAL HIGH (ref 0–44)
AST: 619 U/L — ABNORMAL HIGH (ref 15–41)
Albumin: 3.5 g/dL (ref 3.5–5.0)
Alkaline Phosphatase: 138 U/L — ABNORMAL HIGH (ref 38–126)
Anion gap: 18 — ABNORMAL HIGH (ref 5–15)
BUN: 107 mg/dL — ABNORMAL HIGH (ref 8–23)
CO2: 11 mmol/L — ABNORMAL LOW (ref 22–32)
Calcium: 8.4 mg/dL — ABNORMAL LOW (ref 8.9–10.3)
Chloride: 100 mmol/L (ref 98–111)
Creatinine, Ser: 3.66 mg/dL — ABNORMAL HIGH (ref 0.44–1.00)
GFR, Estimated: 13 mL/min — ABNORMAL LOW (ref >60)
Glucose, Bld: 94 mg/dL (ref 70–99)
Potassium: 4.9 mmol/L (ref 3.5–5.1)
Sodium: 129 mmol/L — ABNORMAL LOW (ref 135–145)
Total Bilirubin: 3.4 mg/dL — ABNORMAL HIGH (ref 0.3–1.2)
Total Protein: 6.1 g/dL — ABNORMAL LOW (ref 6.5–8.1)

## 2022-03-14 LAB — BLOOD CULTURE ID PANEL (REFLEXED) - BCID2

## 2022-03-14 LAB — CBC WITH DIFFERENTIAL/PLATELET
Abs Immature Granulocytes: 0.23 K/uL — ABNORMAL HIGH (ref 0.00–0.07)
Basophils Absolute: 0 K/uL (ref 0.0–0.1)
Basophils Relative: 0 %
Eosinophils Absolute: 0.1 K/uL (ref 0.0–0.5)
Eosinophils Relative: 1 %
HCT: 24.4 % — ABNORMAL LOW (ref 36.0–46.0)
Hemoglobin: 8.1 g/dL — ABNORMAL LOW (ref 12.0–15.0)
Immature Granulocytes: 1 %
Lymphocytes Relative: 3 %
Lymphs Abs: 0.8 K/uL (ref 0.7–4.0)
MCH: 27.6 pg (ref 26.0–34.0)
MCHC: 33.2 g/dL (ref 30.0–36.0)
MCV: 83 fL (ref 80.0–100.0)
Monocytes Absolute: 1.1 K/uL — ABNORMAL HIGH (ref 0.1–1.0)
Monocytes Relative: 5 %
Neutro Abs: 21.8 K/uL — ABNORMAL HIGH (ref 1.7–7.7)
Neutrophils Relative %: 90 %
Platelets: 237 K/uL (ref 150–400)
RBC: 2.94 MIL/uL — ABNORMAL LOW (ref 3.87–5.11)
RDW: 25.5 % — ABNORMAL HIGH (ref 11.5–15.5)
WBC: 24.1 K/uL — ABNORMAL HIGH (ref 4.0–10.5)
nRBC: 0.4 % — ABNORMAL HIGH (ref 0.0–0.2)

## 2022-03-14 LAB — ZINC: Zinc: 29 ug/dL — ABNORMAL LOW (ref 44–115)

## 2022-03-14 LAB — LACTIC ACID, PLASMA: Lactic Acid, Venous: 4.9 mmol/L (ref 0.5–1.9)

## 2022-03-14 LAB — GLUCOSE, CAPILLARY: Glucose-Capillary: 86 mg/dL (ref 70–99)

## 2022-03-14 LAB — MAGNESIUM: Magnesium: 2.3 mg/dL (ref 1.7–2.4)

## 2022-03-14 LAB — PROCALCITONIN: Procalcitonin: 1.67 ng/mL

## 2022-03-14 MED ORDER — SODIUM BICARBONATE 8.4 % IV SOLN
100.0000 meq | Freq: Once | INTRAVENOUS | Status: AC
  Administered 2022-03-14: 100 meq via INTRAVENOUS

## 2022-03-14 MED ORDER — SODIUM BICARBONATE 8.4 % IV SOLN
INTRAVENOUS | Status: DC
  Filled 2022-03-14: qty 1000

## 2022-03-14 MED ORDER — SODIUM BICARBONATE 8.4 % IV SOLN
INTRAVENOUS | Status: AC
  Filled 2022-03-14: qty 100

## 2022-03-14 MED ORDER — EPINEPHRINE HCL 5 MG/250ML IV SOLN IN NS
INTRAVENOUS | Status: AC
  Filled 2022-03-14: qty 250

## 2022-03-14 MED ORDER — ALBUMIN HUMAN 25 % IV SOLN
12.5000 g | Freq: Once | INTRAVENOUS | Status: AC
  Administered 2022-03-14: 12.5 g via INTRAVENOUS
  Filled 2022-03-14: qty 50

## 2022-03-14 MED ORDER — EPINEPHRINE HCL 5 MG/250ML IV SOLN IN NS
0.5000 ug/min | INTRAVENOUS | Status: DC
  Administered 2022-03-14: 2 ug/min via INTRAVENOUS

## 2022-03-15 LAB — VITAMIN B1: Vitamin B1 (Thiamine): 190.9 nmol/L (ref 66.5–200.0)

## 2022-03-16 LAB — VITAMIN B6: Vitamin B6: 2.1 ug/L — ABNORMAL LOW (ref 3.4–65.2)

## 2022-03-16 LAB — CULTURE, BLOOD (ROUTINE X 2): Special Requests: ADEQUATE

## 2022-03-17 LAB — CULTURE, BLOOD (ROUTINE X 2): Special Requests: ADEQUATE

## 2022-03-19 LAB — MISC LABCORP TEST (SEND OUT): Labcorp test code: 9985

## 2022-03-29 NOTE — Death Summary Note (Signed)
   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    Hospital Course: [name] is a 71 year old woman with a history of hypertension, atrial fibrillation, prior ischemic stroke, CKD, anxiety, depression, EtOH cirrhosis with portal hypertension who presented to [facility] on 5/15 for a planned TIPS procedure. Recently admitted to [facility] 4/25 for hepatorenal syndrome and found to be C. Diff positive with concern for SBP (s/p Cipro course), TIPS was attempted 4/29 but aborted in the setting of challenging portal vein anatomy. Discharged to SNF 5/10.  She underwent TIPS with IR on 5/15 successfully with reduction in gradient from 15-8.  She additionally had a therapeutic paracentesis which removed 2.2 L.  Postprocedure she was noted to be hypotensive with systolic blood pressures in the 70s and 80s.  She was admitted to the ICU requiring peripheral Levophed.  Overnight unfortunately she had increasing pressor need started on Levophed, vasopressin and epinephrine.  She was also given albumin and started on bicarb gtt.  She is on  broad-spectrum antibiotics and prophylactic oral vancomycin as well.  Unfortunately she remained hypotensive despite maximum doses of multiple pressors.  Patient passed away at 5:22 AM.  Signed: Younes Degeorge N, DO 03-25-22, 10:47 AM

## 2022-03-29 NOTE — Progress Notes (Deleted)
? ?  NAME:  [name], MRN:  [id number], DOB:  [birth year], LOS: 2 ?ADMISSION DATE:  03/25/2022, CONSULTATION DATE:  5/15 ?REFERRING MD:  TRH CHIEF COMPLAINT:  Hypotension s/p TIPS  ? ? ?Date of Admission: 03/10/2022  9:53 AM ?Date of Discharge: 04-08-2022 ? ?Discharge Diagnosis: ?Principal Problem: ?  Shock ([place]) ?Active Problems: ?  Paroxysmal atrial fibrillation ([place]) ?  Acquired hypothyroidism ?  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  Portal hypertensive gastropathy ([facility]) ?  Hypotension ?  Orthostatic hypotension dysautonomic syndrome ?  Clostridium difficile infection ?  Hepatorenal syndrome ([place]) ?  DNR (do not resuscitate) ?  Malnutrition of moderate degree ?  Pressure injury of skin ? ? ?Cause of death: Multiorgan failure, septic shock ?Time of death: 43 ? ?Disposition and follow-up:   ?Ms.Yeslin Delio was discharged from [facility] in expired condition.   ? ?Hospital Course: ?Ms. Diane Pruitt is a 71 year old woman with a history of hypertension, atrial fibrillation, prior ischemic stroke, CKD, anxiety, depression, EtOH cirrhosis with portal hypertension who presented to [facility] on 5/15 for a planned TIPS procedure. Recently admitted to [facility] 4/25 for hepatorenal syndrome and found to be C. Diff positive with concern for SBP (s/p Cipro course), TIPS was attempted 4/29 but aborted in the setting of challenging portal vein anatomy. Discharged to SNF 5/10.  She underwent TIPS with IR on 5/15 successfully with reduction in gradient from 15-8.  She additionally had a therapeutic paracentesis which removed 2.2 L.  Postprocedure she was noted to be hypotensive with systolic blood pressures in the 70s and 80s.  She was admitted to the ICU requiring peripheral Levophed.  Overnight unfortunately she had increasing pressor need started on Levophed, vasopressin and epinephrine.  She was also given albumin and started on bicarb gtt.  She is on broad-spectrum  antibiotics and prophylactic oral vancomycin as well.  Unfortunately she remained hypotensive despite maximum doses of multiple pressors.  Patient passed away at 5:22 AM. ? ?Signed: ?Azayla Polo N, DO ?2022-04-08, 10:45 AM  ?

## 2022-03-29 NOTE — Progress Notes (Signed)
Vascular and Interventional Radiology  ?Courtesy Phone Note ? ?Patient: Diane Pruitt ?DOB: 1950-12-05 ?Medical Record Number: [id number] ?Note Date/Time: 04/11/22 9:05 AM  ? ?Diagnosis: EtOH cirrhosis with pHTN and refractory ascites. Hepatorenal syndrome.  ? ?I identified myself to the patient and conveyed my credentials to Pt NOK and POA, Ms. Arville Go (Daughter). ? ?Assessment  Plan: ?71 y.o. year old female well known to me and the VIR Team s/p repeat attempt at TIPS on 03/08/2022.  ?MD was made aware of her unfortunate passing early this AM.  ? ?Called to offer my condolences to her Daughter on behalf of the [place] and [facility] Teams. ? ?Thank you for allowing [place] to participate in the care of this Patient. ? ? ?Michaelle Birks, MD ?[facility] ?[facility] ? ? ?Pager. [phone] ?Clinic. [phone]  ?

## 2022-03-29 DEATH — deceased

## 2022-04-28 NOTE — Progress Notes (Signed)
Please send Coding query to Dr. Ina Homes who took care of the patient

## 2022-08-18 IMAGING — CT CT ABD-PELV W/ CM
2 of 5 series · 16 of 46 positions shown, 18 images · IV contrast (agent unspecified)
Comparison: None.

CLINICAL DATA: Lower abdominal pain.

EXAM:
CT ABDOMEN AND PELVIS WITH CONTRAST
TECHNIQUE: Multidetector CT imaging of the abdomen and pelvis was performed
using the standard protocol following bolus administration of
intravenous contrast.

[Series 2: abd pel w · axial · 0.86mm/px · z∈[+708,+1118]mm · 13 of 94 slices shown, 15 images]
[im 6/94  soft-tissue]
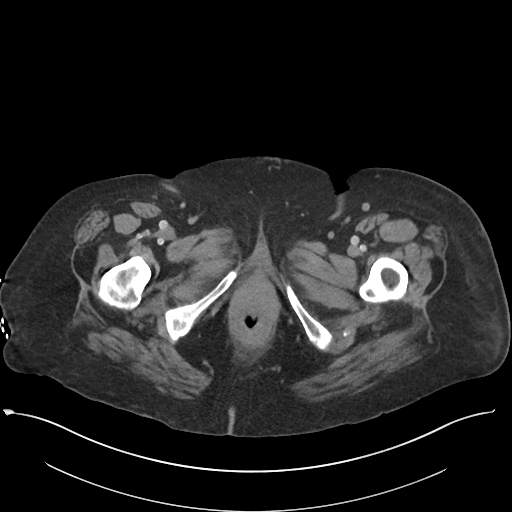
[im 6/94  bone]
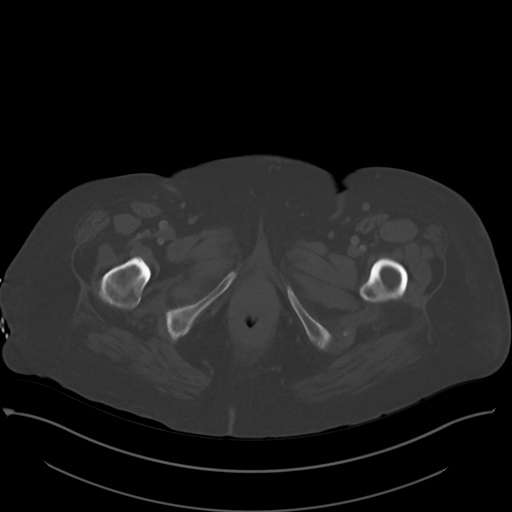
[im 11/94  soft-tissue]
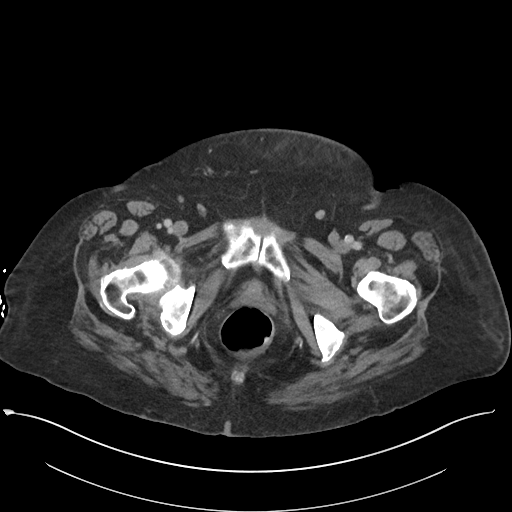
[im 21/94  soft-tissue]
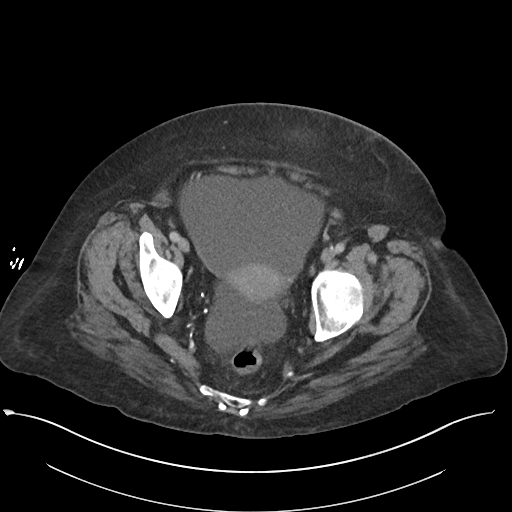
[im 26/94  soft-tissue]
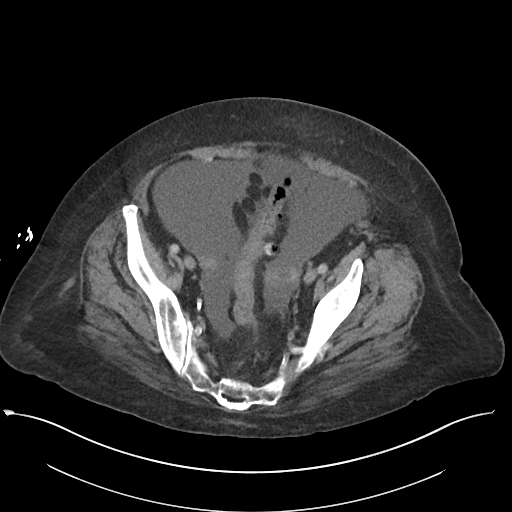
[im 32/94  soft-tissue]
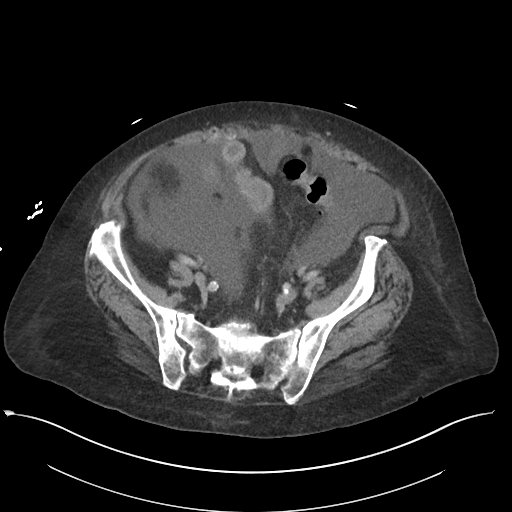
[im 42/94  soft-tissue]
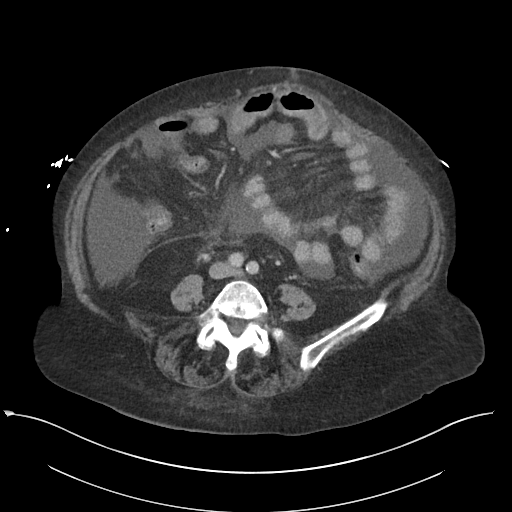
[im 47/94  soft-tissue]
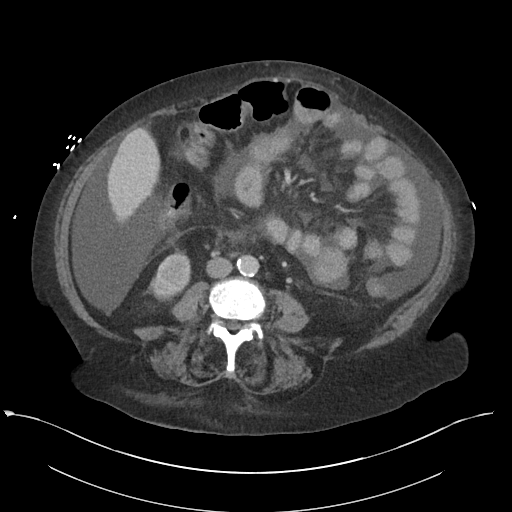
[im 52/94  soft-tissue]
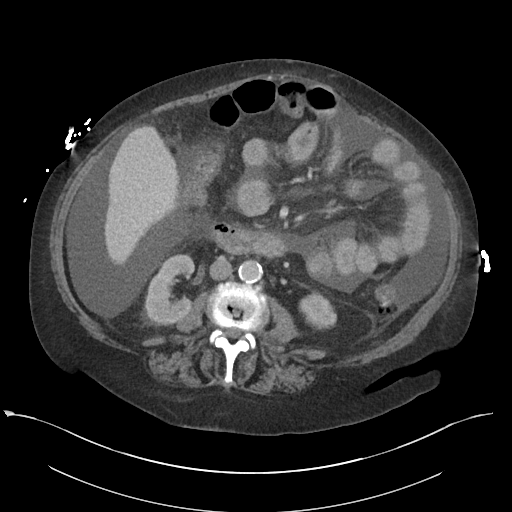
[im 63/94  soft-tissue]
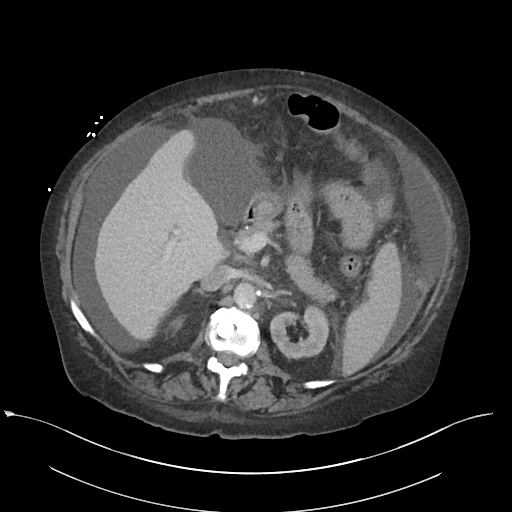
[im 63/94  bone]
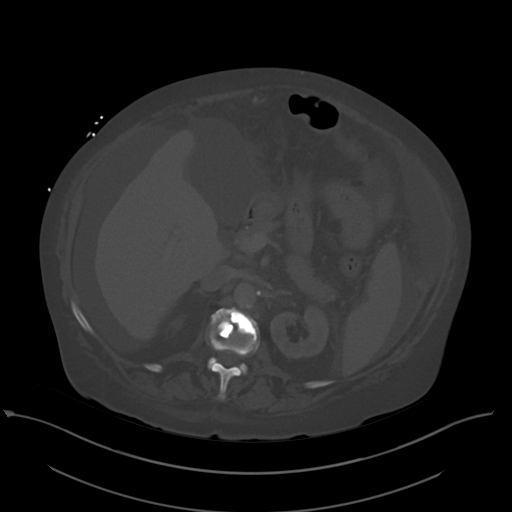
[im 68/94  soft-tissue]
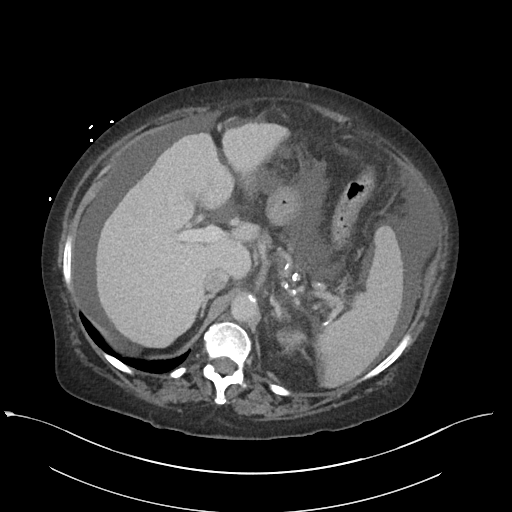
[im 73/94  soft-tissue]
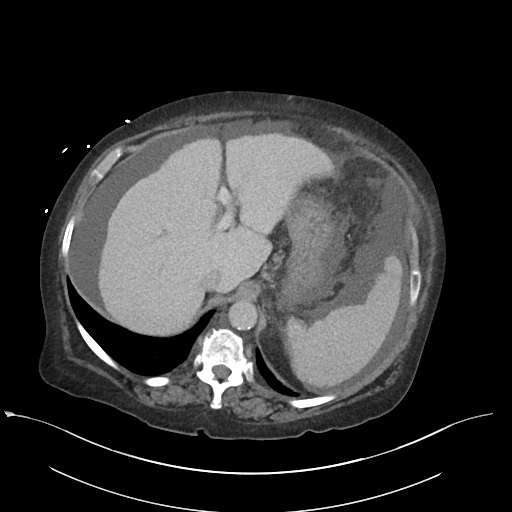
[im 83/94  soft-tissue]
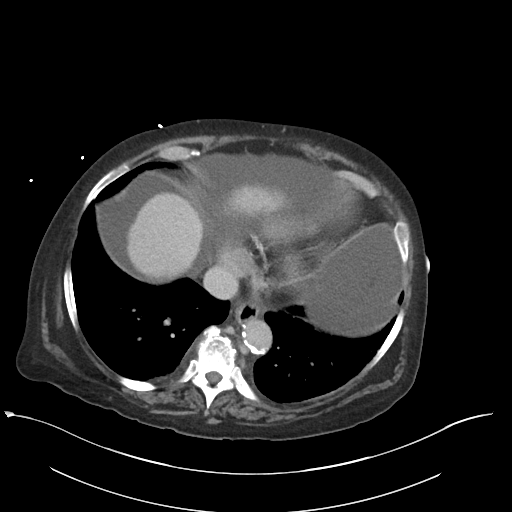
[im 88/94  soft-tissue]
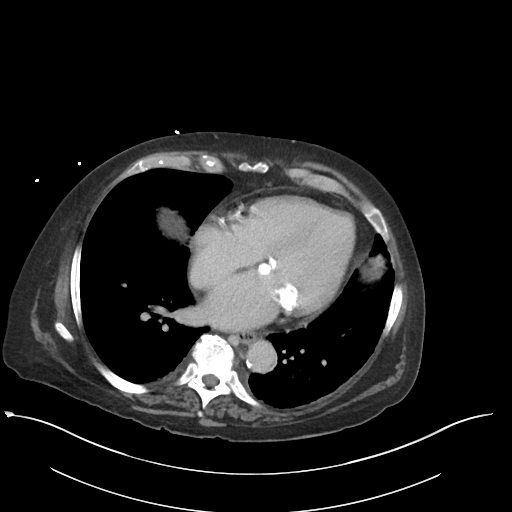

[Series 5: coronal · coronal · 0.87mm/px · 3 of 118 slices shown]
[im 40/118  soft-tissue]
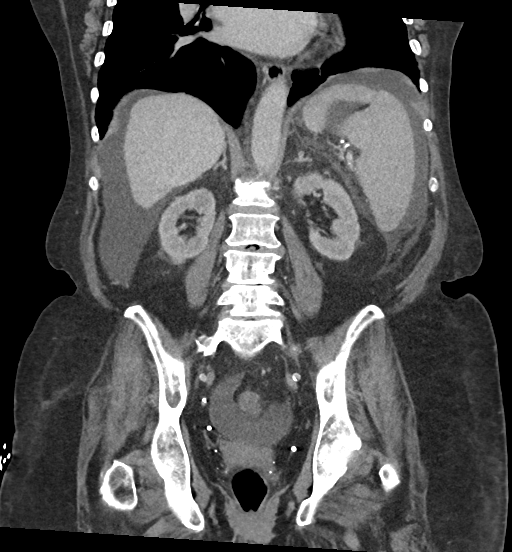
[im 53/118  soft-tissue]
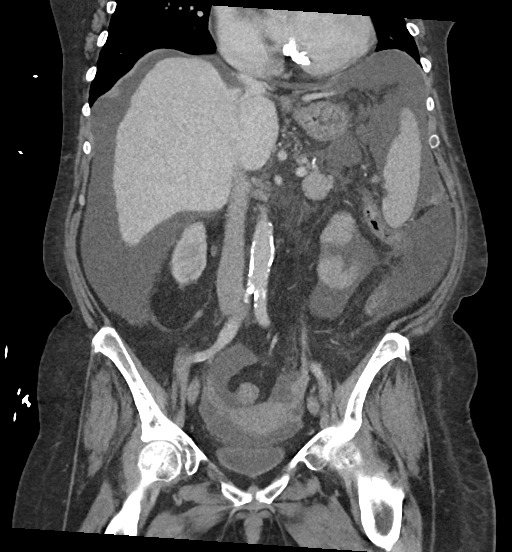
[im 66/118  soft-tissue]
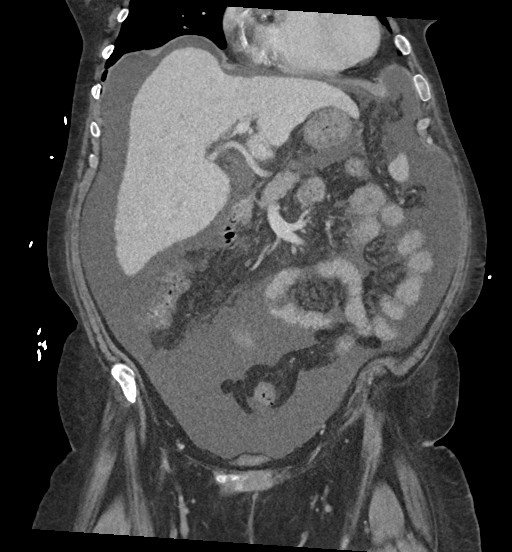

[16 of 46 positions shown; findings below may reference images not displayed]

RADIATION DOSE REDUCTION: This exam was performed according to the
departmental dose-optimization program which includes automated
exposure control, adjustment of the mA and/or kV according to
patient size and/or use of iterative reconstruction technique.

CONTRAST:  80mL OMNIPAQUE IOHEXOL 300 MG/ML  SOLN
FINDINGS: Lower chest: No acute abnormality.

Hepatobiliary: No gallstones or biliary dilatation is noted. Nodular
hepatic contours are noted consistent with hepatic cirrhosis. No
focal hepatic abnormality is noted.

Pancreas: Unremarkable. No pancreatic ductal dilatation or
surrounding inflammatory changes.

Spleen: Mild splenomegaly is noted.

Adrenals/Urinary Tract: Adrenal glands are unremarkable. Kidneys are
normal, without renal calculi, focal lesion, or hydronephrosis.
Bladder is unremarkable.

Stomach/Bowel: Stomach is within normal limits. Appendix appears
normal. No evidence of bowel wall thickening, distention, or
inflammatory changes. Sigmoid diverticulosis is noted without
inflammation.

Vascular/Lymphatic: Aortic atherosclerosis. No enlarged abdominal or
pelvic lymph nodes.

Reproductive: Uterus and bilateral adnexa are unremarkable.

Other: Moderate ascites is noted.  No hernia is noted.

Musculoskeletal: Status post L2 kyphoplasty. No acute osseous
abnormality is noted.
IMPRESSION: Hepatic cirrhosis is noted with mild splenomegaly and moderate
ascites.

Sigmoid diverticulosis without inflammation.

Aortic Atherosclerosis ([id number].D).

## 2022-08-19 IMAGING — US US PARACENTESIS
1 series · 6 of 6 positions shown · non-contrast
Comparison: none

INDICATION: Ascites, abdominal pain, cirrhosis

[Series 1: us paracentesis mc & wl · 6 of 6 slices shown]
[im 1/6]
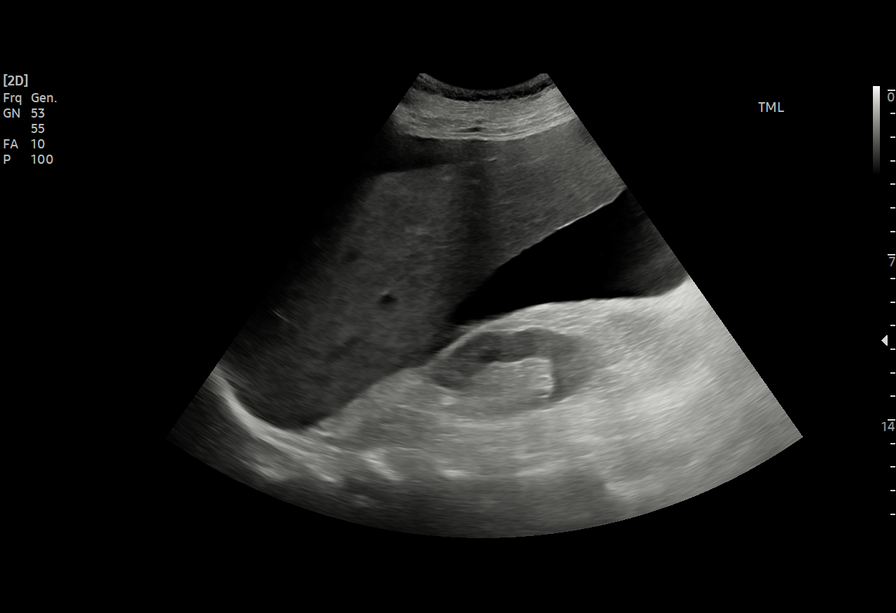
[im 2/6]
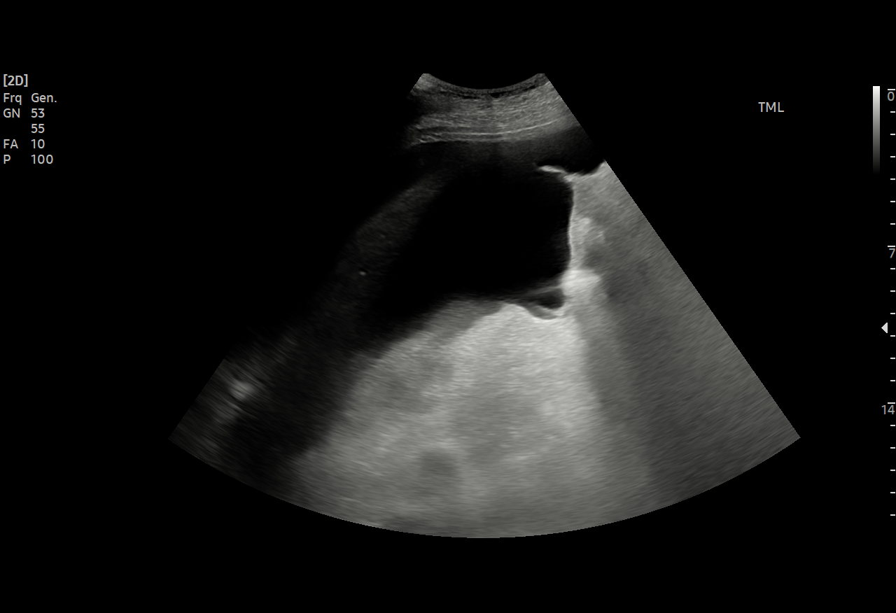
[im 3/6]
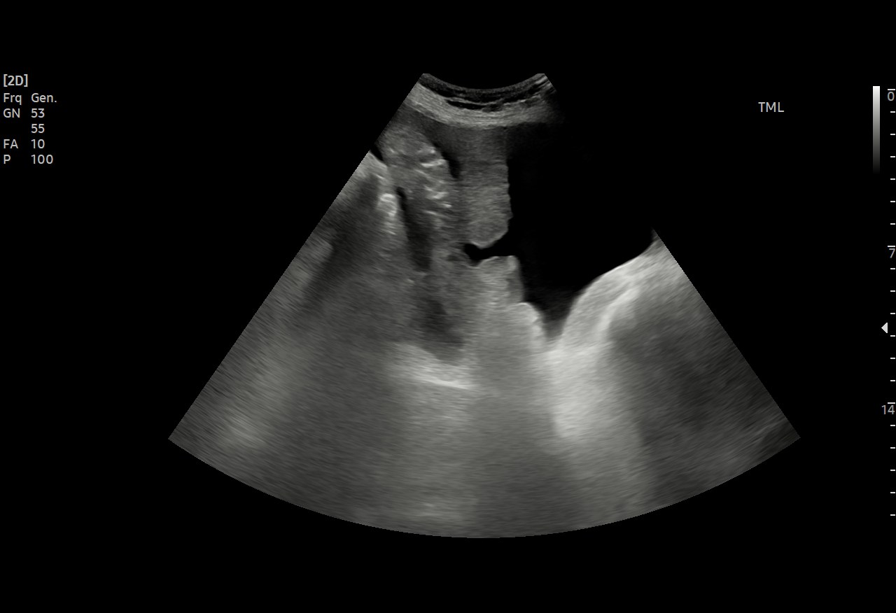
[im 4/6]
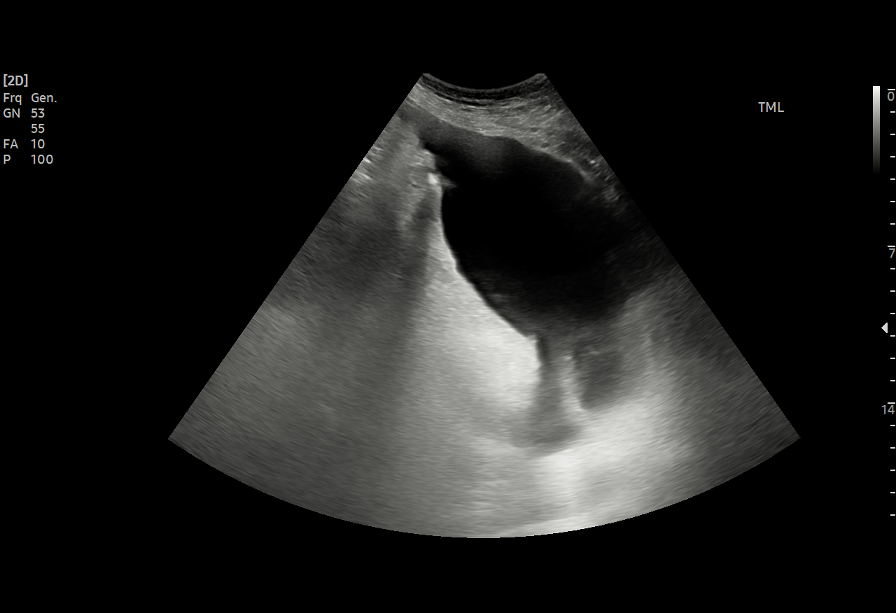
[im 5/6]
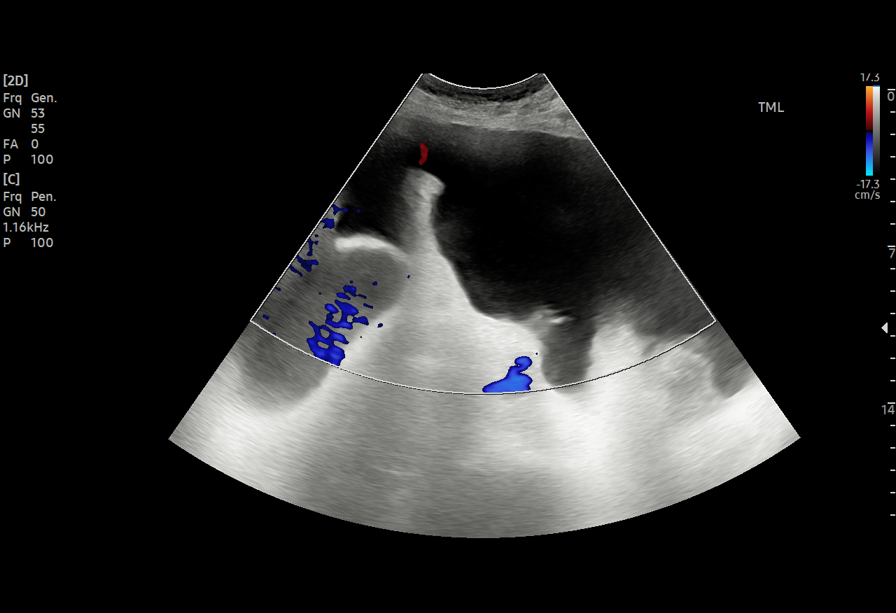
[im 6/6]
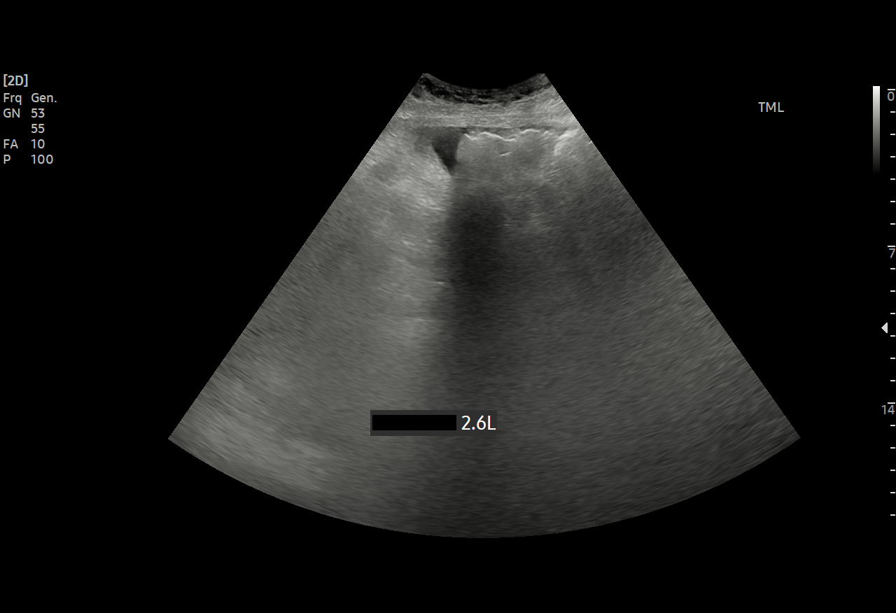

[6 of 6 positions shown; findings below may reference images not displayed]

EXAM:
ULTRASOUND GUIDED RLQ PARACENTESIS

MEDICATIONS:
None.

COMPLICATIONS:
None immediate.

PROCEDURE:
Informed written consent was obtained from the patient after a
discussion of the risks, benefits and alternatives to treatment. A
timeout was performed prior to the initiation of the procedure.

Initial ultrasound scanning demonstrates a large amount of ascites
within the right lower abdominal quadrant. The right lower abdomen
was prepped and draped in the usual sterile fashion. 1% lidocaine
was used for local anesthesia.

Following this, a 19 gauge, 7-cm, Yueh catheter was introduced. An
ultrasound image was saved for documentation purposes. The
paracentesis was performed. The catheter was removed and a dressing
was applied. The patient tolerated the procedure well without
immediate post procedural complication.
FINDINGS: A total of approximately 2.6L of yellow peritoneal fluid was
removed. Samples were sent to the laboratory as requested by the
clinical team.
IMPRESSION: Successful ultrasound-guided paracentesis yielding 2.6 liters of
peritoneal fluid.
